# Patient Record
Sex: Male | Born: 1960 | Race: Black or African American | Hispanic: No | Marital: Single | Smoking: Current every day smoker
Health system: Southern US, Community
[De-identification: ages and names within clinical notes are randomized; demographics above are authoritative.]

## PROBLEM LIST (undated history)

## (undated) DIAGNOSIS — G8929 Other chronic pain: Secondary | ICD-10-CM

## (undated) DIAGNOSIS — J449 Chronic obstructive pulmonary disease, unspecified: Secondary | ICD-10-CM

## (undated) DIAGNOSIS — M549 Dorsalgia, unspecified: Secondary | ICD-10-CM

## (undated) DIAGNOSIS — M25569 Pain in unspecified knee: Secondary | ICD-10-CM

## (undated) DIAGNOSIS — F1010 Alcohol abuse, uncomplicated: Secondary | ICD-10-CM

## (undated) DIAGNOSIS — J40 Bronchitis, not specified as acute or chronic: Secondary | ICD-10-CM

## (undated) DIAGNOSIS — I10 Essential (primary) hypertension: Secondary | ICD-10-CM

---

## 1999-04-04 ENCOUNTER — Emergency Department (HOSPITAL_COMMUNITY): Admission: EM | Admit: 1999-04-04 | Discharge: 1999-04-04 | Payer: Self-pay | Admitting: Emergency Medicine

## 2000-04-20 ENCOUNTER — Emergency Department (HOSPITAL_COMMUNITY): Admission: EM | Admit: 2000-04-20 | Discharge: 2000-04-21 | Payer: Self-pay | Admitting: Internal Medicine

## 2001-02-27 ENCOUNTER — Emergency Department (HOSPITAL_COMMUNITY): Admission: EM | Admit: 2001-02-27 | Discharge: 2001-02-27 | Payer: Self-pay | Admitting: Emergency Medicine

## 2001-02-27 ENCOUNTER — Encounter: Payer: Self-pay | Admitting: Emergency Medicine

## 2001-03-06 ENCOUNTER — Emergency Department (HOSPITAL_COMMUNITY): Admission: EM | Admit: 2001-03-06 | Discharge: 2001-03-06 | Payer: Self-pay | Admitting: Emergency Medicine

## 2001-09-29 ENCOUNTER — Emergency Department (HOSPITAL_COMMUNITY): Admission: EM | Admit: 2001-09-29 | Discharge: 2001-09-29 | Payer: Self-pay

## 2002-05-04 ENCOUNTER — Emergency Department (HOSPITAL_COMMUNITY): Admission: EM | Admit: 2002-05-04 | Discharge: 2002-05-04 | Payer: Self-pay | Admitting: Emergency Medicine

## 2002-05-28 ENCOUNTER — Emergency Department (HOSPITAL_COMMUNITY): Admission: EM | Admit: 2002-05-28 | Discharge: 2002-05-29 | Payer: Self-pay | Admitting: Emergency Medicine

## 2002-05-29 ENCOUNTER — Encounter: Payer: Self-pay | Admitting: Emergency Medicine

## 2003-07-18 ENCOUNTER — Emergency Department (HOSPITAL_COMMUNITY): Admission: EM | Admit: 2003-07-18 | Discharge: 2003-07-18 | Payer: Self-pay | Admitting: Emergency Medicine

## 2004-02-19 ENCOUNTER — Emergency Department (HOSPITAL_COMMUNITY): Admission: EM | Admit: 2004-02-19 | Discharge: 2004-02-19 | Payer: Self-pay | Admitting: Emergency Medicine

## 2004-03-02 ENCOUNTER — Emergency Department (HOSPITAL_COMMUNITY): Admission: EM | Admit: 2004-03-02 | Discharge: 2004-03-03 | Payer: Self-pay | Admitting: Emergency Medicine

## 2004-07-31 ENCOUNTER — Emergency Department (HOSPITAL_COMMUNITY): Admission: EM | Admit: 2004-07-31 | Discharge: 2004-07-31 | Payer: Self-pay | Admitting: Emergency Medicine

## 2005-09-17 ENCOUNTER — Emergency Department (HOSPITAL_COMMUNITY): Admission: EM | Admit: 2005-09-17 | Discharge: 2005-09-18 | Payer: Self-pay | Admitting: Emergency Medicine

## 2005-09-19 ENCOUNTER — Emergency Department (HOSPITAL_COMMUNITY): Admission: EM | Admit: 2005-09-19 | Discharge: 2005-09-19 | Payer: Self-pay | Admitting: Family Medicine

## 2006-02-21 ENCOUNTER — Emergency Department (HOSPITAL_COMMUNITY): Admission: EM | Admit: 2006-02-21 | Discharge: 2006-02-21 | Payer: Self-pay | Admitting: Emergency Medicine

## 2006-10-10 ENCOUNTER — Emergency Department (HOSPITAL_COMMUNITY): Admission: EM | Admit: 2006-10-10 | Discharge: 2006-10-10 | Payer: Self-pay | Admitting: Emergency Medicine

## 2006-11-04 ENCOUNTER — Emergency Department (HOSPITAL_COMMUNITY): Admission: EM | Admit: 2006-11-04 | Discharge: 2006-11-04 | Payer: Self-pay | Admitting: Emergency Medicine

## 2008-05-14 ENCOUNTER — Emergency Department (HOSPITAL_COMMUNITY): Admission: EM | Admit: 2008-05-14 | Discharge: 2008-05-14 | Payer: Self-pay | Admitting: Emergency Medicine

## 2009-06-22 ENCOUNTER — Emergency Department (HOSPITAL_COMMUNITY): Admission: EM | Admit: 2009-06-22 | Discharge: 2009-06-22 | Payer: Self-pay | Admitting: Emergency Medicine

## 2009-08-02 ENCOUNTER — Emergency Department (HOSPITAL_COMMUNITY): Admission: EM | Admit: 2009-08-02 | Discharge: 2009-08-02 | Payer: Self-pay | Admitting: Emergency Medicine

## 2010-01-12 ENCOUNTER — Emergency Department (HOSPITAL_COMMUNITY): Admission: EM | Admit: 2010-01-12 | Discharge: 2010-01-12 | Payer: Self-pay | Admitting: Emergency Medicine

## 2010-09-03 LAB — CBC
Hemoglobin: 13 g/dL (ref 13.0–17.0)
MCH: 35.3 pg — ABNORMAL HIGH (ref 26.0–34.0)
MCV: 101.8 fL — ABNORMAL HIGH (ref 78.0–100.0)
RBC: 3.69 MIL/uL — ABNORMAL LOW (ref 4.22–5.81)
RDW: 14.9 % (ref 11.5–15.5)
WBC: 4.6 10*3/uL (ref 4.0–10.5)

## 2010-09-03 LAB — DIFFERENTIAL
Basophils Absolute: 0.1 10*3/uL (ref 0.0–0.1)
Basophils Relative: 1 % (ref 0–1)
Lymphocytes Relative: 48 % — ABNORMAL HIGH (ref 12–46)
Lymphs Abs: 2.2 10*3/uL (ref 0.7–4.0)
Neutro Abs: 1.8 10*3/uL (ref 1.7–7.7)
Neutrophils Relative %: 39 % — ABNORMAL LOW (ref 43–77)

## 2010-09-03 LAB — BASIC METABOLIC PANEL
CO2: 22 mEq/L (ref 19–32)
Calcium: 8.8 mg/dL (ref 8.4–10.5)
Chloride: 110 mEq/L (ref 96–112)
GFR calc non Af Amer: >60 mL/min (ref >60)
Glucose, Bld: 94 mg/dL (ref 70–99)

## 2010-09-03 LAB — ETHANOL: Alcohol, Ethyl (B): 273 mg/dL — ABNORMAL HIGH (ref 0–10)

## 2010-09-04 LAB — RAPID URINE DRUG SCREEN, HOSP PERFORMED
Amphetamines: NOT DETECTED
Barbiturates: NOT DETECTED
Tetrahydrocannabinol: NOT DETECTED

## 2010-09-04 LAB — CBC
HCT: 39.8 % (ref 39.0–52.0)
Hemoglobin: 13.6 g/dL (ref 13.0–17.0)
MCHC: 34.3 g/dL (ref 30.0–36.0)
MCV: 100.3 fL — ABNORMAL HIGH (ref 78.0–100.0)
Platelets: 205 10*3/uL (ref 150–400)

## 2010-09-04 LAB — BASIC METABOLIC PANEL
BUN: 18 mg/dL (ref 6–23)
CO2: 23 mEq/L (ref 19–32)
GFR calc non Af Amer: 48 mL/min — ABNORMAL LOW (ref >60)
Potassium: 5 mEq/L (ref 3.5–5.1)

## 2010-09-04 LAB — DIFFERENTIAL
Basophils Relative: 1 % (ref 0–1)
Eosinophils Relative: 1 % (ref 0–5)
Monocytes Relative: 7 % (ref 3–12)
Neutro Abs: 2.3 10*3/uL (ref 1.7–7.7)
Neutrophils Relative %: 45 % (ref 43–77)

## 2010-09-04 LAB — ETHANOL: Alcohol, Ethyl (B): 289 mg/dL — ABNORMAL HIGH (ref 0–10)

## 2011-01-24 ENCOUNTER — Emergency Department (HOSPITAL_COMMUNITY)
Admission: EM | Admit: 2011-01-24 | Discharge: 2011-01-25 | Disposition: A | Discharge status: Home / Self Care | Payer: Self-pay | Attending: Emergency Medicine | Admitting: Emergency Medicine

## 2011-01-24 DIAGNOSIS — F3289 Other specified depressive episodes: Secondary | ICD-10-CM | POA: Insufficient documentation

## 2011-01-24 DIAGNOSIS — F1010 Alcohol abuse, uncomplicated: Secondary | ICD-10-CM | POA: Insufficient documentation

## 2011-01-24 DIAGNOSIS — F329 Major depressive disorder, single episode, unspecified: Secondary | ICD-10-CM | POA: Insufficient documentation

## 2011-01-24 LAB — COMPREHENSIVE METABOLIC PANEL
ALT: 57 U/L — ABNORMAL HIGH (ref 0–53)
Alkaline Phosphatase: 67 U/L (ref 39–117)
BUN: 8 mg/dL (ref 6–23)
CO2: 27 mEq/L (ref 19–32)
Calcium: 9.1 mg/dL (ref 8.4–10.5)
GFR calc Af Amer: >60 mL/min (ref >60)
GFR calc non Af Amer: >60 mL/min (ref >60)
Glucose, Bld: 90 mg/dL (ref 70–99)
Sodium: 143 mEq/L (ref 135–145)

## 2011-01-24 LAB — ETHANOL: Alcohol, Ethyl (B): 436 mg/dL (ref 0–11)

## 2011-01-24 LAB — DIFFERENTIAL
Basophils Absolute: 0.1 10*3/uL (ref 0.0–0.1)
Basophils Relative: 1 % (ref 0–1)
Eosinophils Absolute: 0.1 10*3/uL (ref 0.0–0.7)
Eosinophils Relative: 2 % (ref 0–5)
Lymphocytes Relative: 50 % — ABNORMAL HIGH (ref 12–46)
Lymphs Abs: 2.4 10*3/uL (ref 0.7–4.0)
Monocytes Absolute: 0.3 10*3/uL (ref 0.1–1.0)
Monocytes Relative: 5 % (ref 3–12)

## 2011-01-24 LAB — CBC
Hemoglobin: 12.9 g/dL — ABNORMAL LOW (ref 13.0–17.0)
MCH: 33.6 pg (ref 26.0–34.0)
MCHC: 34 g/dL (ref 30.0–36.0)
Platelets: 149 10*3/uL — ABNORMAL LOW (ref 150–400)
RDW: 15.2 % (ref 11.5–15.5)

## 2011-01-24 LAB — RAPID URINE DRUG SCREEN, HOSP PERFORMED
Benzodiazepines: NOT DETECTED
Cocaine: NOT DETECTED
Opiates: NOT DETECTED

## 2011-01-31 ENCOUNTER — Encounter: Payer: Self-pay | Admitting: *Deleted

## 2011-01-31 ENCOUNTER — Emergency Department (HOSPITAL_BASED_OUTPATIENT_CLINIC_OR_DEPARTMENT_OTHER)
Admission: EM | Admit: 2011-01-31 | Discharge: 2011-01-31 | Disposition: A | Discharge status: Home / Self Care | Payer: Self-pay | Attending: Emergency Medicine | Admitting: Emergency Medicine

## 2011-01-31 ENCOUNTER — Emergency Department (INDEPENDENT_AMBULATORY_CARE_PROVIDER_SITE_OTHER): Payer: Self-pay

## 2011-01-31 ENCOUNTER — Other Ambulatory Visit: Payer: Self-pay

## 2011-01-31 DIAGNOSIS — R0789 Other chest pain: Secondary | ICD-10-CM

## 2011-01-31 DIAGNOSIS — R55 Syncope and collapse: Secondary | ICD-10-CM

## 2011-01-31 DIAGNOSIS — J45909 Unspecified asthma, uncomplicated: Secondary | ICD-10-CM | POA: Insufficient documentation

## 2011-01-31 LAB — URINALYSIS, ROUTINE W REFLEX MICROSCOPIC
Bilirubin Urine: NEGATIVE
Ketones, ur: NEGATIVE mg/dL
Leukocytes, UA: NEGATIVE
Nitrite: NEGATIVE
Protein, ur: NEGATIVE mg/dL
pH: 7.5 (ref 5.0–8.0)

## 2011-01-31 LAB — BASIC METABOLIC PANEL
BUN: 16 mg/dL (ref 6–23)
Calcium: 9.7 mg/dL (ref 8.4–10.5)
GFR calc non Af Amer: >60 mL/min (ref >60)
Glucose, Bld: 96 mg/dL (ref 70–99)
Sodium: 140 mEq/L (ref 135–145)

## 2011-01-31 LAB — CBC
MCH: 34.4 pg — ABNORMAL HIGH (ref 26.0–34.0)
MCHC: 35.2 g/dL (ref 30.0–36.0)
Platelets: 124 10*3/uL — ABNORMAL LOW (ref 150–400)

## 2011-01-31 LAB — DIFFERENTIAL: Neutrophils Relative %: 48 % (ref 43–77)

## 2011-01-31 LAB — CARDIAC PANEL(CRET KIN+CKTOT+MB+TROPI)
CK, MB: 2.1 ng/mL (ref 0.3–4.0)
Relative Index: INVALID (ref 0.0–2.5)
Total CK: 75 U/L (ref 7–232)
Troponin I: <0.3 ng/mL (ref <0.30)

## 2011-01-31 LAB — GLUCOSE, CAPILLARY

## 2011-01-31 NOTE — ED Notes (Signed)
Ambulated patient approximately 20 20. Patient states that he had dizziness and nausea while ambulating. Patient now resting in bed. Cardiac monitor applied. Call bell within reach. Side rails up x 1.

## 2011-01-31 NOTE — ED Provider Notes (Addendum)
History     CSN: [id number] Arrival date & time: 01/31/2011  7:58 PM  Chief Complaint  Patient presents with  . Near Syncope   Patient is a 50 y.o. male presenting with extremity weakness. The history is provided by the patient.  Extremity Weakness The current episode started 6 to 12 hours ago. Associated symptoms include abdominal pain. Pertinent negatives include no chest pain.   [place] He is currently undergoing treatment for alcohol abuse. The patient states he has been sober for 6 days. He states he began having "heartburn" after eating and did take some Maalox shortly after taking this the patient sat on the couch began to feel weak and states he "almost passed out" he is feeling back to his baseline at this point in time. He denies any chest pain any recent shortness of breath denies any loss of consciousness headache or any focal motor deficit. Patient denies any seizures, or any bowel or bladder incontinence. He states he has been eating well over the past several days Past Medical History  Diagnosis Date  . Asthma     History reviewed. No pertinent past surgical history.  History reviewed. No pertinent family history.  History  Substance Use Topics  . Smoking status: Not on file  . Smokeless tobacco: Not on file  . Alcohol Use: Yes      Review of Systems  Cardiovascular: Negative for chest pain.  Gastrointestinal: Positive for abdominal pain.  Musculoskeletal: Positive for extremity weakness.  All other systems reviewed and are negative.    Physical Exam  BP 127/86  Pulse 78  Temp(Src) 98.9 F (37.2 C) (Oral)  Resp 18  SpO2 100%  Physical Exam  Constitutional: He is oriented to person, place, and time. He appears well-developed and well-nourished.       The patient is sitting upright on the gurney he is in no acute distress he is awake alert and oriented, he appears well hydrated  HENT:  Head: Normocephalic and atraumatic.  Eyes:  Conjunctivae and EOM are normal. Pupils are equal, round, and reactive to light.  Neck: Neck supple.  Cardiovascular: Normal rate and regular rhythm.  Exam reveals no gallop and no friction rub.   No murmur heard. Pulmonary/Chest: Breath sounds normal. He has no wheezes. He has no rales. He exhibits no tenderness.  Abdominal: Soft. Bowel sounds are normal. He exhibits no distension. There is no tenderness. There is no rebound and no guarding.  Musculoskeletal: Normal range of motion.  Neurological: He is alert and oriented to person, place, and time. No cranial nerve deficit. Coordination normal.  Skin: Skin is warm and dry. No rash noted.  Psychiatric: He has a normal mood and affect.    ED Course  Procedures  MDM Pt is seen and examined;  Initial history and physical completed.  Will follow.    Date: 01/31/2011  Rate: 74  Rhythm: normal sinus rhythm  QRS Axis: normal  Intervals: normal  ST/T Wave abnormalities: normal  Conduction Disutrbances:None  Narrative Interpretation:   Old EKG Reviewed: unchanged No significant change is noted J-point elevation in multiple leads likely consistent with early repolarization. No sign of any acute ST segment elevation or depression      Aviana Shevlin A. Patrica Duel, MD 01/31/11 2012  Theron Arista A. Patrica Duel, MD 01/31/11 2013  Theron Arista A. Patrica Duel, MD 01/31/11 2018  Results for orders placed during the hospital encounter of 01/31/11  BASIC METABOLIC PANEL      Component  Value Range   Sodium 140  135 - 145 (mEq/L)   Potassium 3.7  3.5 - 5.1 (mEq/L)   Chloride 102  96 - 112 (mEq/L)   CO2 29  19 - 32 (mEq/L)   Glucose, Bld 96  70 - 99 (mg/dL)   BUN 16  6 - 23 (mg/dL)   Creatinine, Ser 9.14  0.50 - 1.35 (mg/dL)   Calcium 9.7  8.4 - 78.2 (mg/dL)   GFR calc non Af Amer >60  >60 (mL/min)   GFR calc Af Amer >60  >60 (mL/min)  CBC      Component Value Range   WBC 5.6  4.0 - 10.5 (K/uL)   RBC 3.58 (*) 4.22 - 5.81 (MIL/uL)   Hemoglobin 12.3 (*) 13.0 - 17.0  (g/dL)   HCT 95.6 (*) 21.3 - 52.0 (%)   MCV 97.5  78.0 - 100.0 (fL)   MCH 34.4 (*) 26.0 - 34.0 (pg)   MCHC 35.2  30.0 - 36.0 (g/dL)   RDW 08.6  57.8 - 46.9 (%)   Platelets 124 (*) 150 - 400 (K/uL)  DIFFERENTIAL      Component Value Range   Neutrophils Relative 48  43 - 77 (%)   Neutro Abs 2.7  1.7 - 7.7 (K/uL)   Lymphocytes Relative 35  12 - 46 (%)   Lymphs Abs 1.9  0.7 - 4.0 (K/uL)   Monocytes Relative 14 (*) 3 - 12 (%)   Monocytes Absolute 0.8  0.1 - 1.0 (K/uL)   Eosinophils Relative 3  0 - 5 (%)   Eosinophils Absolute 0.1  0.0 - 0.7 (K/uL)   Basophils Relative 0  0 - 1 (%)   Basophils Absolute 0.0  0.0 - 0.1 (K/uL)  GLUCOSE, CAPILLARY      Component Value Range   Glucose-Capillary 94  70 - 99 (mg/dL)   Comment 1 Notify RN     Comment 2 Documented in Chart    CARDIAC PANEL(CRET KIN+CKTOT+MB+TROPI)      Component Value Range   Total CK 75  7 - 232 (U/L)   CK, MB 2.1  0.3 - 4.0 (ng/mL)   Troponin I <0.30  <0.30 (ng/mL)   Relative Index RELATIVE INDEX IS INVALID  0.0 - 2.5   URINALYSIS, ROUTINE W REFLEX MICROSCOPIC      Component Value Range   Color, Urine YELLOW  YELLOW    Appearance CLEAR  CLEAR    Specific Gravity, Urine 1.005  1.005 - 1.030    pH 7.5  5.0 - 8.0    Glucose, UA NEGATIVE  NEGATIVE (mg/dL)   Hgb urine dipstick NEGATIVE  NEGATIVE    Bilirubin Urine NEGATIVE  NEGATIVE    Ketones, ur NEGATIVE  NEGATIVE (mg/dL)   Protein, ur NEGATIVE  NEGATIVE (mg/dL)   Urobilinogen, UA 0.2  0.0 - 1.0 (mg/dL)   Nitrite NEGATIVE  NEGATIVE    Leukocytes, UA NEGATIVE  NEGATIVE    Dg Chest 2 View  01/31/2011  *RADIOLOGY REPORT*  Clinical Data: Near-syncope, left-sided discomfort  CHEST - 2 VIEW  Comparison: None.  Findings: Hyperinflation.  Lungs are clear. No pleural effusion or pneumothorax.  Cardiomediastinal silhouette is within normal limits.  Mild degenerative changes of the visualized thoracolumbar spine.  IMPRESSION: No evidence of acute cardiopulmonary disease.   Hyperinflation.  Original Report Authenticated By: Charline Bills, M.D.      Madisson Kulaga A. Patrica Duel, MD 01/31/11 2205  At this point in time the patient is feeling better he has  received IV fluids. Labs and EKG and x-ray had been reviewed and appear to be within normal limits. Patient will be discharged back today marked at this point in time in stable condition. He is encouraged to return to the ED for any recurring symptoms  Jhana Giarratano A. Patrica Duel, MD 01/31/11 2248

## 2011-01-31 NOTE — ED Notes (Signed)
Pt states that he is being treated at an inpatient facility for alcoholism and this is his 6th day. Patient was sitting down to eat dinner when he felt very dizzy and had brief LOC. Patient states that he "Just remembers them picking me up". Patient states that his head doesn't hurt and has normal pupillary reaction. Patient states no nausea or cardiac history. No startling events or reason to be anxious prior to the LOC that he can think of.

## 2011-01-31 NOTE — ED Notes (Signed)
Reviewed discharge instructions with patient. Patient verbalizes understanding of Lightheadedness and ways to prevent it.

## 2011-01-31 NOTE — ED Notes (Signed)
Pt here from [facility] recovery after near-syncopal episode. Pt became dizzy, diaphoretic, adn had blurred vision. Pt is currently in inpt tx for alcohol detox.

## 2011-03-21 LAB — COMPREHENSIVE METABOLIC PANEL
ALT: 23
AST: 37
Alkaline Phosphatase: 71
CO2: 25
Chloride: 100
GFR calc Af Amer: >60
GFR calc non Af Amer: >60
Glucose, Bld: 105 — ABNORMAL HIGH
Sodium: 134 — ABNORMAL LOW
Total Bilirubin: 0.6

## 2011-03-21 LAB — URINALYSIS, ROUTINE W REFLEX MICROSCOPIC
Hgb urine dipstick: NEGATIVE
Ketones, ur: 15 — AB
Nitrite: NEGATIVE
Specific Gravity, Urine: 1.031 — ABNORMAL HIGH
Urobilinogen, UA: 0.2

## 2011-03-21 LAB — URINE MICROSCOPIC-ADD ON

## 2011-03-21 LAB — DIFFERENTIAL
Basophils Absolute: 0
Eosinophils Absolute: 0.1
Lymphs Abs: 1.1
Monocytes Absolute: 1.1 — ABNORMAL HIGH
Neutrophils Relative %: 54

## 2011-03-21 LAB — CBC
Hemoglobin: 13.1
RBC: 3.78 — ABNORMAL LOW
WBC: 4.9

## 2011-04-30 ENCOUNTER — Other Ambulatory Visit: Payer: Self-pay

## 2011-04-30 ENCOUNTER — Emergency Department (HOSPITAL_COMMUNITY)
Admission: EM | Admit: 2011-04-30 | Discharge: 2011-04-30 | Disposition: A | Discharge status: Home / Self Care | Payer: Self-pay | Attending: Emergency Medicine | Admitting: Emergency Medicine

## 2011-04-30 ENCOUNTER — Encounter (HOSPITAL_COMMUNITY): Payer: Self-pay | Admitting: Emergency Medicine

## 2011-04-30 DIAGNOSIS — J45909 Unspecified asthma, uncomplicated: Secondary | ICD-10-CM | POA: Insufficient documentation

## 2011-04-30 DIAGNOSIS — I10 Essential (primary) hypertension: Secondary | ICD-10-CM | POA: Insufficient documentation

## 2011-04-30 DIAGNOSIS — R5381 Other malaise: Secondary | ICD-10-CM | POA: Insufficient documentation

## 2011-04-30 DIAGNOSIS — R5383 Other fatigue: Secondary | ICD-10-CM | POA: Insufficient documentation

## 2011-04-30 DIAGNOSIS — Z720 Tobacco use: Secondary | ICD-10-CM

## 2011-04-30 DIAGNOSIS — R42 Dizziness and giddiness: Secondary | ICD-10-CM | POA: Insufficient documentation

## 2011-04-30 DIAGNOSIS — F17200 Nicotine dependence, unspecified, uncomplicated: Secondary | ICD-10-CM | POA: Insufficient documentation

## 2011-04-30 HISTORY — DX: Essential (primary) hypertension: I10

## 2011-04-30 HISTORY — DX: Bronchitis, not specified as acute or chronic: J40

## 2011-04-30 LAB — CBC
HCT: 37.1 % — ABNORMAL LOW (ref 39.0–52.0)
Hemoglobin: 13.3 g/dL (ref 13.0–17.0)
MCH: 32 pg (ref 26.0–34.0)
MCHC: 35.8 g/dL (ref 30.0–36.0)
MCV: 89.4 fL (ref 78.0–100.0)
Platelets: 173 10*3/uL (ref 150–400)
RBC: 4.15 MIL/uL — ABNORMAL LOW (ref 4.22–5.81)
RDW: 13.5 % (ref 11.5–15.5)
WBC: 7.5 10*3/uL (ref 4.0–10.5)

## 2011-04-30 LAB — POCT I-STAT, CHEM 8
BUN: 16 mg/dL (ref 6–23)
Calcium, Ion: 1.1 mmol/L — ABNORMAL LOW (ref 1.12–1.32)
Chloride: 106 meq/L (ref 96–112)
Creatinine, Ser: 1 mg/dL (ref 0.50–1.35)
Glucose, Bld: 78 mg/dL (ref 70–99)
HCT: 43 % (ref 39.0–52.0)
Hemoglobin: 14.6 g/dL (ref 13.0–17.0)
Potassium: 4.1 meq/L (ref 3.5–5.1)
Sodium: 140 meq/L (ref 135–145)
TCO2: 23 mmol/L (ref 0–100)

## 2011-04-30 MED ORDER — MECLIZINE HCL 25 MG PO TABS
25.0000 mg | ORAL_TABLET | Freq: Once | ORAL | Status: AC
Start: 2011-04-30 — End: 2011-04-30
  Administered 2011-04-30: 25 mg via ORAL
  Filled 2011-04-30: qty 1

## 2011-04-30 MED ORDER — MECLIZINE HCL 25 MG PO TABS: ORAL_TABLET | ORAL | Status: DC

## 2011-04-30 NOTE — Progress Notes (Signed)
ECG shows normal sinus rhythm with a rate of 63, no ectopy. Normal axis. Normal P wave. Normal QRS. Normal intervals. Normal ST and T waves. Impression: normal ECG. No prior ECG available for comparison.

## 2011-04-30 NOTE — ED Notes (Signed)
Pt reports while eating breakfast this morning felt weakness and fatigue.

## 2011-04-30 NOTE — ED Provider Notes (Signed)
History     CSN: [id number] Arrival date & time: 04/30/2011  1:00 PM   First MD Initiated Contact with Patient 04/30/11 1537      Chief Complaint  Patient presents with  . Weakness  . Dizziness    (Consider location/radiation/quality/duration/timing/severity/associated sxs/prior treatment) HPI  Patient presents to emergency department complaining of acute onset weakness and lightheadedness upon standing from the breakfast table this morning. Patient states since eating breakfast every time he goes from sitting to standing he feels lightheaded "as if I could pass out." Patient states he has felt weak over the last few days but "I just buried my mother and I am extremely stressed." Patient states that while he is sitting still he has no complaints or symptoms. Patient denies headache, fevers, chills, visual changes, stiff neck, chest pain, shortness of breath, loss of coordination, or difficulty in healing. Patient states he has been placed on blood pressure medicine, a sleeping medicine, and acid reflux medicine in the past but has not taken any medication for at least a month due to financial problems and not having a primary care doctor. Patient has history of alcohol abuse but states he only drinks a few drinks "every now and then." Patient smokes tobacco. Patient denies family history of early heart disease. She has not taken anything for symptoms prior to arrival. Symptoms are intermittent but persistent. Symptoms are aggravated by movement and improved with sitting still. Patient states the lightheadedness feels like a pressure in his for head and occipital scalp and has had small amounts of nasal congestion but again denies headache. Patient states he has had waxing and waning mild left ear pain for the last few weeks.  Past Medical History  Diagnosis Date  . Asthma   . Bronchitis   . Hypertension     History reviewed. No pertinent past surgical history.  No family history on  file.  History  Substance Use Topics  . Smoking status: Current Everyday Smoker  . Smokeless tobacco: Not on file  . Alcohol Use: Yes      Review of Systems  All other systems reviewed and are negative.    Allergies  Review of patient's allergies indicates no known allergies.  Home Medications   Current Outpatient Rx  Name Route Sig Dispense Refill  . ALBUTEROL 90 MCG/ACT IN AERS Inhalation Inhale 2 puffs into the lungs every 6 (six) hours as needed. Shortness of breath and wheezing       BP 131/82  Pulse 93  Temp(Src) 98.4 F (36.9 C) (Oral)  Resp 14  SpO2 100%  Physical Exam  Nursing note and vitals reviewed. Constitutional: He is oriented to person, place, and time. He appears well-developed and well-nourished. No distress.  HENT:  Head: Normocephalic and atraumatic.  Right Ear: External ear normal.  Left Ear: External ear normal.  Eyes: Conjunctivae and EOM are normal. Pupils are equal, round, and reactive to light. Right eye exhibits no nystagmus. Left eye exhibits no nystagmus.  Neck: Normal range of motion. Neck supple. Carotid bruit is not present. No Brudzinski's sign and no Kernig's sign noted.  Cardiovascular: Normal rate, regular rhythm, S1 normal, S2 normal, normal heart sounds and intact distal pulses.  Exam reveals no gallop and no friction rub.   No murmur heard. Pulmonary/Chest: Effort normal and breath sounds normal. No respiratory distress. He has no wheezes. He has no rales. He exhibits no tenderness.  Abdominal: Bowel sounds are normal. He exhibits no distension and no mass. There  is no tenderness. There is no rebound and no guarding.  Musculoskeletal: Normal range of motion. He exhibits no edema and no tenderness.  Neurological: He is alert and oriented to person, place, and time. He has normal strength and normal reflexes. No cranial nerve deficit or sensory deficit. He displays a negative Romberg sign. Coordination and gait normal.  Skin: Skin  is warm and dry. No rash noted. He is not diaphoretic. No erythema.  Psychiatric: He has a normal mood and affect.    ED Course  Procedures (including critical care time)  Patient states he is feeling better after PO meclizine  Labs Reviewed  CBC - Abnormal; Notable for the following:    RBC 4.15 (*)    HCT 37.1 (*)    All other components within normal limits  POCT I-STAT, CHEM 8 - Abnormal; Notable for the following:    Calcium, Ion 1.10 (*)    All other components within normal limits  I-STAT, CHEM 8   No results found.   1. Vertigo   2. Tobacco abuse       MDM  Patient is alert and oriented, ambulating without difficulty, no neuro focal findings, no nystagmus or ataxia. No acute findings on labs or EKG. Patient states symptoms have improved after by mouth meclizine. Question peripheral vertigo versus sinus congestion. No signs or symptoms of central vertigo. No orthostatic changes.      Jenness Corner, [place] 04/30/11 [phone]

## 2011-05-01 NOTE — ED Provider Notes (Signed)
I have personally performed and participated in all the services and procedures documented herein. I have reviewed the findings with the patient.   Aubrianne Molyneux, MD 05/01/11 0016 

## 2012-03-14 ENCOUNTER — Observation Stay (HOSPITAL_COMMUNITY)
Admission: EM | Admit: 2012-03-14 | Discharge: 2012-03-15 | Disposition: A | Discharge status: Home Health | Payer: Self-pay | Attending: Emergency Medicine | Admitting: Emergency Medicine

## 2012-03-14 ENCOUNTER — Encounter (HOSPITAL_COMMUNITY): Payer: Self-pay | Admitting: *Deleted

## 2012-03-14 DIAGNOSIS — J40 Bronchitis, not specified as acute or chronic: Secondary | ICD-10-CM | POA: Insufficient documentation

## 2012-03-14 DIAGNOSIS — J45909 Unspecified asthma, uncomplicated: Secondary | ICD-10-CM | POA: Insufficient documentation

## 2012-03-14 DIAGNOSIS — F17200 Nicotine dependence, unspecified, uncomplicated: Secondary | ICD-10-CM | POA: Insufficient documentation

## 2012-03-14 DIAGNOSIS — R0602 Shortness of breath: Secondary | ICD-10-CM | POA: Insufficient documentation

## 2012-03-14 DIAGNOSIS — Z9181 History of falling: Secondary | ICD-10-CM | POA: Insufficient documentation

## 2012-03-14 DIAGNOSIS — M5136 Other intervertebral disc degeneration, lumbar region: Secondary | ICD-10-CM

## 2012-03-14 DIAGNOSIS — I10 Essential (primary) hypertension: Secondary | ICD-10-CM | POA: Insufficient documentation

## 2012-03-14 DIAGNOSIS — M79609 Pain in unspecified limb: Secondary | ICD-10-CM | POA: Insufficient documentation

## 2012-03-14 DIAGNOSIS — M549 Dorsalgia, unspecified: Principal | ICD-10-CM | POA: Insufficient documentation

## 2012-03-14 NOTE — ED Notes (Addendum)
Pt c/o leg pain, back pain and sob for several months.  Per ems, lung sounds clear.  He called EMS tonight b/c he finally "got tired of dealing with it" and decided to call EMS.  ETOH on board.

## 2012-03-14 NOTE — ED Notes (Signed)
Pt states he has hx of bronchitis, feels SOB-no distress.  Also states "my knees have been giving out".  Fell x 2 today, pt states no injuries from fall, no LOC.  Also c/o back pain.

## 2012-03-15 ENCOUNTER — Observation Stay (HOSPITAL_COMMUNITY): Payer: Self-pay

## 2012-03-15 MED ORDER — ALBUTEROL SULFATE (5 MG/ML) 0.5% IN NEBU
5.0000 mg | INHALATION_SOLUTION | Freq: Once | RESPIRATORY_TRACT | Status: AC
  Administered 2012-03-15: 5 mg via RESPIRATORY_TRACT
  Filled 2012-03-15: qty 1

## 2012-03-15 MED ORDER — MORPHINE SULFATE 4 MG/ML IJ SOLN: 4.0000 mg | INTRAMUSCULAR | Status: DC | PRN

## 2012-03-15 MED ORDER — ALBUTEROL SULFATE HFA 108 (90 BASE) MCG/ACT IN AERS
1.0000 | INHALATION_SPRAY | Freq: Four times a day (QID) | RESPIRATORY_TRACT | Status: DC | PRN
  Filled 2012-03-15: qty 6.7

## 2012-03-15 MED ORDER — IPRATROPIUM BROMIDE 0.02 % IN SOLN
0.5000 mg | Freq: Once | RESPIRATORY_TRACT | Status: AC
  Administered 2012-03-15: 0.5 mg via RESPIRATORY_TRACT
  Filled 2012-03-15 (×2): qty 2.5

## 2012-03-15 MED ORDER — KETOROLAC TROMETHAMINE 30 MG/ML IJ SOLN: 30.0000 mg | Freq: Four times a day (QID) | INTRAMUSCULAR | Status: DC | PRN

## 2012-03-15 MED ORDER — CYCLOBENZAPRINE HCL 10 MG PO TABS: 10.0000 mg | ORAL_TABLET | Freq: Two times a day (BID) | ORAL | Status: DC | PRN

## 2012-03-15 MED ORDER — ALBUTEROL SULFATE HFA 108 (90 BASE) MCG/ACT IN AERS: 1.0000 | INHALATION_SPRAY | Freq: Four times a day (QID) | RESPIRATORY_TRACT | Status: DC | PRN

## 2012-03-15 MED ORDER — ONDANSETRON HCL 4 MG/2ML IJ SOLN: 4.0000 mg | Freq: Four times a day (QID) | INTRAMUSCULAR | Status: DC | PRN

## 2012-03-15 MED ORDER — HYDROCODONE-ACETAMINOPHEN 5-325 MG PO TABS: 1.0000 | ORAL_TABLET | Freq: Four times a day (QID) | ORAL | Status: DC | PRN

## 2012-03-15 NOTE — ED Provider Notes (Signed)
History     CSN: [id number]  Arrival date & time 03/14/12  2317   First MD Initiated Contact with Patient 03/15/12 0205      Chief Complaint  Patient presents with  . Leg Pain  . Back Pain    (Consider location/radiation/quality/duration/timing/severity/associated sxs/prior treatment) HPI History provided by pt.  Pt a poor historian.  Has had diffuse, bilateral LE pain that radiates to his lower back for the past year.  Associated w/ weakness, intermittent giving way of LEs, usually the right and paresthesias.  His knees buckle.  No associated fever, neck pain, bowel/bladder dysfunction.  Over the past month, his symptoms have worsened.  One of his legs give way on a daily basis causing him to fall.  His pain is worst in his low back but right knee a close second.  Has not taken anything for pain.  Has never been evaluated for this problem.  Denies trauma.  Denies IV drug abuse and is immunocompetent.  Also c/o chronic SOB that is aggravated by exertion, with associated wheezing and coughing.  Has a h/o asthma and bronchitis but sx are usually not this severe.  Ran out of his albuterol a long time ago.  Denies chest pain.  No FH of early MI.  No RF for PE.    Past Medical History  Diagnosis Date  . Asthma   . Bronchitis   . Hypertension     History reviewed. No pertinent past surgical history.  No family history on file.  History  Substance Use Topics  . Smoking status: Current Every Day Smoker -- 1.0 packs/day    Types: Cigarettes  . Smokeless tobacco: Not on file  . Alcohol Use: 16.8 oz/week    28 Cans of beer per week      Review of Systems  All other systems reviewed and are negative.    Allergies  Review of patient's allergies indicates no known allergies.  Home Medications   Current Outpatient Rx  Name Route Sig Dispense Refill  . ALBUTEROL 90 MCG/ACT IN AERS Inhalation Inhale 2 puffs into the lungs every 6 (six) hours as needed. Shortness of breath and  wheezing       BP 115/82  Temp 98.3 F (36.8 C) (Oral)  Resp 18  SpO2 100%  Physical Exam  Nursing note and vitals reviewed. Constitutional: He is oriented to person, place, and time. He appears well-developed and well-nourished.  HENT:  Head: Normocephalic and atraumatic.  Eyes:       Normal appearance  Neck: Normal range of motion.  Cardiovascular: Normal rate and regular rhythm.   Pulmonary/Chest: Effort normal and breath sounds normal.  Genitourinary:       No CVA ttp  Musculoskeletal:       Entire low back ttp.  Pt guards w/ palpation of mid-line lumbar spine.  Pt appears to struggle with active ROM of LEs but it is difficult to discern whether this is secondary to pain or weakness.  Has symmetric but decreased strength in all muscle groups.  Nml patellar reflexes.  No saddle anesthesia. Distal sensation intact.  2+ DP pulses.   Neurological: He is alert and oriented to person, place, and time.  Skin: Skin is warm and dry. No rash noted.  Psychiatric: He has a normal mood and affect. His behavior is normal.    ED Course  Procedures (including critical care time)  Labs Reviewed - No data to display No results found.   No diagnosis found.  MDM  51yo M presents w/ low back and LE pain with multiple falls secondary to LE weakness/buckling of knees, particularly the right.  On exam, pt is afebrile, diffuse low back tenderness but guarding w/ palpation of mid-line lumbar spine, decreased but symmetric strength in all muscle groups LEs, nml reflexes and NV intact.  Pt very dependent on myself and nurse when walking.  He reports that this is abnormal for him; often takes walks [place].  Xray lumbar spine and right knee are neg.  Pt to be placed on back pain protocol in CDU and have MRI in the morning.  Also c/o worse than baseline SOB as well as wheezing and cough.  Has h/o asthma and bronchitis.  CXR neg.  Pt received a breathing tmnt and  reports that his symptoms are much improved.  He has no concerns or complaints at this time.  5:27 AM         Otilio Miu, PA 03/19/12 [phone]

## 2012-03-15 NOTE — ED Notes (Signed)
Attempted to call report to CDU, nurse has reached her max on pts.

## 2012-03-15 NOTE — ED Provider Notes (Signed)
Pt here with complaint of back pain for several months.  Pt without PCP or outpatient follow up  No distress this am .  No complaints.  Back pain better. Filed Vitals:   03/15/12 0730  BP: 116/76  Pulse: 68  Temp:   Resp: 20   MRI reviewed.  No lesion that would reauire emergent treatment.  Will dc home to follow up with neurosurgery.  May benefit from epidural injections.  Pt requested albuterol mdi.  Celene Kras, MD 03/15/12 [phone]

## 2012-03-15 NOTE — ED Notes (Signed)
MD at bedside. 

## 2012-03-22 ENCOUNTER — Encounter (HOSPITAL_COMMUNITY): Payer: Self-pay | Admitting: Emergency Medicine

## 2012-03-22 ENCOUNTER — Emergency Department (HOSPITAL_COMMUNITY): Payer: Self-pay

## 2012-03-22 ENCOUNTER — Emergency Department (HOSPITAL_COMMUNITY)
Admission: EM | Admit: 2012-03-22 | Discharge: 2012-03-22 | Disposition: A | Discharge status: Home / Self Care | Payer: Self-pay | Attending: Emergency Medicine | Admitting: Emergency Medicine

## 2012-03-22 DIAGNOSIS — M79672 Pain in left foot: Secondary | ICD-10-CM

## 2012-03-22 DIAGNOSIS — F17200 Nicotine dependence, unspecified, uncomplicated: Secondary | ICD-10-CM | POA: Insufficient documentation

## 2012-03-22 DIAGNOSIS — I10 Essential (primary) hypertension: Secondary | ICD-10-CM | POA: Insufficient documentation

## 2012-03-22 DIAGNOSIS — J45909 Unspecified asthma, uncomplicated: Secondary | ICD-10-CM | POA: Insufficient documentation

## 2012-03-22 DIAGNOSIS — IMO0002 Reserved for concepts with insufficient information to code with codable children: Secondary | ICD-10-CM | POA: Insufficient documentation

## 2012-03-22 DIAGNOSIS — S8990XA Unspecified injury of unspecified lower leg, initial encounter: Secondary | ICD-10-CM | POA: Insufficient documentation

## 2012-03-22 MED ORDER — HYDROCODONE-ACETAMINOPHEN 5-325 MG PO TABS: 1.0000 | ORAL_TABLET | Freq: Four times a day (QID) | ORAL | Status: DC | PRN

## 2012-03-22 MED ORDER — ALBUTEROL SULFATE HFA 108 (90 BASE) MCG/ACT IN AERS
1.0000 | INHALATION_SPRAY | Freq: Four times a day (QID) | RESPIRATORY_TRACT | Status: DC | PRN
  Administered 2012-03-22: 2 via RESPIRATORY_TRACT
  Filled 2012-03-22: qty 6.7
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
## 2012-03-22 NOTE — ED Notes (Signed)
MD at bedside. 

## 2012-03-22 NOTE — ED Notes (Signed)
Patient transported to X-ray 

## 2012-04-09 NOTE — ED Provider Notes (Signed)
Medical screening examination/treatment/procedure(s) were performed by non-physician practitioner and as supervising physician I was immediately available for consultation/collaboration.  Omarrion Carmer Lytle Michaels, MD 04/09/12 [phone]

## 2012-12-31 ENCOUNTER — Ambulatory Visit: Payer: Self-pay | Attending: Family Medicine | Admitting: Family Medicine

## 2012-12-31 VITALS — BP 115/75 | HR 80 | Temp 98.7°F | Resp 18 | Ht 71.0 in | Wt 114.2 lb

## 2012-12-31 DIAGNOSIS — J4520 Mild intermittent asthma, uncomplicated: Secondary | ICD-10-CM

## 2012-12-31 DIAGNOSIS — J45909 Unspecified asthma, uncomplicated: Secondary | ICD-10-CM | POA: Insufficient documentation

## 2012-12-31 DIAGNOSIS — M25562 Pain in left knee: Secondary | ICD-10-CM

## 2012-12-31 DIAGNOSIS — D509 Iron deficiency anemia, unspecified: Secondary | ICD-10-CM | POA: Insufficient documentation

## 2012-12-31 DIAGNOSIS — M25569 Pain in unspecified knee: Secondary | ICD-10-CM

## 2012-12-31 DIAGNOSIS — M549 Dorsalgia, unspecified: Secondary | ICD-10-CM | POA: Insufficient documentation

## 2012-12-31 DIAGNOSIS — M25561 Pain in right knee: Secondary | ICD-10-CM | POA: Insufficient documentation

## 2012-12-31 DIAGNOSIS — G8929 Other chronic pain: Secondary | ICD-10-CM | POA: Insufficient documentation

## 2012-12-31 DIAGNOSIS — F17200 Nicotine dependence, unspecified, uncomplicated: Secondary | ICD-10-CM | POA: Insufficient documentation

## 2012-12-31 LAB — CBC
MCH: 33.4 pg (ref 26.0–34.0)
MCHC: 33.8 g/dL (ref 30.0–36.0)
MCV: 98.9 fL (ref 78.0–100.0)
Platelets: 186 10*3/uL (ref 150–400)
RBC: 3.77 MIL/uL — ABNORMAL LOW (ref 4.22–5.81)
RDW: 14 % (ref 11.5–15.5)

## 2012-12-31 LAB — POCT GLYCOSYLATED HEMOGLOBIN (HGB A1C): Hemoglobin A1C: 5.1

## 2012-12-31 MED ORDER — ALBUTEROL SULFATE HFA 108 (90 BASE) MCG/ACT IN AERS: 2.0000 | INHALATION_SPRAY | Freq: Four times a day (QID) | RESPIRATORY_TRACT | Status: DC | PRN

## 2012-12-31 NOTE — Patient Instructions (Addendum)
Back Exercises Back exercises help treat and prevent back injuries. The goal of back exercises is to increase the strength of your abdominal and back muscles and the flexibility of your back. These exercises should be started when you no longer have back pain. Back exercises include:  Pelvic Tilt. Lie on your back with your knees bent. Tilt your pelvis until the lower part of your back is against the floor. Hold this position 5 to 10 sec and repeat 5 to 10 times.  Knee to Chest. Pull first 1 knee up against your chest and hold for 20 to 30 seconds, repeat this with the other knee, and then both knees. This may be done with the other leg straight or bent, whichever feels better.  Sit-Ups or Curl-Ups. Bend your knees 90 degrees. Start with tilting your pelvis, and do a partial, slow sit-up, lifting your trunk only 30 to 45 degrees off the floor. Take at least 2 to 3 seconds for each sit-up. Do not do sit-ups with your knees out straight. If partial sit-ups are difficult, simply do the above but with only tightening your abdominal muscles and holding it as directed.  Hip-Lift. Lie on your back with your knees flexed 90 degrees. Push down with your feet and shoulders as you raise your hips a couple inches off the floor; hold for 10 seconds, repeat 5 to 10 times.  Back arches. Lie on your stomach, propping yourself up on bent elbows. Slowly press on your hands, causing an arch in your low back. Repeat 3 to 5 times. Any initial stiffness and discomfort should lessen with repetition over time.  Shoulder-Lifts. Lie face down with arms beside your body. Keep hips and torso pressed to floor as you slowly lift your head and shoulders off the floor. Do not overdo your exercises, especially in the beginning. Exercises may cause you some mild back discomfort which lasts for a few minutes; however, if the pain is more severe, or lasts for more than 15 minutes, do not continue exercises until you see your caregiver.  Improvement with exercise therapy for back problems is slow.  See your caregivers for assistance with developing a proper back exercise program. Document Released: 07/13/2004 Document Revised: 08/28/2011 Document Reviewed: 04/06/2011 [facility] Patient Information 2014 [place], [place]. Back Pain, Adult Low back pain is very common. About 1 in 5 people have back pain.The cause of low back pain is rarely dangerous. The pain often gets better over time.About half of people with a sudden onset of back pain feel better in just 2 weeks. About 8 in 10 people feel better by 6 weeks.  CAUSES Some common causes of back pain include:  Strain of the muscles or ligaments supporting the spine.  Wear and tear (degeneration) of the spinal discs.  Arthritis.  Direct injury to the back. DIAGNOSIS Most of the time, the direct cause of low back pain is not known.However, back pain can be treated effectively even when the exact cause of the pain is unknown.Answering your caregiver's questions about your overall health and symptoms is one of the most accurate ways to make sure the cause of your pain is not dangerous. If your caregiver needs more information, he or she may order lab work or imaging tests (X-rays or MRIs).However, even if imaging tests show changes in your back, this usually does not require surgery. HOME CARE INSTRUCTIONS For many people, back pain returns.Since low back pain is rarely dangerous, it is often a condition that people can  learn to [facility] their own.   Remain active. It is stressful on the back to sit or stand in one place. Do not sit, drive, or stand in one place for more than 30 minutes at a time. Take short walks on level surfaces as soon as pain allows.Try to increase the length of time you walk each day.  Do not stay in bed.Resting more than 1 or 2 days can delay your recovery.  Do not avoid exercise or work.Your body is made to move.It is not dangerous to be active,  even though your back may hurt.Your back will likely heal faster if you return to being active before your pain is gone.  Pay attention to your body when you bend and lift. Many people have less discomfortwhen lifting if they bend their knees, keep the load close to their bodies,and avoid twisting. Often, the most comfortable positions are those that put less stress on your recovering back.  Find a comfortable position to sleep. Use a firm mattress and lie on your side with your knees slightly bent. If you lie on your back, put a pillow under your knees.  Only take over-the-counter or prescription medicines as directed by your caregiver. Over-the-counter medicines to reduce pain and inflammation are often the most helpful.Your caregiver may prescribe muscle relaxant drugs.These medicines help dull your pain so you can more quickly return to your normal activities and healthy exercise.  Put ice on the injured area.  Put ice in a plastic bag.  Place a towel between your skin and the bag.  Leave the ice on for 15-20 minutes, 3-4 times a day for the first 2 to 3 days. After that, ice and heat may be alternated to reduce pain and spasms.  Ask your caregiver about trying back exercises and gentle massage. This may be of some benefit.  Avoid feeling anxious or stressed.Stress increases muscle tension and can worsen back pain.It is important to recognize when you are anxious or stressed and learn ways to manage it.Exercise is a great option. SEEK MEDICAL CARE IF:  You have pain that is not relieved with rest or medicine.  You have pain that does not improve in 1 week.  You have new symptoms.  You are generally not feeling well. SEEK IMMEDIATE MEDICAL CARE IF:   You have pain that radiates from your back into your legs.  You develop new bowel or bladder control problems.  You have unusual weakness or numbness in your arms or legs.  You develop nausea or vomiting.  You develop  abdominal pain.  You feel faint. Document Released: 06/05/2005 Document Revised: 12/05/2011 Document Reviewed: 10/24/2010 [facility] Patient Information 2014 [place], [place]. Hypertension As your heart beats, it forces blood through your arteries. This force is your blood pressure. If the pressure is too high, it is called hypertension (HTN) or high blood pressure. HTN is dangerous because you may have it and not know it. High blood pressure may mean that your heart has to work harder to pump blood. Your arteries may be narrow or stiff. The extra work puts you at risk for heart disease, stroke, and other problems.  Blood pressure consists of two numbers, a higher number over a lower, 110/72, for example. It is stated as "110 over 72." The ideal is below 120 for the top number (systolic) and under 80 for the bottom (diastolic). Write down your blood pressure today. You should pay close attention to your blood pressure if you have certain conditions  such as:  Heart failure.  Prior heart attack.  Diabetes  Chronic kidney disease.  Prior stroke.  Multiple risk factors for heart disease. To see if you have HTN, your blood pressure should be measured while you are seated with your arm held at the level of the heart. It should be measured at least twice. A one-time elevated blood pressure reading (especially in the Emergency Department) does not mean that you need treatment. There may be conditions in which the blood pressure is different between your right and left arms. It is important to see your caregiver soon for a recheck. Most people have essential hypertension which means that there is not a specific cause. This type of high blood pressure may be lowered by changing lifestyle factors such as:  Stress.  Smoking.  Lack of exercise.  Excessive weight.  Drug/tobacco/alcohol use.  Eating less salt. Most people do not have symptoms from high blood pressure until it has caused damage to  the body. Effective treatment can often prevent, delay or reduce that damage. TREATMENT  When a cause has been identified, treatment for high blood pressure is directed at the cause. There are a large number of medications to treat HTN. These fall into several categories, and your caregiver will help you select the medicines that are best for you. Medications may have side effects. You should review side effects with your caregiver. If your blood pressure stays high after you have made lifestyle changes or started on medicines,   Your medication(s) may need to be changed.  Other problems may need to be addressed.  Be certain you understand your prescriptions, and know how and when to take your medicine.  Be sure to follow up with your caregiver within the time frame advised (usually within two weeks) to have your blood pressure rechecked and to review your medications.  If you are taking more than one medicine to lower your blood pressure, make sure you know how and at what times they should be taken. Taking two medicines at the same time can result in blood pressure that is too low. SEEK IMMEDIATE MEDICAL CARE IF:  You develop a severe headache, blurred or changing vision, or confusion.  You have unusual weakness or numbness, or a faint feeling.  You have severe chest or abdominal pain, vomiting, or breathing problems. MAKE SURE YOU:   Understand these instructions.  Will watch your condition.  Will get help right away if you are not doing well or get worse. Document Released: 06/05/2005 Document Revised: 08/28/2011 Document Reviewed: 01/24/2008 [facility] Patient Information 2014 [place], [place].

## 2012-12-31 NOTE — Progress Notes (Signed)
Pt here to establish care with Hx. Asthma,Bronchitis, and Arthritis of both knees. Pt reports to knees buckling daily close to falling. Only taking albuterol inhaler as needed.vss

## 2012-12-31 NOTE — Progress Notes (Signed)
Patient ID: Jaime Cline, male   DOB: 08-01-1960, 52 y.o.   MRN: [id number]  CC:  Knee pain   HPI: Pt reports chronic knee and back pain and reports that he has not been able to follow up with orthopedics as he had been told over 8  Months ago.     No Known Allergies Past Medical History  Diagnosis Date  . Asthma   . Bronchitis   . Hypertension   . Bronchitis    Current Outpatient Prescriptions on File Prior to Visit  Medication Sig Dispense Refill  . [DISCONTINUED] albuterol (PROVENTIL,VENTOLIN) 90 MCG/ACT inhaler Inhale 2 puffs into the lungs every 6 (six) hours as needed. Shortness of breath and wheezing        No current facility-administered medications on file prior to visit.   Family History  Problem Relation Age of Onset  . Cancer Mother   . Cancer Sister    History   Social History  . Marital Status: Single    Spouse Name: N/A    Number of Children: N/A  . Years of Education: N/A   Occupational History  . Not on file.   Social History Main Topics  . Smoking status: Current Every Day Smoker -- 1.00 packs/day    Types: Cigarettes  . Smokeless tobacco: Not on file  . Alcohol Use: 16.8 oz/week    28 Cans of beer per week  . Drug Use: No  . Sexually Active: Not on file   Other Topics Concern  . Not on file   Social History Narrative  . No narrative on file    Review of Systems  Constitutional: Negative for fever, chills, diaphoresis, activity change, appetite change and fatigue.  HENT: Negative for ear pain, nosebleeds, congestion, facial swelling, rhinorrhea, neck pain, neck stiffness and ear discharge.   Eyes: Negative for pain, discharge, redness, itching and visual disturbance.  Respiratory: Negative for cough, choking, chest tightness, shortness of breath, wheezing and stridor.   Cardiovascular: Negative for chest pain, palpitations and leg swelling.  Gastrointestinal: Negative for abdominal distention.  Genitourinary: Negative for dysuria, urgency,  frequency, hematuria, flank pain, decreased urine volume, difficulty urinating and dyspareunia.  Musculoskeletal: chronic low back pain, Negative joint swelling, arthralgias and gait problem.  Neurological: Negative for dizziness, tremors, seizures, syncope, facial asymmetry, speech difficulty, weakness, light-headedness, numbness and headaches.  Hematological: Negative for adenopathy. Does not bruise/bleed easily.  Psychiatric/Behavioral: Negative for hallucinations, behavioral problems, confusion, dysphoric mood, decreased concentration and agitation.    Objective:   Filed Vitals:   12/31/12 1627  BP: 115/75  Pulse: 80  Temp: 98.7 F (37.1 C)  Resp: 18    Physical Exam  Constitutional: thin male, Appears well-developed and well-nourished. No distress.  HENT: Normocephalic. External right and left ear normal. Oropharynx is clear and moist.  Eyes: Conjunctivae and EOM are normal. PERRLA, no scleral icterus.  Neck: Normal ROM. Neck supple. No JVD. No tracheal deviation. No thyromegaly.  CVS: RRR, S1/S2 +, no murmurs, no gallops, no carotid bruit.  Pulmonary: Effort and breath sounds normal, no stridor, rhonchi, wheezes, rales.  Abdominal: Soft. BS +,  no distension, tenderness, rebound or guarding.  Musculoskeletal: pain and tenderness of lumbar and thoracic spine with palpation.  Lymphadenopathy: No lymphadenopathy noted, cervical, inguinal. Neuro: Alert. Normal reflexes, muscle tone coordination. No cranial nerve deficit. Skin: Skin is warm and dry. No rash noted. Not diaphoretic. No erythema. No pallor.  Psychiatric: Normal mood and affect. Behavior, judgment, thought content normal.  Lab Results  Component Value Date   WBC 7.5 04/30/2011   HGB 14.6 04/30/2011   HCT 43.0 04/30/2011   MCV 89.4 04/30/2011   PLT 173 04/30/2011   Lab Results  Component Value Date   CREATININE 1.00 04/30/2011   BUN 16 04/30/2011   NA 140 04/30/2011   K 4.1 04/30/2011   CL 106 04/30/2011    CO2 29 01/31/2011    Lab Results  Component Value Date   HGBA1C 5.1% 12/31/2012   Lipid Panel  No results found for this basename: chol, trig, hdl, cholhdl, vldl, ldlcalc       Assessment and plan:   Patient Active Problem List   Diagnosis Date Noted  . Asthma, chronic 12/31/2012  . Smoker 12/31/2012  . Knee pain, bilateral 12/31/2012  . Iron deficiency anemia 12/31/2012  . Knee pain, chronic 12/31/2012   Iron deficiency anemia - Plan: HgB A1c, Glucose (CBG), Ambulatory referral to Sports Medicine, CBC, COMPLETE METABOLIC PANEL WITH GFR  Knee pain, chronic, unspecified laterality - Plan: Ambulatory referral to Sports Medicine, CBC, COMPLETE METABOLIC PANEL WITH GFR  Asthma, chronic, mild intermittent, uncomplicated - Plan: CBC, COMPLETE METABOLIC PANEL WITH GFR  Smoker - Plan: CBC, COMPLETE METABOLIC PANEL WITH GFR  Knee pain, bilateral - Plan: CBC, COMPLETE METABOLIC PANEL WITH GFR   Meet with eligibility specialist about [facility] card  The patient was counseled on the dangers of tobacco use, and was advised to quit.  Reviewed strategies to maximize success, including removing cigarettes and smoking materials from environment.  The patient was given clear instructions to go to ER or return to medical center if symptoms don't improve, worsen or new problems develop.  The patient verbalized understanding.  The patient was told to call to get any lab results if not heard anything in the next week.    Follow up in 2 months   Rodney Langton, MD, CDE, FAAFP [facility] [place], [place]

## 2013-01-01 ENCOUNTER — Telehealth: Payer: Self-pay

## 2013-01-01 LAB — LIPID PANEL
HDL: 83 mg/dL (ref >39)
LDL Cholesterol: 53 mg/dL (ref 0–99)
Total CHOL/HDL Ratio: 1.9 Ratio
Triglycerides: 97 mg/dL (ref <150)

## 2013-01-01 LAB — COMPLETE METABOLIC PANEL WITH GFR
ALT: 27 U/L (ref 0–53)
AST: 42 U/L — ABNORMAL HIGH (ref 0–37)
Alkaline Phosphatase: 46 U/L (ref 39–117)
Creat: 0.93 mg/dL (ref 0.50–1.35)
GFR, Est African American: >89 mL/min
Sodium: 135 mEq/L (ref 135–145)
Total Bilirubin: 0.4 mg/dL (ref 0.3–1.2)
Total Protein: 6.9 g/dL (ref 6.0–8.3)

## 2013-01-01 NOTE — Telephone Encounter (Signed)
Tried to call patient ] Phone number is invalid

## 2013-01-01 NOTE — Telephone Encounter (Signed)
Message copied by Lestine Mount on Wed Jan 01, 2013  9:18 AM ------      Message from: Cleora Fleet      Created: Wed Jan 01, 2013  8:51 AM       Please inform patient that labs came back okay except that one of the liver enzyme tests was slightly abnormal and the hemoglobin was slightly low at 12.6.  Normal would be 13.  Other lab tests came back okay.            Rodney Langton, MD, CDE, FAAFP      [facility]      [facility]      [place], [place]        ------

## 2013-01-01 NOTE — Progress Notes (Signed)
Quick Note:  Please inform patient that labs came back okay except that one of the liver enzyme tests was slightly abnormal and the hemoglobin was slightly low at 12.6. Normal would be 13. Other lab tests came back okay.  Rodney Langton, MD, CDE, FAAFP [facility] [place], [place]   ______

## 2013-04-02 ENCOUNTER — Ambulatory Visit: Payer: Self-pay

## 2013-09-06 ENCOUNTER — Encounter (HOSPITAL_COMMUNITY): Payer: Self-pay | Admitting: Emergency Medicine

## 2013-09-06 ENCOUNTER — Emergency Department (HOSPITAL_COMMUNITY)
Admission: EM | Admit: 2013-09-06 | Discharge: 2013-09-06 | Disposition: A | Discharge status: Home / Self Care | Payer: Self-pay | Attending: Emergency Medicine | Admitting: Emergency Medicine

## 2013-09-06 ENCOUNTER — Emergency Department (HOSPITAL_COMMUNITY): Payer: Self-pay

## 2013-09-06 DIAGNOSIS — R51 Headache: Secondary | ICD-10-CM | POA: Insufficient documentation

## 2013-09-06 DIAGNOSIS — H5500 Unspecified nystagmus: Secondary | ICD-10-CM | POA: Insufficient documentation

## 2013-09-06 DIAGNOSIS — F17200 Nicotine dependence, unspecified, uncomplicated: Secondary | ICD-10-CM | POA: Insufficient documentation

## 2013-09-06 DIAGNOSIS — R251 Tremor, unspecified: Secondary | ICD-10-CM

## 2013-09-06 DIAGNOSIS — R011 Cardiac murmur, unspecified: Secondary | ICD-10-CM | POA: Insufficient documentation

## 2013-09-06 DIAGNOSIS — J45909 Unspecified asthma, uncomplicated: Secondary | ICD-10-CM | POA: Insufficient documentation

## 2013-09-06 DIAGNOSIS — R42 Dizziness and giddiness: Secondary | ICD-10-CM | POA: Insufficient documentation

## 2013-09-06 DIAGNOSIS — R259 Unspecified abnormal involuntary movements: Secondary | ICD-10-CM | POA: Insufficient documentation

## 2013-09-06 DIAGNOSIS — I10 Essential (primary) hypertension: Secondary | ICD-10-CM | POA: Insufficient documentation

## 2013-09-06 LAB — CBC WITH DIFFERENTIAL/PLATELET
BASOS ABS: 0.1 10*3/uL (ref 0.0–0.1)
BASOS PCT: 1 % (ref 0–1)
EOS ABS: 0 10*3/uL (ref 0.0–0.7)
Eosinophils Relative: 0 % (ref 0–5)
HCT: 38.7 % — ABNORMAL LOW (ref 39.0–52.0)
Hemoglobin: 14.1 g/dL (ref 13.0–17.0)
Lymphocytes Relative: 23 % (ref 12–46)
Lymphs Abs: 1.3 10*3/uL (ref 0.7–4.0)
MCH: 35.1 pg — ABNORMAL HIGH (ref 26.0–34.0)
MCHC: 36.4 g/dL — AB (ref 30.0–36.0)
MCV: 96.3 fL (ref 78.0–100.0)
MONO ABS: 0.7 10*3/uL (ref 0.1–1.0)
Monocytes Relative: 13 % — ABNORMAL HIGH (ref 3–12)
NEUTROS ABS: 3.4 10*3/uL (ref 1.7–7.7)
NEUTROS PCT: 63 % (ref 43–77)
Platelets: 217 10*3/uL (ref 150–400)
RBC: 4.02 MIL/uL — ABNORMAL LOW (ref 4.22–5.81)
RDW: 15.4 % (ref 11.5–15.5)
WBC: 5.4 10*3/uL (ref 4.0–10.5)

## 2013-09-06 LAB — COMPREHENSIVE METABOLIC PANEL
ALBUMIN: 4.4 g/dL (ref 3.5–5.2)
ALT: 28 U/L (ref 0–53)
AST: 56 U/L — AB (ref 0–37)
Alkaline Phosphatase: 62 U/L (ref 39–117)
BILIRUBIN TOTAL: 0.5 mg/dL (ref 0.3–1.2)
BUN: 18 mg/dL (ref 6–23)
CHLORIDE: 98 meq/L (ref 96–112)
CO2: 23 mEq/L (ref 19–32)
CREATININE: 0.76 mg/dL (ref 0.50–1.35)
Calcium: 9.7 mg/dL (ref 8.4–10.5)
GFR calc Af Amer: >90 mL/min (ref >90)
GFR calc non Af Amer: >90 mL/min (ref >90)
Glucose, Bld: 96 mg/dL (ref 70–99)
Potassium: 4.1 mEq/L (ref 3.7–5.3)
Sodium: 137 mEq/L (ref 137–147)
TOTAL PROTEIN: 7.7 g/dL (ref 6.0–8.3)

## 2013-09-06 MED ORDER — LORAZEPAM 1 MG PO TABS: 0.5000 mg | ORAL_TABLET | Freq: Three times a day (TID) | ORAL | Status: DC | PRN

## 2013-09-06 MED ORDER — SODIUM CHLORIDE 0.9 % IV BOLUS (SEPSIS)
500.0000 mL | Freq: Once | INTRAVENOUS | Status: AC
Start: 2013-09-06 — End: 2013-09-06
  Administered 2013-09-06: 500 mL via INTRAVENOUS

## 2013-09-06 MED ORDER — ACETAMINOPHEN 325 MG PO TABS
650.0000 mg | ORAL_TABLET | Freq: Once | ORAL | Status: AC
  Administered 2013-09-06: 650 mg via ORAL
  Filled 2013-09-06: qty 2

## 2013-09-06 NOTE — ED Provider Notes (Signed)
  Face-to-face evaluation   History: He developed dizziness today after being treated by his chiropractor. While he was there he was also told that his blood pressure was high. He has had a headache, all day. He denies fever, chills, neck pain, weakness, or paresthesia    Physical exam: Undernourished, calm, cooperative. Heart regular rate and rhythm. Murmur. Lungs clear to auscultation. Cranial nerves are intact grossly. Strength and sensation of arms and legs, bilaterally. No dysarthria, no aphasia. No nystagmus. Mild tremor. No ataxia  Medical screening examination/treatment/procedure(s) were conducted as a shared visit with non-physician practitioner(s) and myself.  I personally evaluated the patient during the encounter  Flint MelterElliott L Chemeka Filice, MD 09/07/13 [phone]

## 2013-09-06 NOTE — Discharge Instructions (Signed)
Please read and follow all provided instructions.  Your diagnoses today include:  1. Lightheadedness   2. Tremor     Tests performed today include:  EKG - unchanged  Blood counts and electrolytes - normal  CT scan of your head that did not show any serious injury.  Vital signs. See below for your results today.   Medications prescribed:   Ativan - medication for alcohol withdrawal and tremor  Take any prescribed medications only as directed.  Home care instructions:  Follow any educational materials contained in this packet.  Follow-up instructions: Please follow-up with your primary care provider in the next 3 days for further evaluation of your symptoms. If you do not have a primary care doctor -- see below for referral information.   Return instructions:  SEEK IMMEDIATE MEDICAL ATTENTION IF:  There is confusion or drowsiness (although children frequently become drowsy after injury).   You cannot awaken the injured person.   You have more than one episode of vomiting.   You notice dizziness or unsteadiness which is getting worse, or inability to walk.   You have convulsions or unconsciousness.   You experience severe, persistent headaches not relieved by Tylenol.  You cannot use arms or legs normally.   There are changes in pupil sizes. (This is the black center in the colored part of the eye)   There is clear or bloody discharge from the nose or ears.   You have change in speech, vision, swallowing, or understanding.   Localized weakness, numbness, tingling, or change in bowel or bladder control.  You have any other emergent concerns.  Additional Information: You have had a head injury which does not appear to require admission at this time.  Your vital signs today were: BP 131/87   Pulse 79   Temp(Src) 98.8 F (37.1 C) (Oral)   Resp 18   Ht 5\' 10"  (1.778 m)   Wt 114 lb (51.71 kg)   BMI 16.36 kg/m2   SpO2 100% If your blood pressure (BP) was elevated  above 135/85 this visit, please have this repeated by your doctor within one month. --------------  Emergency Department Resource Guide 1) Find a Doctor and Pay Out of Pocket Although you won't have to find out who is covered by your insurance plan, it is a good idea to ask around and get recommendations. You will then need to call the office and see if the doctor you have chosen will accept you as a new patient and what types of options they offer for patients who are self-pay. Some doctors offer discounts or will set up payment plans for their patients who do not have insurance, but you will need to ask so you aren't surprised when you get to your appointment.  2) Contact Your Local Health Department Not all health departments have doctors that can see patients for sick visits, but many do, so it is worth a call to see if yours does. If you don't know where your local health department is, you can check in your phone book. The CDC also has a tool to help you locate your state's health department, and many state websites also have listings of all of their local health departments.  3) Find a Walk-in Clinic If your illness is not likely to be very severe or complicated, you may want to try a walk in clinic. These are popping up all over the country in pharmacies, drugstores, and shopping centers. They're usually staffed by nurse practitioners  or physician assistants that have been trained to treat common illnesses and complaints. They're usually fairly quick and inexpensive. However, if you have serious medical issues or chronic medical problems, these are probably not your best option.  No Primary Care Doctor: - Call Health Connect at  [phone] - they can help you locate a primary care doctor that  accepts your insurance, provides certain services, etc. - Physician Referral Service- [phone]  Chronic Pain Problems: Organization         Address  Phone   Notes  [facility]   [phone]) [phone] Patients need to be referred by their primary care doctor.   Medication Assistance: Organization         Address  Phone   Notes  [facility] [address] [place], [place] [address] [phone]) [phone] --Must be a resident of [facility] [place] -- Must have NO insurance coverage whatsoever (no Medicaid/ Medicare, etc.) -- The pt. MUST have a primary care doctor that directs their care regularly and follows them in the community   MedAssist  [phone]) [phone]   [employer]  [phone]) [phone]    Agencies that provide inexpensive medical care: Organization         Address  Phone   Notes  [facility]  [phone]) [phone]   [facility]    [phone]   [facility] [address] [place], [place] [address] [phone]) [phone]   [facility] [address], [place] [phone]   Planned Parenthood    [phone]) [phone]   [facility]    [phone]) [phone]   [facility]  [address], [place] Phone:  [phone]) [phone], Fax:  [phone]) [phone] Hours of Operation:  9 am - 6 pm, M-F.  Also accepts Medicaid/Medicare and self-pay.  [facility]  [address], [place] Phone: [phone]) [phone], Fax: [phone]) [phone]. Hours of Operation:  8:30 am - 5:30 pm, M-F.  Also accepts Medicaid and self-pay.  [facility] [place] [address], [place] Phone: [phone](336) [phone]   [facility] [address], [place], [place] [phone], Ext. [phone] Mondays & Thursdays: 7-9 AM.  First 15 patients are seen on a first come, first serve basis.    Medicaid-accepting [facility] Providers:  Organization         Address  Phone   Notes  [facility] [address], [place] [phone] Also accepts self-pay patients.  [facility] [address] [place] [place],  [place]  [phone]) [phone]   [facility] [address], [place] [phone]) [phone]   [facility] [address] [place] [address], [place] [phone]) [phone]   Renaye RakersVeita Bland [address], [place]   [phone] Only accepts [place] Access [place] patients after they have their name applied to their card.   Self-Pay (no insurance) in [facility] [place]:  Organization         Address  Phone   Notes  Sickle Cell Patients, [facility] [address], [place] [phone]) [phone]   [facility] [address], [place] [phone]) [phone]   [facility] [place]  [address],  [phone]) [phone]   [facility]/Dr. Julio Sickssei-Bonsu  [phone]  [place] [address], [place] or [address], [place] [phone] Phone number for both [place] and [place] locations is the same.  °[facility] [address], [place] [phone]   °[facility] [address] [place] [address], [place] or [address] [phone] °[phone]   °[facility] [address], [place] [phone], phone; [phone], fax Sees patients 1st and 3rd Saturday of every month.  Must not qualify for public or private insurance (i.e. Medicaid, Medicare, [place] Health Choice, Veterans' Benefits) • Household income should be no more than 200% of the poverty level •The clinic cannot treat you if you are pregnant or think you are pregnant • Sexually transmitted diseases are not treated at the clinic.  ° ° °Dental Care: °Organization         Address  Phone  Notes  °[facility] [address], [place] [phone] Accepts children up to age 21 who are enrolled in Medicaid or [place] Health Choice; pregnant women with a Medicaid card; and children who have applied for Medicaid or [place] Health  Choice, but were declined, whose parents can pay a reduced fee at time of service.  °[place] [place]  [address], [place] [phone] Accepts children up to age 21 who are enrolled in Medicaid or [place] Health Choice; pregnant women with a Medicaid card; and children who have applied for Medicaid or [place] Health Choice, but were declined, whose parents can pay a reduced fee at time of service.  °[facility] ° [address], [place] [phone] Patients are seen by appointment only. Walk-ins are not accepted. [facility] will see patients 18 years of age and older. °Monday - Tuesday (8am-5pm) °Most Wednesdays (8:30-5pm) °$30 per visit, cash only  °[facility] ° [address], [place] [phone] Patients are seen by appointment only. Walk-ins are not accepted. [facility] will see patients 18 years of age and older. °One Wednesday Evening (Monthly: Volunteer Based).  $30 per visit, cash only  °[facility]  [phone] for adults; Children under age 4, call Graduate Pediatric Dentistry at [phone]. Children aged 4-14, please call [phone] to request a pediatric application. ° Dental services are provided in all areas of dental care including fillings, crowns and bridges, complete and partial dentures, implants, gum treatment, root canals, and extractions. Preventive care is also provided. Treatment is provided to both adults and children. °Patients are selected via a lottery and there is often a waiting list. °  °[facility] [address], °[place] ° [phone] [email or web address] °  °[facility] [address], [place], [place] [phone], Ext. [phone] Second and Fourth Thursday of each month, opens at 6:30 AM; Clinic ends at 9 AM.  Patients are seen on a first-come first-served basis, and a limited number are seen during each  clinic.  ° °[facility] ° [address], [place],  [phone]   Eligibility Requirements °You must have lived in [place], [place], or [place] counties for at least the last three months. °  You cannot be eligible for state or federal sponsored healthcare insurance, including Veterans Administration, Medicaid, or Medicare. °  You generally cannot be eligible for healthcare insurance through your employer.  °    How to apply: Eligibility screenings are held every Tuesday and Wednesday afternoon from 1:00 pm until 4:00 pm. You do not need an appointment for the interview!  [facility] [address] [place], [place]   [place] Department  [place]  [phone]    Behavioral Health Resources in the Community: Intensive Outpatient Programs Organization         Address  Phone  Notes  [place]. [address] [place], [place] [phone]   [facility] Outpatient [address] [place], [place]   ADS: [facility] & Drug S[facility]cs [address], [place], [place]   [place] [address]  [place], [place] or [phone]   Substance Abuse Resources Organization         Address  Phone  Notes  Alcohol and Drug Services  [phone]   [place]  [phone]   [facility] [place]   Chinita Pester  [phone]   Residential & Outpatient Substance Abuse Program  [phone]   Psychological Services Organization         Address  Phone  Notes  [facility] [place]  [place]  [phone]   [place] [address] [place] or [phone]    Mobile Crisis Teams Organization         Address  Phone  Notes  [facility], Mobile  Crisis Care Unit  [phone]   [facility]  [address] [place], [place]   [place] Levels [address] [place] [address] [phone]    Self-Help/Support Groups Organization         Address  Phone             Notes  [place] - variety of support groups  [place] Call for more information  Narcotics Anonymous (NA), [facility] [address], [employer] [place]  2 meetings at this location   Special educational needs teacher         Address  Phone  Notes  [facility] [place],    [place]  [phone]   [facility]  [address] [place] 2[address]79, [place], [place]   [place], [place] [phone] Admissions: 8am-3pm M-F  Incentives Substance [place] [address]    [place], [place] [phone]   [facility] [address] [place], [place], [place]   [facility] [address],  [place], [place]   [facility] - Intensive Outpatient [address], [place], [place]   [facility] ([place] [address].,  [place], [place] [phone] or [phone]   [facility] ([facility]S) [address] [place], [place] Accepts Medicaid  [facility] [place] [address],  [place] [phone] Substance Abuse/Addiction Treatment   [facility] Organization         Address  Phone  Notes  [facility]  [phone]   Domenic Schwab, PhD [address]lis [place], [place]   [phone] or [phone]   [place], [place] [phone]   [facility] [address],  [place], [place] [phone] Insurance/Medicaid/sponsorship through [facility] [address]                                    [place], [place] [phone] Therapy/tele-psych/case  [facility] [address]   [place], [place] [phone]    Dr. Lolly Mustache  [phone]   [facility] [place]  [employer] [facility] Dept. 1) [address], [place] 2) [address] [place] 3)  [address], [place] [phone]  [phone]   [facility] Child Abuse Hotline [phone] or [phone] (After Hours)

## 2013-09-06 NOTE — ED Notes (Signed)
Pt did feel SOB, now resolved. Dnies CP, N/V/D

## 2013-09-06 NOTE — ED Notes (Signed)
Per ems, pt went to chiropractor today. While he was there, was told BP was high, did not say value. As he left, was walking on street. Had sudden onset of dizziness, pt had to sit down in order to not pass out. Pt also complains of headache 8/10. Denies vision problems. 12 lead unremarkable  158/104, RR 17, 100% RA, 98 HR, CBG 146. PT AAOX4, in NAD, skin warm and dry.

## 2013-09-06 NOTE — ED Provider Notes (Signed)
CSN: [id number]     Arrival date & time 09/06/13  1517 History   First MD Initiated Contact with Patient 09/06/13 1520     Chief Complaint  Patient presents with  . Dizziness  . Headache     (Consider location/radiation/quality/duration/timing/severity/associated sxs/prior Treatment) HPI Comments: Patients with history of high blood pressure -- presents with complaint of acute onset of dizziness described as both lightheadedness and spinning. Patient states that he feels more lightheaded and off-balance. This began after leaving his chiropractor who he saw for lower back pain. Patient had to sit down to prevent from passing out. Patient also has a headache. Patient had a headache prior to the symptoms starting today. Patient states that he has been getting headaches like this "for years". He has had some blurry vision. No chest pain or shortness of breath. No N/V/D. No urinary symptoms. The onset of this condition was acute. The course is constant. Aggravating factors: none. Alleviating factors: none.    The history is provided by the patient and medical records.    Past Medical History  Diagnosis Date  . Asthma   . Bronchitis   . Hypertension   . Bronchitis    History reviewed. No pertinent past surgical history. Family History  Problem Relation Age of Onset  . Cancer Mother   . Cancer Sister    History  Substance Use Topics  . Smoking status: Current Every Day Smoker -- 1.00 packs/day    Types: Cigarettes  . Smokeless tobacco: Not on file  . Alcohol Use: 16.8 oz/week    28 Cans of beer per week    Review of Systems  Constitutional: Negative for fever.  HENT: Negative for congestion, dental problem, rhinorrhea, sinus pressure and sore throat.   Eyes: Negative for photophobia, discharge, redness and visual disturbance.  Respiratory: Negative for cough and shortness of breath.   Cardiovascular: Negative for chest pain.  Gastrointestinal: Negative for nausea, vomiting,  abdominal pain and diarrhea.  Genitourinary: Negative for dysuria.  Musculoskeletal: Negative for gait problem, myalgias, neck pain and neck stiffness.  Skin: Negative for rash.  Neurological: Positive for dizziness and headaches. Negative for syncope, facial asymmetry, speech difficulty, weakness, light-headedness and numbness.  Psychiatric/Behavioral: Negative for confusion.      Allergies  Review of patient's allergies indicates no known allergies.  Home Medications   Current Outpatient Rx  Name  Route  Sig  Dispense  Refill  . guaifenesin (ROBITUSSIN) 100 MG/5ML syrup   Oral   Take 200 mg by mouth 3 (three) times daily as needed for cough or congestion.          BP 151/96  Pulse 87  Temp(Src) 98.8 F (37.1 C) (Oral)  Resp 18  Ht 5\' 10"  (1.778 m)  Wt 114 lb (51.71 kg)  BMI 16.36 kg/m2  SpO2 97% Physical Exam  Nursing note and vitals reviewed. Constitutional: He is oriented to person, place, and time. He appears well-developed and well-nourished.  HENT:  Head: Normocephalic and atraumatic.  Right Ear: Tympanic membrane, external ear and ear canal normal.  Left Ear: Tympanic membrane, external ear and ear canal normal.  Nose: Nose normal.  Mouth/Throat: Uvula is midline, oropharynx is clear and moist and mucous membranes are normal.  Eyes: Conjunctivae, EOM and lids are normal. Pupils are equal, round, and reactive to light. Right eye exhibits no discharge. Left eye exhibits no discharge.  Neck: Normal range of motion. Neck supple.  Cardiovascular: Normal rate and regular rhythm.   Murmur (  III/VI) heard. Pulmonary/Chest: Effort normal and breath sounds normal.  Abdominal: Soft. There is no tenderness.  Musculoskeletal: Normal range of motion.       Cervical back: He exhibits normal range of motion, no tenderness and no bony tenderness.  Neurological: He is alert and oriented to person, place, and time. He has normal strength and normal reflexes. No cranial nerve  deficit or sensory deficit. He exhibits normal muscle tone. He displays a negative Romberg sign. Coordination and gait normal. GCS eye subscore is 4. GCS verbal subscore is 5. GCS motor subscore is 6.  Few beats of lateral nystagmus bilaterally, neg cover/uncover, eyes fixed on nose with head impact testing.   Skin: Skin is warm and dry.  Psychiatric: He has a normal mood and affect.    ED Course  Procedures (including critical care time) Labs Review Labs Reviewed  CBC WITH DIFFERENTIAL - Abnormal; Notable for the following:    RBC 4.02 (*)    HCT 38.7 (*)    MCH 35.1 (*)    MCHC 36.4 (*)    Monocytes Relative 13 (*)    All other components within normal limits  COMPREHENSIVE METABOLIC PANEL - Abnormal; Notable for the following:    AST 56 (*)    All other components within normal limits   Imaging Review No results found.   EKG Interpretation   Date/Time:  Saturday September 06 2013 15:27:29 EDT Ventricular Rate:  72 PR Interval:  128 QRS Duration: 87 QT Interval:  397 QTC Calculation: 434 R Axis:   85 Text Interpretation:  Sinus rhythm since last tracing no significant  change Confirmed by Effie ShyWENTZ  MD, ELLIOTT ([id number]) on 09/06/2013 3:34:33 PM      3:43 PM Patient seen and examined. Work-up initiated. Medications ordered.   Vital signs reviewed and are as follows: Filed Vitals:   09/06/13 1528  BP: 151/96  Pulse: 87  Temp: 98.8 F (37.1 C)  Resp: 18   Prior to discharge, patient states that he was feeling better. His blood pressure is improved without treatment. He has ambulated in the department without difficulty.  He has continued tremor prior to discharge. Patient states that he last drank alcohol 2 days ago and drank approximately a '40'. Will discharge with Ativan for symptomatic relief. Patient encouraged to stop drinking alcohol.  MDM   Final diagnoses:  Lightheadedness  Tremor   Patient with near syncopal episode in setting of high blood pressure. In  emergency department, patient has normal neurological exam except for tremor, possibly due to alcohol withdrawal (24-48 hrs since last drink). CT of head is negative. Patient has mild headache. No concern for meningitis. Workup is reassuring. Patient is ambulatory without assistance. Feel patient is safe for discharge to home. Will not start treatment for high blood pressure. Very low suspicion for cardiac arrhythmia given the patient's presentation. EKG unconcerning.     Renne CriglerJoshua Raliegh Scobie, PA-C 09/06/13 2119

## 2013-12-19 ENCOUNTER — Encounter (HOSPITAL_COMMUNITY): Payer: Self-pay | Admitting: Emergency Medicine

## 2013-12-19 ENCOUNTER — Emergency Department (HOSPITAL_COMMUNITY)
Admission: EM | Admit: 2013-12-19 | Discharge: 2013-12-19 | Disposition: A | Discharge status: Home / Self Care | Payer: Self-pay | Attending: Emergency Medicine | Admitting: Emergency Medicine

## 2013-12-19 ENCOUNTER — Emergency Department (HOSPITAL_COMMUNITY): Payer: Self-pay

## 2013-12-19 DIAGNOSIS — M549 Dorsalgia, unspecified: Secondary | ICD-10-CM | POA: Insufficient documentation

## 2013-12-19 DIAGNOSIS — R748 Abnormal levels of other serum enzymes: Secondary | ICD-10-CM

## 2013-12-19 DIAGNOSIS — F1010 Alcohol abuse, uncomplicated: Secondary | ICD-10-CM | POA: Insufficient documentation

## 2013-12-19 DIAGNOSIS — R062 Wheezing: Secondary | ICD-10-CM | POA: Insufficient documentation

## 2013-12-19 DIAGNOSIS — R197 Diarrhea, unspecified: Secondary | ICD-10-CM | POA: Insufficient documentation

## 2013-12-19 DIAGNOSIS — F10920 Alcohol use, unspecified with intoxication, uncomplicated: Secondary | ICD-10-CM

## 2013-12-19 DIAGNOSIS — F17200 Nicotine dependence, unspecified, uncomplicated: Secondary | ICD-10-CM | POA: Insufficient documentation

## 2013-12-19 DIAGNOSIS — R55 Syncope and collapse: Secondary | ICD-10-CM

## 2013-12-19 DIAGNOSIS — I10 Essential (primary) hypertension: Secondary | ICD-10-CM | POA: Insufficient documentation

## 2013-12-19 DIAGNOSIS — R42 Dizziness and giddiness: Secondary | ICD-10-CM | POA: Insufficient documentation

## 2013-12-19 DIAGNOSIS — G8929 Other chronic pain: Secondary | ICD-10-CM

## 2013-12-19 DIAGNOSIS — J45909 Unspecified asthma, uncomplicated: Secondary | ICD-10-CM | POA: Insufficient documentation

## 2013-12-19 LAB — COMPREHENSIVE METABOLIC PANEL
ALT: 69 U/L — ABNORMAL HIGH (ref 0–53)
AST: 193 U/L — ABNORMAL HIGH (ref 0–37)
Albumin: 4 g/dL (ref 3.5–5.2)
Alkaline Phosphatase: 59 U/L (ref 39–117)
Anion gap: 14 (ref 5–15)
BILIRUBIN TOTAL: 0.3 mg/dL (ref 0.3–1.2)
BUN: 6 mg/dL (ref 6–23)
CALCIUM: 9.3 mg/dL (ref 8.4–10.5)
CO2: 23 meq/L (ref 19–32)
CREATININE: 0.65 mg/dL (ref 0.50–1.35)
Chloride: 104 mEq/L (ref 96–112)
GLUCOSE: 77 mg/dL (ref 70–99)
Potassium: 3.8 mEq/L (ref 3.7–5.3)
Sodium: 141 mEq/L (ref 137–147)
Total Protein: 7.5 g/dL (ref 6.0–8.3)

## 2013-12-19 LAB — URINALYSIS, ROUTINE W REFLEX MICROSCOPIC
Bilirubin Urine: NEGATIVE
GLUCOSE, UA: NEGATIVE mg/dL
Hgb urine dipstick: NEGATIVE
KETONES UR: NEGATIVE mg/dL
LEUKOCYTES UA: NEGATIVE
Nitrite: NEGATIVE
PROTEIN: NEGATIVE mg/dL
Specific Gravity, Urine: 1.003 — ABNORMAL LOW (ref 1.005–1.030)
UROBILINOGEN UA: 0.2 mg/dL (ref 0.0–1.0)
pH: 5 (ref 5.0–8.0)

## 2013-12-19 LAB — CBC WITH DIFFERENTIAL/PLATELET
Basophils Absolute: 0.1 10*3/uL (ref 0.0–0.1)
Basophils Relative: 1 % (ref 0–1)
EOS PCT: 2 % (ref 0–5)
Eosinophils Absolute: 0.1 10*3/uL (ref 0.0–0.7)
HCT: 38.5 % — ABNORMAL LOW (ref 39.0–52.0)
Hemoglobin: 13.3 g/dL (ref 13.0–17.0)
LYMPHS ABS: 2.7 10*3/uL (ref 0.7–4.0)
LYMPHS PCT: 54 % — AB (ref 12–46)
MCH: 34.8 pg — ABNORMAL HIGH (ref 26.0–34.0)
MCHC: 34.5 g/dL (ref 30.0–36.0)
MCV: 100.8 fL — AB (ref 78.0–100.0)
MONOS PCT: 8 % (ref 3–12)
Monocytes Absolute: 0.4 10*3/uL (ref 0.1–1.0)
Neutro Abs: 1.8 10*3/uL (ref 1.7–7.7)
Neutrophils Relative %: 35 % — ABNORMAL LOW (ref 43–77)
Platelets: 165 10*3/uL (ref 150–400)
RBC: 3.82 MIL/uL — AB (ref 4.22–5.81)
RDW: 14.1 % (ref 11.5–15.5)
WBC: 5 10*3/uL (ref 4.0–10.5)

## 2013-12-19 LAB — ETHANOL: ALCOHOL ETHYL (B): 318 mg/dL — AB (ref 0–11)

## 2013-12-19 LAB — TROPONIN I: Troponin I: <0.3 ng/mL (ref <0.30)

## 2013-12-19 MED ORDER — SODIUM CHLORIDE 0.9 % IV BOLUS (SEPSIS)
1000.0000 mL | Freq: Once | INTRAVENOUS | Status: AC
  Administered 2013-12-19: 1000 mL via INTRAVENOUS

## 2013-12-19 MED ORDER — IPRATROPIUM-ALBUTEROL 0.5-2.5 (3) MG/3ML IN SOLN
3.0000 mL | Freq: Once | RESPIRATORY_TRACT | Status: AC
  Administered 2013-12-19: 3 mL via RESPIRATORY_TRACT
  Filled 2013-12-19: qty 3

## 2013-12-19 NOTE — Discharge Instructions (Signed)
Drink fluids and rest - lots of water!  Avoid alcohol - read below  Follow-up with your doctor regarding your elevated liver enzymes  Return to the emergency department if you develop any changing/worsening condition, chest pain, passing out, difficulty breathing, or any other concerns (please read additional information regarding your condition below)   Alcohol Intoxication Alcohol intoxication occurs when the amount of alcohol that a person has consumed impairs his or her ability to mentally and physically function. Alcohol directly impairs the normal chemical activity of the brain. Drinking large amounts of alcohol can lead to changes in mental function and behavior, and it can cause many physical effects that can be harmful.  Alcohol intoxication can range in severity from mild to very severe. Various factors can affect the level of intoxication that occurs, such as the person's age, gender, weight, frequency of alcohol consumption, and the presence of other medical conditions (such as diabetes, seizures, or heart conditions). Dangerous levels of alcohol intoxication may occur when people drink large amounts of alcohol in a short period (binge drinking). Alcohol can also be especially dangerous when combined with certain prescription medicines or "recreational" drugs. SIGNS AND SYMPTOMS Some common signs and symptoms of mild alcohol intoxication include:  Loss of coordination.  Changes in mood and behavior.  Impaired judgment.  Slurred speech. As alcohol intoxication progresses to more severe levels, other signs and symptoms will appear. These may include:  Vomiting.  Confusion and impaired memory.  Slowed breathing.  Seizures.  Loss of consciousness. DIAGNOSIS  Your health care provider will take a medical history and perform a physical exam. You will be asked about the amount and type of alcohol you have consumed. Blood tests will be done to measure the concentration of alcohol  in your blood. In many places, your blood alcohol level must be lower than 80 mg/dL (1.61%) to legally drive. However, many dangerous effects of alcohol can occur at much lower levels.  TREATMENT  People with alcohol intoxication often do not require treatment. Most of the effects of alcohol intoxication are temporary, and they go away as the alcohol naturally leaves the body. Your health care provider will monitor your condition until you are stable enough to go home. Fluids are sometimes given through an IV access tube to help prevent dehydration.  HOME CARE INSTRUCTIONS  Do not drive after drinking alcohol.  Stay hydrated. Drink enough water and fluids to keep your urine clear or pale yellow. Avoid caffeine.   Only take over-the-counter or prescription medicines as directed by your health care provider.  SEEK MEDICAL CARE IF:   You have persistent vomiting.   You do not feel better after a few days.  You have frequent alcohol intoxication. Your health care provider can help determine if you should see a substance use treatment counselor. SEEK IMMEDIATE MEDICAL CARE IF:   You become shaky or tremble when you try to stop drinking.   You shake uncontrollably (seizure).   You throw up (vomit) blood. This may be bright red or may look like black coffee grounds.   You have blood in your stool. This may be bright red or may appear as a black, tarry, bad smelling stool.   You become lightheaded or faint.  MAKE SURE YOU:   Understand these instructions.  Will watch your condition.  Will get help right away if you are not doing well or get worse. Document Released: 03/15/2005 Document Revised: 02/05/2013 Document Reviewed: 11/08/2012 [facility] Patient Information 2015 [place], [place].  This information is not intended to replace advice given to you by your health care provider. Make sure you discuss any questions you have with your health care provider.  Syncope Syncope is a  medical term for fainting or passing out. This means you lose consciousness and drop to the ground. People are generally unconscious for less than 5 minutes. You may have some muscle twitches for up to 15 seconds before waking up and returning to normal. Syncope occurs more often in older adults, but it can happen to anyone. While most causes of syncope are not dangerous, syncope can be a sign of a serious medical problem. It is important to seek medical care.  CAUSES  Syncope is caused by a sudden drop in blood flow to the brain. The specific cause is often not determined. Factors that can bring on syncope include:  Taking medicines that lower blood pressure.  Sudden changes in posture, such as standing up quickly.  Taking more medicine than prescribed.  Standing in one place for too long.  Seizure disorders.  Dehydration and excessive exposure to heat.  Low blood sugar (hypoglycemia).  Straining to have a bowel movement.  Heart disease, irregular heartbeat, or other circulatory problems.  Fear, emotional distress, seeing blood, or severe pain. SYMPTOMS  Right before fainting, you may:  Feel dizzy or light-headed.  Feel nauseous.  See all white or all black in your field of vision.  Have cold, clammy skin. DIAGNOSIS  Your health care provider will ask about your symptoms, perform a physical exam, and perform an electrocardiogram (ECG) to record the electrical activity of your heart. Your health care provider may also perform other heart or blood tests to determine the cause of your syncope which may include:  Transthoracic echocardiogram (TTE). During echocardiography, sound waves are used to evaluate how blood flows through your heart.  Transesophageal echocardiogram (TEE).  Cardiac monitoring. This allows your health care provider to monitor your heart rate and rhythm in real time.  Holter monitor. This is a portable device that records your heartbeat and can help  diagnose heart arrhythmias. It allows your health care provider to track your heart activity for several days, if needed.  Stress tests by exercise or by giving medicine that makes the heart beat faster. TREATMENT  In most cases, no treatment is needed. Depending on the cause of your syncope, your health care provider may recommend changing or stopping some of your medicines. HOME CARE INSTRUCTIONS  Have someone stay with you until you feel stable.  Do not drive, use machinery, or play sports until your health care provider says it is okay.  Keep all follow-up appointments as directed by your health care provider.  Lie down right away if you start feeling like you might faint. Breathe deeply and steadily. Wait until all the symptoms have passed.  Drink enough fluids to keep your urine clear or pale yellow.  If you are taking blood pressure or heart medicine, get up slowly and take several minutes to sit and then stand. This can reduce dizziness. SEEK IMMEDIATE MEDICAL CARE IF:   You have a severe headache.  You have unusual pain in the chest, abdomen, or back.  You are bleeding from your mouth or rectum, or you have black or tarry stool.  You have an irregular or very fast heartbeat.  You have pain with breathing.  You have repeated fainting or seizure-like jerking during an episode.  You faint when sitting or lying down.  You  have confusion.  You have trouble walking.  You have severe weakness.  You have vision problems. If you fainted, call your local emergency services (911 in U.[place].). Do not drive yourself to the hospital.  MAKE SURE YOU:  Understand these instructions.  Will watch your condition.  Will get help right away if you are not doing well or get worse. Document Released: 06/05/2005 Document Revised: 06/10/2013 Document Reviewed: 08/04/2011 [facility] Patient Information 2015 [place], [place]. This information is not intended to replace advice given to you by  your health care provider. Make sure you discuss any questions you have with your health care provider.  Back Pain, Adult Low back pain is very common. About 1 in 5 people have back pain.The cause of low back pain is rarely dangerous. The pain often gets better over time.About half of people with a sudden onset of back pain feel better in just 2 weeks. About 8 in 10 people feel better by 6 weeks.  CAUSES Some common causes of back pain include:  Strain of the muscles or ligaments supporting the spine.  Wear and tear (degeneration) of the spinal discs.  Arthritis.  Direct injury to the back. DIAGNOSIS Most of the time, the direct cause of low back pain is not known.However, back pain can be treated effectively even when the exact cause of the pain is unknown.Answering your caregiver's questions about your overall health and symptoms is one of the most accurate ways to make sure the cause of your pain is not dangerous. If your caregiver needs more information, he or she may order lab work or imaging tests (X-rays or MRIs).However, even if imaging tests show changes in your back, this usually does not require surgery. HOME CARE INSTRUCTIONS For many people, back pain returns.Since low back pain is rarely dangerous, it is often a condition that people can learn to [facility] their own.   Remain active. It is stressful on the back to sit or stand in one place. Do not sit, drive, or stand in one place for more than 30 minutes at a time. Take short walks on level surfaces as soon as pain allows.Try to increase the length of time you walk each day.  Do not stay in bed.Resting more than 1 or 2 days can delay your recovery.  Do not avoid exercise or work.Your body is made to move.It is not dangerous to be active, even though your back may hurt.Your back will likely heal faster if you return to being active before your pain is gone.  Pay attention to your body when you bend and lift. Many  people have less discomfortwhen lifting if they bend their knees, keep the load close to their bodies,and avoid twisting. Often, the most comfortable positions are those that put less stress on your recovering back.  Find a comfortable position to sleep. Use a firm mattress and lie on your side with your knees slightly bent. If you lie on your back, put a pillow under your knees.  Only take over-the-counter or prescription medicines as directed by your caregiver. Over-the-counter medicines to reduce pain and inflammation are often the most helpful.Your caregiver may prescribe muscle relaxant drugs.These medicines help dull your pain so you can more quickly return to your normal activities and healthy exercise.  Put ice on the injured area.  Put ice in a plastic bag.  Place a towel between your skin and the bag.  Leave the ice on for 15-20 minutes, 03-04 times a day for the  first 2 to 3 days. After that, ice and heat may be alternated to reduce pain and spasms.  Ask your caregiver about trying back exercises and gentle massage. This may be of some benefit.  Avoid feeling anxious or stressed.Stress increases muscle tension and can worsen back pain.It is important to recognize when you are anxious or stressed and learn ways to manage it.Exercise is a great option. SEEK MEDICAL CARE IF:  You have pain that is not relieved with rest or medicine.  You have pain that does not improve in 1 week.  You have new symptoms.  You are generally not feeling well. SEEK IMMEDIATE MEDICAL CARE IF:   You have pain that radiates from your back into your legs.  You develop new bowel or bladder control problems.  You have unusual weakness or numbness in your arms or legs.  You develop nausea or vomiting.  You develop abdominal pain.  You feel faint. Document Released: 06/05/2005 Document Revised: 12/05/2011 Document Reviewed: 10/24/2010 [facility] Patient Information 2015 [place], [place]. This  information is not intended to replace advice given to you by your health care provider. Make sure you discuss any questions you have with your health care provider.   Emergency Department Resource Guide 1) Find a Doctor and Pay Out of Pocket Although you won't have to find out who is covered by your insurance plan, it is a good idea to ask around and get recommendations. You will then need to call the office and see if the doctor you have chosen will accept you as a new patient and what types of options they offer for patients who are self-pay. Some doctors offer discounts or will set up payment plans for their patients who do not have insurance, but you will need to ask so you aren't surprised when you get to your appointment.  2) Contact Your Local Health Department Not all health departments have doctors that can see patients for sick visits, but many do, so it is worth a call to see if yours does. If you don't know where your local health department is, you can check in your phone book. The CDC also has a tool to help you locate your state's health department, and many state websites also have listings of all of their local health departments.  3) Find a Walk-in Clinic If your illness is not likely to be very severe or complicated, you may want to try a walk in clinic. These are popping up all over the country in pharmacies, drugstores, and shopping centers. They're usually staffed by nurse practitioners or physician assistants that have been trained to treat common illnesses and complaints. They're usually fairly quick and inexpensive. However, if you have serious medical issues or chronic medical problems, these are probably not your best option.  No Primary Care Doctor: - Call Health Connect at  [phone] - they can help you locate a primary care doctor that  accepts your insurance, provides certain services, etc. - Physician Referral Service- [phone]  Chronic Pain  Problems: Organization         Address  Phone   Notes  [facility]  [phone] Patients need to be referred by their primary care doctor.   Medication Assistance: Organization         Address  Phone   Notes  [facility] [address] [place], [place] [address] [phone] --Must be a resident of [facility] [place] -- Must have  NO insurance coverage whatsoever (no Medicaid/ Medicare, etc.) -- The pt. MUST have a primary care doctor that directs their care regularly and follows them in the community   MedAssist  [phone]   [employer]  [phone]    Agencies that provide inexpensive medical care: Organization         Address  Phone   Notes  [facility]  [phone]   [facility]    [phone]   [facility] [address] [place], [place] [address] [phone]   [facility] [address] [place], [place] [phone]   Planned Parenthood    [phone]   [facility]    [phone]   [facility]  [address], [place] Phone:  [phone], Fax:  [phone] Hours of Operation:  9 am - 6 pm, M-F.  Also accepts Medicaid/Medicare and self-pay.  [facility]  [address], [place] Phone: [phone], Fax: [phone]. Hours of Operation:  8:30 am - 5:30 pm, M-F.  Also accepts Medicaid and self-pay.  [facility] [place] [address] [place] Phone: [phone]   [facility] [address] Natasha Bence [place], [place] [phone], Ext. [phone] Mondays & Thursdays: 7-9 AM.  First 15 patients are seen on a first come, first serve basis.    Medicaid-accepting [facility] Providers:  Organization         Address  Phone   Notes  [facility] [address], [place] [phone] Also  accepts self-pay patients.  [facility] [address] Laurell Josephs [place], [place]  [phone]   [facility] [address], [place] [phone]   [facility] [address], [place] [phone]   Renaye Rakers [address], [place]   [phone] Only accepts [place] patients after they have their name applied to their card.   Self-Pay (no insurance) in [facility]:  Organization         Address  Phone   Notes  Sickle Cell Patients, [facility] [address] [place], [place] [phone]   [facility] [address] [place], [place] [phone]   [facility] [place]  [address], [place] [phone]   [facility]/Dr. Osei-Bonsu  [address], [place] or [address], [place] [phone] Phone number for both [place] and [place] locations is the same.  [facility] [address], [place] [phone]   [facility] [address], [place] or [address] [phone]   [facility] [address] [place] [phone], phone; [phone], fax Sees patients 1st and 3rd Saturday of every month.  Must not qualify for public or private insurance (i.e. Medicaid, Medicare, [place] Health Choice, Veterans' Benefits)  Household income should be no more than 200% of the poverty level The clinic cannot treat you if you are pregnant or think you are pregnant  Sexually transmitted diseases are not treated at the clinic.    Dental Care: Organization         Address  Phone  Notes  [facility] [address], [place] [phone] Accepts children up to age 53 who are enrolled in [place] or [place] Health Choice; pregnant  women with a Medicaid card; and children who have applied for Medicaid or [place] Health Choice, but were declined, whose parents can pay a reduced fee at time of service.  [facility] [place]  [address], [place] [phone] Accepts children up to age 53 who are enrolled in [place] or [place] Health Choice; pregnant women with a Medicaid card; and children who have applied for Medicaid or [place] Health Choice, but were declined, whose parents can pay a reduced fee at time of service.  [facility]  [address], [place] [phone] Patients are seen by appointment only. Walk-ins are not accepted. [facility] will see patients 53 years of age and older. Monday - Tuesday (8am-5pm) Most Wednesdays (8:30-5pm) $30 per visit, cash only  [facility]  [address], [place] [phone] Patients are seen by appointment only. Walk-ins are not accepted. [facility] will see patients 53 years of age and older. One Wednesday Evening (Monthly: Volunteer Based).  $30 per visit, cash only  [facility]  [phone] for adults; Children under age 284, call Graduate Pediatric Dentistry at [phone]. Children aged 304-14, please call [phone] to request a pediatric application.  Dental services are provided in all areas of dental care including fillings, crowns and bridges, complete and partial dentures, implants, gum treatment, root canals, and extractions. Preventive care is also provided. Treatment is provided to both adults and children. Patients are selected via a lottery and there is often a waiting list.   [facility] [address], [place]  [phone] [email or web address]   [facility] [address], [place], [place] [phone], Ext. [phone] Second and Fourth Thursday of each month, opens at 6:30 AM; Clinic ends at 9 AM.  Patients are  seen on a first-come first-served basis, and a limited number are seen during each clinic.   [facility]  [address], [place], [place] [phone]   Eligibility Requirements You must have lived in [place], or [place] counties for at least the last three months.   You cannot be eligible for state or federal sponsored [place] Cityhealthcare insurance, including CIGNA[place]eterans Administration, [place]icaid, or [employer]Medicare.   You generally cannot be eligible for healthcare insurance through your employer.    How to apply: Eligibility screenings are held every Tuesday and Wednesday afternoon from 1:00 pm until 4:00 pm. You do not need an appointment for the interview!  [facility] [address], [place], [place] [phone]   [facility]  [phone]   [facility]  [phone]   [facility]  [phone]    Behavioral Health Resources in the Community: Intensive Outpatient Programs Organization         Address  Phone  Notes  [facility] [address], [place], [place] [phone]   [facility] [address], [place], [place] [phone]   [facility] [address], [place], [place]  [phone]   [facility] [address],  [place], [place] [phone] or [phone]   Substance Abuse Resources Organization  Address  Phone  Notes  [facility]  [phone]   [facility]  [phone]   [facility]  [phone]   Floydene Flock  [phone]   Residential & Outpatient Substance Abuse Program  [phone]   Psychological Services Organization         Address  Phone  Notes  [facility]  [phone]   [facility]  [phone]   [facility] [address] [place] [phone] or [phone]    Mobile Crisis  Teams Organization         Address  Phone  Notes  [facility], Mobile Crisis Care Unit  [phone]   [facility]  [address] [place], [place] [phone]   Doristine Locks [address] [place] [phone]    Self-Help/Support Groups Organization         Address  Phone             Notes  [facility] - variety of support groups  [phone]- [phone] Call for more information  Narcotics Anonymous (NA), [facility] [address], [employer] [place]  2 meetings at this location   Statistician         Address  Phone  Notes  [facility] [address],    [place]  [phone]   [facility]  [address] [place] [address], [place], [place] [phone]   [facility] [address] [place], [place] [phone] Admissions: 8am-3pm M-F  [facility] [address]    [place], [place] [phone]   [facility] [address] [place], [place], [place] [phone]   [facility] [address],  [place], [place] [phone]   [facility] Int[facility]patient [address], [place], [place] [phone]   [facility] [address] [place]  [place], [place] [phone] or [phone]   [facility] [address], [place], [place] [phone] Accepts Medicaid  [facility] [address]  [place] [phone] Substance Abuse/Addiction Treatment   [facility]         Address  Phone  Notes  [facility]  [phone]   Angie Fava, PhD [address] [place] [place], [place]   [phone] or [phone]   [facility]   [address] [place], [place] [phone]   [facility] [address], [place], [place] [phone]  Insurance/Medicaid/sponsorship through [facility] [address]                                    [place], [place] [phone] Therapy/tele-psych/case  [facility] [address], [place] [phone]    Dr. Lolly Mustache  [phone]   [facility]  [employer] [facility] 1) [address] [place] 2) [address] [place] 3)  [address], [place] [phone]  [phone]   [facility] Child Abuse Hotline [phone] or [phone] (After Hours)

## 2013-12-19 NOTE — ED Provider Notes (Signed)
Medical screening examination/treatment/procedure(s) were conducted as a shared visit with non-physician practitioner(s) and myself.  I personally evaluated the patient during the encounter.   EKG Interpretation   Date/Time:  Friday December 19 2013 15:59:05 EDT Ventricular Rate:  71 PR Interval:  155 QRS Duration: 94 QT Interval:  441 QTC Calculation: 479 R Axis:   90 Text Interpretation:  Sinus rhythm axis if shifted rightwards Nonspecific  T abnormalities, lateral leads artifact Borderline prolonged QT interval  Confirmed by Karma GanjaLINKER  MD, Kielee Care [phone] on 12/19/2013 6:12:28 PM     Pt seen and evaluated, awake and alert, lungs CTA.  Pt able to ambulate and is taking po fluids.  STable for discharge.   Ethelda ChickMartha K Linker, MD 12/19/13 2312

## 2013-12-19 NOTE — ED Notes (Signed)
Bed: [id number] Expected date:  Expected time:  Means of arrival:  Comments: Ems/ethoh/HT

## 2013-12-19 NOTE — ED Notes (Signed)
Per EMS, pt states he had "too much to drink". Pt states he drank "a couple of 40s". Pt also complains of dizziness when standing. Pt states he has had diarrhea and vomiting for the past 2 days. Pt states he has had a lot of stress lately. Pt A&Ox4.

## 2013-12-19 NOTE — ED Provider Notes (Signed)
CSN: [id number]     Arrival date & time 12/19/13  1439 History   First MD Initiated Contact with Patient 12/19/13 1500     Chief Complaint  Patient presents with  . Alcohol Intoxication  . Back Pain  . Dizziness   HPI  Jenelle MagesLevi D Rivard is a 53 y.o. male with a PMH of asthma and HTN who presents to the ED for evaluation of alcohol intoxication, back pain, and dizziness. History was provided by the patient. Patient is a poor historian. Patient states that he got off of work this afternoon and had a few "40's (beer)" while sitting outside. Denies any drug use. He states "I didn't feel right" with dizziness or lightheadedness, but denies this currently. He also states "my friend told me I passed out." He states "I think I was out for a minute" and "I think I fell forward." Patient's friend called 911 and patient was transported to the ED. He denies any known head injury or other trauma. He currently is asymptomatic other than feeling tired and a generalized headache. He denies any chest pain, SOB, abdominal pain, nausea, emesis, vision changes. Patient states he has had diarrhea for the past few days (no hematochezia), but otherwise has been well. He also has chronic unchanged left lower back pain. No loss of bowel/bladder function, weakness, loss of sensation, numbness/tingling.     Past Medical History  Diagnosis Date  . Asthma   . Bronchitis   . Hypertension   . Bronchitis    History reviewed. No pertinent past surgical history. Family History  Problem Relation Age of Onset  . Cancer Mother   . Cancer Sister    History  Substance Use Topics  . Smoking status: Current Every Day Smoker -- 1.00 packs/day    Types: Cigarettes  . Smokeless tobacco: Not on file  . Alcohol Use: 16.8 oz/week    28 Cans of beer per week    Review of Systems  Constitutional: Negative for fever, chills, activity change, appetite change and fatigue.  Eyes: Negative for photophobia and visual disturbance.   Respiratory: Negative for cough and shortness of breath.   Cardiovascular: Negative for chest pain and leg swelling.  Gastrointestinal: Positive for diarrhea. Negative for nausea, vomiting, abdominal pain, constipation and blood in stool.  Genitourinary: Negative for dysuria, hematuria, decreased urine volume and difficulty urinating.  Musculoskeletal: Positive for back pain. Negative for arthralgias, gait problem, joint swelling, myalgias and neck pain.  Skin: Negative for wound.  Neurological: Positive for dizziness, syncope, light-headedness and headaches. Negative for weakness and numbness.    Allergies  Other  Home Medications   Prior to Admission medications   Medication Sig Start Date End Date Taking? Authorizing Provider  albuterol (PROVENTIL HFA;VENTOLIN HFA) 108 (90 BASE) MCG/ACT inhaler Inhale 1-2 puffs into the lungs every 6 (six) hours as needed for wheezing or shortness of breath (shortness of breath).   Yes Historical Provider, MD   BP 135/82  Pulse 60  Temp(Src) 97.7 F (36.5 C) (Oral)  SpO2 98%  Filed Vitals:   12/19/13 1444 12/19/13 1819 12/19/13 2014 12/19/13 2121  BP: 135/82 98/68 101/69 105/75  Pulse: 60 60 64 65  Temp: 97.7 F (36.5 C)     TempSrc: Oral     Resp:  18 16 18   SpO2: 98% 99% 99% 99%    Physical Exam  Nursing note and vitals reviewed. Constitutional: He is oriented to person, place, and time. He appears well-nourished. No distress.  Thin  male  HENT:  Head: Normocephalic and atraumatic.  Right Ear: External ear normal.  Left Ear: External ear normal.  Nose: Nose normal.  Mouth/Throat: Oropharynx is clear and moist. No oropharyngeal exudate.  No tenderness to the scalp or face throughout. No palpable hematoma, step-offs, or lacerations throughout.  Tympanic membranes gray and translucent bilaterally.    Eyes: Conjunctivae and EOM are normal. Pupils are equal, round, and reactive to light. Right eye exhibits no discharge. Left eye  exhibits no discharge.  Mild horizontal nystagmus bilaterally  Neck: Normal range of motion. Neck supple.  No cervical spinal or paraspinal tenderness to palpation throughout.  No limitations with neck ROM.    Cardiovascular: Normal rate, regular rhythm, normal heart sounds and intact distal pulses.  Exam reveals no gallop and no friction rub.   No murmur heard. Dorsalis pedis pulses present and equal bilaterally  Pulmonary/Chest: Effort normal. No respiratory distress. He has wheezes. He has no rales. He exhibits no tenderness.  Mild expiratory wheeze intermittent throughout  Abdominal: Soft. Bowel sounds are normal. He exhibits no distension and no mass. There is no tenderness. There is no rebound and no guarding.  Musculoskeletal: Normal range of motion. He exhibits tenderness. He exhibits no edema.  Strength 5/5 in the upper and lower extremities bilaterally. ROM intact in the UE and LE. Tenderness to palpation to the left lower paraspinal muscles diffusely. No lumbar spinal tenderness. Patient able to ambulate without difficulty although has mild ataxia  Neurological: He is alert and oriented to person, place, and time.  GCS 15. No focal neurological deficits. CN 2-12 intact.  No pronator drift. Finger to nose intact. Heel to shin intact.    Skin: Skin is warm and dry. He is not diaphoretic.     ED Course  Procedures (including critical care time) Labs Review Labs Reviewed - No data to display  Imaging Review Ct Head Wo Contrast  12/19/2013   CLINICAL DATA:  Dizziness, back pain  EXAM: CT HEAD WITHOUT CONTRAST  TECHNIQUE: Contiguous axial images were obtained from the base of the skull through the vertex without intravenous contrast.  COMPARISON:  09/06/2013  FINDINGS: There is no evidence of mass effect, midline shift, or extra-axial fluid collections. There is no evidence of a space-occupying lesion or intracranial hemorrhage. There is no evidence of a cortical-based area of acute  infarction. There is generalized cerebral atrophy. There is periventricular white matter low attenuation likely secondary to microangiopathy.  The ventricles and sulci are appropriate for the patient's age. The basal cisterns are patent.  Visualized portions of the orbits are unremarkable. The visualized portions of the paranasal sinuses and mastoid air cells are unremarkable.  The osseous structures are unremarkable.  IMPRESSION: No acute intracranial pathology.   Electronically Signed   By: Elige Ko   On: 12/19/2013 15:56     EKG Interpretation   Date/Time:  Friday December 19 2013 15:59:05 EDT Ventricular Rate:  71 PR Interval:  155 QRS Duration: 94 QT Interval:  441 QTC Calculation: 479 R Axis:   90 Text Interpretation:  Sinus rhythm axis if shifted rightwards Nonspecific  T abnormalities, lateral leads artifact Borderline prolonged QT interval  Confirmed by Karma Ganja  MD, MARTHA [phone]) on 12/19/2013 6:12:28 PM      Results for orders placed during the hospital encounter of 12/19/13  TROPONIN I      Result Value Ref Range   Troponin I <0.30  <0.30 ng/mL  CBC WITH DIFFERENTIAL  Result Value Ref Range   WBC 5.0  4.0 - 10.5 K/uL   RBC 3.82 (*) 4.22 - 5.81 MIL/uL   Hemoglobin 13.3  13.0 - 17.0 g/dL   HCT 16.1 (*) 09.6 - 04.5 %   MCV 100.8 (*) 78.0 - 100.0 fL   MCH 34.8 (*) 26.0 - 34.0 pg   MCHC 34.5  30.0 - 36.0 g/dL   RDW 40.9  81.1 - 91.4 %   Platelets 165  150 - 400 K/uL   Neutrophils Relative % 35 (*) 43 - 77 %   Neutro Abs 1.8  1.7 - 7.7 K/uL   Lymphocytes Relative 54 (*) 12 - 46 %   Lymphs Abs 2.7  0.7 - 4.0 K/uL   Monocytes Relative 8  3 - 12 %   Monocytes Absolute 0.4  0.1 - 1.0 K/uL   Eosinophils Relative 2  0 - 5 %   Eosinophils Absolute 0.1  0.0 - 0.7 K/uL   Basophils Relative 1  0 - 1 %   Basophils Absolute 0.1  0.0 - 0.1 K/uL  COMPREHENSIVE METABOLIC PANEL      Result Value Ref Range   Sodium 141  137 - 147 mEq/L   Potassium 3.8  3.7 - 5.3 mEq/L   Chloride  104  96 - 112 mEq/L   CO2 23  19 - 32 mEq/L   Glucose, Bld 77  70 - 99 mg/dL   BUN 6  6 - 23 mg/dL   Creatinine, Ser 7.82  0.50 - 1.35 mg/dL   Calcium 9.3  8.4 - 95.6 mg/dL   Total Protein 7.5  6.0 - 8.3 g/dL   Albumin 4.0  3.5 - 5.2 g/dL   AST 213 (*) 0 - 37 U/L   ALT 69 (*) 0 - 53 U/L   Alkaline Phosphatase 59  39 - 117 U/L   Total Bilirubin 0.3  0.3 - 1.2 mg/dL   GFR calc non Af Amer >90  >90 mL/min   GFR calc Af Amer >90  >90 mL/min   Anion gap 14  5 - 15  URINALYSIS, ROUTINE W REFLEX MICROSCOPIC      Result Value Ref Range   Color, Urine YELLOW  YELLOW   APPearance CLEAR  CLEAR   Specific Gravity, Urine 1.003 (*) 1.005 - 1.030   pH 5.0  5.0 - 8.0   Glucose, UA NEGATIVE  NEGATIVE mg/dL   Hgb urine dipstick NEGATIVE  NEGATIVE   Bilirubin Urine NEGATIVE  NEGATIVE   Ketones, ur NEGATIVE  NEGATIVE mg/dL   Protein, ur NEGATIVE  NEGATIVE mg/dL   Urobilinogen, UA 0.2  0.0 - 1.0 mg/dL   Nitrite NEGATIVE  NEGATIVE   Leukocytes, UA NEGATIVE  NEGATIVE  ETHANOL      Result Value Ref Range   Alcohol, Ethyl (B) 318 (*) 0 - 11 mg/dL     MDM   MAICOL BOWLAND is a 53 y.o. male with a PMH of asthma and HTN who presents to the ED for evaluation of alcohol intoxication, back pain, and dizziness. Patient found to be intoxicated with a BAL of 318. Patient able to ambulate without difficulty or ataxia before discharge. Patient also may have had a syncopal episode PTA. This may be due to alcohol consumption vs dehydration. Head CT negative for an acute intracranial process. No focal neurological deficits. Nystagmus likely due to alcohol intoxication. Doubt cardiac causes. No chest pain or SOB. EKG negative for any acute ischemic changes. Patient's labs  revealed elevated liver enzymes (AST 193 and ALT 69). Instructed patient to follow-up on this. Patient complained of chronic unchanged lower back pain. No warning signs/symptoms of back pain. No concern for cauda equina, epidural abscess, or other  serious/life threatening cause of back pain. Patient had mild stable hypotension. Patient asymptomatic at discharge. Return precautions, discharge instructions, and follow-up was discussed with the patient before discharge.     Rechecks  6:40 PM = Patient sleeping when I entered the room. No complaints. Lungs clear to auscultation. 8:00 PM = Patient asymptomatic. Ready for discharge.    Discharge Medication List as of 12/19/2013  8:46 PM      Final impressions: 1. Alcohol intoxication, uncomplicated   2. Syncope, unspecified syncope type   3. Elevated liver enzymes   4. Chronic back pain       Luiz IronJessica Katlin Milanni Ayub PA-C   This patient was discussed with Dr. Nonie HoyerLinker          Kallon Caylor K Cara Aguino, PA-C 12/19/13 2256

## 2013-12-26 ENCOUNTER — Emergency Department (HOSPITAL_COMMUNITY): Payer: No Typology Code available for payment source

## 2013-12-26 ENCOUNTER — Encounter (HOSPITAL_COMMUNITY): Payer: Self-pay | Admitting: Emergency Medicine

## 2013-12-26 ENCOUNTER — Emergency Department (HOSPITAL_COMMUNITY)
Admission: EM | Admit: 2013-12-26 | Discharge: 2013-12-26 | Disposition: A | Discharge status: Home / Self Care | Payer: No Typology Code available for payment source | Attending: Emergency Medicine | Admitting: Emergency Medicine

## 2013-12-26 DIAGNOSIS — J45901 Unspecified asthma with (acute) exacerbation: Secondary | ICD-10-CM | POA: Insufficient documentation

## 2013-12-26 DIAGNOSIS — F17200 Nicotine dependence, unspecified, uncomplicated: Secondary | ICD-10-CM | POA: Insufficient documentation

## 2013-12-26 DIAGNOSIS — Z79899 Other long term (current) drug therapy: Secondary | ICD-10-CM | POA: Insufficient documentation

## 2013-12-26 DIAGNOSIS — G8929 Other chronic pain: Secondary | ICD-10-CM | POA: Insufficient documentation

## 2013-12-26 DIAGNOSIS — I10 Essential (primary) hypertension: Secondary | ICD-10-CM | POA: Insufficient documentation

## 2013-12-26 HISTORY — DX: Other chronic pain: G89.29

## 2013-12-26 HISTORY — DX: Pain in unspecified knee: M25.569

## 2013-12-26 HISTORY — DX: Alcohol abuse, uncomplicated: F10.10

## 2013-12-26 HISTORY — DX: Dorsalgia, unspecified: M54.9

## 2013-12-26 MED ORDER — PREDNISONE 20 MG PO TABS: 40.0000 mg | ORAL_TABLET | Freq: Every day | ORAL | Status: DC

## 2013-12-26 MED ORDER — ALBUTEROL SULFATE HFA 108 (90 BASE) MCG/ACT IN AERS: 2.0000 | INHALATION_SPRAY | RESPIRATORY_TRACT | Status: DC | PRN

## 2013-12-26 MED ORDER — ALBUTEROL SULFATE (2.5 MG/3ML) 0.083% IN NEBU: 2.5000 mg | INHALATION_SOLUTION | Freq: Once | RESPIRATORY_TRACT | Status: DC

## 2013-12-26 MED ORDER — IPRATROPIUM-ALBUTEROL 0.5-2.5 (3) MG/3ML IN SOLN
3.0000 mL | Freq: Once | RESPIRATORY_TRACT | Status: AC
  Administered 2013-12-26: 3 mL via RESPIRATORY_TRACT
  Filled 2013-12-26: qty 3

## 2013-12-26 MED ORDER — ALBUTEROL SULFATE HFA 108 (90 BASE) MCG/ACT IN AERS
2.0000 | INHALATION_SPRAY | RESPIRATORY_TRACT | Status: AC
  Administered 2013-12-26: 2 via RESPIRATORY_TRACT
  Filled 2013-12-26: qty 6.7

## 2013-12-26 NOTE — ED Notes (Signed)
Bed: WA19 Expected date:  Expected time:  Means of arrival:  Comments: EMS- SOB 

## 2013-12-26 NOTE — ED Notes (Signed)
Per EMS: pt has asthma, c/o SOB, ran out 1-2 days ago of albuterol tx and hasn't had it since then. 125 mg albuterol in 18 g left forearm, 5 mg albuterol and 1 atrovent tx in route. Pt still having slight wheezing.

## 2013-12-26 NOTE — ED Provider Notes (Signed)
CSN: [id number]     Arrival date & time 12/26/13  1325 History   First MD Initiated Contact with Patient 12/26/13 1337     Chief Complaint  Patient presents with  . Shortness of Breath      HPI Pt was seen at 1345.  Per pt, c/o gradual onset and worsening of persistent cough, wheezing and SOB since this morning.  Describes his symptoms as "my asthma is acting up." Pt has ran out of his MDI 2 days ago.  EMS gave IV solumedrol and neb treatment in route with partial improvement of his symptoms. Denies CP/palpitations, no back pain, no abd pain, no N/V/D, no fevers, no rash.    Past Medical History  Diagnosis Date  . Asthma   . Hypertension   . Bronchitis   . Chronic back pain   . Alcohol abuse   . Chronic knee pain    History reviewed. No pertinent past surgical history.  Family History  Problem Relation Age of Onset  . Cancer Mother   . Cancer Sister    History  Substance Use Topics  . Smoking status: Current Every Day Smoker -- 1.00 packs/day    Types: Cigarettes  . Smokeless tobacco: Not on file  . Alcohol Use: 16.8 oz/week    28 Cans of beer per week    Review of Systems ROS: Statement: All systems negative except as marked or noted in the HPI; Constitutional: Negative for fever and chills. ; ; Eyes: Negative for eye pain, redness and discharge. ; ; ENMT: Negative for ear pain, hoarseness, nasal congestion, sinus pressure and sore throat. ; ; Cardiovascular: Negative for chest pain, palpitations, diaphoresis, and peripheral edema. ; ; Respiratory: +cough, wheezing, SOB. Negative for stridor. ; ; Gastrointestinal: Negative for nausea, vomiting, diarrhea, abdominal pain, blood in stool, hematemesis, jaundice and rectal bleeding. . ; ; Genitourinary: Negative for dysuria, flank pain and hematuria. ; ; Musculoskeletal: Negative for back pain and neck pain. Negative for swelling and trauma.; ; Skin: Negative for pruritus, rash, abrasions, blisters, bruising and skin lesion.; ;  Neuro: Negative for headache, lightheadedness and neck stiffness. Negative for weakness, altered level of consciousness , altered mental status, extremity weakness, paresthesias, involuntary movement, seizure and syncope.      Allergies  Other  Home Medications   Prior to Admission medications   Medication Sig Start Date End Date Taking? Authorizing Provider  albuterol (PROVENTIL HFA;VENTOLIN HFA) 108 (90 BASE) MCG/ACT inhaler Inhale 1-2 puffs into the lungs every 6 (six) hours as needed for wheezing or shortness of breath (shortness of breath).   Yes Historical Provider, MD   BP 125/78  Pulse 104  Temp(Src) 98.8 F (37.1 C) (Oral)  Resp 18  SpO2 95% Physical Exam 1350: Physical examination:  Nursing notes reviewed; Vital signs and O2 SAT reviewed;  Constitutional: Well developed, Well nourished, Well hydrated, In no acute distress; Head:  Normocephalic, atraumatic; Eyes: EOMI, PERRL, No scleral icterus; ENMT: Mouth and pharynx normal, Mucous membranes moist; Neck: Supple, Full range of motion, No lymphadenopathy; Cardiovascular: Regular rate and rhythm, No murmur, rub, or gallop; Respiratory: Breath sounds coarse & equal bilaterally, scattered exp wheezes. No audible wheezing. Speaking full sentences with ease, Normal respiratory effort/excursion; Chest: Nontender, Movement normal; Abdomen: Soft, Nontender, Nondistended, Normal bowel sounds; Genitourinary: No CVA tenderness; Extremities: Pulses normal, No tenderness, No edema, No calf edema or asymmetry.; Neuro: AA&Ox3, Major CN grossly intact.  Speech clear. No gross focal motor or sensory deficits in extremities.; Skin: Color  normal, Warm, Dry.   ED Course  Procedures     MDM  MDM Reviewed: previous chart, nursing note and vitals Reviewed previous: x-ray Interpretation: x-ray    Dg Chest Port 1 View 12/26/2013   CLINICAL DATA:  SHORTNESS OF BREATH  EXAM: PORTABLE CHEST - 1 VIEW  COMPARISON:  Two-view chest 09/06/2013   FINDINGS: The heart size and mediastinal contours are within normal limits. Lungs are hyperinflated and there is flattening of the hemidiaphragms. Both lungs are otherwise clear. The visualized skeletal structures are unremarkable.  IMPRESSION: COPD.  No active disease.   Electronically Signed   By: Salome Holmes M.D.   On: 12/26/2013 14:21    1545:  Pt states he "feels better" after neb and steroid.  NAD, lungs CTA bilat, no wheezing, resps easy, speaking full sentences, Sats 98% R/A. Pt wants to go home now. Dx and testing d/w pt.  Questions answered.  Verb understanding, agreeable to d/c home with outpt f/u.    Laray Anger, DO 12/29/13 2116

## 2013-12-26 NOTE — Progress Notes (Signed)
[facility] provided pt with a list of primary care resources and a [facility] [employer] Card application to help patient establish primary care.  °

## 2013-12-26 NOTE — Discharge Instructions (Signed)
°Emergency Department Resource Guide °1) Find a Doctor and Pay Out of Pocket °Although you won't have to find out who is covered by your insurance plan, it is a good idea to ask around and get recommendations. You will then need to call the office and see if the doctor you have chosen will accept you as a new patient and what types of options they offer for patients who are self-pay. Some doctors offer discounts or will set up payment plans for their patients who do not have insurance, but you will need to ask so you aren't surprised when you get to your appointment. ° °2) Contact Your Local Health Department °Not all health departments have doctors that can see patients for sick visits, but many do, so it is worth a call to see if yours does. If you don't know where your local health department is, you can check in your phone book. The CDC also has a tool to help you locate your state's health department, and many state websites also have listings of all of their local health departments. ° °3) Find a Walk-in Clinic °If your illness is not likely to be very severe or complicated, you may want to try a walk in clinic. These are popping up all over the country in pharmacies, drugstores, and shopping centers. They're usually staffed by nurse practitioners or physician assistants that have been trained to treat common illnesses and complaints. They're usually fairly quick and inexpensive. However, if you have serious medical issues or chronic medical problems, these are probably not your best option. ° °No Primary Care Doctor: °- Call [facility] at  832-8000 - they can help you locate a primary care doctor that  accepts your insurance, provides certain services, etc. °- Physician Referral Service- 1-800-533-3463 ° °Chronic Pain Problems: °Organization         Address  Phone   Notes  °[facility]  (336) 297-2271 Patients need to be referred by their primary care doctor.  ° °Medication  Assistance: °Organization         Address  Phone   Notes  °[place] [facility] [address] °[place], [place] [address] (336) 641-8030 --Must be a resident of [place] °-- Must have NO insurance coverage whatsoever (no Medicaid/ Medicare, etc.) °-- The pt. MUST have a primary care doctor that directs their care regularly and follows them in the community °  °[facility]  (866) 331-1348   °[employer]  (888) 892-1162   ° °Agencies that provide inexpensive medical care: °Organization         Address  Phone   Notes  °[facility]  (336) 832-8035   °[facility]    (336) 832-7272   °[facility] [address] °[place], [place] [address] (336) 832-4777   °[facility] [address], °[place] (336) 271-4999   °[facility]    (336) 373-0678   °[facility]    (336) 272-1050   °[facility] ° [address], [place] Phone:  (336) 832-4444, Fax:  (336) 832-4440 Hours of Operation:  9 am - 6 pm, M-F.  Also accepts Medicaid/Medicare and self-pay.  °[facility] ° [address], [place] Phone: (336) 832-3150, Fax: (336) 832-3151. Hours of Operation:  8:30 am - 5:30 pm, M-F.  Also accepts Medicaid and self-pay.  °[facility] [place] [address], [place] Phone: (336) 878-6027   °[facility] [address], [place], [place] (336)723-1848, Ext. 123 Mondays & Thursdays: 7-9 AM.  First 15 patients are seen on a first come, first serve basis. °  ° °Medicaid-accepting [place] Providers: ° °Organization         Address  Phone   Notes  °[facility] [address], [place] (336) 641-2100 Also accepts self-pay patients.  °[facility] [address], [place] ° (336) 856-9996   °[facility] [address], [place]  (336) 288-8857   °[facility] [address] [place] [address], [place] (336) 299-7000   °Veita Bland [address], [place]  ° (336) 373-1557 Only accepts [place] Access Medicaid patients after they have their name applied to their card.  ° °Self-Pay (no insurance) in [place]: ° °Organization         Address  Phone   Notes  °Sickle Cell Patients, [facility] [address], [place] (336) 832-1970   °[facility] [address], [place] (336) 832-4400   °[facility] ° [address], [place] (336) 992-4800   °[facility]/Dr. Osei-Bonsu ° [address] [place] [address], [place] or [address], [place] (336) 841-8500 Phone number for both [place] and [place] locations is the same.  °[facility] [address], [place] (336) 299-0000   °[facility] [address] [place] [address], [place] or [address] (336) 852-7530 °(336) 878-2260   °[facility] [address], [place] (336) 350-1642, phone; (336) 294-5005, fax Sees patients 1st and 3rd Saturday of every month.  Must not qualify for public or private insurance (i.e. Medicaid, Medicare, [place] Health Choice, Veterans' Benefits) • Household income should be no more than 200% of the poverty level •The clinic cannot treat you if you are pregnant or think you are pregnant • Sexually transmitted diseases are not treated at the clinic.  ° ° °Dental Care: °Organization         Address  Phone  Notes  °[place] [facility] [address], [place] (336) 641-6152 Accepts children up to age 21 who are enrolled in Medicaid or [place] Health Choice; pregnant women with a Medicaid card; and children who have applied for Medicaid or [place] Health Choice, but were declined, whose parents can pay a reduced fee at time of service.  °[place]  [facility] [place]  [address], [place] (336) 641-7733 Accepts children up to age 21 who are enrolled in Medicaid or [place] Health Choice; pregnant women with a Medicaid card; and children who have applied for Medicaid or [place] Health Choice, but were declined, whose parents can pay a reduced fee at time of service.  °[facility] ° [address], [place] (336) 641-4533 Patients are seen by appointment only. Walk-ins are not accepted. [facility] will see patients 18 years of age and older. °Monday - Tuesday (8am-5pm) °Most Wednesdays (8:30-5pm) °$30 per visit, cash only  °[facility] ° [address], [place] (336) 641-4533 Patients are seen by appointment only. Walk-ins are not accepted. [facility] will see patients 18 years of age and older. °One   Wednesday Evening (Monthly: Volunteer Based).  $30 per visit, cash only  °[facility]  (919) 537-3737 for adults; Children under age 4, call Graduate Pediatric Dentistry at (919) [phone]. Children aged 4-14, please call (919) [phone] to request a pediatric application. ° Dental services are provided in all areas of dental care including fillings, crowns and bridges, complete and partial dentures, implants, gum treatment, root canals, and extractions. Preventive care is also provided. Treatment is provided to both adults and children. °Patients are selected via a lottery and there is often a waiting list. °  °[facility] [address], °[place] ° (336) 763-8833 [email or web address] °  °[facility] [address], [place], [place] (336)723-1848, Ext. 123 Second and Fourth Thursday of each month, opens at 6:30 AM; Clinic ends at 9 AM.  Patients are seen on a first-come first-served basis, and a limited number are seen during each clinic.  ° °[facility] ° [address], [place], [place] (336) 723-7904    Eligibility Requirements °You must have lived in [place], or [place] counties for at least the last three months. °  You cannot be eligible for state or federal sponsored healthcare insurance, including Veterans Administration, Medicaid, or Medicare. °  You generally cannot be eligible for healthcare insurance through your employer.  °  How to apply: °Eligibility screenings are held every Tuesday and Wednesday afternoon from 1:00 pm until 4:00 pm. You do not need an appointment for the interview!  °[facility] [address], [place], [place] 336-631-2330   °[facility]  336-342-8273   °[facility]  336-703-3100   °[facility]  336-570-6415   ° °Behavioral Health Resources in the Community: °Intensive Outpatient Programs °Organization         Address  Phone  Notes  °[facility] [address], [place], [place] 336-878-6098   °[facility] [address], [place], [place] 336-832-9800   °[facility] [address], [place], [place] ° 336-882-2125   °[facility] [address],  °[place], [place] 1-800-853-5163 or 336-641-4981   °Substance Abuse Resources °Organization         Address  Phone  Notes  °[facility]  336-882-2125   °[facility]  336-784-9470   °[facility]  336-285-9073   °[facility]  336-845-3988   °Residential & Outpatient Substance Abuse Program  1-800-659-3381   °Psychological Services °Organization         Address  Phone  Notes  °[facility]  336- 832-9600   °[facility]  336- 378-7881   °[facility] [address], [place] 1-800-853-5163 or 336-641-4981   ° °Mobile Crisis Teams °Organization         Address  Phone  Notes  °[facility], Mobile Crisis Care Unit  1-877-626-1772   °Assertive °[facility] ° [address]  [place], [place] 336-834-9664   °Sharon DeEsch [address] °[place] 336-554-5454   ° °Self-Help/Support Groups °Organization         Address  Phone             Notes  °[facility]  - variety of support groups  336- 373-1402 Call for more information  °Narcotics Anonymous (NA), [facility] [address], °[place]  2 meetings at this location  ° °  Residential Treatment Programs °Organization         Address  Phone  Notes  °[facility] [address],    °[place]  1-866-801-8205   °[facility] ° [address], [place], [place] 704-293-8524   °[facility] [address], [place] 336-845-3988 Admissions: 8am-3pm M-F  °[facility] [address]    °[place], [place] 336-841-1104   °[facility] [address], [place], [place] 336-379-7146   °[facility] [address]  °[place], [place] 336-285-9073   °[facility] Intensive Outpatient [address], [place], [place] 336-852-3033   °[facility] [address]  °[place], [place] 1-877-615-2722 or 336-784-9470   °[facility] [address] [place], [place] 336-227-7417 Accepts Medicaid  °[facility] [address]  °[place] 1-800-659-3381 Substance Abuse/Addiction Treatment  ° °[place] Behavioral Health Resources °Organization         Address  Phone  Notes  °[facility]  (888) 581-9988   °Julie Brannon, PhD [address] [place], [place]   (336) 349-5553 or (336) 951-0000   °[facility]   [address] °[place], [place] (336) 349-4454   °[facility] [address], [place], [place] (336) 342-8316 Insurance/Medicaid/sponsorship through [facility]  °[facility] [address]                                    [place], [place] (336) 342-8316 Therapy/tele-psych/case    °[facility] [address]  ° [place] (336) 349-2233    °Dr. Arfeen  (336) 349-4544   °[facility] [place]  [employer] [place] [facility] 1) [address], [place] °2) [address], [place] °3)  [address], [place] (336) 349-3220 °(336) 342-7768 ° °(336) 342-8140   °[place] [facility] (336) 342-1394 or (336) 342-3537 (After Hours)    ° ° °Take the prescriptions as directed.  Use your albuterol inhaler (2 to 4 puffs) every 4 hours for the next 7 days, then as needed for cough, wheezing, or shortness of breath.  Call your regular medical doctor today to schedule a follow up appointment within the next 3 days.  Return to the Emergency Department immediately sooner if worsening.  ° °

## 2014-03-26 ENCOUNTER — Encounter (HOSPITAL_COMMUNITY): Payer: Self-pay | Admitting: Emergency Medicine

## 2014-03-26 DIAGNOSIS — J45909 Unspecified asthma, uncomplicated: Secondary | ICD-10-CM | POA: Insufficient documentation

## 2014-03-26 DIAGNOSIS — Z8659 Personal history of other mental and behavioral disorders: Secondary | ICD-10-CM | POA: Insufficient documentation

## 2014-03-26 DIAGNOSIS — G8929 Other chronic pain: Secondary | ICD-10-CM | POA: Insufficient documentation

## 2014-03-26 DIAGNOSIS — Z720 Tobacco use: Secondary | ICD-10-CM | POA: Insufficient documentation

## 2014-03-26 DIAGNOSIS — M545 Low back pain: Secondary | ICD-10-CM | POA: Insufficient documentation

## 2014-03-26 DIAGNOSIS — I10 Essential (primary) hypertension: Secondary | ICD-10-CM | POA: Insufficient documentation

## 2014-03-26 LAB — COMPREHENSIVE METABOLIC PANEL
ALT: 19 U/L (ref 0–53)
AST: 44 U/L — AB (ref 0–37)
Albumin: 3.9 g/dL (ref 3.5–5.2)
Alkaline Phosphatase: 61 U/L (ref 39–117)
Anion gap: 15 (ref 5–15)
BUN: 9 mg/dL (ref 6–23)
CALCIUM: 8.8 mg/dL (ref 8.4–10.5)
CO2: 23 meq/L (ref 19–32)
CREATININE: 0.86 mg/dL (ref 0.50–1.35)
Chloride: 102 mEq/L (ref 96–112)
Glucose, Bld: 104 mg/dL — ABNORMAL HIGH (ref 70–99)
Potassium: 3.6 mEq/L — ABNORMAL LOW (ref 3.7–5.3)
Sodium: 140 mEq/L (ref 137–147)
Total Bilirubin: <0.2 mg/dL — ABNORMAL LOW (ref 0.3–1.2)
Total Protein: 8 g/dL (ref 6.0–8.3)

## 2014-03-26 LAB — CBC WITH DIFFERENTIAL/PLATELET
BASOS ABS: 0 10*3/uL (ref 0.0–0.1)
Basophils Relative: 0 % (ref 0–1)
EOS ABS: 0.1 10*3/uL (ref 0.0–0.7)
EOS PCT: 1 % (ref 0–5)
HCT: 37.7 % — ABNORMAL LOW (ref 39.0–52.0)
Hemoglobin: 13.1 g/dL (ref 13.0–17.0)
LYMPHS ABS: 3.3 10*3/uL (ref 0.7–4.0)
Lymphocytes Relative: 39 % (ref 12–46)
MCH: 35 pg — AB (ref 26.0–34.0)
MCHC: 34.7 g/dL (ref 30.0–36.0)
MCV: 100.8 fL — ABNORMAL HIGH (ref 78.0–100.0)
Monocytes Absolute: 0.5 10*3/uL (ref 0.1–1.0)
Monocytes Relative: 6 % (ref 3–12)
Neutro Abs: 4.5 10*3/uL (ref 1.7–7.7)
Neutrophils Relative %: 54 % (ref 43–77)
PLATELETS: 188 10*3/uL (ref 150–400)
RBC: 3.74 MIL/uL — ABNORMAL LOW (ref 4.22–5.81)
RDW: 13.2 % (ref 11.5–15.5)
WBC: 8.4 10*3/uL (ref 4.0–10.5)

## 2014-03-26 NOTE — ED Notes (Signed)
Pt. Reports lefty flank pain for 2 weeks , denies injury or fall , no hematuria or dysuria , pt. also stated bilateral knee weakness " it gave out on me" for several years , ambulatory .

## 2014-03-27 ENCOUNTER — Emergency Department (HOSPITAL_COMMUNITY)
Admission: EM | Admit: 2014-03-27 | Discharge: 2014-03-27 | Disposition: A | Discharge status: Home / Self Care | Payer: No Typology Code available for payment source | Attending: Emergency Medicine | Admitting: Emergency Medicine

## 2014-03-27 DIAGNOSIS — J45909 Unspecified asthma, uncomplicated: Secondary | ICD-10-CM

## 2014-03-27 DIAGNOSIS — M545 Low back pain, unspecified: Secondary | ICD-10-CM

## 2014-03-27 DIAGNOSIS — F17200 Nicotine dependence, unspecified, uncomplicated: Secondary | ICD-10-CM

## 2014-03-27 LAB — URINALYSIS, ROUTINE W REFLEX MICROSCOPIC
BILIRUBIN URINE: NEGATIVE
Glucose, UA: NEGATIVE mg/dL
Hgb urine dipstick: NEGATIVE
Ketones, ur: NEGATIVE mg/dL
Leukocytes, UA: NEGATIVE
Nitrite: NEGATIVE
PROTEIN: NEGATIVE mg/dL
Specific Gravity, Urine: 1.015 (ref 1.005–1.030)
UROBILINOGEN UA: 0.2 mg/dL (ref 0.0–1.0)
pH: 5 (ref 5.0–8.0)

## 2014-03-27 MED ORDER — METHOCARBAMOL 750 MG PO TABS: 750.0000 mg | ORAL_TABLET | Freq: Four times a day (QID) | ORAL | Status: DC | PRN

## 2014-03-27 MED ORDER — ALBUTEROL SULFATE HFA 108 (90 BASE) MCG/ACT IN AERS
2.0000 | INHALATION_SPRAY | RESPIRATORY_TRACT | Status: DC | PRN
Start: 2014-03-27 — End: 2021-08-29

## 2014-03-27 MED ORDER — TRAMADOL HCL 50 MG PO TABS: 50.0000 mg | ORAL_TABLET | Freq: Four times a day (QID) | ORAL | Status: DC | PRN

## 2014-03-27 MED ORDER — FLUTICASONE-SALMETEROL 100-50 MCG/DOSE IN AEPB: 1.0000 | INHALATION_SPRAY | Freq: Two times a day (BID) | RESPIRATORY_TRACT | Status: DC

## 2014-03-27 MED ORDER — TRAMADOL HCL 50 MG PO TABS
50.0000 mg | ORAL_TABLET | Freq: Once | ORAL | Status: AC
  Administered 2014-03-27: 50 mg via ORAL
  Filled 2014-03-27: qty 1

## 2014-03-27 MED ORDER — METHOCARBAMOL 500 MG PO TABS
1000.0000 mg | ORAL_TABLET | Freq: Once | ORAL | Status: AC
  Administered 2014-03-27: 1000 mg via ORAL
  Filled 2014-03-27: qty 2

## 2014-03-27 NOTE — ED Provider Notes (Signed)
CSN: [id number]     Arrival date & time 03/26/14  2256 History   First MD Initiated Contact with Patient 03/27/14 [phone]     Chief Complaint  Patient presents with  . Flank Pain     (Consider location/radiation/quality/duration/timing/severity/associated sxs/prior Treatment) HPI [age] yo male presents to the ER from home with multiple complaints.  Pt reports he has had bilateral knee weakness intermittently for years.  He uses cane sometimes to help with balance.  No recent falls, but sometimes notices his knees buckle.  No pain associated with it.  Pt reports his asthma acts up when he walks short distances.  Pt smokes 1-2 ppd, and occasionally uses his asthma medications.  He reports when he walks about 50-100 feet he becomes winded.  He denies wheezing or change in sputum.  No orthopnea or PND, no history of chf.  Pt also c/o left flank pain for 2 weeks.  No trauma to the area.  No dysuria or hematuria.  No history of kidney stones.  Pain worse with movement and palpation of the area.  No bowel or bladder dysfunction, no new focal weakness. Past Medical History  Diagnosis Date  . Asthma   . Hypertension   . Bronchitis   . Chronic back pain   . Alcohol abuse   . Chronic knee pain    History reviewed. No pertinent past surgical history. Family History  Problem Relation Age of Onset  . Cancer Mother   . Cancer Sister    History  Substance Use Topics  . Smoking status: Current Every Day Smoker -- 1.00 packs/day    Types: Cigarettes  . Smokeless tobacco: Not on file  . Alcohol Use: 16.8 oz/week    28 Cans of beer per week    Review of Systems  All other systems reviewed and are negative.     Allergies  Other  Home Medications   Prior to Admission medications   Medication Sig Start Date End Date Taking? Authorizing Provider  albuterol (PROVENTIL HFA;VENTOLIN HFA) 108 (90 BASE) MCG/ACT inhaler Inhale 2 puffs into the lungs every 4 (four) hours as needed for wheezing or  shortness of breath. 03/27/14   Olivia Mackielga M Halona Amstutz, MD  Fluticasone-Salmeterol (ADVAIR) 100-50 MCG/DOSE AEPB Inhale 1 puff into the lungs 2 (two) times daily. 03/27/14   Olivia Mackielga M Kasper Mudrick, MD  methocarbamol (ROBAXIN-750) 750 MG tablet Take 1 tablet (750 mg total) by mouth every 6 (six) hours as needed for muscle spasms. 03/27/14   Olivia Mackielga M Sadiya Durand, MD  traMADol (ULTRAM) 50 MG tablet Take 1 tablet (50 mg total) by mouth every 6 (six) hours as needed. 03/27/14   Olivia Mackielga M Sevyn Paredez, MD   BP 104/75  Pulse 79  Temp(Src) 97.5 F (36.4 C) (Oral)  Resp 22  SpO2 95% Physical Exam  Nursing note and vitals reviewed. Constitutional: He is oriented to person, place, and time. He appears well-developed and well-nourished.  HENT:  Head: Normocephalic and atraumatic.  Nose: Nose normal.  Mouth/Throat: Oropharynx is clear and moist.  Eyes: Conjunctivae and EOM are normal. Pupils are equal, round, and reactive to light.  Neck: Normal range of motion. Neck supple. No JVD present. No tracheal deviation present. No thyromegaly present.  Cardiovascular: Normal rate, regular rhythm, normal heart sounds and intact distal pulses.  Exam reveals no gallop and no friction rub.   No murmur heard. Pulmonary/Chest: Effort normal and breath sounds normal. No stridor. No respiratory distress. He has no wheezes. He has no rales. He  exhibits no tenderness.  Abdominal: Soft. Bowel sounds are normal. He exhibits no distension and no mass. There is no tenderness. There is no rebound and no guarding.  Musculoskeletal: Normal range of motion. He exhibits tenderness (ttp over left paraspinal muscles). He exhibits no edema.  No knee effusion, no ttp, normal ROM  Lymphadenopathy:    He has no cervical adenopathy.  Neurological: He is alert and oriented to person, place, and time. He displays normal reflexes. He exhibits normal muscle tone. Coordination normal.  Skin: Skin is warm and dry. No rash noted. No erythema. No pallor.  Psychiatric: He has a  normal mood and affect. His behavior is normal. Judgment and thought content normal.    ED Course  Procedures (including critical care time) Labs Review Labs Reviewed  URINALYSIS, ROUTINE W REFLEX MICROSCOPIC - Abnormal; Notable for the following:    APPearance CLOUDY (*)    All other components within normal limits  CBC WITH DIFFERENTIAL - Abnormal; Notable for the following:    RBC 3.74 (*)    HCT 37.7 (*)    MCV 100.8 (*)    MCH 35.0 (*)    All other components within normal limits  COMPREHENSIVE METABOLIC PANEL - Abnormal; Notable for the following:    Potassium 3.6 (*)    Glucose, Bld 104 (*)    AST 44 (*)    Total Bilirubin <0.2 (*)    All other components within normal limits    Imaging Review No results found.   EKG Interpretation None      MDM   Final diagnoses:  Left-sided low back pain without sciatica  Asthma, chronic, unspecified asthma severity, uncomplicated  Smoker    53 yo male with chronic left back pain, no bowel/bladder problems, no new weakness.  Years of lower leg weakness.  Pt counseled on smoking, alcohol use.  Exam unremarkable.    Olivia Mackielga M Airam Runions, MD 03/27/14 [phone]

## 2014-03-27 NOTE — Discharge Instructions (Signed)
Back Exercises Back exercises help treat and prevent back injuries. The goal is to increase your strength in your belly (abdominal) and back muscles. These exercises can also help with flexibility. Start these exercises when told by your doctor. HOME CARE Back exercises include: Pelvic Tilt.  Lie on your back with your knees bent. Tilt your pelvis until the lower part of your back is against the floor. Hold this position 5 to 10 sec. Repeat this exercise 5 to 10 times. Knee to Chest.  Pull 1 knee up against your chest and hold for 20 to 30 seconds. Repeat this with the other knee. This may be done with the other leg straight or bent, whichever feels better. Then, pull both knees up against your chest. Sit-Ups or Curl-Ups.  Bend your knees 90 degrees. Start with tilting your pelvis, and do a partial, slow sit-up. Only lift your upper half 30 to 45 degrees off the floor. Take at least 2 to 3 seonds for each sit-up. Do not do sit-ups with your knees out straight. If partial sit-ups are difficult, simply do the above but with only tightening your belly (abdominal) muscles and holding it as told. Hip-Lift.  Lie on your back with your knees flexed 90 degrees. Push down with your feet and shoulders as you raise your hips 2 inches off the floor. Hold for 10 seconds, repeat 5 to 10 times. Back Arches.  Lie on your stomach. Prop yourself up on bent elbows. Slowly press on your hands, causing an arch in your low back. Repeat 3 to 5 times. Shoulder-Lifts.  Lie face down with arms beside your body. Keep hips and belly pressed to floor as you slowly lift your head and shoulders off the floor. Do not overdo your exercises. Be careful in the beginning. Exercises may cause you some mild back discomfort. If the pain lasts for more than 15 minutes, stop the exercises until you see your doctor. Improvement with exercise for back problems is slow.  Document Released: 07/08/2010 Document Revised: 08/28/2011  Document Reviewed: 04/06/2011 [facility] Patient Information 2015 [place], [place]. This information is not intended to replace advice given to you by your health care provider. Make sure you discuss any questions you have with your health care provider.  Back Pain, Adult Low back pain is very common. About 1 in 5 people have back pain.The cause of low back pain is rarely dangerous. The pain often gets better over time.About half of people with a sudden onset of back pain feel better in just 2 weeks. About 8 in 10 people feel better by 6 weeks.  CAUSES Some common causes of back pain include:  Strain of the muscles or ligaments supporting the spine.  Wear and tear (degeneration) of the spinal discs.  Arthritis.  Direct injury to the back. DIAGNOSIS Most of the time, the direct cause of low back pain is not known.However, back pain can be treated effectively even when the exact cause of the pain is unknown.Answering your caregiver's questions about your overall health and symptoms is one of the most accurate ways to make sure the cause of your pain is not dangerous. If your caregiver needs more information, he or she may order lab work or imaging tests (X-rays or MRIs).However, even if imaging tests show changes in your back, this usually does not require surgery. HOME CARE INSTRUCTIONS For many people, back pain returns.Since low back pain is rarely dangerous, it is often a condition that people can learn to [facility]  their own.   Remain active. It is stressful on the back to sit or stand in one place. Do not sit, drive, or stand in one place for more than 30 minutes at a time. Take short walks on level surfaces as soon as pain allows.Try to increase the length of time you walk each day.  Do not stay in bed.Resting more than 1 or 2 days can delay your recovery.  Do not avoid exercise or work.Your body is made to move.It is not dangerous to be active, even though your back may  hurt.Your back will likely heal faster if you return to being active before your pain is gone.  Pay attention to your body when you bend and lift. Many people have less discomfortwhen lifting if they bend their knees, keep the load close to their bodies,and avoid twisting. Often, the most comfortable positions are those that put less stress on your recovering back.  Find a comfortable position to sleep. Use a firm mattress and lie on your side with your knees slightly bent. If you lie on your back, put a pillow under your knees.  Only take over-the-counter or prescription medicines as directed by your caregiver. Over-the-counter medicines to reduce pain and inflammation are often the most helpful.Your caregiver may prescribe muscle relaxant drugs.These medicines help dull your pain so you can more quickly return to your normal activities and healthy exercise.  Put ice on the injured area.  Put ice in a plastic bag.  Place a towel between your skin and the bag.  Leave the ice on for 15-20 minutes, 03-04 times a day for the first 2 to 3 days. After that, ice and heat may be alternated to reduce pain and spasms.  Ask your caregiver about trying back exercises and gentle massage. This may be of some benefit.  Avoid feeling anxious or stressed.Stress increases muscle tension and can worsen back pain.It is important to recognize when you are anxious or stressed and learn ways to manage it.Exercise is a great option. SEEK MEDICAL CARE IF:  You have pain that is not relieved with rest or medicine.  You have pain that does not improve in 1 week.  You have new symptoms.  You are generally not feeling well. SEEK IMMEDIATE MEDICAL CARE IF:   You have pain that radiates from your back into your legs.  You develop new bowel or bladder control problems.  You have unusual weakness or numbness in your arms or legs.  You develop nausea or vomiting.  You develop abdominal pain.  You  feel faint. Document Released: 06/05/2005 Document Revised: 12/05/2011 Document Reviewed: 10/07/2013 [facility] Patient Information 2015 [place], [place]. This information is not intended to replace advice given to you by your health care provider. Make sure you discuss any questions you have with your health care provider.  Heat Therapy Heat therapy can help ease sore, stiff, injured, and tight muscles and joints. Heat relaxes your muscles, which may help ease your pain.  RISKS AND COMPLICATIONS If you have any of the following conditions, do not use heat therapy unless your health care provider has approved:  Poor circulation.  Healing wounds or scarred skin in the area being treated.  Diabetes, heart disease, or high blood pressure.  Not being able to feel (numbness) the area being treated.  Unusual swelling of the area being treated.  Active infections.  Blood clots.  Cancer.  Inability to communicate pain. This may include young children and people who have problems  with their brain function (dementia).  Pregnancy. Heat therapy should only be used on old, pre-existing, or long-lasting (chronic) injuries. Do not use heat therapy on new injuries unless directed by your health care provider. HOW TO USE HEAT THERAPY There are several different kinds of heat therapy, including:  Moist heat pack.  Warm water bath.  Hot water bottle.  Electric heating pad.  Heated gel pack.  Heated wrap.  Electric heating pad. Use the heat therapy method suggested by your health care provider. Follow your health care provider's instructions on when and how to use heat therapy. GENERAL HEAT THERAPY RECOMMENDATIONS  Do not sleep while using heat therapy. Only use heat therapy while you are awake.  Your skin may turn pink while using heat therapy. Do not use heat therapy if your skin turns red.  Do not use heat therapy if you have new pain.  High heat or long exposure to heat can cause  burns. Be careful when using heat therapy to avoid burning your skin.  Do not use heat therapy on areas of your skin that are already irritated, such as with a rash or sunburn. SEEK MEDICAL CARE IF:  You have blisters, redness, swelling, or numbness.  You have new pain.  Your pain is worse. MAKE SURE YOU:  Understand these instructions.  Will watch your condition.  Will get help right away if you are not doing well or get worse. Document Released: 08/28/2011 Document Revised: 10/20/2013 Document Reviewed: 07/29/2013 [facility] Patient Information 2015 [place], [place]. This information is not intended to replace advice given to you by your health care provider. Make sure you discuss any questions you have with your health care provider.  Shortness of Breath Shortness of breath means you have trouble breathing. It could also mean that you have a medical problem. You should get immediate medical care for shortness of breath. CAUSES   Not enough oxygen in the air such as with high altitudes or a smoke-filled room.  Certain lung diseases, infections, or problems.  Heart disease or conditions, such as angina or heart failure.  Low red blood cells (anemia).  Poor physical fitness, which can cause shortness of breath when you exercise.  Chest or back injuries or stiffness.  Being overweight.  Smoking.  Anxiety, which can make you feel like you are not getting enough air. DIAGNOSIS  Serious medical problems can often be found during your physical exam. Tests may also be done to determine why you are having shortness of breath. Tests may include:  Chest X-rays.  Lung function tests.  Blood tests.  An electrocardiogram (ECG).  An ambulatory electrocardiogram. An ambulatory ECG records your heartbeat patterns over a 24-hour period.  Exercise testing.  A transthoracic echocardiogram (TTE). During echocardiography, sound waves are used to evaluate how blood flows through your  heart.  A transesophageal echocardiogram (TEE).  Imaging scans. Your health care provider may not be able to find a cause for your shortness of breath after your exam. In this case, it is important to have a follow-up exam with your health care provider as directed.  TREATMENT  Treatment for shortness of breath depends on the cause of your symptoms and can vary greatly. HOME CARE INSTRUCTIONS   Do not smoke. Smoking is a common cause of shortness of breath. If you smoke, ask for help to quit.  Avoid being around chemicals or things that may bother your breathing, such as paint fumes and dust.  Rest as needed. Slowly resume your  usual activities.  If medicines were prescribed, take them as directed for the full length of time directed. This includes oxygen and any inhaled medicines.  Keep all follow-up appointments as directed by your health care provider. SEEK MEDICAL CARE IF:   Your condition does not improve in the time expected.  You have a hard time doing your normal activities even with rest.  You have any new symptoms. SEEK IMMEDIATE MEDICAL CARE IF:   Your shortness of breath gets worse.  You feel light-headed, faint, or develop a cough not controlled with medicines.  You start coughing up blood.  You have pain with breathing.  You have chest pain or pain in your arms, shoulders, or abdomen.  You have a fever.  You are unable to walk up stairs or exercise the way you normally do. MAKE SURE YOU:  Understand these instructions.  Will watch your condition.  Will get help right away if you are not doing well or get worse. Document Released: 02/28/2001 Document Revised: 06/10/2013 Document Reviewed: 08/21/2011 [facility] Patient Information 2015 [place], [place]. This information is not intended to replace advice given to you by your health care provider. Make sure you discuss any questions you have with your health care provider.  Smoking Cessation Quitting smoking  is important to your health and has many advantages. However, it is not always easy to quit since nicotine is a very addictive drug. Oftentimes, people try 3 times or more before being able to quit. This document explains the best ways for you to prepare to quit smoking. Quitting takes hard work and a lot of effort, but you can do it. ADVANTAGES OF QUITTING SMOKING  You will live longer, feel better, and live better.  Your body will feel the impact of quitting smoking almost immediately.  Within 20 minutes, blood pressure decreases. Your pulse returns to its normal level.  After 8 hours, carbon monoxide levels in the blood return to normal. Your oxygen level increases.  After 24 hours, the chance of having a heart attack starts to decrease. Your breath, hair, and body stop smelling like smoke.  After 48 hours, damaged nerve endings begin to recover. Your sense of taste and smell improve.  After 72 hours, the body is virtually free of nicotine. Your bronchial tubes relax and breathing becomes easier.  After 2 to 12 weeks, lungs can hold more air. Exercise becomes easier and circulation improves.  The risk of having a heart attack, stroke, cancer, or lung disease is greatly reduced.  After 1 year, the risk of coronary heart disease is cut in half.  After 5 years, the risk of stroke falls to the same as a nonsmoker.  After 10 years, the risk of lung cancer is cut in half and the risk of other cancers decreases significantly.  After 15 years, the risk of coronary heart disease drops, usually to the level of a nonsmoker.  If you are pregnant, quitting smoking will improve your chances of having a healthy baby.  The people you live with, especially any children, will be healthier.  You will have extra money to spend on things other than cigarettes. QUESTIONS TO THINK ABOUT BEFORE ATTEMPTING TO QUIT You may want to talk about your answers with your health care provider.  Why do you want  to quit?  If you tried to quit in the past, what helped and what did not?  What will be the most difficult situations for you after you quit? How will you  plan to handle them?  Who can help you through the tough times? Your family? Friends? A health care provider?  What pleasures do you get from smoking? What ways can you still get pleasure if you quit? Here are some questions to ask your health care provider:  How can you help me to be successful at quitting?  What medicine do you think would be best for me and how should I take it?  What should I do if I need more help?  What is smoking withdrawal like? How can I get information on withdrawal? GET READY  Set a quit date.  Change your environment by getting rid of all cigarettes, ashtrays, matches, and lighters in your home, car, or work. Do not let people smoke in your home.  Review your past attempts to quit. Think about what worked and what did not. GET SUPPORT AND ENCOURAGEMENT You have a better chance of being successful if you have help. You can get support in many ways.  Tell your family, friends, and coworkers that you are going to quit and need their support. Ask them not to smoke around you.  Get individual, group, or telephone counseling and support. Programs are available at [facility]. Call your local health department for information about programs in your area.  Spiritual beliefs and practices may help some smokers quit.  Download a "quit meter" on your computer to keep track of quit statistics, such as how long you have gone without smoking, cigarettes not smoked, and money saved.  Get a self-help book about quitting smoking and staying off tobacco. LEARN NEW SKILLS AND BEHAVIORS  Distract yourself from urges to smoke. Talk to someone, go for a walk, or occupy your time with a task.  Change your normal routine. Take a different route to work. Drink tea instead of coffee. Eat breakfast  in a different place.  Reduce your stress. Take a hot bath, exercise, or read a book.  Plan something enjoyable to do every day. Reward yourself for not smoking.  Explore interactive web-based programs that specialize in helping you quit. GET MEDICINE AND USE IT CORRECTLY Medicines can help you stop smoking and decrease the urge to smoke. Combining medicine with the above behavioral methods and support can greatly increase your chances of successfully quitting smoking.  Nicotine replacement therapy helps deliver nicotine to your body without the negative effects and risks of smoking. Nicotine replacement therapy includes nicotine gum, lozenges, inhalers, nasal sprays, and skin patches. Some may be available over-the-counter and others require a prescription.  Antidepressant medicine helps people abstain from smoking, but how this works is unknown. This medicine is available by prescription.  Nicotinic receptor partial agonist medicine simulates the effect of nicotine in your brain. This medicine is available by prescription. Ask your health care provider for advice about which medicines to use and how to use them based on your health history. Your health care provider will tell you what side effects to look out for if you choose to be on a medicine or therapy. Carefully read the information on the package. Do not use any other product containing nicotine while using a nicotine replacement product.  RELAPSE OR DIFFICULT SITUATIONS Most relapses occur within the first 3 months after quitting. Do not be discouraged if you start smoking again. Remember, most people try several times before finally quitting. You may have symptoms of withdrawal because your body is used to nicotine. You may crave cigarettes, be irritable, feel  very hungry, cough often, get headaches, or have difficulty concentrating. The withdrawal symptoms are only temporary. They are strongest when you first quit, but they will go away  within 10-14 days. To reduce the chances of relapse, try to:  Avoid drinking alcohol. Drinking lowers your chances of successfully quitting.  Reduce the amount of caffeine you consume. Once you quit smoking, the amount of caffeine in your body increases and can give you symptoms, such as a rapid heartbeat, sweating, and anxiety.  Avoid smokers because they can make you want to smoke.  Do not let weight gain distract you. Many smokers will gain weight when they quit, usually less than 10 pounds. Eat a healthy diet and stay active. You can always lose the weight gained after you quit.  Find ways to improve your mood other than smoking. FOR MORE INFORMATION  www.smokefree.gov  Document Released: 05/30/2001 Document Revised: 10/20/2013 Document Reviewed: 09/14/2011 [facility] Patient Information 2015 [place], [place]. This information is not intended to replace advice given to you by your health care provider. Make sure you discuss any questions you have with your health care provider.

## 2021-08-27 ENCOUNTER — Emergency Department (HOSPITAL_COMMUNITY): Payer: Medicaid - Out of State

## 2021-08-27 ENCOUNTER — Other Ambulatory Visit: Payer: Self-pay

## 2021-08-27 ENCOUNTER — Encounter (HOSPITAL_COMMUNITY): Payer: Self-pay

## 2021-08-27 ENCOUNTER — Observation Stay (HOSPITAL_COMMUNITY)
Admission: EM | Admit: 2021-08-27 | Discharge: 2021-08-29 | Disposition: A | Discharge status: Home / Self Care | Payer: Medicaid - Out of State | Attending: Internal Medicine | Admitting: Internal Medicine

## 2021-08-27 DIAGNOSIS — R64 Cachexia: Secondary | ICD-10-CM | POA: Insufficient documentation

## 2021-08-27 DIAGNOSIS — J45909 Unspecified asthma, uncomplicated: Secondary | ICD-10-CM | POA: Insufficient documentation

## 2021-08-27 DIAGNOSIS — Z79899 Other long term (current) drug therapy: Secondary | ICD-10-CM | POA: Insufficient documentation

## 2021-08-27 DIAGNOSIS — I509 Heart failure, unspecified: Secondary | ICD-10-CM | POA: Diagnosis not present

## 2021-08-27 DIAGNOSIS — Z23 Encounter for immunization: Secondary | ICD-10-CM | POA: Insufficient documentation

## 2021-08-27 DIAGNOSIS — J189 Pneumonia, unspecified organism: Secondary | ICD-10-CM | POA: Diagnosis not present

## 2021-08-27 DIAGNOSIS — Z20822 Contact with and (suspected) exposure to covid-19: Secondary | ICD-10-CM | POA: Diagnosis not present

## 2021-08-27 DIAGNOSIS — E46 Unspecified protein-calorie malnutrition: Secondary | ICD-10-CM | POA: Diagnosis not present

## 2021-08-27 DIAGNOSIS — I2510 Atherosclerotic heart disease of native coronary artery without angina pectoris: Secondary | ICD-10-CM | POA: Insufficient documentation

## 2021-08-27 DIAGNOSIS — R0602 Shortness of breath: Secondary | ICD-10-CM | POA: Diagnosis present

## 2021-08-27 DIAGNOSIS — J441 Chronic obstructive pulmonary disease with (acute) exacerbation: Principal | ICD-10-CM | POA: Diagnosis present

## 2021-08-27 DIAGNOSIS — I110 Hypertensive heart disease with heart failure: Secondary | ICD-10-CM | POA: Insufficient documentation

## 2021-08-27 LAB — RESP PANEL BY RT-PCR (FLU A&B, COVID) ARPGX2
Influenza A by PCR: NEGATIVE
Influenza B by PCR: NEGATIVE
SARS Coronavirus 2 by RT PCR: NEGATIVE

## 2021-08-27 LAB — LIPID PANEL
Cholesterol: 148 mg/dL (ref 0–200)
HDL: 88 mg/dL (ref >40)
LDL Cholesterol: 52 mg/dL (ref 0–99)
Total CHOL/HDL Ratio: 1.7 RATIO
Triglycerides: 38 mg/dL (ref <150)
VLDL: 8 mg/dL (ref 0–40)

## 2021-08-27 LAB — HIV ANTIBODY (ROUTINE TESTING W REFLEX): HIV Screen 4th Generation wRfx: NONREACTIVE

## 2021-08-27 LAB — CBC WITH DIFFERENTIAL/PLATELET
Abs Immature Granulocytes: 0.01 10*3/uL (ref 0.00–0.07)
Basophils Absolute: 0.1 10*3/uL (ref 0.0–0.1)
Basophils Relative: 1 %
Eosinophils Absolute: 0.1 10*3/uL (ref 0.0–0.5)
Eosinophils Relative: 3 %
HCT: 41.7 % (ref 39.0–52.0)
Hemoglobin: 13.5 g/dL (ref 13.0–17.0)
Immature Granulocytes: 0 %
Lymphocytes Relative: 24 %
Lymphs Abs: 1.2 10*3/uL (ref 0.7–4.0)
MCH: 33.3 pg (ref 26.0–34.0)
MCHC: 32.4 g/dL (ref 30.0–36.0)
MCV: 102.7 fL — ABNORMAL HIGH (ref 80.0–100.0)
Monocytes Absolute: 0.6 10*3/uL (ref 0.1–1.0)
Monocytes Relative: 13 %
Neutro Abs: 2.8 10*3/uL (ref 1.7–7.7)
Neutrophils Relative %: 59 %
Platelets: 258 10*3/uL (ref 150–400)
RBC: 4.06 MIL/uL — ABNORMAL LOW (ref 4.22–5.81)
RDW: 13.7 % (ref 11.5–15.5)
WBC: 4.8 10*3/uL (ref 4.0–10.5)
nRBC: 0 % (ref 0.0–0.2)

## 2021-08-27 LAB — BASIC METABOLIC PANEL
Anion gap: 13 (ref 5–15)
BUN: 8 mg/dL (ref 6–20)
CO2: 21 mmol/L — ABNORMAL LOW (ref 22–32)
Calcium: 9.6 mg/dL (ref 8.9–10.3)
Chloride: 99 mmol/L (ref 98–111)
Creatinine, Ser: 1 mg/dL (ref 0.61–1.24)
GFR, Estimated: >60 mL/min (ref >60)
Glucose, Bld: 92 mg/dL (ref 70–99)
Potassium: 4.1 mmol/L (ref 3.5–5.1)
Sodium: 133 mmol/L — ABNORMAL LOW (ref 135–145)

## 2021-08-27 LAB — LACTIC ACID, PLASMA
Lactic Acid, Venous: 2.8 mmol/L (ref 0.5–1.9)
Lactic Acid, Venous: 2.8 mmol/L (ref 0.5–1.9)

## 2021-08-27 LAB — HEPATIC FUNCTION PANEL
ALT: 14 U/L (ref 0–44)
AST: 26 U/L (ref 15–41)
Albumin: 3.5 g/dL (ref 3.5–5.0)
Alkaline Phosphatase: 60 U/L (ref 38–126)
Bilirubin, Direct: 0.1 mg/dL (ref 0.0–0.2)
Indirect Bilirubin: 0.2 mg/dL — ABNORMAL LOW (ref 0.3–0.9)
Total Bilirubin: 0.3 mg/dL (ref 0.3–1.2)
Total Protein: 6.6 g/dL (ref 6.5–8.1)

## 2021-08-27 LAB — FOLATE: Folate: 20.7 ng/mL (ref >5.9)

## 2021-08-27 LAB — BRAIN NATRIURETIC PEPTIDE: B Natriuretic Peptide: 102.9 pg/mL — ABNORMAL HIGH (ref 0.0–100.0)

## 2021-08-27 LAB — VITAMIN B12: Vitamin B-12: 271 pg/mL (ref 180–914)

## 2021-08-27 MED ORDER — PNEUMOCOCCAL 20-VAL CONJ VACC 0.5 ML IM SUSY
0.5000 mL | PREFILLED_SYRINGE | INTRAMUSCULAR | Status: AC
Start: 2021-08-28 — End: 2021-08-28
  Administered 2021-08-28: 0.5 mL via INTRAMUSCULAR
  Filled 2021-08-27: qty 0.5

## 2021-08-27 MED ORDER — ADULT MULTIVITAMIN W/MINERALS CH
1.0000 | ORAL_TABLET | Freq: Every day | ORAL | Status: DC
  Administered 2021-08-27 – 2021-08-29 (×3): 1 via ORAL
  Filled 2021-08-27 (×3): qty 1

## 2021-08-27 MED ORDER — GUAIFENESIN-DM 100-10 MG/5ML PO SYRP
5.0000 mL | ORAL_SOLUTION | ORAL | Status: DC | PRN
  Administered 2021-08-27: 5 mL via ORAL
  Filled 2021-08-27: qty 5

## 2021-08-27 MED ORDER — PREDNISONE 20 MG PO TABS
40.0000 mg | ORAL_TABLET | Freq: Every day | ORAL | Status: DC
  Administered 2021-08-28 – 2021-08-29 (×2): 40 mg via ORAL
  Filled 2021-08-27 (×2): qty 2

## 2021-08-27 MED ORDER — ENOXAPARIN SODIUM 40 MG/0.4ML IJ SOSY
40.0000 mg | PREFILLED_SYRINGE | INTRAMUSCULAR | Status: DC
  Administered 2021-08-27 – 2021-08-28 (×2): 40 mg via SUBCUTANEOUS
  Filled 2021-08-27 (×2): qty 0.4

## 2021-08-27 MED ORDER — IOHEXOL 350 MG/ML SOLN
75.0000 mL | Freq: Once | INTRAVENOUS | Status: AC | PRN
  Administered 2021-08-27: 75 mL via INTRAVENOUS

## 2021-08-27 MED ORDER — ACETAMINOPHEN 325 MG PO TABS
650.0000 mg | ORAL_TABLET | Freq: Four times a day (QID) | ORAL | Status: DC | PRN
  Administered 2021-08-27: 650 mg via ORAL
  Filled 2021-08-27: qty 2

## 2021-08-27 MED ORDER — ALBUTEROL SULFATE (2.5 MG/3ML) 0.083% IN NEBU
10.0000 mg | INHALATION_SOLUTION | RESPIRATORY_TRACT | Status: DC
  Administered 2021-08-27: 10 mg via RESPIRATORY_TRACT
  Filled 2021-08-27: qty 12

## 2021-08-27 MED ORDER — SODIUM CHLORIDE 0.9 % IV BOLUS
1000.0000 mL | Freq: Once | INTRAVENOUS | Status: AC
  Administered 2021-08-27: 1000 mL via INTRAVENOUS

## 2021-08-27 MED ORDER — SENNOSIDES-DOCUSATE SODIUM 8.6-50 MG PO TABS: 1.0000 | ORAL_TABLET | Freq: Every evening | ORAL | Status: DC | PRN

## 2021-08-27 MED ORDER — SODIUM CHLORIDE 0.9 % IV SOLN
500.0000 mg | INTRAVENOUS | Status: DC
  Administered 2021-08-27: 500 mg via INTRAVENOUS
  Filled 2021-08-27: qty 5

## 2021-08-27 MED ORDER — IPRATROPIUM-ALBUTEROL 0.5-2.5 (3) MG/3ML IN SOLN
3.0000 mL | Freq: Once | RESPIRATORY_TRACT | Status: AC
  Administered 2021-08-27: 3 mL via RESPIRATORY_TRACT
  Filled 2021-08-27: qty 3

## 2021-08-27 MED ORDER — ACETAMINOPHEN 650 MG RE SUPP: 650.0000 mg | Freq: Four times a day (QID) | RECTAL | Status: DC | PRN

## 2021-08-27 MED ORDER — ONDANSETRON HCL 4 MG PO TABS: 4.0000 mg | ORAL_TABLET | Freq: Four times a day (QID) | ORAL | Status: DC | PRN

## 2021-08-27 MED ORDER — SODIUM CHLORIDE 0.9 % IV SOLN
3.0000 g | Freq: Four times a day (QID) | INTRAVENOUS | Status: DC
  Administered 2021-08-27 – 2021-08-29 (×7): 3 g via INTRAVENOUS
  Filled 2021-08-27 (×9): qty 8

## 2021-08-27 MED ORDER — SODIUM CHLORIDE 0.9 % IV SOLN
1.5000 g | Freq: Four times a day (QID) | INTRAVENOUS | Status: DC
  Filled 2021-08-27 (×3): qty 4

## 2021-08-27 MED ORDER — ONDANSETRON HCL 4 MG/2ML IJ SOLN: 4.0000 mg | Freq: Four times a day (QID) | INTRAMUSCULAR | Status: DC | PRN

## 2021-08-27 MED ORDER — METHYLPREDNISOLONE SODIUM SUCC 125 MG IJ SOLR
125.0000 mg | Freq: Once | INTRAMUSCULAR | Status: AC
  Administered 2021-08-27: 125 mg via INTRAVENOUS
  Filled 2021-08-27: qty 2

## 2021-08-27 MED ORDER — SODIUM CHLORIDE 0.9 % IV SOLN
1.0000 g | Freq: Once | INTRAVENOUS | Status: AC
  Administered 2021-08-27: 1 g via INTRAVENOUS
  Filled 2021-08-27: qty 10

## 2021-08-27 MED ORDER — ATORVASTATIN CALCIUM 10 MG PO TABS
20.0000 mg | ORAL_TABLET | Freq: Every day | ORAL | Status: DC
  Administered 2021-08-27 – 2021-08-29 (×3): 20 mg via ORAL
  Filled 2021-08-27 (×3): qty 2

## 2021-08-27 MED ORDER — IPRATROPIUM-ALBUTEROL 0.5-2.5 (3) MG/3ML IN SOLN
3.0000 mL | Freq: Three times a day (TID) | RESPIRATORY_TRACT | Status: DC
  Administered 2021-08-27 – 2021-08-29 (×7): 3 mL via RESPIRATORY_TRACT
  Filled 2021-08-27 (×7): qty 3

## 2021-08-27 NOTE — ED Triage Notes (Signed)
Patient here POV due to increased work of breathing. Patient is unable to walk far without getting SOB and his legs giving out for about 2 months now. Has had a dry cough for about 1 month without anything coming up. ? ?Hx of COPD ?

## 2021-08-27 NOTE — ED Provider Notes (Signed)
[facility] EMERGENCY DEPARTMENT Provider Note   CSN: [id number] Arrival date & time: 08/27/21  0801     History  No chief complaint on file.   Jaime Cline is a 61 y.o. male.  Pt is a 61 yo male with a hx of COPD, htn, alcohol abuse and chronic pain.  He has recently moved back to [place] from [place].  He said he was hospitalized in [facility] in [place], [place] and his weight got down to 88 lbs.  After release, he came back here and weight is up slightly, but he is sob and has a continued cough.  Pt denies f/c.  He feels very weak.        Home Medications Prior to Admission medications   Medication Sig Start Date End Date Taking? Authorizing Provider  albuterol (PROVENTIL HFA;VENTOLIN HFA) 108 (90 BASE) MCG/ACT inhaler Inhale 2 puffs into the lungs every 4 (four) hours as needed for wheezing or shortness of breath. 03/27/14   Marisa Severin, MD  Fluticasone-Salmeterol (ADVAIR) 100-50 MCG/DOSE AEPB Inhale 1 puff into the lungs 2 (two) times daily. 03/27/14   Marisa Severin, MD  methocarbamol (ROBAXIN-750) 750 MG tablet Take 1 tablet (750 mg total) by mouth every 6 (six) hours as needed for muscle spasms. 03/27/14   Marisa Severin, MD  traMADol (ULTRAM) 50 MG tablet Take 1 tablet (50 mg total) by mouth every 6 (six) hours as needed. 03/27/14   Marisa Severin, MD  albuterol (PROVENTIL,VENTOLIN) 90 MCG/ACT inhaler Inhale 2 puffs into the lungs every 6 (six) hours as needed. Shortness of breath and wheezing   03/22/12  [provider]      Allergies    Other    Review of Systems   Review of Systems  Respiratory:  Positive for cough, shortness of breath and wheezing.   All other systems reviewed and are negative.  Physical Exam Updated Vital Signs BP 116/77    Pulse 78    Temp 98.2 F (36.8 C) (Oral)    Resp 20    SpO2 100%  Physical Exam Vitals and nursing note reviewed.  Constitutional:      Appearance: He is cachectic.  HENT:     Head:  Normocephalic and atraumatic.     Right Ear: External ear normal.     Left Ear: External ear normal.     Nose: Nose normal.     Mouth/Throat:     Mouth: Mucous membranes are dry.  Eyes:     Extraocular Movements: Extraocular movements intact.     Conjunctiva/sclera: Conjunctivae normal.     Pupils: Pupils are equal, round, and reactive to light.  Cardiovascular:     Rate and Rhythm: Normal rate and regular rhythm.     Pulses: Normal pulses.     Heart sounds: Normal heart sounds.  Pulmonary:     Effort: Tachypnea and accessory muscle usage present.     Breath sounds: Wheezing present.  Abdominal:     General: Abdomen is flat. Bowel sounds are normal.     Palpations: Abdomen is soft.  Musculoskeletal:        General: Normal range of motion.     Cervical back: Normal range of motion and neck supple.  Skin:    General: Skin is warm.     Capillary Refill: Capillary refill takes less than 2 seconds.  Neurological:     General: No focal deficit present.     Mental Status: He is alert and  oriented to person, place, and time.  Psychiatric:        Mood and Affect: Mood normal.        Behavior: Behavior normal.    ED Results / Procedures / Treatments   Labs (all labs ordered are listed, but only abnormal results are displayed) Labs Reviewed  BASIC METABOLIC PANEL - Abnormal; Notable for the following components:      Result Value   Sodium 133 (*)    CO2 21 (*)    All other components within normal limits  CBC WITH DIFFERENTIAL/PLATELET - Abnormal; Notable for the following components:   RBC 4.06 (*)    MCV 102.7 (*)    All other components within normal limits  BRAIN NATRIURETIC PEPTIDE - Abnormal; Notable for the following components:   B Natriuretic Peptide 102.9 (*)    All other components within normal limits  RESP PANEL BY RT-PCR (FLU A&B, COVID) ARPGX2  CULTURE, BLOOD (ROUTINE X 2)  CULTURE, BLOOD (ROUTINE X 2)  LACTIC ACID, PLASMA  LACTIC ACID, PLASMA    EKG EKG  Interpretation  Date/Time:  Saturday August 27 2021 08:24:10 EST Ventricular Rate:  95 PR Interval:  124 QRS Duration: 83 QT Interval:  353 QTC Calculation: 444 R Axis:   73 Text Interpretation: Sinus rhythm Right atrial enlargement RSR' in V1 or V2, probably normal variant Abnrm T, consider ischemia, anterolateral lds Artifact in lead(s) II III aVF V4 V5 V6 No significant change since last tracing Confirmed by Jacalyn Lefevre [phone]) on 08/27/2021 8:39:44 AM  Radiology CT Angio Chest PE W and/or Wo Contrast  Result Date: 08/27/2021 CLINICAL DATA:  61 year old male with history of shortness of breath. Evaluate for pulmonary embolism. EXAM: CT ANGIOGRAPHY CHEST WITH CONTRAST TECHNIQUE: Multidetector CT imaging of the chest was performed using the standard protocol during bolus administration of intravenous contrast. Multiplanar CT image reconstructions and MIPs were obtained to evaluate the vascular anatomy. RADIATION DOSE REDUCTION: This exam was performed according to the departmental dose-optimization program which includes automated exposure control, adjustment of the mA and/or kV according to patient size and/or use of iterative reconstruction technique. CONTRAST:  97mL OMNIPAQUE IOHEXOL 350 MG/ML SOLN COMPARISON:  No priors. FINDINGS: Cardiovascular: No filling defects within the pulmonary arterial tree to suggest pulmonary embolism. Heart size is normal. There is no significant pericardial fluid, thickening or pericardial calcification. There is aortic atherosclerosis, as well as atherosclerosis of the great vessels of the mediastinum and the coronary arteries, including calcified atherosclerotic plaque in the left anterior descending and left circumflex coronary arteries. Mediastinum/Nodes: Mediastinal or no pathologically enlarged hilar lymph nodes. Esophagus is unremarkable in appearance. No axillary lymphadenopathy. Lungs/Pleura: Some peripheral predominant airspace consolidation is noted in  the right lower lobe. Lungs are otherwise clear. No pleural effusions. No pneumothorax. No definite suspicious appearing pulmonary nodules or masses are noted. Diffuse bronchial wall thickening with moderate to severe centrilobular and paraseptal emphysema. Upper Abdomen: Aortic atherosclerosis. Musculoskeletal: There are no aggressive appearing lytic or blastic lesions noted in the visualized portions of the skeleton. Review of the MIP images confirms the above findings. IMPRESSION: 1. No evidence of pulmonary embolism. 2. Right lower lobe pneumonia. 3. Mild diffuse bronchial wall thickening with moderate to severe centrilobular and paraseptal emphysema; imaging findings suggestive of underlying COPD. 4. Aortic atherosclerosis, in addition to 2 vessel coronary artery disease. Please note that although the presence of coronary artery calcium documents the presence of coronary artery disease, the severity of this disease and any potential stenosis  cannot be assessed on this non-gated CT examination. Assessment for potential risk factor modification, dietary therapy or pharmacologic therapy may be warranted, if clinically indicated. Aortic Atherosclerosis (ICD10-I70.0) and Emphysema (ICD10-J43.9). Electronically Signed   By: Trudie Reed M.D.   On: 08/27/2021 12:21   DG Chest Port 1 View  Result Date: 08/27/2021 CLINICAL DATA:  Shortness of breath. EXAM: PORTABLE CHEST 1 VIEW COMPARISON:  12/26/2013 and prior radiographs. FINDINGS: The cardiomediastinal silhouette is unremarkable. Increased lung volumes are identified. There is focal airspace opacity at the lateral RIGHT lung base. There is no evidence of pleural effusion, mass, pneumothorax or acute bony abnormality. IMPRESSION: RIGHT basilar airspace opacity, question pneumonia/aspiration. Radiographic follow-up to resolution is recommended. Electronically Signed   By: Harmon Pier M.D.   On: 08/27/2021 09:45    Procedures Procedures    Medications  Ordered in ED Medications  albuterol (PROVENTIL) (2.5 MG/3ML) 0.083% nebulizer solution 10 mg (10 mg Nebulization New Bag/Given 08/27/21 1045)  azithromycin (ZITHROMAX) 500 mg in sodium chloride 0.9 % 250 mL IVPB (0 mg Intravenous Stopped 08/27/21 1235)  ipratropium-albuterol (DUONEB) 0.5-2.5 (3) MG/3ML nebulizer solution 3 mL (has no administration in time range)  ipratropium-albuterol (DUONEB) 0.5-2.5 (3) MG/3ML nebulizer solution 3 mL (3 mLs Nebulization Given 08/27/21 0854)  methylPREDNISolone sodium succinate (SOLU-MEDROL) 125 mg/2 mL injection 125 mg (125 mg Intravenous Given 08/27/21 0851)  sodium chloride 0.9 % bolus 1,000 mL (1,000 mLs Intravenous New Bag/Given 08/27/21 1056)  cefTRIAXone (ROCEPHIN) 1 g in sodium chloride 0.9 % 100 mL IVPB (0 g Intravenous Stopped 08/27/21 1141)  iohexol (OMNIPAQUE) 350 MG/ML injection 75 mL (75 mLs Intravenous Contrast Given 08/27/21 1158)    ED Course/ Medical Decision Making/ A&P                           Medical Decision Making Amount and/or Complexity of Data Reviewed Labs: ordered. Radiology: ordered.  Risk Prescription drug management. Decision regarding hospitalization.   This patient presents to the ED for concern of sob, this involves an extensive number of treatment options, and is a complaint that carries with it a high risk of complications and morbidity.  The differential diagnosis includes copd exac, pna, uri, covid/flu, cardiac   Co morbidities that complicate the patient evaluation  copd   Additional history obtained:  Additional history obtained from epic chart review External records from outside source obtained and reviewed including trying to get medical records from [facility] Tests:  I Ordered, and personally interpreted labs.  The pertinent results include:  covid/flu neg. Cbc nl, bmp nl, bnp 103   Imaging Studies ordered:  I ordered imaging studies including cxr  I independently visualized and  interpreted imaging which showed    IMPRESSION:  RIGHT basilar airspace opacity, question pneumonia/aspiration.  Radiographic follow-up to resolution is recommended.  CT chest:   IMPRESSION:  1. No evidence of pulmonary embolism.  2. Right lower lobe pneumonia.  3. Mild diffuse bronchial wall thickening with moderate to severe  centrilobular and paraseptal emphysema; imaging findings suggestive  of underlying COPD.  4. Aortic atherosclerosis, in addition to 2 vessel coronary artery  disease. Please note that although the presence of coronary artery  calcium documents the presence of coronary artery disease, the  severity of this disease and any potential stenosis cannot be  assessed on this non-gated CT examination. Assessment for potential  risk factor modification, dietary therapy or pharmacologic therapy  may be warranted, if  clinically indicated.     Aortic Atherosclerosis (ICD10-I70.0) and Emphysema (ICD10-J43.9).   I agree with the radiologist interpretation   Cardiac Monitoring:  The patient was maintained on a cardiac monitor.  I personally viewed and interpreted the cardiac monitored which showed an underlying rhythm of: nsr   Medicines ordered and prescription drug management:  I ordered medication including nebs, solumedrol  for copd exac  Reevaluation of the patient after these medicines showed that the patient improved I have reviewed the patients home medicines and have made adjustments as needed   Test Considered:  Ct chest   Critical Interventions:  Nebs, steroids, abx   Consultations Obtained:  I requested consultation with the internal medicine teaching service,  and discussed lab and imaging findings as well as pertinent plan - they will admit.   Problem List / ED Course:  COPD exacerbation:  SOB has improved after nebs and steroids, but he is still sob.  O2 sat ok with ambulation, but his work of breathing increased RLL pna:   rocephin/zithromax ordered. Recent admission:  our secretary has tried various hospitals to get records.  They all report no recent admission.   Reevaluation:  After the interventions noted above, I reevaluated the patient and found that they have :improved   Social Determinants of Health:  No pcp, lives at home by himself.     Dispostion:  After consideration of the diagnostic results and the patients response to treatment, I feel that the patent would benefit from admission.          Final Clinical Impression(s) / ED Diagnoses Final diagnoses:  COPD exacerbation (HCC)  Community acquired pneumonia of right lower lobe of lung  Malnutrition, unspecified type (HCC)    [facility] Orders     None         Jacalyn LefevreHaviland, Roney Youtz, MD 08/27/21 1259

## 2021-08-27 NOTE — Progress Notes (Signed)
Pharmacy Antibiotic Note ? ?Jaime Cline is a 61 y.o. male admitted on 08/27/2021 with  aspiration pneumonia .  Pharmacy has been consulted for unasyn dosing. ? ?Patient with a history of COPD, heart failure, HTN, and gastritis . Patient presenting with SOB / weakness. ? ?Plan: ?Unasyn 3g q6h ?Trend WBC, Fever, Renal function, & Clinical course ?F/u cultures, clinical course, WBC, fever ?De-escalate when able ? ?  ? ?Temp (24hrs), Avg:98 ?F (36.7 ?C), Min:97.7 ?F (36.5 ?C), Max:98.2 ?F (36.8 ?C) ? ?Recent Labs  ?Lab 08/27/21 ?0801 08/27/21 ?1330  ?WBC 4.8  --   ?CREATININE 1.00  --   ?LATICACIDVEN  --  2.8*  ?  ?CrCl cannot be calculated (Unknown ideal weight.).   ? ?Allergies  ?Allergen Reactions  ? Other Other (See Comments)  ?  BP med from [facility] about 3 years ago, caused some seizures, due to getting wrong BP med at that time.  Unknown name of med  ? ? ?Antimicrobials this admission: ?Rocephin / azith x 1 in ED 3/11 ?Unasyn 3/11 >>  ? ?Microbiology results: ?Pending ? ?Thank you for allowing pharmacy to be a part of this patient?s care. ? ?Delmar Landau, PharmD, BCPS ?08/27/2021 4:10 PM ?ED Clinical Pharmacist -  [phone] ?  ?

## 2021-08-27 NOTE — H&P (Signed)
Date: 08/27/2021               Patient Name:  Jaime Cline MRN: [id number]  DOB: [birth year] Age / Sex: 61 y.o., male   PCP: Default, Provider, MD         Medical Service: Internal Medicine Teaching Service         Attending Physician: Dr. Reymundo Poll, MD    First Contact: Dr. August Saucer Pager: [phone]  Second Contact: Dr. Kirke Corin Pager: [phone]       After Hours (After 5p/  First Contact Pager: [phone]  weekends / holidays): Second Contact Pager: [phone]   Chief Complaint: weakness, shortness of breath  History of Present Illness: Jaime Cline is a 61 y.o. male with PMH of COPD, heart failure, HTN, and gastritis who presents with weakness and shortness of breath. He says that the shortness of breath has worsened particularly over the last two months or so and the weakness has been bothersome in the last week. He was previously in [place] where he acknowledges that there are different plants and allergens which could have caused some new breathing issues but no recent illnesses. He endorses onset of cough with green sputum over the last month, with now white colored sputum. At this time he says that if he tried to walk from his bed to the door he would get very short of breath, and that he has leg weakness but says that it is associated with the shortness of breath. He has been working on increasing the distance he can walk without stopping and makes it almost one block but is limited by his breathing. He moves more slowly than others around him and could not make it up a flight of stairs without difficulty. No orthopnea.  He denies fever or chills but has full-body sweating after coughing fits. He has had some wheezing recently as well. He has had a 10-lb weight gain in the last month or so, from 88 points to 98 pounds. He endorses normal appetite and food intake and denies any malabsorption disorders. He says he has never weighed more than 120 pounds in his life.  ED Course: CBC  without leukocytosis, MCV elevated at 102.7. BMP with hyponatremia 133, decreased bicarb 21, elevated BNP 102.5, COVID-19 and flu negative, lactic acidosis of 2.8. Chest x-ray remarkable for R basilar airspace opacity. CTA chest remarkable for RLL pneumonia, mild diffuse bronchial wall thickening with moderate to severe centrilobular and paraseptal emphysema; imaging findings suggestive of underlying COPD, aortic atherosclerosis, in addition to 2 vessel coronary artery disease.   Meds: Reports medication adherence however he has been having to pay out of pocket for medications recently so has not had all of them.   Current Meds  Medication Sig   albuterol (PROVENTIL HFA;VENTOLIN HFA) 108 (90 BASE) MCG/ACT inhaler Inhale 2 puffs into the lungs every 4 (four) hours as needed for wheezing or shortness of breath.   atorvastatin (LIPITOR) 20 MG tablet Take 20 mg by mouth daily.   famotidine (PEPCID) 20 MG tablet Take 20 mg by mouth 2 (two) times daily.   Fluticasone-Salmeterol (ADVAIR) 100-50 MCG/DOSE AEPB Inhale 1 puff into the lungs 2 (two) times daily.   lisinopril (ZESTRIL) 20 MG tablet Take 20 mg by mouth daily.   sertraline (ZOLOFT) 50 MG tablet Take 50 mg by mouth daily.    Allergies: Allergies as of 08/27/2021 - Review Complete 08/27/2021  Allergen Reaction Noted   Other Other (See Comments) 09/06/2013  Past Medical History:  Diagnosis Date   Alcohol abuse    Asthma    Bronchitis    Chronic back pain    Chronic knee pain    Hypertension     Family History: Extensive history of cancer (unknown kind).  Social History:  Work history: Disabled [place] Activity level: Independent for ADLs/IADLs but is limited at times by dyspnea PCP: None Substance use: Denies all substance use. Endorses history of 1/2 ppd for 20-30 years.  Review of Systems: A complete ROS was negative except as per HPI.   Physical Exam: Blood pressure 129/86, pulse 85, temperature  98.2 F (36.8 C), temperature source Oral, resp. rate 18, SpO2 95 %. Constitutional:Extremely thin and cachectic gentleman resting comfortably in bed. No acute distress. HENT:Facial features are sunken. Facial bones are prominent. Cardio:Regular rate and rhythm. No murmurs, rubs, gallops. Pulm:Mild diffuse wheezing. Normal work of breathing on room air. Abdomen:Soft, nontender, nondistended. WUJ:[id number]:Decreased bulk and tone.  Skin:Warm and dry. Neuro: Mental Status: Patient is awake, alert, oriented x3 No signs of aphasia or neglect Cranial Nerves: III,IV, VI: EOMI without ptosis or diploplia.  V: Facial sensation is symmetric to light touch and temperature. VII: Facial movement is symmetric.  VIII: Hearing is intact to voice X: Uvula elevates symmetrically XI: Shoulder shrug is symmetric. XII: Tongue is midline without atrophy or fasciculations.  Motor: Normal effort thorughout, at least 5/5 bilateral UE, 5/5 bilateral LE Sensory: Sensation is grossly intact bilateral UE & LE Cerebellar: Finger-Nose and Heel-Shin intact bilaterally Psych:Normal mood and affect.  EKG: personally reviewed my interpretation is sinus rhythm, right atrial enlargement, RSR' in V1 or V2 (probably normal variant), abnormal T wave in anterolateral leads.  CXR: personally reviewed my interpretation is R basilar airspace opacity, question pneumonia/aspiration.   CTA Chest: 1. No evidence of pulmonary embolism. 2. Right lower lobe pneumonia. 3. Mild diffuse bronchial wall thickening with moderate to severe centrilobular and paraseptal emphysema; imaging findings suggestive of underlying COPD. 4. Aortic atherosclerosis, in addition to 2 vessel coronary artery disease. Please note that although the presence of coronary artery calcium documents the presence of coronary artery disease, the severity of this disease and any potential stenosis cannot be assessed on this non-gated CT examination. Assessment for potential  risk factor modification, dietary therapy or pharmacologic therapy may be warranted, if clinically indicated.  Assessment & Plan by Problem: Principal Problem:   COPD exacerbation (HCC)  COPD exacerbation RLL pneumonia Patient reports lack of insurance coverage now that he is back in [place] from [place]. He admitted that this has limited his ability to take all of his medications due to cost, but says he last had breathing treatments before coming to the hospital. He received nebulizer treatment and steroids in the ED with improvement of shortness of breath. Chest x-ray concerning for RLL pneumonia. CTA chest showing mild diffuse bronchial wall thickening with moderate to severe centrilobular and paraseptal emphysema. Lactic acidosis at 2.8. Afebrile, hemodynamically stable. Chart review shows that he has had several presentations to the ED in the past for similar complaints and in those times he had run out of his medications. He states that he has had PFTs done but does not recall what the results were. No new oxygen requirement. Patient's presenting symptoms likely multifactorial including COPD exacerbation, medication nonadherence, RLL pneumonia--CAP vs. Aspiration. -S/p ceftriaxone 1 g IV x1, azithromycin 500 mg IV. -Start ampicillin-sulbactam 1 g IV q6h -Sputum sample -Proventil, DuoNeb breathing treatments -Prednisone 40 mg  PO daily -F/u blood cultures -Trend CBC  Cachexia Patient is cachectic on physical exam. He explains that he has never weighed more than 120 pounds in his life, and that over the last month his weight has increased from 98 pounds from 88 pounds. He says that he eats normally and describes a diet full with animal proteins. On chart review he has a history of alcohol abuse and has had blood ethanol levels as high as 436 in the past. He denies current alcohol or substance abuse as well as diagnosed malabsorptive disorders. Given well-documented history of alcohol abuse I worry that  this could still be playing a role. I am unable to see formal work-up for COPD (may have been done in [place], where he just moved back from) but if it is on the severe side he could be losing calories to increased work of breathing. He is not on home oxygen and not requiring rest or ambulatory supplementation at this time so I feel this is less likely to be contributing. Admission labs also notable for macrocytic anemia and chart review reveals CT abdomen/pelvis in 2007 showing hepatomegaly with small ascites. -Check vitamin B12, folate, hepatic function panel -Consider RUQ ultrasound -RD consult -PT consult  History of CHF Patient appears clinically hypovolemic. BNP mildly elevated to 102.5. No crackles appreciated on physical exam, no orthopnea, no new oxygen requirement. -Echocardiogram ordered  CAD No recent lipid panel that I am able to see but CTA chest showed aortic atherosclerosis and two vessel coronary disease. He is on atorvastatin 20 mg daily.  -F/u lipid panel and calculate ASCVD -Continue home atorvastatin 20 mg daily  Dispo: Admit patient to Observation with expected length of stay less than 2 midnights.  Signed: Champ Mungo, DO 08/27/2021, 3:56 PM  Pager: [phone] After 5pm on weekdays and 1pm on weekends: On Call pager: [phone]

## 2021-08-27 NOTE — Hospital Course (Addendum)
COPD exacerbation ?RLL pneumonia ?Patient presented with weakness and shortness of breath that had gradually worsened over the previous two months with worsening of the weakness in the previous week. He had white sputum with productive cough. At admission labs remarkable for mildly elevated BNP of 102.5, lactic acidosis of 2.8,. Chest x-ray remarkable for R basilar airspace opacity. CTA chest remarkable for RLL pneumonia, mild diffuse bronchial wall thickening with moderate to severe centrilobular and paraseptal emphysema; imaging findings suggestive of underlying COPD, aortic atherosclerosis, in addition to 2 vessel coronary artery disease. He received ceftriaxone and azithromycin in the ED and was transitioned to Unasyn on admission. Prednisone was continued in addition to DuoNeb and albuterol treatments and symptomatic care. He had gradual improvement of his weakness and dyspnea. Blood cultures showed no growth and he remained hemodynamically stable without fever or leukocytosis. He received pneumonia vaccine prior to discharge. ?  ?Cachexia ?Patient weighs 43.9 kg at time of admission.  ?Several labs collected at admission to further assess cachexia including vitamin B12 and folate, as well as hepatic function panel, which were all normal.  ?  ?History of CHF ?Echocardiogram 03/12 showed LV EF 55-60% with normal function and no regional wall motion abnormalities; RV with low normal systolic function and normal size with normal pulmonary artery systolic pressure; normal mitral valve with trivial regurgitation; normal aortic valve. ?  ?CAD ?Lipid panel collected which revealed LDL at goal at 52. ASCVD 7.5%. He was continued on home atorvastatin 20 mg daily. ?

## 2021-08-27 NOTE — Care Management (Signed)
PCP assigned/recommended for patient to follow up ?

## 2021-08-27 NOTE — TOC Initial Note (Signed)
Transition of Care (TOC) - Initial/Assessment Note  ? ? ?Patient Details  ?Name: Jaime Cline ?MRN: [id number] ?Date of Birth: 1961-01-20 ? ?Transition of Care (TOC) CM/SW Contact:    ?Verdell Carmine, RN ?Phone Number: ?08/27/2021, 12:36 PM ? ?Clinical Narrative:                 ? ?Transition of Care Department [facility]) has reviewed patient and no TOC needs have been identified at this time. We will continue to monitor patient advancement through interdisciplinary progression rounds. If new patient transition needs arise, please place a TOC consult. ?  ?  ?  ?  ? ? ?Patient Goals and CMS Choice ?  ?  ?  ? ?Expected Discharge Plan and Services ?  ?  ?  ?  ?  ?                ?  ?  ?  ?  ?  ?  ?  ?  ?  ?  ? ?Prior Living Arrangements/Services ?  ?  ?  ?       ?  ?  ?  ?  ? ?Activities of Daily Living ?  ?  ? ?Permission Sought/Granted ?  ?  ?   ?   ?   ?   ? ?Emotional Assessment ?  ?  ?  ?  ?  ?  ? ?Admission diagnosis:  copd ?Patient Active Problem List  ? Diagnosis Date Noted  ? Asthma, chronic 12/31/2012  ? Smoker 12/31/2012  ? Knee pain, bilateral 12/31/2012  ? Iron deficiency anemia 12/31/2012  ? Knee pain, chronic 12/31/2012  ? ?PCP:  Default, Provider, MD ?Pharmacy:   ?[facility] [address] [address], [address] ?[place] [address] ?Phone: [phone] Fax: [phone] ? ?[id number] [address], [address] ?[address] ?[place] [address] ?Phone: [phone] Fax: [phone] ? ?[id number] [address] [place] [address] ?[place] [address] ?Phone: [phone] Fax: [phone] ? ? ? ? ?Social Determinants of Health (SDOH) Interventions ?  ? ?Readmission Risk Interventions ?No flowsheet data found. ? ? ?

## 2021-08-27 NOTE — ED Notes (Signed)
Was only able to obtain 1 set of blood culture as the pt is a hard stick. EDP made aware.  ?

## 2021-08-28 ENCOUNTER — Observation Stay (HOSPITAL_BASED_OUTPATIENT_CLINIC_OR_DEPARTMENT_OTHER): Payer: Medicaid - Out of State

## 2021-08-28 DIAGNOSIS — R0609 Other forms of dyspnea: Secondary | ICD-10-CM

## 2021-08-28 DIAGNOSIS — J189 Pneumonia, unspecified organism: Secondary | ICD-10-CM

## 2021-08-28 LAB — ECHOCARDIOGRAM COMPLETE
AR max vel: 2.59 cm2
AV Area VTI: 2.52 cm2
AV Area mean vel: 2.57 cm2
AV Mean grad: 1 mmHg
AV Peak grad: 2.5 mmHg
Ao pk vel: 0.8 m/s
Area-P 1/2: 5.23 cm2
Height: 72 in
S' Lateral: 2.4 cm
Weight: 1548.51 oz

## 2021-08-28 LAB — CBC
HCT: 31.2 % — ABNORMAL LOW (ref 39.0–52.0)
Hemoglobin: 10.9 g/dL — ABNORMAL LOW (ref 13.0–17.0)
MCH: 33.6 pg (ref 26.0–34.0)
MCHC: 34.9 g/dL (ref 30.0–36.0)
MCV: 96.3 fL (ref 80.0–100.0)
Platelets: 241 10*3/uL (ref 150–400)
RBC: 3.24 MIL/uL — ABNORMAL LOW (ref 4.22–5.81)
RDW: 13.3 % (ref 11.5–15.5)
WBC: 7 10*3/uL (ref 4.0–10.5)
nRBC: 0 % (ref 0.0–0.2)

## 2021-08-28 MED ORDER — PHENOL 1.4 % MT LIQD
1.0000 | OROMUCOSAL | Status: DC | PRN
  Administered 2021-08-28: 1 via OROMUCOSAL
  Filled 2021-08-28: qty 177

## 2021-08-28 MED ORDER — LOPERAMIDE HCL 2 MG PO CAPS
2.0000 mg | ORAL_CAPSULE | ORAL | Status: DC | PRN
  Administered 2021-08-28 – 2021-08-29 (×2): 2 mg via ORAL
  Filled 2021-08-28 (×2): qty 1

## 2021-08-28 MED ORDER — DM-GUAIFENESIN ER 30-600 MG PO TB12
1.0000 | ORAL_TABLET | Freq: Two times a day (BID) | ORAL | Status: DC
  Administered 2021-08-28 – 2021-08-29 (×2): 1 via ORAL
  Filled 2021-08-28 (×3): qty 1

## 2021-08-28 NOTE — Evaluation (Signed)
Physical Therapy Evaluation and Discharge ?Patient Details ?Name: Jaime Cline ?MRN: [id number] ?DOB: 05-23-1961 ?Today's Date: 08/28/2021 ? ?History of Present Illness ? Pt is a 61 y.o. M who presents 08/27/2021 with weakness and shortness of breath. Admitted with COPD exacerbation and RLL PNA. Significant PMH: COPD, heart failure, HTN.  ?Clinical Impression ? Patient evaluated by Physical Therapy with no further acute PT needs identified. Pt reports decreased cardiopulmonary endurance in comparison to baseline, but overall is mobilizing well. Ambulating 300 feet with no assistive device independently; SpO2 99-100% on RA. Education provided regarding activity recommendations and progress. All education has been completed and the patient has no further questions. No follow-up Physical Therapy or equipment needs. PT is signing off. Thank you for this referral. ? ?   ? ?Recommendations for follow up therapy are one component of a multi-disciplinary discharge planning process, led by the attending physician.  Recommendations may be updated based on patient status, additional functional criteria and insurance authorization. ? ?Follow Up Recommendations No PT follow up ? ?  ?Assistance Recommended at Discharge None  ?Patient can return home with the following ? Assist for transportation ? ?  ?Equipment Recommendations None recommended by PT  ?Recommendations for Other Services ?    ?  ?Functional Status Assessment Patient has had a recent decline in their functional status and demonstrates the ability to make significant improvements in function in a reasonable and predictable amount of time.  ? ?  ?Precautions / Restrictions Precautions ?Precautions: None ?Restrictions ?Weight Bearing Restrictions: No  ? ?  ? ?Mobility ? Bed Mobility ?Overal bed mobility: Independent ?  ?  ?  ?  ?  ?  ?  ?  ? ?Transfers ?Overall transfer level: Independent ?Equipment used: None ?  ?  ?  ?  ?  ?  ?  ?  ?  ? ?Ambulation/Gait ?Ambulation/Gait  assistance: Independent ?Gait Distance (Feet): 300 Feet ?Assistive device: None ?Gait Pattern/deviations: WFL(Within Functional Limits) ?  ?  ?  ?General Gait Details: No gross imbalance, steady pace ? ?Stairs ?  ?  ?  ?  ?  ? ?Wheelchair Mobility ?  ? ?Modified Rankin (Stroke Patients Only) ?  ? ?  ? ?Balance Overall balance assessment: No apparent balance deficits (not formally assessed) ?  ?  ?  ?  ?  ?  ?  ?  ?  ?  ?  ?  ?  ?  ?  ?  ?  ?  ?   ? ? ? ?Pertinent Vitals/Pain Pain Assessment ?Pain Assessment: No/denies pain  ? ? ?Home Living Family/patient expects to be discharged to:: Private residence ?Living Arrangements: Non-relatives/Friends ?Available Help at Discharge: Friend(s) ?Type of Home: House ?Home Access: Level entry ?  ?  ?  ?Home Layout: One level ?Home Equipment: None ?   ?  ?Prior Function Prior Level of Function : Independent/Modified Independent ?  ?  ?  ?  ?  ?  ?Mobility Comments: Recently moved from [place], having difficulty changing insurance and has been paying for medications out of pocket. takes rest breaks and modifies activities ?  ?  ? ? ?Hand Dominance  ?   ? ?  ?Extremity/Trunk Assessment  ? Upper Extremity Assessment ?Upper Extremity Assessment: Overall WFL for tasks assessed ?  ? ?Lower Extremity Assessment ?Lower Extremity Assessment: Overall WFL for tasks assessed ?  ? ?Cervical / Trunk Assessment ?Cervical / Trunk Assessment: Other exceptions ?Cervical / Trunk Exceptions: cachetic  ?Communication  ?  Communication: No difficulties  ?Cognition Arousal/Alertness: Awake/alert ?Behavior During Therapy: [name] for tasks assessed/performed ?Overall Cognitive Status: Within Functional Limits for tasks assessed ?  ?  ?  ?  ?  ?  ?  ?  ?  ?  ?  ?  ?  ?  ?  ?  ?  ?  ?  ? ?  ?General Comments   ? ?  ?Exercises    ? ?Assessment/Plan  ?  ?PT Assessment Patient does not need any further PT services  ?PT Problem List   ? ?   ?  ?PT Treatment Interventions     ? ?PT Goals (Current goals can be found in  the Care Plan section)  ?Acute Rehab PT Goals ?Patient Stated Goal: improve activity level, get access to medications ?PT Goal Formulation: All assessment and education complete, DC therapy ? ?  ?Frequency   ?  ? ? ?Co-evaluation   ?  ?  ?  ?  ? ? ?  ?AM-PAC PT "6 Clicks" Mobility  ?Outcome Measure Help needed turning from your back to your side while in a flat bed without using bedrails?: None ?Help needed moving from lying on your back to sitting on the side of a flat bed without using bedrails?: None ?Help needed moving to and from a bed to a chair (including a wheelchair)?: None ?Help needed standing up from a chair using your arms (e.g., wheelchair or bedside chair)?: None ?Help needed to walk in hospital room?: None ?Help needed climbing 3-5 steps with a railing? : A Little ?6 Click Score: 23 ? ?  ?End of Session   ?Activity Tolerance: Patient tolerated treatment well ?Patient left: in bed;with call bell/phone within reach ?Nurse Communication: Mobility status ?PT Visit Diagnosis: Difficulty in walking, not elsewhere classified (R26.2) ?  ? ?Time: [id number] ?PT Time Calculation (min) (ACUTE ONLY): 28 min ? ? ?Charges:   PT Evaluation ?$PT Eval Low Complexity: 1 Low ?PT Treatments ?$Therapeutic Activity: 8-22 mins ?  ?   ? ? ?Jaime Cline, PT, DPT ?Acute Rehabilitation Services ?Pager [phone] ?Office [phone] ? ? ?Jaime Cline ?08/28/2021, 1:04 PM ? ?

## 2021-08-28 NOTE — Progress Notes (Signed)
? ?HD#0 ?SUBJECTIVE:  ?Patient Summary: Jaime Cline is a 61 y.o. male with PMH of COPD, heart failure, HTN, and gastritis who presents with weakness and shortness of breath admitted for COPD exacerbation and RLL pneumonia. ? ?Overnight Events: None ? ?Interim History: Patient reports feeling some improvement of dyspnea but is still weak with ambulation and dyspneic. He confirms that he lives with a friend in [place] and would feel safe discharging there when he is medically stable to do so. ? ?OBJECTIVE:  ?Vital Signs: ?Vitals:  ? 08/28/21 0025 08/28/21 0432 08/28/21 0740 08/28/21 1740  ?BP: 125/74 104/76 117/82   ?Pulse: 91 84 80 72  ?Resp: (!) 22 19 18 16   ?Temp:  98.2 ?F (36.8 ?C) 98.1 ?F (36.7 ?C)   ?TempSrc:  Oral Oral   ?SpO2:  100% 100%   ?Weight:      ?Height:      ? ?Supplemental O2: Room Air ?SpO2: 100 % ? ?Filed Weights  ? 08/27/21 2053  ?Weight: 43.9 kg  ? ? ? ?Intake/Output Summary (Last 24 hours) at 08/28/2021 1308 ?Last data filed at 08/28/2021 0600 ?Gross per 24 hour  ?Intake 440 ml  ?Output --  ?Net 440 ml  ? ?Net IO Since Admission: 440 mL [08/28/21 1308] ? ?Physical Exam: ?Constitutional:Cachectic gentleman resting comfortably in bed, no acute distress. ?Cardio:Regular rate and rhythm. No murmurs, rubs, gallops. ?Pulm:Mild diffuse wheezing with some rhonchi. Decreased breath sounds. Normal work of breathing on room air. ?Abdomen:Soft, nontender, nondistended. ?10/28/21 bulk and tone.  ?Skin:Warm and dry. ?Neuro:Alert and oriented x3. No focal deficit noted. ?Psych:Normal mood and affect. ? ?Patient Lines/Drains/Airways Status   ? ? Active Line/Drains/Airways   ? ? Name Placement date Placement time Site Days  ? Peripheral IV 08/27/21 24 G Anterior;Proximal;Right Forearm 08/27/21  0847  Forearm  1  ? Peripheral IV 08/27/21 20 G 1" Left Antecubital 08/27/21  1123  Antecubital  1  ? ?  ?  ? ?  ? ? ? ?ASSESSMENT/PLAN:  ?Assessment: ?Principal Problem: ?  COPD exacerbation  (HCC) ? ? ?Plan: ?COPD exacerbation ?RLL pneumonia ?Patient endorses continued weakness of his LE with ambulation. Diffuse expiratory wheezing and occasional rhonchi with cough noted on physical exam. Patient endorses some improvement of dyspnea with breathing treatments and steroid therapy since admission. Today is day 2 of unasyn of RLL pneumonia (CAP vs. Aspiration). Blood culture shows NGTD. ?-Continue Unasyn 3 g IV q6h  ?-Switch to amoxicillin-clavulanate + azithromycin at discharge for total of 5 days of treatment ?-F/u expectorate sputum with nursing ?-F/u blood culture ?-Encourage PO intake ?-Continue Proventil, DuoNeb breathing treatments ?-Prednisone 40 mg PO daily ?-Trend CBC ?  ?Cachexia ?Several labs collected at admission to further assess cachexia including vitamin B12 and folate, as well as hepatic function panel, which were all normal.  COPD may very well be contributing to malnourished state due to increased calorie expenditure in work of breathing. I also worry that he has trouble with access to food--he denies working or being outside often but has quite tan with a tan line. He did just come from 10/27/21 but homelessness is something to keep in mind. Given history of alcohol abuse I worry that, if he is still drinking, this could be playing a large role in malnourishment however he denies alcohol use at this time. PT consulted and does not have further recommendations. ?-RD consulted ?  ?History of CHF ?Echocardiogram 03/12 showed LV EF 55-60% with normal function and no regional wall motion  abnormalities; RV with low normal systolic function and normal size with normal pulmonary artery systolic pressure; normal mitral valve with trivial regurgitation; normal aortic valve. ?  ?CAD ?LDL at goal at 52. ASCVD 7.5%, recommending moderate-high intensity statin. Currently managed with atorvastatin 20 mg daily.  ?-Continue home atorvastatin 20 mg daily ? ?Best Practice: ?Diet: Cardiac diet ?IVF:  None ?VTE: enoxaparin (LOVENOX) injection 40 mg Start: 08/27/21 1500 ?Code: Full ?AB: Unasyn 3 g IV q6h ?Therapy Recs: None ?DISPO: Anticipated discharge tomorrow to Home pending Medical stability. ? ?Signature: ?Champ Mungo, D.O.  ?Internal Medicine Resident, PGY-1 ?[facility]  ?Pager: [phone] ?1:08 PM, 08/28/2021  ? ?Please contact the on call pager after 5 pm and on weekends at [phone].  ?

## 2021-08-28 NOTE — Progress Notes (Signed)
Echocardiogram ?2D Echocardiogram has been performed. ? ?Jaime Cline ?08/28/2021, 12:37 PM ?

## 2021-08-29 ENCOUNTER — Other Ambulatory Visit (HOSPITAL_COMMUNITY): Payer: Self-pay

## 2021-08-29 LAB — CBC
HCT: 31.8 % — ABNORMAL LOW (ref 39.0–52.0)
Hemoglobin: 10.9 g/dL — ABNORMAL LOW (ref 13.0–17.0)
MCH: 34.1 pg — ABNORMAL HIGH (ref 26.0–34.0)
MCHC: 34.3 g/dL (ref 30.0–36.0)
MCV: 99.4 fL (ref 80.0–100.0)
Platelets: 235 10*3/uL (ref 150–400)
RBC: 3.2 MIL/uL — ABNORMAL LOW (ref 4.22–5.81)
RDW: 13.8 % (ref 11.5–15.5)
WBC: 7.3 10*3/uL (ref 4.0–10.5)
nRBC: 0 % (ref 0.0–0.2)

## 2021-08-29 LAB — BASIC METABOLIC PANEL
Anion gap: 7 (ref 5–15)
BUN: 9 mg/dL (ref 6–20)
CO2: 27 mmol/L (ref 22–32)
Calcium: 8.9 mg/dL (ref 8.9–10.3)
Chloride: 100 mmol/L (ref 98–111)
Creatinine, Ser: 0.84 mg/dL (ref 0.61–1.24)
GFR, Estimated: >60 mL/min (ref >60)
Glucose, Bld: 114 mg/dL — ABNORMAL HIGH (ref 70–99)
Potassium: 4 mmol/L (ref 3.5–5.1)
Sodium: 134 mmol/L — ABNORMAL LOW (ref 135–145)

## 2021-08-29 MED ORDER — AMOXICILLIN-POT CLAVULANATE 875-125 MG PO TABS
1.0000 | ORAL_TABLET | Freq: Two times a day (BID) | ORAL | 0 refills | Status: AC
  Filled 2021-08-29: qty 6, 3d supply, fill #0

## 2021-08-29 MED ORDER — ATORVASTATIN CALCIUM 20 MG PO TABS
20.0000 mg | ORAL_TABLET | Freq: Every day | ORAL | 1 refills | Status: DC
  Filled 2021-08-29: qty 30, 30d supply, fill #0

## 2021-08-29 MED ORDER — ALBUTEROL SULFATE HFA 108 (90 BASE) MCG/ACT IN AERS: 2.0000 | INHALATION_SPRAY | RESPIRATORY_TRACT | 0 refills | Status: DC | PRN

## 2021-08-29 MED ORDER — LISINOPRIL 20 MG PO TABS
20.0000 mg | ORAL_TABLET | Freq: Every day | ORAL | 1 refills | Status: DC
  Filled 2021-08-29: qty 30, 30d supply, fill #0

## 2021-08-29 MED ORDER — FLUTICASONE-SALMETEROL 100-50 MCG/DOSE IN AEPB: 1.0000 | INHALATION_SPRAY | Freq: Two times a day (BID) | RESPIRATORY_TRACT | 0 refills | Status: DC

## 2021-08-29 MED ORDER — PREDNISONE 20 MG PO TABS
40.0000 mg | ORAL_TABLET | Freq: Every day | ORAL | 0 refills | Status: DC
  Filled 2021-08-29: qty 2, 1d supply, fill #0

## 2021-08-29 MED ORDER — PREDNISONE 20 MG PO TABS
40.0000 mg | ORAL_TABLET | Freq: Every day | ORAL | 0 refills | Status: AC
  Filled 2021-08-29: qty 6, 3d supply, fill #0

## 2021-08-29 NOTE — Discharge Summary (Signed)
Name: Jaime Cline MRN: [id number] DOB: [birth year] 61 y.o. PCP: Default, Provider, MD  Date of Admission: 08/27/2021  8:09 AM Date of Discharge:  08/29/2021 Attending Physician: Dr. Heide Spark  DISCHARGE DIAGNOSIS:  Primary Problem: COPD exacerbation [facility])   Hospital Problems: Principal Problem:   COPD exacerbation (HCC) Active Problems:   Community acquired pneumonia of right lower lobe of lung    DISCHARGE MEDICATIONS:   Allergies as of 08/29/2021       Reactions   Other Other (See Comments)   BP med from [facility] about 3 years ago, caused some seizures, due to getting wrong BP med at that time.  Unknown name of med        Medication List     STOP taking these medications    methocarbamol 750 MG tablet Commonly known as: Robaxin-750   traMADol 50 MG tablet Commonly known as: ULTRAM       TAKE these medications    albuterol 108 (90 Base) MCG/ACT inhaler Commonly known as: VENTOLIN HFA Inhale 2 puffs into the lungs every 4 (four) hours as needed for wheezing or shortness of breath.   amoxicillin-clavulanate 875-125 MG tablet Commonly known as: Augmentin Take 1 tablet by mouth 2 (two) times daily for 3 days.   atorvastatin 20 MG tablet Commonly known as: LIPITOR Take 1 tablet (20 mg total) by mouth daily.   famotidine 20 MG tablet Commonly known as: PEPCID Take 20 mg by mouth 2 (two) times daily.   Fluticasone-Salmeterol 100-50 MCG/DOSE Aepb Commonly known as: ADVAIR Inhale 1 puff into the lungs 2 (two) times daily.   lisinopril 20 MG tablet Commonly known as: ZESTRIL Take 1 tablet (20 mg total) by mouth daily.   predniSONE 20 MG tablet Commonly known as: DELTASONE Take 2 tablets (40 mg total) by mouth daily with breakfast for 3 days. Start taking on: August 30, 2021   sertraline 50 MG tablet Commonly known as: ZOLOFT Take 50 mg by mouth daily.        DISPOSITION AND FOLLOW-UP:  Jaime Cline was discharged from [facility] in Stable condition. At the hospital follow up visit please address:  COPD exacerbation RLL pneumonia Follow-up blood cultures collected during admission. Ensure that he finished outpatient antibiotics and prednisone. Refer to pulmonology for PFTs and chronic COPD care. Provide patient with information for Gold card as he is uninsured.   Cachexia Consider iron studies given history of alcoholism and macrocytosis at admission. Consider initiating cancer screenings as it is unclear what he has or has not had done and when, including colonoscopy. He endorsed having a safe home but may not have ready access to food and may benefit from taking some food items in our clinic home. He denies current alcohol or other substance use but has a history of alcohol abuse and may benefit from substance abuse counseling re: malnutrition.  Follow-up Recommendations: Consults: Pulmonary medicine Labs: Basic Metabolic Profile and CBC Studies: Colonoscopy, PFTs Medications:   START taking: Augmentin 875-125 mg twice daily for three days for pneumonia Prednisone 40 mg daily for three days for COPD exacerbation CONTINUE taking: Albuterol inhaler Atorvastatin Famotidine Advair Lisinopril 3. Address reported historical medication at HFU:  A. Sertraline  Follow-up Appointments:  Follow-up Information     Evlyn Kanner, MD Follow up on 08/31/2021.   Specialty: Internal Medicine Why: At 9:15 AM Contact information: [address] [place] [address] [phone]  HOSPITAL COURSE:  Patient Summary: COPD exacerbation RLL pneumonia Patient presented with weakness and shortness of breath that had gradually worsened over the previous two months with worsening of the weakness in the previous week. He had white sputum with productive cough. At admission labs remarkable for mildly elevated BNP of 102.5, lactic acidosis of 2.8,. Chest x-ray remarkable for R basilar airspace  opacity. CTA chest remarkable for RLL pneumonia, mild diffuse bronchial wall thickening with moderate to severe centrilobular and paraseptal emphysema; imaging findings suggestive of underlying COPD, aortic atherosclerosis, in addition to 2 vessel coronary artery disease. He received ceftriaxone and azithromycin in the ED and was transitioned to Unasyn on admission. Prednisone was continued in addition to DuoNeb and albuterol treatments and symptomatic care. He had gradual improvement of his weakness and dyspnea. Blood cultures showed no growth and he remained hemodynamically stable without fever or leukocytosis. He received pneumonia vaccine prior to discharge.   Cachexia Patient weighs 43.9 kg at time of admission.  Several labs collected at admission to further assess cachexia including vitamin B12 and folate, as well as hepatic function panel, which were all normal.    History of CHF Echocardiogram 03/12 showed LV EF 55-60% with normal function and no regional wall motion abnormalities; RV with low normal systolic function and normal size with normal pulmonary artery systolic pressure; normal mitral valve with trivial regurgitation; normal aortic valve.   CAD Lipid panel collected which revealed LDL at goal at 52. ASCVD 7.5%. He was continued on home atorvastatin 20 mg daily.   DISCHARGE INSTRUCTIONS:   Discharge Instructions     Diet - low sodium heart healthy   Complete by: As directed    Discharge instructions   Complete by: As directed    Jaime Cline,  It was a pleasure to care for you during your stay at [facility]. I am glad that you are feeling much better!  When you go home you will have two new medications to take:  a. Augmentin 875-125 mg twice daily for three days for pneumonia  b. Prednisone 40 mg daily for three days for COPD exacerbation  I have also sent refills for your other medications. You have a follow-up appointment scheduled for 03/15 at 9:15 AM at the  [facility] here at the hospital.   My best, Dr. August Saucer   Increase activity slowly   Complete by: As directed        SUBJECTIVE:   Patient evaluated at bedside on day of discharge. He says that his cough and shortness of breath are improved. He has trouble with a cough when he lays down at times and has trouble sleeping in general. He denies abdominal pain and endorses good appetite. Weakness feels improved.   Discharge Vitals:   BP 128/81 (BP Location: Left Arm)    Pulse 86    Temp 98.3 F (36.8 C) (Oral)    Resp 20    Ht 6' (1.829 m)    Wt 43.9 kg    SpO2 100%    BMI 13.13 kg/m   OBJECTIVE:  Constitutional:Cachectic gentleman resting comfortably in bed, no acute distress. Cardio:Regular rate and rhythm. Pulm:Clear to auscultation bilaterally. Decreased breath sounds. Normal work of breathing on room air. Abdomen:Soft, nontender, nondistended. ZOX:[id number] bulk and tone.  Skin:Warm and dry. Neuro:Alert and oriented x3. No focal deficit noted. Psych:Normal mood and affect   Pertinent Labs, Studies, and Procedures:  CBC Latest Ref Rng & Units 08/29/2021 08/28/2021 08/27/2021  WBC 4.0 -  10.5 K/uL 7.3 7.0 4.8  Hemoglobin 13.0 - 17.0 g/dL 10.9(L) 10.9(L) 13.5  Hematocrit 39.0 - 52.0 % 31.8(L) 31.2(L) 41.7  Platelets 150 - 400 K/uL 235 241 258    CMP Latest Ref Rng & Units 08/29/2021 08/27/2021 03/26/2014  Glucose 70 - 99 mg/dL 161(W) 92 960(A)  BUN 6 - 20 mg/dL Creatinine 0.61 - 1.24 mg/dL 5.40 9.81 1.91  Sodium 135 - 145 mmol/L 134(L) 133(L) 140  Potassium 3.5 - 5.1 mmol/L 4.0 4.1 3.6(L)  Chloride 98 - 111 mmol/L 100 99 102  CO2 22 - 32 mmol/L 27 21(L) 23  Calcium 8.9 - 10.3 mg/dL 8.9 9.6 8.8  Total Protein 6.5 - 8.1 g/dL - 6.6 8.0  Total Bilirubin 0.3 - 1.2 mg/dL - 0.3 <4.7(W)  Alkaline Phos 38 - 126 U/L - 60 61  AST 15 - 41 U/L - 26 44(H)  ALT 0 - 44 U/L - 14 19    CT Angio Chest PE W and/or Wo Contrast  Result Date: 08/27/2021 CLINICAL DATA:   61 year old male with history of shortness of breath. Evaluate for pulmonary embolism. EXAM: CT ANGIOGRAPHY CHEST WITH CONTRAST TECHNIQUE: Multidetector CT imaging of the chest was performed using the standard protocol during bolus administration of intravenous contrast. Multiplanar CT image reconstructions and MIPs were obtained to evaluate the vascular anatomy. RADIATION DOSE REDUCTION: This exam was performed according to the departmental dose-optimization program which includes automated exposure control, adjustment of the mA and/or kV according to patient size and/or use of iterative reconstruction technique. CONTRAST:  75mL OMNIPAQUE IOHEXOL 350 MG/ML SOLN COMPARISON:  No priors. FINDINGS: Cardiovascular: No filling defects within the pulmonary arterial tree to suggest pulmonary embolism. Heart size is normal. There is no significant pericardial fluid, thickening or pericardial calcification. There is aortic atherosclerosis, as well as atherosclerosis of the great vessels of the mediastinum and the coronary arteries, including calcified atherosclerotic plaque in the left anterior descending and left circumflex coronary arteries. Mediastinum/Nodes: Mediastinal or no pathologically enlarged hilar lymph nodes. Esophagus is unremarkable in appearance. No axillary lymphadenopathy. Lungs/Pleura: Some peripheral predominant airspace consolidation is noted in the right lower lobe. Lungs are otherwise clear. No pleural effusions. No pneumothorax. No definite suspicious appearing pulmonary nodules or masses are noted. Diffuse bronchial wall thickening with moderate to severe centrilobular and paraseptal emphysema. Upper Abdomen: Aortic atherosclerosis. Musculoskeletal: There are no aggressive appearing lytic or blastic lesions noted in the visualized portions of the skeleton. Review of the MIP images confirms the above findings. IMPRESSION: 1. No evidence of pulmonary embolism. 2. Right lower lobe pneumonia. 3. Mild  diffuse bronchial wall thickening with moderate to severe centrilobular and paraseptal emphysema; imaging findings suggestive of underlying COPD. 4. Aortic atherosclerosis, in addition to 2 vessel coronary artery disease. Please note that although the presence of coronary artery calcium documents the presence of coronary artery disease, the severity of this disease and any potential stenosis cannot be assessed on this non-gated CT examination. Assessment for potential risk factor modification, dietary therapy or pharmacologic therapy may be warranted, if clinically indicated. Aortic Atherosclerosis (ICD10-I70.0) and Emphysema (ICD10-J43.9). Electronically Signed   By: Trudie Reed M.D.   On: 08/27/2021 12:21   DG Chest Port 1 View  Result Date: 08/27/2021 CLINICAL DATA:  Shortness of breath. EXAM: PORTABLE CHEST 1 VIEW COMPARISON:  12/26/2013 and prior radiographs. FINDINGS: The cardiomediastinal silhouette is unremarkable. Increased lung volumes are identified. There is focal airspace opacity at the lateral RIGHT lung base. There is no  evidence of pleural effusion, mass, pneumothorax or acute bony abnormality. IMPRESSION: RIGHT basilar airspace opacity, question pneumonia/aspiration. Radiographic follow-up to resolution is recommended. Electronically Signed   By: Harmon Pier M.D.   On: 08/27/2021 09:45   ECHOCARDIOGRAM COMPLETE  Result Date: 08/28/2021    ECHOCARDIOGRAM REPORT   Patient Name:   AKON REINOSO Date of Exam: 08/28/2021 Medical Rec #:  [id number]    Height:       72.0 in Accession #:    [id number]   Weight:       96.8 lb Date of Birth:  07-29-1960    BSA:          1.565 m Patient Age:    60 years     BP:           117/82 mmHg Patient Gender: M            HR:           80 bpm. Exam Location:  Inpatient Procedure: 2D Echo Indications:    Dyspnea  History:        Patient has no prior history of Echocardiogram examinations.                 Risk Factors:Hypertension.  Sonographer:    Devonne Doughty  Referring Phys: [id number] Reymundo Poll  Sonographer Comments: Image acquisition challenging due to patient body habitus. IMPRESSIONS  1. Left ventricular ejection fraction, by estimation, is 55 to 60%. The left ventricle has normal function. The left ventricle has no regional wall motion abnormalities. Left ventricular diastolic parameters were normal.  2. Right ventricular systolic function is low normal. The right ventricular size is normal. There is normal pulmonary artery systolic pressure.  3. The mitral valve is normal in structure. Trivial mitral valve regurgitation.  4. The aortic valve is normal in structure. Aortic valve regurgitation is not visualized.  5. The inferior vena cava is dilated in size with <50% respiratory variability, suggesting right atrial pressure of 15 mmHg. FINDINGS  Left Ventricle: Left ventricular ejection fraction, by estimation, is 55 to 60%. The left ventricle has normal function. The left ventricle has no regional wall motion abnormalities. The left ventricular internal cavity size was normal in size. There is  no left ventricular hypertrophy. Left ventricular diastolic parameters were normal. Right Ventricle: The right ventricular size is normal. Right vetricular wall thickness was not assessed. Right ventricular systolic function is low normal. There is normal pulmonary artery systolic pressure. The tricuspid regurgitant velocity is 2.64 m/s, and with an assumed right atrial pressure of 8 mmHg, the estimated right ventricular systolic pressure is 35.9 mmHg. Left Atrium: Left atrial size was normal in size. Right Atrium: Right atrial size was normal in size. Pericardium: There is no evidence of pericardial effusion. Mitral Valve: The mitral valve is normal in structure. Trivial mitral valve regurgitation. Tricuspid Valve: The tricuspid valve is normal in structure. Tricuspid valve regurgitation is trivial. Aortic Valve: The aortic valve is normal in structure. Aortic valve  regurgitation is not visualized. Aortic valve mean gradient measures 1.0 mmHg. Aortic valve peak gradient measures 2.5 mmHg. Aortic valve area, by VTI measures 2.52 cm. Pulmonic Valve: The pulmonic valve was not well visualized. Pulmonic valve regurgitation is not visualized. Aorta: The aortic root and ascending aorta are structurally normal, with no evidence of dilitation. Venous: The inferior vena cava is dilated in size with less than 50% respiratory variability, suggesting right atrial pressure of 15 mmHg. IAS/Shunts: No atrial level shunt detected  by color flow Doppler.  LEFT VENTRICLE PLAX 2D LVIDd:         3.30 cm   Diastology LVIDs:         2.40 cm   LV e' medial:    10.10 cm/s LV PW:         0.70 cm   LV E/e' medial:  11.0 LV IVS:        0.70 cm   LV e' lateral:   10.60 cm/s LVOT diam:     2.00 cm   LV E/e' lateral: 10.5 LV SV:         43 LV SV Index:   27 LVOT Area:     3.14 cm  RIGHT VENTRICLE             IVC RV Basal diam:  2.50 cm     IVC diam: 2.10 cm RV Mid diam:    2.00 cm RV S prime:     14.50 cm/s TAPSE (M-mode): 2.3 cm LEFT ATRIUM             Index        RIGHT ATRIUM          Index LA diam:        3.00 cm 1.92 cm/m   RA Area:     8.35 cm LA Vol (A2C):   23.9 ml 15.27 ml/m  RA Volume:   15.10 ml 9.65 ml/m LA Vol (A4C):   27.0 ml 17.25 ml/m LA Biplane Vol: 26.7 ml 17.06 ml/m  AORTIC VALVE                    PULMONIC VALVE AV Area (Vmax):    2.59 cm     PV Vmax:       0.68 m/s AV Area (Vmean):   2.57 cm     PV Peak grad:  1.8 mmHg AV Area (VTI):     2.52 cm AV Vmax:           79.50 cm/s AV Vmean:          54.700 cm/s AV VTI:            0.169 m AV Peak Grad:      2.5 mmHg AV Mean Grad:      1.0 mmHg LVOT Vmax:         65.65 cm/s LVOT Vmean:        44.700 cm/s LVOT VTI:          0.136 m LVOT/AV VTI ratio: 0.80  AORTA Ao Root diam: 2.50 cm MITRAL VALVE                TRICUSPID VALVE MV Area (PHT): 5.23 cm     TR Peak grad:   27.9 mmHg MV Decel Time: 145 msec     TR Vmax:        264.00 cm/s  MV E velocity: 111.00 cm/s MV A velocity: 51.00 cm/s   SHUNTS MV E/A ratio:  2.18         Systemic VTI:  0.14 m                             Systemic Diam: 2.00 cm Dietrich Pates MD Electronically signed by Dietrich Pates MD Signature Date/Time: 08/28/2021/12:54:48 PM    Final      Signed: Champ Mungo, D.O.  Internal Medicine Resident, PGY-1 [facility]  Pager: [phone] 11:53 AM, 08/29/2021

## 2021-08-29 NOTE — TOC Transition Note (Signed)
Transition of Care (TOC) - CM/SW Discharge Note ? ? ?Patient Details  ?Name: Jaime Cline ?MRN: [id number] ?Date of Birth: 13-Oct-1960 ? ?Transition of Care (TOC) CM/SW Contact:  ?Tom-Johnson, Renea Ee, RN ?Phone Number: ?08/29/2021, 2:26 PM ? ? ?Clinical Narrative:    ? ?Patient is scheduled for discharge today. Admitted for COPD Exacerbation. From home with a friend. Has one son. Currently on disability. Patient states he recently moved to [place] from [place]. New patient appointment scheduled with Sanjuan Dame, MD for 08/31/21. Information on AVS. No PT f/u noted. Prescriptions sent to [facility] and was delivered to patient at bedside. Denies any other needs. Friend to transport at discharge. No further TOC needs noted.  ? ?Final next level of care: Home/Self Care ?Barriers to Discharge: Barriers Resolved ? ? ?Patient Goals and CMS Choice ?Patient states their goals for this hospitalization and ongoing recovery are:: To return home ?CMS Medicare.gov Compare Post Acute Care list provided to:: Patient ?Choice offered to / list presented to : NA ? ?Discharge Placement ?  ?           ?  ?Patient to be transferred to facility by: Friend ?  ?  ? ?Discharge Plan and Services ?  ?  ?           ?DME Arranged: N/A ?DME Agency: NA ?  ?  ?  ?HH Arranged: NA ?[place] Agency: NA ?  ?  ?  ? ?Social Determinants of Health (SDOH) Interventions ?  ? ? ?Readmission Risk Interventions ?No flowsheet data found. ? ? ? ? ?

## 2021-08-29 NOTE — Plan of Care (Signed)
Pt to be d/c from 4E to home with friend. D/c instructions and medication education provided. Pt expresses understanding. Tele and IV removed.  ? ?Brooke Pace, RN ?08/29/21 ?1:51 PM ? ? ? ?Problem: Education: ?Goal: Knowledge of disease or condition will improve ?Outcome: Adequate for Discharge ?Goal: Knowledge of the prescribed therapeutic regimen will improve ?Outcome: Adequate for Discharge ?Goal: Individualized Educational Video(s) ?Outcome: Adequate for Discharge ?  ?Problem: Activity: ?Goal: Ability to tolerate increased activity will improve ?Outcome: Adequate for Discharge ?Goal: Will verbalize the importance of balancing activity with adequate rest periods ?Outcome: Adequate for Discharge ?  ?Problem: Respiratory: ?Goal: Ability to maintain a clear airway will improve ?Outcome: Adequate for Discharge ?Goal: Levels of oxygenation will improve ?Outcome: Adequate for Discharge ?Goal: Ability to maintain adequate ventilation will improve ?Outcome: Adequate for Discharge ?  ?Problem: Activity: ?Goal: Ability to tolerate increased activity will improve ?Outcome: Adequate for Discharge ?  ?Problem: Clinical Measurements: ?Goal: Ability to maintain a body temperature in the normal range will improve ?Outcome: Adequate for Discharge ?  ?Problem: Respiratory: ?Goal: Ability to maintain adequate ventilation will improve ?Outcome: Adequate for Discharge ?Goal: Ability to maintain a clear airway will improve ?Outcome: Adequate for Discharge ?  ?

## 2021-08-31 ENCOUNTER — Encounter: Payer: PRIVATE HEALTH INSURANCE | Admitting: Student

## 2021-09-01 LAB — CULTURE, BLOOD (ROUTINE X 2)
Culture: NO GROWTH
Culture: NO GROWTH
Special Requests: ADEQUATE

## 2021-09-08 ENCOUNTER — Ambulatory Visit: Payer: PRIVATE HEALTH INSURANCE | Admitting: Student

## 2021-09-25 ENCOUNTER — Inpatient Hospital Stay (HOSPITAL_COMMUNITY)
Admission: EM | Admit: 2021-09-25 | Discharge: 2021-10-03 | DRG: 312 | Disposition: A | Discharge status: Home Health | Payer: Medicaid Other | Attending: Student in an Organized Health Care Education/Training Program | Admitting: Student in an Organized Health Care Education/Training Program

## 2021-09-25 DIAGNOSIS — I10 Essential (primary) hypertension: Secondary | ICD-10-CM | POA: Diagnosis present

## 2021-09-25 DIAGNOSIS — F17210 Nicotine dependence, cigarettes, uncomplicated: Secondary | ICD-10-CM | POA: Diagnosis present

## 2021-09-25 DIAGNOSIS — E871 Hypo-osmolality and hyponatremia: Secondary | ICD-10-CM

## 2021-09-25 DIAGNOSIS — I951 Orthostatic hypotension: Principal | ICD-10-CM | POA: Diagnosis present

## 2021-09-25 DIAGNOSIS — E162 Hypoglycemia, unspecified: Secondary | ICD-10-CM

## 2021-09-25 DIAGNOSIS — E43 Unspecified severe protein-calorie malnutrition: Secondary | ICD-10-CM | POA: Insufficient documentation

## 2021-09-25 DIAGNOSIS — Z79899 Other long term (current) drug therapy: Secondary | ICD-10-CM

## 2021-09-25 DIAGNOSIS — Z681 Body mass index (BMI) 19 or less, adult: Secondary | ICD-10-CM

## 2021-09-25 DIAGNOSIS — F1090 Alcohol use, unspecified, uncomplicated: Secondary | ICD-10-CM

## 2021-09-25 DIAGNOSIS — G8929 Other chronic pain: Secondary | ICD-10-CM | POA: Diagnosis present

## 2021-09-25 DIAGNOSIS — M549 Dorsalgia, unspecified: Secondary | ICD-10-CM | POA: Diagnosis present

## 2021-09-25 DIAGNOSIS — R0902 Hypoxemia: Secondary | ICD-10-CM | POA: Diagnosis present

## 2021-09-25 DIAGNOSIS — R64 Cachexia: Secondary | ICD-10-CM | POA: Diagnosis present

## 2021-09-25 DIAGNOSIS — E2740 Unspecified adrenocortical insufficiency: Secondary | ICD-10-CM | POA: Diagnosis present

## 2021-09-25 DIAGNOSIS — E872 Acidosis, unspecified: Secondary | ICD-10-CM | POA: Diagnosis present

## 2021-09-25 DIAGNOSIS — R55 Syncope and collapse: Secondary | ICD-10-CM | POA: Diagnosis present

## 2021-09-25 DIAGNOSIS — R7989 Other specified abnormal findings of blood chemistry: Secondary | ICD-10-CM

## 2021-09-25 DIAGNOSIS — N179 Acute kidney failure, unspecified: Secondary | ICD-10-CM

## 2021-09-25 DIAGNOSIS — K7010 Alcoholic hepatitis without ascites: Secondary | ICD-10-CM | POA: Diagnosis present

## 2021-09-25 DIAGNOSIS — J439 Emphysema, unspecified: Secondary | ICD-10-CM | POA: Diagnosis present

## 2021-09-25 DIAGNOSIS — K224 Dyskinesia of esophagus: Secondary | ICD-10-CM | POA: Diagnosis present

## 2021-09-25 DIAGNOSIS — Y907 Blood alcohol level of 200-239 mg/100 ml: Secondary | ICD-10-CM | POA: Diagnosis present

## 2021-09-25 DIAGNOSIS — E860 Dehydration: Secondary | ICD-10-CM | POA: Diagnosis present

## 2021-09-25 DIAGNOSIS — E861 Hypovolemia: Secondary | ICD-10-CM

## 2021-09-25 DIAGNOSIS — F10129 Alcohol abuse with intoxication, unspecified: Secondary | ICD-10-CM | POA: Diagnosis present

## 2021-09-26 ENCOUNTER — Other Ambulatory Visit: Payer: Self-pay

## 2021-09-26 ENCOUNTER — Observation Stay (HOSPITAL_COMMUNITY): Payer: Medicaid Other

## 2021-09-26 ENCOUNTER — Emergency Department (HOSPITAL_COMMUNITY): Payer: Medicaid Other

## 2021-09-26 ENCOUNTER — Encounter (HOSPITAL_COMMUNITY): Payer: Self-pay | Admitting: Emergency Medicine

## 2021-09-26 DIAGNOSIS — R55 Syncope and collapse: Secondary | ICD-10-CM | POA: Diagnosis not present

## 2021-09-26 LAB — VITAMIN B12: Vitamin B-12: 441 pg/mL (ref 180–914)

## 2021-09-26 LAB — CBC WITH DIFFERENTIAL/PLATELET
Abs Immature Granulocytes: 0.05 10*3/uL (ref 0.00–0.07)
Basophils Absolute: 0.1 10*3/uL (ref 0.0–0.1)
Basophils Relative: 1 %
Eosinophils Absolute: 0.1 10*3/uL (ref 0.0–0.5)
Eosinophils Relative: 1 %
HCT: 43.5 % (ref 39.0–52.0)
Hemoglobin: 15 g/dL (ref 13.0–17.0)
Immature Granulocytes: 1 %
Lymphocytes Relative: 23 %
Lymphs Abs: 1.6 10*3/uL (ref 0.7–4.0)
MCH: 34.1 pg — ABNORMAL HIGH (ref 26.0–34.0)
MCHC: 34.5 g/dL (ref 30.0–36.0)
MCV: 98.9 fL (ref 80.0–100.0)
Monocytes Absolute: 0.4 10*3/uL (ref 0.1–1.0)
Monocytes Relative: 5 %
Neutro Abs: 5 10*3/uL (ref 1.7–7.7)
Neutrophils Relative %: 69 %
Platelets: 184 10*3/uL (ref 150–400)
RBC: 4.4 MIL/uL (ref 4.22–5.81)
RDW: 13.8 % (ref 11.5–15.5)
WBC: 7.2 10*3/uL (ref 4.0–10.5)
nRBC: 0 % (ref 0.0–0.2)

## 2021-09-26 LAB — CBG MONITORING, ED: Glucose-Capillary: 157 mg/dL — ABNORMAL HIGH (ref 70–99)

## 2021-09-26 LAB — TSH: TSH: 1.877 u[IU]/mL (ref 0.350–4.500)

## 2021-09-26 LAB — BASIC METABOLIC PANEL
Anion gap: 10 (ref 5–15)
Anion gap: 8 (ref 5–15)
Anion gap: 10 (ref 5–15)
BUN: 10 mg/dL (ref 6–20)
BUN: 7 mg/dL (ref 6–20)
BUN: 6 mg/dL (ref 6–20)
CO2: 18 mmol/L — ABNORMAL LOW (ref 22–32)
CO2: 22 mmol/L (ref 22–32)
CO2: 22 mmol/L (ref 22–32)
Calcium: 8.4 mg/dL — ABNORMAL LOW (ref 8.9–10.3)
Calcium: 8.7 mg/dL — ABNORMAL LOW (ref 8.9–10.3)
Calcium: 8.9 mg/dL (ref 8.9–10.3)
Chloride: 103 mmol/L (ref 98–111)
Chloride: 99 mmol/L (ref 98–111)
Chloride: 98 mmol/L (ref 98–111)
Creatinine, Ser: 1.09 mg/dL (ref 0.61–1.24)
Creatinine, Ser: 1.01 mg/dL (ref 0.61–1.24)
Creatinine, Ser: 0.94 mg/dL (ref 0.61–1.24)
GFR, Estimated: >60 mL/min (ref >60)
GFR, Estimated: >60 mL/min (ref >60)
GFR, Estimated: >60 mL/min (ref >60)
Glucose, Bld: 179 mg/dL — ABNORMAL HIGH (ref 70–99)
Glucose, Bld: 191 mg/dL — ABNORMAL HIGH (ref 70–99)
Glucose, Bld: 136 mg/dL — ABNORMAL HIGH (ref 70–99)
Potassium: 4.5 mmol/L (ref 3.5–5.1)
Potassium: 4.6 mmol/L (ref 3.5–5.1)
Potassium: 4.3 mmol/L (ref 3.5–5.1)
Sodium: 131 mmol/L — ABNORMAL LOW (ref 135–145)
Sodium: 129 mmol/L — ABNORMAL LOW (ref 135–145)
Sodium: 130 mmol/L — ABNORMAL LOW (ref 135–145)

## 2021-09-26 LAB — ETHANOL: Alcohol, Ethyl (B): 209 mg/dL — ABNORMAL HIGH (ref <10)

## 2021-09-26 LAB — URINALYSIS, COMPLETE (UACMP) WITH MICROSCOPIC
Bacteria, UA: NONE SEEN
Bilirubin Urine: NEGATIVE
Glucose, UA: NEGATIVE mg/dL
Ketones, ur: NEGATIVE mg/dL
Leukocytes,Ua: NEGATIVE
Nitrite: NEGATIVE
Protein, ur: NEGATIVE mg/dL
Specific Gravity, Urine: 1.011 (ref 1.005–1.030)
pH: 6 (ref 5.0–8.0)

## 2021-09-26 LAB — COMPREHENSIVE METABOLIC PANEL
ALT: 51 U/L — ABNORMAL HIGH (ref 0–44)
ALT: 48 U/L — ABNORMAL HIGH (ref 0–44)
AST: 123 U/L — ABNORMAL HIGH (ref 15–41)
AST: 103 U/L — ABNORMAL HIGH (ref 15–41)
Albumin: 3.8 g/dL (ref 3.5–5.0)
Albumin: 3.5 g/dL (ref 3.5–5.0)
Alkaline Phosphatase: 69 U/L (ref 38–126)
Alkaline Phosphatase: 62 U/L (ref 38–126)
Anion gap: 18 — ABNORMAL HIGH (ref 5–15)
Anion gap: 12 (ref 5–15)
BUN: 18 mg/dL (ref 6–20)
BUN: 14 mg/dL (ref 6–20)
CO2: 13 mmol/L — ABNORMAL LOW (ref 22–32)
CO2: 13 mmol/L — ABNORMAL LOW (ref 22–32)
Calcium: 8.8 mg/dL — ABNORMAL LOW (ref 8.9–10.3)
Calcium: 8.4 mg/dL — ABNORMAL LOW (ref 8.9–10.3)
Chloride: 98 mmol/L (ref 98–111)
Chloride: 105 mmol/L (ref 98–111)
Creatinine, Ser: 1.64 mg/dL — ABNORMAL HIGH (ref 0.61–1.24)
Creatinine, Ser: 1.15 mg/dL (ref 0.61–1.24)
GFR, Estimated: 48 mL/min — ABNORMAL LOW (ref >60)
GFR, Estimated: >60 mL/min (ref >60)
Glucose, Bld: 59 mg/dL — ABNORMAL LOW (ref 70–99)
Glucose, Bld: 148 mg/dL — ABNORMAL HIGH (ref 70–99)
Potassium: 4.6 mmol/L (ref 3.5–5.1)
Potassium: 4.2 mmol/L (ref 3.5–5.1)
Sodium: 129 mmol/L — ABNORMAL LOW (ref 135–145)
Sodium: 130 mmol/L — ABNORMAL LOW (ref 135–145)
Total Bilirubin: 0.8 mg/dL (ref 0.3–1.2)
Total Bilirubin: 0.4 mg/dL (ref 0.3–1.2)
Total Protein: 6.4 g/dL — ABNORMAL LOW (ref 6.5–8.1)
Total Protein: 5.9 g/dL — ABNORMAL LOW (ref 6.5–8.1)

## 2021-09-26 LAB — BRAIN NATRIURETIC PEPTIDE: B Natriuretic Peptide: 73.7 pg/mL (ref 0.0–100.0)

## 2021-09-26 LAB — TROPONIN I (HIGH SENSITIVITY)
Troponin I (High Sensitivity): 10 ng/L (ref <18)
Troponin I (High Sensitivity): 8 ng/L (ref <18)

## 2021-09-26 LAB — SODIUM, URINE, RANDOM: Sodium, Ur: 107 mmol/L

## 2021-09-26 LAB — BETA-HYDROXYBUTYRIC ACID: Beta-Hydroxybutyric Acid: 0.11 mmol/L (ref 0.05–0.27)

## 2021-09-26 LAB — LACTIC ACID, PLASMA
Lactic Acid, Venous: 5.3 mmol/L (ref 0.5–1.9)
Lactic Acid, Venous: 5.9 mmol/L (ref 0.5–1.9)
Lactic Acid, Venous: 5.7 mmol/L (ref 0.5–1.9)
Lactic Acid, Venous: 4.3 mmol/L (ref 0.5–1.9)
Lactic Acid, Venous: 2.1 mmol/L (ref 0.5–1.9)
Lactic Acid, Venous: 2.5 mmol/L (ref 0.5–1.9)

## 2021-09-26 LAB — I-STAT ARTERIAL BLOOD GAS, ED
Acid-base deficit: 11 mmol/L — ABNORMAL HIGH (ref 0.0–2.0)
Bicarbonate: 14.7 mmol/L — ABNORMAL LOW (ref 20.0–28.0)
Calcium, Ion: 1.19 mmol/L (ref 1.15–1.40)
HCT: 42 % (ref 39.0–52.0)
Hemoglobin: 14.3 g/dL (ref 13.0–17.0)
O2 Saturation: 99 %
Patient temperature: 97.5
Potassium: 4.2 mmol/L (ref 3.5–5.1)
Sodium: 131 mmol/L — ABNORMAL LOW (ref 135–145)
TCO2: 16 mmol/L — ABNORMAL LOW (ref 22–32)
pCO2 arterial: 30.3 mmHg — ABNORMAL LOW (ref 32–48)
pH, Arterial: 7.29 — ABNORMAL LOW (ref 7.35–7.45)
pO2, Arterial: 137 mmHg — ABNORMAL HIGH (ref 83–108)

## 2021-09-26 LAB — PROTIME-INR
INR: 1.1 (ref 0.8–1.2)
Prothrombin Time: 13.8 seconds (ref 11.4–15.2)

## 2021-09-26 LAB — MAGNESIUM: Magnesium: 1.6 mg/dL — ABNORMAL LOW (ref 1.7–2.4)

## 2021-09-26 LAB — OSMOLALITY: Osmolality: 276 mOsm/kg (ref 275–295)

## 2021-09-26 LAB — I-STAT CHEM 8, ED
BUN: 20 mg/dL (ref 6–20)
Calcium, Ion: 0.79 mmol/L — CL (ref 1.15–1.40)
Chloride: 102 mmol/L (ref 98–111)
Creatinine, Ser: 1.8 mg/dL — ABNORMAL HIGH (ref 0.61–1.24)
Glucose, Bld: 53 mg/dL — ABNORMAL LOW (ref 70–99)
HCT: 51 % (ref 39.0–52.0)
Hemoglobin: 17.3 g/dL — ABNORMAL HIGH (ref 13.0–17.0)
Potassium: 4.3 mmol/L (ref 3.5–5.1)
Sodium: 126 mmol/L — ABNORMAL LOW (ref 135–145)
TCO2: 14 mmol/L — ABNORMAL LOW (ref 22–32)

## 2021-09-26 LAB — LIPASE, BLOOD: Lipase: 25 U/L (ref 11–51)

## 2021-09-26 LAB — OSMOLALITY, URINE: Osmolality, Ur: 462 mOsm/kg (ref 300–900)

## 2021-09-26 LAB — HEPATITIS B SURFACE ANTIGEN: Hepatitis B Surface Ag: NONREACTIVE

## 2021-09-26 LAB — FOLATE: Folate: 7.7 ng/mL (ref >5.9)

## 2021-09-26 LAB — PHOSPHORUS: Phosphorus: 3.2 mg/dL (ref 2.5–4.6)

## 2021-09-26 MED ORDER — IPRATROPIUM-ALBUTEROL 0.5-2.5 (3) MG/3ML IN SOLN
3.0000 mL | RESPIRATORY_TRACT | Status: DC
  Administered 2021-09-26 (×2): 3 mL via RESPIRATORY_TRACT
  Filled 2021-09-26 (×2): qty 3

## 2021-09-26 MED ORDER — THIAMINE HCL 100 MG PO TABS
100.0000 mg | ORAL_TABLET | Freq: Every day | ORAL | Status: DC
  Administered 2021-09-26 – 2021-10-03 (×8): 100 mg via ORAL
  Filled 2021-09-26 (×8): qty 1

## 2021-09-26 MED ORDER — ACETAMINOPHEN 325 MG PO TABS
650.0000 mg | ORAL_TABLET | Freq: Four times a day (QID) | ORAL | Status: DC | PRN
  Administered 2021-09-27: 650 mg via ORAL
  Filled 2021-09-26: qty 2

## 2021-09-26 MED ORDER — IPRATROPIUM-ALBUTEROL 0.5-2.5 (3) MG/3ML IN SOLN
3.0000 mL | Freq: Once | RESPIRATORY_TRACT | Status: DC
  Administered 2021-09-26: 3 mL via RESPIRATORY_TRACT
  Filled 2021-09-26: qty 3

## 2021-09-26 MED ORDER — PANTOPRAZOLE SODIUM 40 MG PO TBEC
40.0000 mg | DELAYED_RELEASE_TABLET | Freq: Every day | ORAL | Status: DC
  Administered 2021-09-26 – 2021-10-03 (×8): 40 mg via ORAL
  Filled 2021-09-26 (×8): qty 1

## 2021-09-26 MED ORDER — SODIUM CHLORIDE 0.9 % IV BOLUS (SEPSIS)
1000.0000 mL | Freq: Once | INTRAVENOUS | Status: AC
Start: 2021-09-26 — End: 2021-09-26
  Administered 2021-09-26: 1000 mL via INTRAVENOUS

## 2021-09-26 MED ORDER — HYDROCORTISONE SOD SUC (PF) 100 MG IJ SOLR
100.0000 mg | Freq: Once | INTRAMUSCULAR | Status: DC
  Filled 2021-09-26: qty 2

## 2021-09-26 MED ORDER — ATORVASTATIN CALCIUM 10 MG PO TABS: 20.0000 mg | ORAL_TABLET | Freq: Every day | ORAL | Status: DC

## 2021-09-26 MED ORDER — SODIUM CHLORIDE 0.9 % IV BOLUS
1000.0000 mL | Freq: Once | INTRAVENOUS | Status: AC
  Administered 2021-09-26: 1000 mL via INTRAVENOUS

## 2021-09-26 MED ORDER — IPRATROPIUM-ALBUTEROL 0.5-2.5 (3) MG/3ML IN SOLN: 3.0000 mL | RESPIRATORY_TRACT | Status: DC | PRN

## 2021-09-26 MED ORDER — DEXTROSE IN LACTATED RINGERS 5 % IV SOLN: INTRAVENOUS | Status: DC

## 2021-09-26 MED ORDER — FOLIC ACID 1 MG PO TABS
1.0000 mg | ORAL_TABLET | Freq: Every day | ORAL | Status: DC
  Administered 2021-09-27 – 2021-10-03 (×7): 1 mg via ORAL
  Filled 2021-09-26 (×8): qty 1

## 2021-09-26 MED ORDER — ENOXAPARIN SODIUM 30 MG/0.3ML IJ SOSY
30.0000 mg | PREFILLED_SYRINGE | Freq: Every day | INTRAMUSCULAR | Status: DC
  Administered 2021-09-26 – 2021-09-28 (×3): 30 mg via SUBCUTANEOUS
  Filled 2021-09-26 (×2): qty 0.3

## 2021-09-26 MED ORDER — ACETAMINOPHEN 650 MG RE SUPP: 650.0000 mg | Freq: Four times a day (QID) | RECTAL | Status: DC | PRN

## 2021-09-26 MED ORDER — LORAZEPAM 1 MG PO TABS: 1.0000 mg | ORAL_TABLET | ORAL | Status: DC | PRN

## 2021-09-26 MED ORDER — SODIUM CHLORIDE 0.9 % IV SOLN
500.0000 mg | Freq: Once | INTRAVENOUS | Status: DC
  Filled 2021-09-26: qty 5

## 2021-09-26 MED ORDER — ALBUTEROL SULFATE (2.5 MG/3ML) 0.083% IN NEBU
2.5000 mg | INHALATION_SOLUTION | Freq: Four times a day (QID) | RESPIRATORY_TRACT | Status: DC | PRN
  Administered 2021-09-27 – 2021-10-02 (×4): 2.5 mg via RESPIRATORY_TRACT
  Filled 2021-09-26 (×4): qty 3

## 2021-09-26 MED ORDER — ADULT MULTIVITAMIN W/MINERALS CH
1.0000 | ORAL_TABLET | Freq: Every day | ORAL | Status: DC
  Administered 2021-09-27 – 2021-10-03 (×7): 1 via ORAL
  Filled 2021-09-26 (×8): qty 1

## 2021-09-26 MED ORDER — IPRATROPIUM-ALBUTEROL 0.5-2.5 (3) MG/3ML IN SOLN
3.0000 mL | RESPIRATORY_TRACT | Status: DC
  Administered 2021-09-26: 3 mL via RESPIRATORY_TRACT
  Filled 2021-09-26: qty 3

## 2021-09-26 MED ORDER — IPRATROPIUM-ALBUTEROL 0.5-2.5 (3) MG/3ML IN SOLN: 3.0000 mL | RESPIRATORY_TRACT | Status: DC

## 2021-09-26 MED ORDER — MAGNESIUM SULFATE 2 GM/50ML IV SOLN
2.0000 g | Freq: Once | INTRAVENOUS | Status: AC
  Administered 2021-09-26: 2 g via INTRAVENOUS
  Filled 2021-09-26: qty 50

## 2021-09-26 MED ORDER — SODIUM CHLORIDE 0.9 % IV SOLN: 1000.0000 mL | INTRAVENOUS | Status: DC

## 2021-09-26 MED ORDER — METHYLPREDNISOLONE SODIUM SUCC 40 MG IJ SOLR: 40.0000 mg | Freq: Every day | INTRAMUSCULAR | Status: DC

## 2021-09-26 MED ORDER — PREDNISONE 50 MG PO TABS
50.0000 mg | ORAL_TABLET | Freq: Every day | ORAL | Status: DC
  Administered 2021-09-27: 50 mg via ORAL
  Filled 2021-09-26: qty 1

## 2021-09-26 MED ORDER — AZITHROMYCIN 250 MG PO TABS
500.0000 mg | ORAL_TABLET | Freq: Once | ORAL | Status: AC
  Administered 2021-09-26: 500 mg via ORAL
  Filled 2021-09-26: qty 2

## 2021-09-26 MED ORDER — UMECLIDINIUM BROMIDE 62.5 MCG/ACT IN AEPB
1.0000 | INHALATION_SPRAY | Freq: Every day | RESPIRATORY_TRACT | Status: DC
  Administered 2021-09-26 – 2021-10-03 (×8): 1 via RESPIRATORY_TRACT
  Filled 2021-09-26 (×3): qty 7

## 2021-09-26 MED ORDER — DEXTROSE 5 % IV SOLN: INTRAVENOUS | Status: DC

## 2021-09-26 MED ORDER — GADOBUTROL 1 MMOL/ML IV SOLN
5.0000 mL | Freq: Once | INTRAVENOUS | Status: AC | PRN
  Administered 2021-09-26: 5 mL via INTRAVENOUS

## 2021-09-26 MED ORDER — AZITHROMYCIN 250 MG PO TABS
250.0000 mg | ORAL_TABLET | Freq: Every day | ORAL | Status: DC
  Administered 2021-09-27 – 2021-09-28 (×2): 250 mg via ORAL
  Filled 2021-09-26 (×2): qty 1

## 2021-09-26 MED ORDER — THIAMINE HCL 100 MG/ML IJ SOLN
100.0000 mg | Freq: Once | INTRAMUSCULAR | Status: AC
  Administered 2021-09-26: 100 mg via INTRAVENOUS
  Filled 2021-09-26: qty 2

## 2021-09-26 MED ORDER — LORAZEPAM 2 MG/ML IJ SOLN: 1.0000 mg | INTRAMUSCULAR | Status: DC | PRN

## 2021-09-26 MED ORDER — METHYLPREDNISOLONE SODIUM SUCC 40 MG IJ SOLR
40.0000 mg | Freq: Once | INTRAMUSCULAR | Status: AC
  Administered 2021-09-26: 40 mg via INTRAVENOUS
  Filled 2021-09-26: qty 1

## 2021-09-26 MED ORDER — FLUTICASONE FUROATE-VILANTEROL 100-25 MCG/ACT IN AEPB
1.0000 | INHALATION_SPRAY | Freq: Every day | RESPIRATORY_TRACT | Status: DC
  Administered 2021-09-26 – 2021-10-03 (×8): 1 via RESPIRATORY_TRACT
  Filled 2021-09-26 (×2): qty 28

## 2021-09-26 NOTE — ED Notes (Signed)
Multiple staff members have attempted to obtain secondary iv access as well as straight sticking pt with no success after first access was obtained. ?

## 2021-09-26 NOTE — Evaluation (Signed)
Physical Therapy Evaluation ?Patient Details ?Name: Jaime Cline ?MRN: [id number] ?DOB: 1961/03/17 ?Today's Date: 09/26/2021 ? ?History of Present Illness ? Pt is a 61 y/o male admitted secondary to syncopal episode. PMH includes COPD, pulmonary HTN, alcohol abuse, and HTN.  ?Clinical Impression ? Pt admitted secondary to problem above with deficits below. Mild shakiness and unsteadiness noted with mobility tasks. Educated about using RW initially to help with unsteadiness. Reports sister in law is available to help at d/c. Recommend HHPT to address current deficits. Will continue to follow acutely.  ?   ? ?Recommendations for follow up therapy are one component of a multi-disciplinary discharge planning process, led by the attending physician.  Recommendations may be updated based on patient status, additional functional criteria and insurance authorization. ? ?Follow Up Recommendations Home health PT ? ?  ?Assistance Recommended at Discharge Intermittent Supervision/Assistance  ?Patient can return home with the following ? A little help with bathing/dressing/bathroom;Assistance with cooking/housework;Help with stairs or ramp for entrance;Assist for transportation ? ?  ?Equipment Recommendations Rolling walker (2 wheels)  ?Recommendations for Other Services ?    ?  ?Functional Status Assessment Patient has had a recent decline in their functional status and demonstrates the ability to make significant improvements in function in a reasonable and predictable amount of time.  ? ?  ?Precautions / Restrictions Precautions ?Precautions: Fall ?Precaution Comments: CIWA ?Restrictions ?Weight Bearing Restrictions: No  ? ?  ? ?Mobility ? Bed Mobility ?  ?  ?  ?  ?  ?  ?  ?General bed mobility comments: Sitting EOB upon entry ?  ? ?Transfers ?Overall transfer level: Needs assistance ?Equipment used: 1 person hand held assist ?Transfers: Sit to/from Stand ?Sit to Stand: Min assist ?  ?  ?  ?  ?  ?General transfer comment: Min A  for steadying assist. Mild shakiness noted. BP stable. ?  ? ?Ambulation/Gait ?Ambulation/Gait assistance: Min assist ?Gait Distance (Feet): 10 Feet ?Assistive device: 1 person hand held assist ?Gait Pattern/deviations: Step-through pattern, Decreased stride length ?Gait velocity: Decreased ?  ?  ?General Gait Details: Min A for steadying. Mild shakiness noted throughout. Increased unsteadiness noted with backward walking. Educated about using RW At home to increase steadiness ? ?Stairs ?  ?  ?  ?  ?  ? ?Wheelchair Mobility ?  ? ?Modified Rankin (Stroke Patients Only) ?  ? ?  ? ?Balance Overall balance assessment: Needs assistance ?Sitting-balance support: No upper extremity supported, Feet supported ?Sitting balance-Leahy Scale: Good ?  ?  ?Standing balance support: Single extremity supported ?Standing balance-Leahy Scale: Poor ?Standing balance comment: reliant on at least one UE support ?  ?  ?  ?  ?  ?  ?  ?  ?  ?  ?  ?   ? ? ? ?Pertinent Vitals/Pain Pain Assessment ?Pain Assessment: No/denies pain  ? ? ?Home Living Family/patient expects to be discharged to:: Private residence ?Living Arrangements: Non-relatives/Friends (sister in law) ?Available Help at Discharge: Friend(s) ?Type of Home: Apartment ?Home Access: Stairs to enter ?Entrance Stairs-Rails: Right;Left ?Entrance Stairs-Number of Steps: 6-7 ?  ?Home Layout: One level ?Home Equipment: None ?   ?  ?Prior Function Prior Level of Function : Needs assist ?  ?  ?  ?  ?  ?  ?Mobility Comments: Reports independence ?ADLs Comments: Reports sister in law has been helping him with bathing and IADL tasks ?  ? ? ?Hand Dominance  ?   ? ?  ?Extremity/Trunk Assessment  ?  Upper Extremity Assessment ?Upper Extremity Assessment: Defer to OT evaluation ?  ? ?Lower Extremity Assessment ?Lower Extremity Assessment: Generalized weakness (shakiness noted) ?  ? ?Cervical / Trunk Assessment ?Cervical / Trunk Assessment: Kyphotic  ?Communication  ? Communication: No difficulties   ?Cognition Arousal/Alertness: Awake/alert ?Behavior During Therapy: [facility] for tasks assessed/performed ?Overall Cognitive Status: No family/caregiver present to determine baseline cognitive functioning ?  ?  ?  ?  ?  ?  ?  ?  ?  ?  ?  ?  ?  ?  ?  ?  ?  ?  ?  ? ?  ?General Comments   ? ?  ?Exercises    ? ?Assessment/Plan  ?  ?PT Assessment Patient needs continued PT services  ?PT Problem List Decreased strength;Decreased activity tolerance;Decreased balance;Decreased mobility;Decreased safety awareness ? ?   ?  ?PT Treatment Interventions DME instruction;Gait training;Stair training;Functional mobility training;Therapeutic activities;Therapeutic exercise;Balance training;Patient/family education   ? ?PT Goals (Current goals can be found in the Care Plan section)  ?Acute Rehab PT Goals ?Patient Stated Goal: to go home ?PT Goal Formulation: With patient ?Time For Goal Achievement: 10/10/21 ?Potential to Achieve Goals: Good ? ?  ?Frequency Min 3X/week ?  ? ? ?Co-evaluation   ?  ?  ?  ?  ? ? ?  ?AM-PAC PT "6 Clicks" Mobility  ?Outcome Measure Help needed turning from your back to your side while in a flat bed without using bedrails?: None ?Help needed moving from lying on your back to sitting on the side of a flat bed without using bedrails?: A Little ?Help needed moving to and from a bed to a chair (including a wheelchair)?: A Little ?Help needed standing up from a chair using your arms (e.g., wheelchair or bedside chair)?: A Little ?Help needed to walk in hospital room?: A Little ?Help needed climbing 3-5 steps with a railing? : A Little ?6 Click Score: 19 ? ?  ?End of Session   ?Activity Tolerance: Patient tolerated treatment well ?Patient left: in bed;with call bell/phone within reach (on stretcher in ED) ?Nurse Communication: Mobility status ?PT Visit Diagnosis: Unsteadiness on feet (R26.81);Muscle weakness (generalized) (M62.81) ?  ? ?Time: [id number] ?PT Time Calculation (min) (ACUTE ONLY): 10 min ? ? ?Charges:   PT  Evaluation ?$PT Eval Low Complexity: 1 Low ?  ?  ?   ? ? ?Reuel Derby, PT, DPT  ?Acute Rehabilitation Services  ?Pager: [phone] ?Office: [phone] ? ? ?Minor ?09/26/2021, 4:20 PM ? ?

## 2021-09-26 NOTE — Evaluation (Signed)
Clinical/Bedside Swallow Evaluation ?Patient Details  ?Name: Jaime Cline ?MRN: [id number] ?Date of Birth: 1961/03/20 ? ?Today's Date: 09/26/2021 ?Time: SLP Start Time (ACUTE ONLY): 1005 SLP Stop Time (ACUTE ONLY): 1016 ?SLP Time Calculation (min) (ACUTE ONLY): 11 min ? ?Past Medical History:  ?Past Medical History:  ?Diagnosis Date  ? Alcohol abuse   ? Asthma   ? Bronchitis   ? Chronic back pain   ? Chronic knee pain   ? Hypertension   ? ?Past Surgical History: History reviewed. No pertinent surgical history. ?HPI:  ?[name] is a 61 year old male who presents to the ED after a syncopal episode with unwitnessed fall and loss of consciousness. Patient was recently hospitalized in early March for COPD exacerbation.  He reports since that time that he has had increased whitish sputum production and worsening shortness of breath. Patient also states that over the last 2 days he has had a poor appetite which is primarily because of his persistent coughing. Head CT 4/10 with no acute findings.  CXR 4/10: "Hyperinflation with emphysema."   Pt with a past medical history of COPD complicated by pulmonary hypertension and right heart dysfunction, hypertension, and gastritis.  ?  ?Assessment / Plan / Recommendation  ?Clinical Impression ? Pt presents with seemingly functional pharyngeal swallowing, but clinical indicators and subjective report of a possible esophageal dysphagia.  There was a significantly delayed cough following trials of thin liquid.  Pt stated he felt it was the water that made him cough.  Pt's description of symptoms include scratching feeling in throat with anything he swallows. He intermittently required double swallow and grimacing was noted during PO trials.  With solid cracker there was prolonged but effective oral phase 2/2 edentulism.  Pt independently used liquid wash to assist with clearance. Pt took pills whole in applesauce and with liquid wash without overt s/s of aspiration with either  method.  Pt reports poor appetite recently and appears thin.  Consider RD consult.  Briefly reviewed swallowing strategies for COPD/respiratory compromise.  SLP to follow in house for diet tolerance and education regarding swallow precautions.   ? ?Recommend continuing regular texture diet with thin liquid.  Consider further assessment of esophageal dysphagia/dysmotility. ? ?SLP Visit Diagnosis: Dysphagia, unspecified (R13.10) ?   ?Aspiration Risk ? Mild aspiration risk  ?  ?Diet Recommendation Regular;Thin liquid  ? ?Liquid Administration via: Cup;Straw ?Medication Administration: Whole meds with liquid (as tolerated) ?Supervision: Patient able to self feed ?Compensations: Slow rate;Small sips/bites ?Postural Changes: Seated upright at 90 degrees  ?  ?Other  Recommendations Recommended Consults: Consider esophageal assessment ?Oral Care Recommendations: Oral care BID   ? ?Recommendations for follow up therapy are one component of a multi-disciplinary discharge planning process, led by the attending physician.  Recommendations may be updated based on patient status, additional functional criteria and insurance authorization. ? ?Follow up Recommendations Outpatient SLP (at present - pt may not require SLP follow up depending on how clinical presentation progresses)  ? ? ?  ?Assistance Recommended at Discharge None  ?Functional Status Assessment Patient has had a recent decline in their functional status and demonstrates the ability to make significant improvements in function in a reasonable and predictable amount of time.  ?Frequency and Duration min 2x/week  ?2 weeks ?  ?   ? ?Prognosis Prognosis for Safe Diet Advancement: Good  ? ?  ? ?Swallow Study   ?General Date of Onset: 09/25/21 ?HPI: [name] is a 61 year old male who presents to the ED  after a syncopal episode with unwitnessed fall and loss of consciousness. Patient was recently hospitalized in early March for COPD exacerbation.  He reports since that  time that he has had increased whitish sputum production and worsening shortness of breath. Patient also states that over the last 2 days he has had a poor appetite which is primarily because of his persistent coughing. Head CT 4/10 with no acute findings.  CXR 4/10: "Hyperinflation with emphysema."   Pt with a past medical history of COPD complicated by pulmonary hypertension and right heart dysfunction, hypertension, and gastritis. ?Previous Swallow Assessment: None ?Diet Prior to this Study: Regular;Thin liquids ?Temperature Spikes Noted: No ?Respiratory Status: Nasal cannula ?History of Recent Intubation: No ?Behavior/Cognition: Alert;Cooperative;Pleasant mood ?Oral Cavity Assessment: Within Functional Limits ?Oral Care Completed by SLP: No ?Oral Cavity - Dentition: Edentulous ?Vision: Functional for self-feeding ?Self-Feeding Abilities: Able to feed self ?Patient Positioning: Upright in bed ?Baseline Vocal Quality: Normal ?Volitional Cough: Strong ?Volitional Swallow: Able to elicit  ?  ?Oral/Motor/Sensory Function Overall Oral Motor/Sensory Function: Within functional limits (?tremor) ?Facial ROM: Within Functional Limits ?Facial Symmetry: Within Functional Limits ?Lingual ROM: Within Functional Limits ?Lingual Symmetry: Within Functional Limits ?Lingual Strength: Within Functional Limits ?Velum: Within Functional Limits ?Mandible: Within Functional Limits   ?Ice Chips Ice chips: Not tested   ?Thin Liquid Thin Liquid: Impaired ?Pharyngeal  Phase Impairments: Cough - Delayed  ?  ?Nectar Thick Nectar Thick Liquid: Not tested   ?Honey Thick Honey Thick Liquid: Not tested   ?Puree Puree: Within functional limits   ?Solid ? ? ?  Solid: Impaired ?Oral Phase Functional Implications: Prolonged oral transit  ? ?  ? ?Renae Mottley E Lenda Baratta, MA, CCC-SLP ?Acute Rehabilitation Services ?Office: [phone] ?09/26/2021,12:11 PM ? ? ? ? ?

## 2021-09-26 NOTE — ED Provider Notes (Signed)
?[facility] ?Provider Note ? ?CSN: [id number] ?Arrival date & time: 09/25/21 2356 ? ?Chief Complaint(s) ?Fall ? ?HPI ?Jaime Cline is a 61 y.o. male with a past medical history listed below including alcohol use disorder, COPD who presents to the emergency department for syncopal episode at home.  He was brought in by family members.  During triage she was noted to be hypotensive with systolics in the 60s.  Brought back immediately.  ? ?Patient denies any chest pain or shortness of breath.  He endorses intermittent nausea or vomiting.  No diarrhea.  Currently no abdominal pain. ? ?Reports that he got up to go to the bathroom and passed out. Unsure of head trauma, but reports mild headache. ? ?Denies any neck pain, back pain or extremity pain. ? ?The history is provided by the patient.  ? ?Past Medical History ?Past Medical History:  ?Diagnosis Date  ? Alcohol abuse   ? Asthma   ? Bronchitis   ? Chronic back pain   ? Chronic knee pain   ? Hypertension   ? ?Patient Active Problem List  ? Diagnosis Date Noted  ? Community acquired pneumonia of right lower lobe of lung   ? COPD exacerbation (HCC) 08/27/2021  ? Asthma, chronic 12/31/2012  ? Smoker 12/31/2012  ? Knee pain, bilateral 12/31/2012  ? Iron deficiency anemia 12/31/2012  ? Knee pain, chronic 12/31/2012  ? ?Home Medication(s) ?Prior to Admission medications   ?Medication Sig Start Date End Date Taking? Authorizing Provider  ?albuterol (VENTOLIN HFA) 108 (90 Base) MCG/ACT inhaler Inhale 2 puffs into the lungs every 4 (four) hours as needed for wheezing or shortness of breath. 08/29/21   Champ Mungoean, Emily, DO  ?atorvastatin (LIPITOR) 20 MG tablet Take 1 tablet (20 mg total) by mouth daily. 08/29/21   Champ Mungoean, Emily, DO  ?famotidine (PEPCID) 20 MG tablet Take 20 mg by mouth 2 (two) times daily.    [provider]  ?Fluticasone-Salmeterol (ADVAIR) 100-50 MCG/DOSE AEPB Inhale 1 puff into the lungs 2 (two) times daily. 08/29/21   Champ Mungoean,  Emily, DO  ?lisinopril (ZESTRIL) 20 MG tablet Take 1 tablet (20 mg total) by mouth daily. 08/29/21   Champ Mungoean, Emily, DO  ?sertraline (ZOLOFT) 50 MG tablet Take 50 mg by mouth daily.    [provider]  ?albuterol (PROVENTIL,VENTOLIN) 90 MCG/ACT inhaler Inhale 2 puffs into the lungs every 6 (six) hours as needed. Shortness of breath and wheezing   03/22/12  [provider]  ?                                                                                                                                  ?Allergies ?Other ? ?Review of Systems ?Review of Systems ?As noted in HPI ? ?Physical Exam ?Vital Signs  ?I have reviewed the triage vital signs ?BP (!) 68/40   Pulse 104  Temp (!) 97.5 ?F (36.4 ?C)  Resp 11   Ht 6' (1.829 m)   Wt 43.9 kg   SpO2 94%   BMI 13.13 kg/m?  ? ?Physical Exam ?Vitals reviewed.  ?Constitutional:   ?   General: He is not in acute distress. ?   Appearance: He is well-developed. He is cachectic. He is ill-appearing. He is not diaphoretic.  ?HENT:  ?   Head: Normocephalic and atraumatic.  ?   Nose: Nose normal.  ?Eyes:  ?   General: No scleral icterus.    ?   Right eye: No discharge.     ?   Left eye: No discharge.  ?   Conjunctiva/sclera: Conjunctivae normal.  ?   Pupils: Pupils are equal, round, and reactive to light.  ?Cardiovascular:  ?   Rate and Rhythm: Regular rhythm. Tachycardia present.  ?   Heart sounds: No murmur heard. ?  No friction rub. No gallop.  ?Pulmonary:  ?   Effort: Pulmonary effort is normal. No respiratory distress.  ?   Breath sounds: Normal breath sounds. No stridor. No wheezing, rhonchi or rales.  ?Abdominal:  ?   General: There is no distension.  ?   Palpations: Abdomen is soft.  ?   Tenderness: There is no abdominal tenderness.  ?Musculoskeletal:     ?   General: No tenderness.  ?   Cervical back: Normal range of motion and neck supple.  ?Skin: ?   General: Skin is warm and dry.  ?   Findings: No erythema or rash.  ?Neurological:  ?   Mental Status:  He is alert and oriented to person, place, and time.  ? ? ?ED Results and Treatments ?Labs ?(all labs ordered are listed, but only abnormal results are displayed) ?Labs Reviewed  ?ETHANOL - Abnormal; Notable for the following components:  ?    Result Value  ? Alcohol, Ethyl (B) 209 (*)   ? All other components within normal limits  ?LACTIC ACID, PLASMA - Abnormal; Notable for the following components:  ? Lactic Acid, Venous 5.3 (*)   ? All other components within normal limits  ?CBC WITH DIFFERENTIAL/PLATELET - Abnormal; Notable for the following components:  ? MCH 34.1 (*)   ? All other components within normal limits  ?I-STAT CHEM 8, ED - Abnormal; Notable for the following components:  ? Sodium 126 (*)   ? Creatinine, Ser 1.80 (*)   ? Glucose, Bld 53 (*)   ? Calcium, Ion 0.79 (*)   ? TCO2 14 (*)   ? Hemoglobin 17.3 (*)   ? All other components within normal limits  ?COMPREHENSIVE METABOLIC PANEL  ?LIPASE, BLOOD  ?BRAIN NATRIURETIC PEPTIDE  ?LACTIC ACID, PLASMA  ?CBG MONITORING, ED  ?TROPONIN I (HIGH SENSITIVITY)  ?                                                                                                                       ?EKG ? EKG Interpretation ? ?Date/Time:  Monday September 26 2021 00:14:10 EDT ?  Ventricular Rate:  99 ?PR Interval:  137 ?QRS Duration: 81 ?QT Interval:  327 ?QTC Calculation: 420 ?R Axis:   95 ?Text Interpretation: Sinus rhythm Right atrial enlargement Right axis deviation RSR' in V1 or V2, probably normal variant Probable anterolateral infarct, old No significant change was found Confirmed by Drema Pry [id number]) on 09/26/2021 12:49:52 AM ?  ? ?  ? ?Radiology ?DG Chest Port 1 View ? ?Result Date: 09/26/2021 ?CLINICAL DATA:  Post fall EXAM: PORTABLE CHEST 1 VIEW COMPARISON:  08/27/2021 chest x-ray and CT FINDINGS: Hyperinflation with emphysema. No focal airspace disease, pleural effusion, or pneumothorax. Stable cardiomediastinal silhouette with atherosclerosis. IMPRESSION: Hyperinflation  with emphysema Electronically Signed   By: Jasmine Pang M.D.   On: 09/26/2021 00:56   ? ?Pertinent labs & imaging results that were available during my care of the patient were reviewed by me and considered in my medical decision making (see MDM for details). ? ?Medications Ordered in ED ?Medications  ?sodium chloride 0.9 % bolus 1,000 mL (has no administration in time range)  ?  Followed by  ?0.9 %  sodium chloride infusion (has no administration in time range)  ?dextrose 5 % solution ( Intravenous New Bag/Given 09/26/21 0131)  ?ipratropium-albuterol (DUONEB) 0.5-2.5 (3) MG/3ML nebulizer solution 3 mL (has no administration in time range)  ?hydrocortisone sodium succinate (SOLU-CORTEF) 100 MG injection 100 mg (has no administration in time range)  ?sodium chloride 0.9 % bolus 1,000 mL (0 mLs Intravenous Stopped 09/26/21 0313)  ?thiamine (B-1) injection 100 mg (100 mg Intravenous Given 09/26/21 0139)  ?                                                               ?                                                                    ?Procedures ?Marland KitchenCritical Care ?Performed by: Nira Conn, MD ?Authorized by: Nira Conn, MD  ? ?Critical care provider statement:  ?  Critical care time (minutes):  75 ?  Critical care time was exclusive of:  Separately billable procedures and treating other patients ?  Critical care was necessary to treat or prevent imminent or life-threatening deterioration of the following conditions:  Circulatory failure, dehydration and metabolic crisis ?  Critical care was time spent personally by me on the following activities:  Development of treatment plan with patient or surrogate, discussions with consultants, evaluation of patient's response to treatment, examination of patient, obtaining history from patient or surrogate, review of old charts, re-evaluation of patient's condition, pulse oximetry, ordering and review of radiographic studies, ordering and review of laboratory  studies and ordering and performing treatments and interventions ?  Care discussed with: admitting provider   ? ?(including critical care time) ? ?Medical Decision Making / ED Course ? ? ? Complexity of P

## 2021-09-26 NOTE — H&P (Addendum)
? ? ? ?Date: 09/26/2021     ?     ?     ?Patient Name:  Jaime Cline MRN: [id number]  ?DOB: December 11, 1960 Age / Sex: 61 y.o., male   ?PCP: Default, Provider, MD    ?     ?Medical Service: Internal Medicine Teaching Service    ?     ?Attending Physician: Dr. Dickie La, MD    ?First Contact: Dr. Adron Bene, MD Pager: [phone]  ?Second Contact: Dr. Thalia Bloodgood, DO Pager: [phone]  ?     ?After Hours (After 5p/  First Contact Pager: [phone]  ?weekends / holidays): Second Contact Pager: [phone]  ? ?Chief Complaint: Syncope ? ?History of Present Illness:  ? ?[name] is a 61 year old male with a past medical history of COPD complicated by pulmonary hypertension and right heart dysfunction, hypertension, and gastritis who presents to the ED after a syncopal episode. ? ?Patient was recently hospitalized in early March for COPD exacerbation.  He reports since that time that he has had increased whitish sputum production and worsening shortness of breath.  He notes that he has required the use of his rescue inhaler approximately 5 times daily.  Patient reports that he was sitting down on the night of admission, he noted significant wheezing and shortness of breath.  When he stood up and went to the restroom he states that he became dizzy and subsequently lost consciousness.  Patient's fall was unwitnessed, however his sister-in-law with whom he lives with heard his fall and came to his attention.  He did hit his head but had no confusion when he regained consciousness.  He also noticed some palpitations when he was really short of breath.  Patient also states that over the last 2 days he has had a poor appetite which is primarily because of his persistent coughing.  He also had associated subjective fever.  He denied any loss of bowel or bladder function.  He also denied any tongue biting. ? ?Patient denies any nausea or vomiting.  He also denies constipation.  He does note that he has about 2-3 loose stools  daily states that this has been ongoing for at least the past 2 months. ? ?Of note patient states that he ran out of his Spiriva about a week ago in addition to his Advair. ? ?When evaluated at bedside patient states that he continues to feel short of breath but it is much improved from his shortness of breath that preceded his syncopal event. ? ? ?Meds:  ?Albuterol ?Advair ?Spiriva ?Atorvastatin 20 mg ?Pepcid 20 mg ?Lisinopril 20 mg ?Zoloft 20 mg ? ? ?Allergies: ?Allergies as of 09/25/2021 - Review Complete 08/27/2021  ?Allergen Reaction Noted  ? Other Other (See Comments) 09/06/2013  ? ?Past Medical History:  ?Diagnosis Date  ? Alcohol abuse   ? Asthma   ? Bronchitis   ? Chronic back pain   ? Chronic knee pain   ? Hypertension   ? ? ?Family History:  ?Family History  ?Problem Relation Age of Onset  ? Cancer Mother   ? Cancer Sister   ? ? ? ?Social History:  ?Half a pack per day cigarette use for 20 to 30 years. ?Drinks a 24 ounce of beer daily.  Last drink was on the day of admission. ?Lives with his sister-in-law in [place].  Has a son who lives in [place]. ? ?Review of Systems: ?A complete ROS was negative except as per HPI.  ? ?Physical Exam: ?  Blood pressure 98/67, pulse 86, temperature (!) 97.5 ?F (36.4 ?C), resp. rate (!) 21, height 6' (1.829 m), weight 43.9 kg, SpO2 100 %. ? ?Constitutional: Cachectic appearing and in no distress.  ?HENT:  ?Head: Bitemporal wasting and atraumatic.  ?Eyes: EOM are normal.  ?Neck: Normal range of motion.  ?Cardiovascular: Normal rate, regular rhythm, intact distal pulses. No gallop and no friction rub.  ?No murmur heard. No lower extremity edema  ?Pulmonary: Non labored breathing on 5 L nasal cannula, no wheezing or rales however poor air movement throughout lung fields ?Abdominal: Soft. Normal bowel sounds. Non distended and non tender ?Musculoskeletal: Normal range of motion.     ?   General: No tenderness or edema.  ?Neurological: Alert and oriented to person, place,  and time. Non focal  ?Skin: Skin is warm and dry.  ? ? ?EKG: personally reviewed my interpretation is normal sinus rhythm with findings consistent with right atrial enlargement as well as right axis deviation ? ?CXR: personally reviewed my interpretation is no acute cardiopulmonary findings ? ?Assessment & Plan by Problem: ?Principal Problem: ?  Syncope ? ?#Syncope ?#Hypovolemia ?#Lactic acidosis  ?Unclear if patient had true syncope or impaired consciousness.  Patient with multiple reasons for both.  Patient states that he has not eaten well over the past 2 days and has continued to take his antihypertensive medication.  On admission his systolic blood pressure was noted to be in the 60s.  Patient also noted that he was lightheaded and dizzy when he stood up to ambulate prior to losing consciousness.  This is suggestive of orthostatic hypotension in the setting of hypovolemia and continue oral antihypertensive use.  Patient was also noted to have a significantly elevated ethanol level on admission.  His acute alcohol intoxication could also be contributing to his symptoms.  Patient also was hypoglycemic on admission in the 50s.  Finally, patient was noted to have poor air movement on exam as well as oxygen saturations and the low 80s.  His hypoglycemia and hypoxemia could have led to the syncopal episode or a syncopal-like episode. ? ?Patient CT head was negative for bleed and patient sustained no further injuries from his fall. ? ?Plan: ?Continue IV fluids ?Set of vitals not obtained on admission, will obtain these in the a.m. ?CIWA protocol for alcohol withdrawal ?Oral steroids, home inhalers, scheduled DuoNebs for COPD ?Consult nutrition for severe malnutrition ? ?#COPD exacerbation  ?Patient reports that since being discharged from the hospital in mid March he has had increasing cough productive of whitish sputum with increasing amounts of sputum production.  In addition, patient has noticed that his shortness  of breath has worsened.  He states that he has required the use of his rescue inhaler about 5 times daily since being discharged.  He states that he ran out of his Advair as well as his Spiriva.  He is also has some subjective fevers.  Of note, patient does continue to smoke.  ? ?Patient's worsening symptoms likely result of COPD exacerbation in the setting of not continuing his rescue inhalers as well as continuing to smoke cigarettes. ? ?Patient reports shortness of breath improved since being admitted to the hospital. ? ?Plan:  ?Patient is status post one-time dose of hydrocortisone, we will continue p.o. steroids ?We will resume home inhalers ?We will schedule DuoNebs for 5 doses ? ?#Non severe Alcoholic hepatitis ?Patient with >2:1 AST/ALT, elevated ETOH. PT/INR pending and tbili WNL. Continue to counsel patient on alcohol cessation will consult TOC  this hospitalization.  ?-Will obtain Abd US ? ? ?#AKI ?This is likely in the setting of poor oral intake as well as continue use of his lisinopril. ?-Continue with IV fluids ?-Avoid nephrotoxic agents ? ?#Alcohol Use disorder  ?Patient states he drinks at least a 24 oz beer daily. Markedly elevated ETOH levels on admission and noted to have >2:1 AST/ALT pattern of hepatocellular injury.  ?-TOC c/s for resources on cessation  ? ?#Hypertension ?Patient was hypotensive on admission.  This was in the setting of hypovolemia and continue antihypertensive use. ?-Hold home antihypertensives ? ?#Severe protein malnutrition ?Patient cachetic appearing with bitemporal wasting. He has severe AUD and COPD.  ?-Will consult nutrition this hospitalizaiton.  ? ? ?Dispo: Admit patient to Observation with expected length of stay less than 2 midnights. ? ?Signed: ?Marolyn Hallerarter, Avonelle Viveros, MD ?09/26/2021, 4:57 AM  ?After 5pm on weekdays and 1pm on weekends: On Call pager: [phone] ? ?

## 2021-09-26 NOTE — Progress Notes (Signed)
PIV assessed and flushed no indication for 2nd IV at this time. Pt's nurse informed. ?

## 2021-09-26 NOTE — Progress Notes (Addendum)
? ? ?HD#0 ?SUBJECTIVE:  ?Patient Summary: Jaime Cline is a 61 y.o. with a pertinent PMH of COPD and alcohol use disorder, who presented with syncope and admitted for COPD exacerbation.  ? ?Overnight Events: NAEO ? ?  ?Interm History: Patient seen and evaluated at bedside. States he is here due to trouble breathing relating to his history of COPD. Endorsing coughing up whit sputum. He states he drink alcohol infrequently, only 1 beer a couple times a week, but does state his last drink was yesterday. He denies withdrawing from alcohol before. Does have the shakes. Complains of trouble swallowing food.  ? ?OBJECTIVE:  ?Vital Signs: ?Vitals:  ? 09/26/21 1114 09/26/21 1145 09/26/21 1357 09/26/21 1456  ?BP:  126/81 131/85 123/83  ?Pulse: 91 93 89 82  ?Resp: 19 18  20   ?Temp:    98.6 ?F (37 ?C)  ?TempSrc:    Oral  ?SpO2: 100% 100%  100%  ?Weight:      ?Height:      ? ?Supplemental O2:  ?SpO2: 100 % ? ?Filed Weights  ? 09/26/21 0004  ?Weight: 43.9 kg  ? ? ? ?Intake/Output Summary (Last 24 hours) at 09/26/2021 1617 ?Last data filed at 09/26/2021 1208 ?Gross per 24 hour  ?Intake 1600 ml  ?Output --  ?Net 1600 ml  ? ?Net IO Since Admission: 1,600 mL [09/26/21 1617] ? ?Physical Exam: ?Constitutional: Cachectic appearing and in no distress. tremulous ?HENT:  ?Head: Bitemporal wasting and atraumatic. Tender to palpation over left forehead ?Eyes: EOM are normal.  ?Neck: Normal range of motion.  ?Cardiovascular: Normal rate, regular rhythm, intact distal pulses. No gallop and no friction rub.  ?No murmur heard. No lower extremity edema  ?Pulmonary: Non labored breathing on 5 L nasal cannula, no wheezing or rales however poor air movement throughout lung fields ?Abdominal: Soft. Normal bowel sounds. Non distended and non tender ?Musculoskeletal: Normal range of motion.     ?   General: No tenderness or edema.  ?Neurological: Alert and oriented to person, place, and time. No dysmetria and no dysdiadochokinesis. Tremulous. Strength  upper extremities intact. Strength BLE 3/5. ?Skin: Skin is warm and dry. Poor turgor ? ?Patient Lines/Drains/Airways Status   ? ? Active Line/Drains/Airways   ? ? Name Placement date Placement time Site Days  ? Peripheral IV 08/27/21 24 G Anterior;Proximal;Right Forearm 08/27/21  0847  Forearm  30  ? Peripheral IV 09/26/21 20 G Anterior;Left Forearm 09/26/21  --  Forearm  less than 1  ? Peripheral IV 09/26/21 20 G 1.16" Anterior;Right;Upper Arm 09/26/21  0650  Arm  less than 1  ? ?  ?  ? ?  ? ? ?Pertinent Labs: ? ?  Latest Ref Rng & Units 09/26/2021  ?  5:20 AM 09/26/2021  ? 12:51 AM 09/26/2021  ? 12:49 AM  ?CBC  ?WBC 4.0 - 10.5 K/uL  7.2     ?Hemoglobin 13.0 - 17.0 g/dL 11/26/2021   73.5   32.9    ?Hematocrit 39.0 - 52.0 % 42.0   43.5   51.0    ?Platelets 150 - 400 K/uL  184     ? ? ? ?  Latest Ref Rng & Units 09/26/2021  ? 12:09 PM 09/26/2021  ?  5:22 AM 09/26/2021  ?  5:20 AM  ?CMP  ?Glucose 70 - 99 mg/dL 11/26/2021   268     ?BUN 6 - 20 mg/dL 10   14     ?Creatinine 0.61 - 1.24 mg/dL 341  1.15     ?Sodium 135 - 145 mmol/L 131   130   131    ?Potassium 3.5 - 5.1 mmol/L 4.5   4.2   4.2    ?Chloride 98 - 111 mmol/L 103   105     ?CO2 22 - 32 mmol/L 18   13     ?Calcium 8.9 - 10.3 mg/dL 8.4   8.4     ?Total Protein 6.5 - 8.1 g/dL  5.9     ?Total Bilirubin 0.3 - 1.2 mg/dL  0.4     ?Alkaline Phos 38 - 126 U/L  62     ?AST 15 - 41 U/L  103     ?ALT 0 - 44 U/L  48     ? ? ?Recent Labs  ?  09/26/21 ?0459  ?GLUCAP 157*  ?  ? ?Pertinent Imaging: ?CT Head Wo Contrast ? ?Result Date: 09/26/2021 ?CLINICAL DATA:  Status post fall. EXAM: CT HEAD WITHOUT CONTRAST TECHNIQUE: Contiguous axial images were obtained from the base of the skull through the vertex without intravenous contrast. RADIATION DOSE REDUCTION: This exam was performed according to the departmental dose-optimization program which includes automated exposure control, adjustment of the mA and/or kV according to patient size and/or use of iterative reconstruction technique.  COMPARISON:  December 19, 2013 FINDINGS: Brain: There is mild cerebral atrophy with widening of the extra-axial spaces and ventricular dilatation. There are areas of decreased attenuation within the white matter tracts of the supratentorial brain, consistent with microvascular disease changes. Vascular: No hyperdense vessel or unexpected calcification. Skull: Normal. Negative for fracture or focal lesion. Sinuses/Orbits: No acute finding. Other: None. IMPRESSION: 1. No acute intracranial abnormality. 2. Generalized cerebral atrophy with chronic white matter small vessel ischemic changes. Electronically Signed   By: Aram Candelahaddeus  Houston M.D.   On: 09/26/2021 02:02  ? ?CT Cervical Spine Wo Contrast ? ?Result Date: 09/26/2021 ?CLINICAL DATA:  Status post fall. EXAM: CT CERVICAL SPINE WITHOUT CONTRAST TECHNIQUE: Multidetector CT imaging of the cervical spine was performed without intravenous contrast. Multiplanar CT image reconstructions were also generated. RADIATION DOSE REDUCTION: This exam was performed according to the departmental dose-optimization program which includes automated exposure control, adjustment of the mA and/or kV according to patient size and/or use of iterative reconstruction technique. COMPARISON:  August 02, 2009 FINDINGS: Alignment: Normal. Skull base and vertebrae: No acute fracture. No primary bone lesion or focal pathologic process. Soft tissues and spinal canal: No prevertebral fluid or swelling. No visible canal hematoma. Disc levels: Very mild anterior osteophyte formation is seen at the levels of C4-C5 and C5-C6. There is mild to moderate severity narrowing of the anterior atlantoaxial articulation. Mild multilevel intervertebral disc space narrowing is seen throughout all levels of the cervical spine. Mild, bilateral multilevel facet joint hypertrophy is noted. Upper chest: There is mild right apical scarring and/or atelectasis with emphysematous lung disease noted throughout the remainder of  both lungs. Other: None. IMPRESSION: 1. No acute fracture or subluxation of the cervical spine. 2. Mild multilevel degenerative changes, as described above. Emphysema (ICD10-J43.9). Electronically Signed   By: Aram Candelahaddeus  Houston M.D.   On: 09/26/2021 02:07  ? ?DG Chest Port 1 View ? ?Result Date: 09/26/2021 ?CLINICAL DATA:  Post fall EXAM: PORTABLE CHEST 1 VIEW COMPARISON:  08/27/2021 chest x-ray and CT FINDINGS: Hyperinflation with emphysema. No focal airspace disease, pleural effusion, or pneumothorax. Stable cardiomediastinal silhouette with atherosclerosis. IMPRESSION: Hyperinflation with emphysema Electronically Signed   By: Adrian ProwsKim  Fujinaga M.D.  On: 09/26/2021 00:56  ? ?US Abdomen Limited RUQ (LIVER/GB) ? ?Result Date: 09/26/2021 ?CLINICAL DATA:  61 year old male with history of hepatitis. EXAM: ULTRASOUND ABDOMEN LIMITED RIGHT UPPER QUADRANT COMPARISON:  No priors. FINDINGS: Gallbladder: Gallbladder is moderately distended. Gallbladder wall thickness is normal at 2.7 mm. There is a trace amount of amorphous echogenic material which does not demonstrate posterior acoustic shadowing indicative of a trace amount of biliary sludge lying in the gallbladder. No definite gallstones. No pericholecystic fluid. Per report from the sonographer, there was no sonographic Murphy's sign on examination. Common bile duct: Diameter: 8 mm Liver: Mild diffuse increased echogenicity throughout the hepatic parenchyma, indicative of a background of hepatic steatosis. Liver contour appears slightly nodular, suggesting early changes of cirrhosis. Portal vein is patent on color Doppler imaging with normal direction of blood flow towards the liver. Other: None. IMPRESSION: 1. Trace amount of biliary sludge lying dependently in the gallbladder. No gallstones or findings of acute cholecystitis are noted at this time. 2. Mild dilatation of the common bile duct (8 mm), without frank intrahepatic biliary ductal dilatation. The possibility of  distal sludge and/or choledocholithiasis should be considered. Obstructing pancreatic head lesion is also not excluded. Further clinical evaluation is recommended. If clinically appropriate, follow-up abdominal M

## 2021-09-26 NOTE — Hospital Course (Addendum)
[name] is a 61 year old male with a past medical history of COPD complicated by pulmonary hypertension and right heart dysfunction, hypertension, and gastritis who presents to the ED after a syncopal episode. ? ?Patient was recently hospitalized in early March for COPD exacerbation.  He reports since that time that he has had increased whitish sputum production and worsening shortness of breath.  He notes that he has required the use of his rescue inhaler approximately 5 times daily.  Patient reports that he was sitting down on the night of admission, he noted significant wheezing and shortness of breath.  When he stood up and went to the restroom he states that he became dizzy and subsequently lost consciousness.  Patient's fall was unwitnessed, however his sister-in-law with whom he lives with heard his fall and came to his attention.  He did hit his head but had no confusion when he regained consciousness.  He also noticed some palpitations when he was really short of breath.  Patient also states that over the last 2 days he has had a poor appetite which is primarily because of his persistent coughing.  He also had associated subjective fever.  He denied any loss of bowel or bladder function.  He also denied any tongue biting. ? ?Patient denies any nausea or vomiting.  He also denies constipation.  He does note that he has about 2-3 loose stools daily states that this has been ongoing for at least the past 2 months. ? ?Of note patient states that he ran out of his Spiriva about a week ago in addition to his Advair. ? ?When evaluated at bedside patient states that he continues to feel short of breath but it is much improved from his shortness of breath that preceded his syncopal event. ? ?Medications: ?Albuterol ?Advair ?Spiriva ?Atorvastatin 20 mg ?Pepcid 20 mg ?Lisinopril 20 mg ?Zoloft 20 mg ? ? ? ? ? ?4/13 ?Doing well today, reports still coughing up white mucus. 5 days now. Eating and drinking well,  good appetite. ?

## 2021-09-26 NOTE — ED Triage Notes (Signed)
Pt states that he fell in the bathroom. + LOC. Pt c/o N/V, no appetite, chills, and SHOB ?

## 2021-09-26 NOTE — Progress Notes (Signed)
Pt admitted from ED at this time. He remains awake A&Ox4 at baseline. Pt reports dyspnea with exhertion. SpO2 100% on RA; dyspnea while at rest is noted. ? ?Bed alarm set an call light within reach for pt safety. Urinal provided; UA collection pending. Pt is NPO for MRI of abdomen ?

## 2021-09-26 NOTE — TOC CM/SW Note (Signed)
Entered benefit check to see which home health agencies are in network with patient's insurance.  ?

## 2021-09-26 NOTE — ED Notes (Signed)
This paramedic discussed the D5 and normal saline infusion with the rounding team and was told that this was okay to continue running the infusion and not change it to straight D5W.  ?

## 2021-09-27 ENCOUNTER — Observation Stay (HOSPITAL_COMMUNITY): Payer: Medicaid Other

## 2021-09-27 DIAGNOSIS — E871 Hypo-osmolality and hyponatremia: Secondary | ICD-10-CM | POA: Diagnosis not present

## 2021-09-27 DIAGNOSIS — E861 Hypovolemia: Secondary | ICD-10-CM | POA: Diagnosis not present

## 2021-09-27 DIAGNOSIS — E43 Unspecified severe protein-calorie malnutrition: Secondary | ICD-10-CM | POA: Insufficient documentation

## 2021-09-27 DIAGNOSIS — I9589 Other hypotension: Secondary | ICD-10-CM

## 2021-09-27 DIAGNOSIS — R55 Syncope and collapse: Secondary | ICD-10-CM | POA: Diagnosis not present

## 2021-09-27 LAB — OSMOLALITY, URINE: Osmolality, Ur: 299 mOsm/kg — ABNORMAL LOW (ref 300–900)

## 2021-09-27 LAB — BASIC METABOLIC PANEL
Anion gap: 4 — ABNORMAL LOW (ref 5–15)
Anion gap: 8 (ref 5–15)
Anion gap: 11 (ref 5–15)
BUN: 6 mg/dL (ref 6–20)
BUN: <5 mg/dL — ABNORMAL LOW (ref 6–20)
BUN: 6 mg/dL (ref 6–20)
CO2: 24 mmol/L (ref 22–32)
CO2: 24 mmol/L (ref 22–32)
CO2: 24 mmol/L (ref 22–32)
Calcium: 8.5 mg/dL — ABNORMAL LOW (ref 8.9–10.3)
Calcium: 8.7 mg/dL — ABNORMAL LOW (ref 8.9–10.3)
Calcium: 9 mg/dL (ref 8.9–10.3)
Chloride: 103 mmol/L (ref 98–111)
Chloride: 99 mmol/L (ref 98–111)
Chloride: 96 mmol/L — ABNORMAL LOW (ref 98–111)
Creatinine, Ser: 0.79 mg/dL (ref 0.61–1.24)
Creatinine, Ser: 0.82 mg/dL (ref 0.61–1.24)
Creatinine, Ser: 0.98 mg/dL (ref 0.61–1.24)
GFR, Estimated: >60 mL/min (ref >60)
GFR, Estimated: >60 mL/min (ref >60)
GFR, Estimated: >60 mL/min (ref >60)
Glucose, Bld: 114 mg/dL — ABNORMAL HIGH (ref 70–99)
Glucose, Bld: 132 mg/dL — ABNORMAL HIGH (ref 70–99)
Glucose, Bld: 164 mg/dL — ABNORMAL HIGH (ref 70–99)
Potassium: 3.9 mmol/L (ref 3.5–5.1)
Potassium: 3.6 mmol/L (ref 3.5–5.1)
Potassium: 4.1 mmol/L (ref 3.5–5.1)
Sodium: 131 mmol/L — ABNORMAL LOW (ref 135–145)
Sodium: 131 mmol/L — ABNORMAL LOW (ref 135–145)
Sodium: 131 mmol/L — ABNORMAL LOW (ref 135–145)

## 2021-09-27 LAB — MAGNESIUM
Magnesium: 1.4 mg/dL — ABNORMAL LOW (ref 1.7–2.4)
Magnesium: 2 mg/dL (ref 1.7–2.4)
Magnesium: 1.8 mg/dL (ref 1.7–2.4)

## 2021-09-27 LAB — LACTIC ACID, PLASMA: Lactic Acid, Venous: 0.9 mmol/L (ref 0.5–1.9)

## 2021-09-27 LAB — CBC WITH DIFFERENTIAL/PLATELET
Abs Immature Granulocytes: 0.03 10*3/uL (ref 0.00–0.07)
Basophils Absolute: 0 10*3/uL (ref 0.0–0.1)
Basophils Relative: 0 %
Eosinophils Absolute: 0 10*3/uL (ref 0.0–0.5)
Eosinophils Relative: 0 %
HCT: 26.7 % — ABNORMAL LOW (ref 39.0–52.0)
Hemoglobin: 9.8 g/dL — ABNORMAL LOW (ref 13.0–17.0)
Immature Granulocytes: 1 %
Lymphocytes Relative: 14 %
Lymphs Abs: 0.8 10*3/uL (ref 0.7–4.0)
MCH: 33.9 pg (ref 26.0–34.0)
MCHC: 36.7 g/dL — ABNORMAL HIGH (ref 30.0–36.0)
MCV: 92.4 fL (ref 80.0–100.0)
Monocytes Absolute: 0.6 10*3/uL (ref 0.1–1.0)
Monocytes Relative: 11 %
Neutro Abs: 4.1 10*3/uL (ref 1.7–7.7)
Neutrophils Relative %: 74 %
Platelets: 134 10*3/uL — ABNORMAL LOW (ref 150–400)
RBC: 2.89 MIL/uL — ABNORMAL LOW (ref 4.22–5.81)
RDW: 13.1 % (ref 11.5–15.5)
WBC: 5.6 10*3/uL (ref 4.0–10.5)
nRBC: 0 % (ref 0.0–0.2)

## 2021-09-27 LAB — IRON AND TIBC
Iron: 81 ug/dL (ref 45–182)
Saturation Ratios: 30 % (ref 17.9–39.5)
TIBC: 272 ug/dL (ref 250–450)
UIBC: 191 ug/dL

## 2021-09-27 LAB — FERRITIN: Ferritin: 125 ng/mL (ref 24–336)

## 2021-09-27 LAB — SODIUM, URINE, RANDOM: Sodium, Ur: 112 mmol/L

## 2021-09-27 LAB — OSMOLALITY: Osmolality: 277 mOsm/kg (ref 275–295)

## 2021-09-27 LAB — PHOSPHORUS
Phosphorus: 1.9 mg/dL — ABNORMAL LOW (ref 2.5–4.6)
Phosphorus: 2.7 mg/dL (ref 2.5–4.6)
Phosphorus: 3 mg/dL (ref 2.5–4.6)

## 2021-09-27 LAB — CORTISOL-AM, BLOOD: Cortisol - AM: 12.8 ug/dL (ref 6.7–22.6)

## 2021-09-27 MED ORDER — COSYNTROPIN 0.25 MG IJ SOLR
0.2500 mg | Freq: Once | INTRAMUSCULAR | Status: AC
  Administered 2021-09-28: 0.25 mg via INTRAVENOUS
  Filled 2021-09-27: qty 0.25

## 2021-09-27 MED ORDER — MAGNESIUM SULFATE 2 GM/50ML IV SOLN
2.0000 g | Freq: Once | INTRAVENOUS | Status: AC
  Administered 2021-09-27: 2 g via INTRAVENOUS
  Filled 2021-09-27: qty 50

## 2021-09-27 MED ORDER — PREDNISONE 50 MG PO TABS
50.0000 mg | ORAL_TABLET | Freq: Every day | ORAL | Status: DC
  Administered 2021-09-28 – 2021-09-29 (×2): 50 mg via ORAL
  Filled 2021-09-27 (×2): qty 1

## 2021-09-27 MED ORDER — ACETAMINOPHEN 650 MG RE SUPP: 650.0000 mg | Freq: Four times a day (QID) | RECTAL | Status: DC | PRN

## 2021-09-27 MED ORDER — ACETAMINOPHEN 500 MG PO TABS
1000.0000 mg | ORAL_TABLET | Freq: Four times a day (QID) | ORAL | Status: DC | PRN
  Administered 2021-10-02: 1000 mg via ORAL
  Filled 2021-09-27: qty 2

## 2021-09-27 MED ORDER — ENSURE ENLIVE PO LIQD
237.0000 mL | Freq: Three times a day (TID) | ORAL | Status: DC
  Administered 2021-09-27 – 2021-10-03 (×22): 237 mL via ORAL

## 2021-09-27 MED ORDER — DICLOFENAC SODIUM 1 % EX GEL
2.0000 g | Freq: Three times a day (TID) | CUTANEOUS | Status: DC | PRN
  Administered 2021-09-27: 2 g via TOPICAL
  Filled 2021-09-27: qty 100

## 2021-09-27 MED ORDER — SODIUM CHLORIDE 0.9 % IV BOLUS
500.0000 mL | Freq: Once | INTRAVENOUS | Status: AC
  Administered 2021-09-27: 500 mL via INTRAVENOUS

## 2021-09-27 MED ORDER — DEXTROSE 5 % IV SOLN
30.0000 mmol | Freq: Once | INTRAVENOUS | Status: AC
  Administered 2021-09-27: 30 mmol via INTRAVENOUS
  Filled 2021-09-27: qty 10

## 2021-09-27 NOTE — Progress Notes (Addendum)
? ? ?HD#0 ?SUBJECTIVE:  ?Patient Summary: Jaime Cline is a 61 y.o. with a pertinent PMH of COPD and alcohol use disorder, who presented with syncope and admitted for COPD exacerbation.  ? ?Overnight Events: Negative MRI with MRCP, negative rus.  Underwent esophagram ? ?  ?Interm History: Patient seen and evaluated at bedside.  States he is feeling much better.  Breathing has improved and he is able to ambulate comfortably to the restroom with the assistance of his walker.  He is still coughing up some sputum.  Much less tremulous.  Does report continued diarrhea described as watery approximately 3 episodes since being admitted in the hospital. Has not noticed any bleeding, no blood in urine or stool. ? ?OBJECTIVE:  ?Vital Signs: ?Vitals:  ? 09/26/21 1456 09/26/21 2139 09/27/21 0325 09/27/21 0755  ?BP: 123/83  127/78 111/71  ?Pulse: 82  84 81  ?Resp: 20  18 18   ?Temp: 98.6 ?F (37 ?C)  97.8 ?F (36.6 ?C) 98.3 ?F (36.8 ?C)  ?TempSrc: Oral  Oral Oral  ?SpO2: 100% 98% 96% 100%  ?Weight:      ?Height:      ? ?Supplemental O2:  ?SpO2: 100 % ? ?Filed Weights  ? 09/26/21 0004  ?Weight: 43.9 kg  ? ? ? ?Intake/Output Summary (Last 24 hours) at 09/27/2021 1141 ?Last data filed at 09/27/2021 0801 ?Gross per 24 hour  ?Intake 1890 ml  ?Output 1100 ml  ?Net 790 ml  ? ?Net IO Since Admission: 1,790 mL [09/27/21 1141] ? ?Physical Exam: ?Constitutional: Cachectic appearing and in no distress.  Appears much improved compared to yesterday, alert and more talkative.  Sitting up side of bed eating. ?HENT: Edentulous ?Head: Bitemporal wasting and atraumatic. Tender to palpation over left forehead ?Eyes: EOM are normal.  ?Neck: Normal range of motion.  ?Cardiovascular: Normal rate, regular rhythm, intact distal pulses. No gallop and no friction rub.  ?No murmur heard. No lower extremity edema  ?Pulmonary: Breathing comfortably room air, transmitted upper airway sounds ?Abdominal: Soft. Normal bowel sounds. Non distended and non  tender ?Musculoskeletal: Normal range of motion.     ?   General: No tenderness or edema.  ?Neurological: Alert and oriented to person, place, and time.   ?Skin: Skin is warm and dry.  ? ?Patient Lines/Drains/Airways Status   ? ? Active Line/Drains/Airways   ? ? Name Placement date Placement time Site Days  ? Peripheral IV 08/27/21 24 G Anterior;Proximal;Right Forearm 08/27/21  0847  Forearm  30  ? Peripheral IV 09/26/21 20 G Anterior;Left Forearm 09/26/21  --  Forearm  less than 1  ? Peripheral IV 09/26/21 20 G 1.16" Anterior;Right;Upper Arm 09/26/21  0650  Arm  less than 1  ? ?  ?  ? ?  ? ? ?Pertinent Labs: ? ?  Latest Ref Rng & Units 09/27/2021  ?  5:01 AM 09/26/2021  ?  5:20 AM 09/26/2021  ? 12:51 AM  ?CBC  ?WBC 4.0 - 10.5 K/uL 5.6    7.2    ?Hemoglobin 13.0 - 17.0 g/dL 9.8   11/26/2021   16.1    ?Hematocrit 39.0 - 52.0 % 26.7   42.0   43.5    ?Platelets 150 - 400 K/uL 134    184    ? ? ? ?  Latest Ref Rng & Units 09/27/2021  ?  6:45 AM 09/26/2021  ?  9:32 PM 09/26/2021  ?  7:12 PM  ?CMP  ?Glucose 70 - 99 mg/dL 11/26/2021  136   191    ?BUN 6 - 20 mg/dL 6   6   7     ?Creatinine 0.61 - 1.24 mg/dL   6.25   6.38    ?Sodium 135 - 145 mmol/L 131   130   129    ?Potassium 3.5 - 5.1 mmol/L 3.9   4.3   4.6    ?Chloride 98 - 111 mmol/L 103   98   99    ?CO2 22 - 32 mmol/L 24   22   22     ?Calcium 8.9 - 10.3 mg/dL 8.5   8.9   8.7    ? ? ?Recent Labs  ?  09/26/21 ?0459  ?GLUCAP 157*  ?  ? ?Pertinent Imaging: ? RENAL ? ?Result Date: 09/26/2021 ?CLINICAL DATA:  Acute kidney injury. EXAM: RENAL / URINARY TRACT ULTRASOUND COMPLETE COMPARISON:  None. FINDINGS: Right Kidney: Renal measurements: 9.8 x 5.1 x 4.9 cm = volume: 129 mL. Echogenicity within normal limits. No mass or hydronephrosis visualized. Left Kidney: Renal measurements: 9.6 x 5.2 x 4.6 cm = volume: 121 mL. Echogenicity within normal limits. No mass or hydronephrosis visualized. Bladder: Appears normal for degree of bladder distention. Other: None. IMPRESSION: Normal renal  ultrasound. Electronically Signed   By: [place] M.D.   On: 09/26/2021 23:59  ? ?MR ABDOMEN MRCP W WO CONTAST ? ?Result Date: 09/27/2021 ?CLINICAL DATA:  61 year old male with mild dilatation of the common bile duct. Evaluate for possible obstructing pancreatic mass. EXAM: MRI ABDOMEN WITHOUT AND WITH CONTRAST (INCLUDING MRCP) TECHNIQUE: Multiplanar multisequence MR imaging of the abdomen was performed both before and after the administration of intravenous contrast. Heavily T2-weighted images of the biliary and pancreatic ducts were obtained, and three-dimensional MRCP images were rendered by post processing. CONTRAST:  71mL GADAVIST GADOBUTROL 1 MMOL/ML IV SOLN COMPARISON:  No prior abdominal MRI. Abdominal ultrasound 09/26/2021. FINDINGS: Comment: Portions of today's examination are severely limited by patient motion, including many of the MRCP images. Lower chest: Unremarkable. Hepatobiliary: Diffuse loss of signal intensity throughout the hepatic parenchyma on out of phase dual echo images, indicative of a background of hepatic steatosis. No suspicious cystic or solid hepatic lesions. No intrahepatic biliary ductal dilatation. Common bile duct is normal in caliber measuring 5 mm in the porta hepatis. Within the limitations of today's examination, there are no definite filling defects within the common bile duct to indicate the presence of choledocholithiasis. Gallbladder is normal in appearance. Pancreas: No pancreatic mass. No pancreatic ductal dilatation. No pancreatic or peripancreatic fluid collections or inflammatory changes. Spleen:  Unremarkable. Adrenals/Urinary Tract: Bilateral kidneys and adrenal glands are normal in appearance. No hydroureteronephrosis in the visualized portions of the abdomen. Stomach/Bowel: Visualized portions are unremarkable. Vascular/Lymphatic: No aneurysm identified in the visualized abdominal vasculature. No lymphadenopathy noted in the abdomen. Other: No significant volume  of ascites noted in the visualized portions of the peritoneal cavity. Musculoskeletal: No aggressive appearing osseous lesions are noted in the visualized portions of the skeleton. IMPRESSION: 1. Interval resolution of previously noted mild common bile duct dilatation. No choledocholithiasis. 2. No suspicious pancreatic lesion identified. 3. Hepatic steatosis. Electronically Signed   By: 4m M.D.   On: 09/27/2021 06:48  ? ?DG ESOPHAGUS W DOUBLE CM (HD) ? ?Result Date: 09/27/2021 ?CLINICAL DATA:  Patient complains of dysphagia, scratchy feeling in throat with eating and feeling as food is getting stuck in throat and chest after swallowing liquids and food. EXAM: ESOPHAGUS/BARIUM SWALLOW/TABLET STUDY TECHNIQUE: Combined double  and single contrast examination was performed using effervescent crystals, high-density barium, and thin liquid barium. This exam was performed by Alex GardenerSusan Clapp, NP, and was supervised and interpreted by Gaylyn RongWalter Liebkemann, MD. FLUOROSCOPY: Radiation Exposure Index (as provided by the fluoroscopic device): 22.50 mGy Kerma COMPARISON:  NONE. FINDINGS: Swallowing: Prominent cricopharyngeus, otherwise unremarkable. Esophagus: Due to dysmotility we were unable to substantially distend the esophagus, especially the distal esophagus. Because of this, excluding the possibility of smooth stricturing or narrowing in the esophagus is difficult in today's case. No esophageal luminal irregularity is observed. No obvious focal narrowing seen. Esophageal motility: Disruption of primary peristalsis on almost all swallows, with multiple episodes of to and fro motion of barium in the esophagus. Hiatal Hernia: None. Gastroesophageal reflux: None visualized. Ingested 13mm barium tablet: Extended delay of transit in the distal esophagus, with uncertainty as to whether this is due to an actual narrowing of the esophagus or simply due to the substantial dysmotility. Other: None. IMPRESSION: 1. Disruption of  primary peristaltic waves on almost all swallows, with to and fro motion of barium in the esophagus, compatible with nonspecific esophageal dysmotility disorder. 2. The degree of dysmotility matted difficult to dist

## 2021-09-27 NOTE — TOC Benefit Eligibility Note (Signed)
Transition of Care (TOC) Benefit Eligibility Note  ? ? ?Patient Details  ?Name: Jaime Cline ?MRN: [id number] ?Date of Birth: [birth year] ? ? ?  ? ?  ? ?  ? ?  ? ?Spoke with Person/Company/Phone Number:: Eather Colas  [employer] Ph# [phone] ? ?  ? ?  ? ?  ? ?Additional Notes: Home Health Agencies: [facility], [facility], [facility] ? ? ? ?Renie Ora ?Phone Number: ?09/27/2021, 10:24 AM ? ? ? ? ?

## 2021-09-27 NOTE — Progress Notes (Signed)
Mobility Specialist Progress Note: ? ? 09/27/21 1626  ?Mobility  ?Activity Ambulated with assistance in hallway  ?Level of Assistance Standby assist, set-up cues, supervision of patient - no hands on  ?Assistive Device Four wheel walker  ?Distance Ambulated (ft) 200 ft  ?Activity Response Tolerated well  ?$Mobility charge 1 Mobility  ? ?Pt received in bed willing to participate in mobility. No complaints of pain. Pt stated toward the end of ambulation he was getting SOB. Left EOB with call bell in reach and all needs met.   ? ?Jaime Cline ?Mobility Specialist ?Primary Phone [phone] ? ?

## 2021-09-27 NOTE — Social Work (Signed)
CSW met with pt at bedside to discuss alcohol use. Pt reports he drinks about 2 12 oz beers once a week. Pt reports he does not have a problem with his alcohol use. Pt did accept resources.  ? ?Emeterio Reeve, LCSW ?Clinical Social Worker ? ?

## 2021-09-27 NOTE — Progress Notes (Signed)
Occupational Therapy Evaluation ?Patient Details ?Name: Jaime Cline ?MRN: [id number] ?DOB: [birth year] ?Today's Date: 09/27/2021 ? ? ?History of Present Illness Pt is a 61 y/o male admitted secondary to syncopal episode. PMH includes COPD, pulmonary HTN, alcohol abuse, and HTN.  ? ?Clinical Impression ?  ?Pt lives with his sister in law, walks independently and is assisted with shower transfer, housekeeping and med management. Pt sits to shower. He reports he is easily SOB. Pt presents with generalized weakness, impaired standing balance and decreased activity tolerance. He needs set up to min guard assist for ADLs. Began educating pt in energy conservation strategies. Will follow acutely.  ?   ? ?Recommendations for follow up therapy are one component of a multi-disciplinary discharge planning process, led by the attending physician.  Recommendations may be updated based on patient status, additional functional criteria and insurance authorization.  ? ?Follow Up Recommendations ? Home health OT  ?  ?Assistance Recommended at Discharge Intermittent Supervision/Assistance  ?Patient can return home with the following A little help with walking and/or transfers;A little help with bathing/dressing/bathroom;Assistance with cooking/housework;Assist for transportation;Help with stairs or ramp for entrance ? ?  ?Functional Status Assessment ? Patient has had a recent decline in their functional status and demonstrates the ability to make significant improvements in function in a reasonable and predictable amount of time.  ?Equipment Recommendations ? Other (comment) (rollator)  ?  ?Recommendations for Other Services   ? ? ?  ?Precautions / Restrictions Precautions ?Precautions: Fall ?Precaution Comments: CIWA ?Restrictions ?Weight Bearing Restrictions: No  ? ?  ? ?Mobility Bed Mobility ?Overal bed mobility: Modified Independent ?  ?  ?  ?  ?  ?  ?General bed mobility comments: HOB up ?  ? ?Transfers ?Overall transfer level:  Needs assistance ?Equipment used: Rolling walker (2 wheels) ?Transfers: Sit to/from Stand ?Sit to Stand: Min guard ?  ?  ?  ?  ?  ?General transfer comment: from bed and toilet ?  ? ?  ?Balance Overall balance assessment: Needs assistance ?  ?Sitting balance-Leahy Scale: Good ?  ?  ?  ?Standing balance-Leahy Scale: Fair ?Standing balance comment: fair static, poor dynamic ?  ?  ?  ?  ?  ?  ?  ?  ?  ?  ?  ?   ? ?ADL either performed or assessed with clinical judgement  ? ?ADL Overall ADL's : Needs assistance/impaired ?Eating/Feeding: Independent;Bed level ?  ?Grooming: Min guard;Standing;Wash/dry hands ?  ?Upper Body Bathing: Set up;Sitting ?  ?  ?  ?Upper Body Dressing : Set up;Sitting ?  ?  ?  ?Toilet Transfer: Min guard;Ambulation;Rolling walker (2 wheels);Comfort height toilet ?  ?Toileting- Clothing Manipulation and Hygiene: Set up;Sitting/lateral lean ?  ?  ?  ?Functional mobility during ADLs: Min guard;Rolling walker (2 wheels) ?   ? ? ? ?Vision Ability to See in Adequate Light: 0 Adequate ?Patient Visual Report: No change from baseline ?   ?   ?Perception   ?  ?Praxis   ?  ? ?Pertinent Vitals/Pain Pain Assessment ?Pain Assessment: No/denies pain  ? ? ? ?Hand Dominance Right ?  ?Extremity/Trunk Assessment Upper Extremity Assessment ?Upper Extremity Assessment: Overall WFL for tasks assessed ?  ?Lower Extremity Assessment ?Lower Extremity Assessment: Defer to PT evaluation ?  ?Cervical / Trunk Assessment ?Cervical / Trunk Assessment: Kyphotic ?  ?Communication Communication ?Communication: No difficulties ?  ?Cognition Arousal/Alertness: Awake/alert ?Behavior During Therapy: [facility] for tasks assessed/performed ?Overall Cognitive Status: No family/caregiver present to determine  baseline cognitive functioning ?  ?  ?  ?  ?  ?  ?  ?  ?  ?  ?  ?  ?  ?  ?  ?  ?  ?  ?  ?General Comments    ? ?  ?Exercises   ?  ?Shoulder Instructions    ? ? ?Home Living Family/patient expects to be discharged to:: Private  residence ?Living Arrangements: Non-relatives/Friends (sister in law) ?Available Help at Discharge: Family;Available PRN/intermittently ?Type of Home: Apartment ?Home Access: Stairs to enter ?Entrance Stairs-Number of Steps: 6-7 ?Entrance Stairs-Rails: Right;Left ?Home Layout: One level ?  ?  ?Bathroom Shower/Tub: Tub/shower unit ?  ?Bathroom Toilet: Standard ?  ?  ?Home Equipment: Shower seat ?  ?  ?  ? ?  ?Prior Functioning/Environment Prior Level of Function : Needs assist ?  ?  ?  ?  ?  ?  ?Mobility Comments: Reports independence ?ADLs Comments: Reports sister in law has been helps him into the bathroom to shower and with housekeeping tasks and medications, pt cooks his own meals ?  ? ?  ?  ?OT Problem List: Impaired balance (sitting and/or standing);Decreased coordination;Decreased knowledge of use of DME or AE ?  ?   ?OT Treatment/Interventions:    ?  ?OT Goals(Current goals can be found in the care plan section) Acute Rehab OT Goals ?Time For Goal Achievement: 10/11/21 ?Potential to Achieve Goals: Good  ?OT Frequency: Min 2X/week ?  ? ?Co-evaluation   ?  ?  ?  ?  ? ?  ?AM-PAC OT "6 Clicks" Daily Activity     ?Outcome Measure Help from another person eating meals?: None ?Help from another person taking care of personal grooming?: A Little ?Help from another person toileting, which includes using toliet, bedpan, or urinal?: A Little ?Help from another person bathing (including washing, rinsing, drying)?: A Little ?Help from another person to put on and taking off regular upper body clothing?: None ?Help from another person to put on and taking off regular lower body clothing?: A Little ?6 Click Score: 20 ?  ?End of Session Equipment Utilized During Treatment: Rolling walker (2 wheels);Gait belt ?Nurse Communication: Other (comment) (CM--pt prefers rollator to RW) ? ?Activity Tolerance: Patient tolerated treatment well ?Patient left: in bed;with call bell/phone within reach;with bed alarm set ? ?OT Visit  Diagnosis: Other abnormalities of gait and mobility (R26.89);Unsteadiness on feet (R26.81);Other symptoms and signs involving cognitive function  ?              ?Time: [id number] ?OT Time Calculation (min): 31 min ?Charges:  OT General Charges ?$OT Visit: 1 Visit ?OT Evaluation ?$OT Eval Low Complexity: 1 Low ?OT Treatments ?$Self Care/Home Management : 8-22 mins ? ?Nestor Lewandowsky, OTR/L ?Acute Rehabilitation Services ?Pager: [phone] ?Office: [phone]  ?Malka So ?09/27/2021, 2:32 PM ?

## 2021-09-27 NOTE — Progress Notes (Signed)
SATURATION QUALIFICATIONS: (This note is used to comply with regulatory documentation for home oxygen)  Patient Saturations on Room Air at Rest = 100%  Patient Saturations on Room Air while Ambulating = 95%  Patient Saturations on n/a Liters of oxygen while Ambulating = n/a%   

## 2021-09-27 NOTE — Progress Notes (Signed)
Mobility Specialist: Progress Note ? ? 09/27/21 1146  ?Mobility  ?Activity Ambulated with assistance in hallway  ?Level of Assistance Contact guard assist, steadying assist  ?Assistive Device Front wheel walker  ?Distance Ambulated (ft) 170 ft  ?Activity Response Tolerated well  ?$Mobility charge 1 Mobility  ? ?Pt received in bed and agreeable to ambulation. C/o feeling SOB during session requiring x1 brief standing break before returning to the room. No c/o pain or dizziness throughout. Pt back to bed with call bell and phone at his side. RN present in the room.  ? ?Jaime Cline ?Mobility Specialist ?Mobility Specialist 5 North: [phone] ?Mobility Specialist 6 North: [phone] ? ?

## 2021-09-27 NOTE — TOC Initial Note (Addendum)
Transition of Care (TOC) - Initial/Assessment Note  ? ? ?Patient Details  ?Name: Jaime Cline ?MRN: [id number] ?Date of Birth: 24-Feb-1961 ? ?Transition of Care (TOC) CM/SW Contact:    ?Kingsley Plan, RN ?Phone Number: ?09/27/2021, 12:54 PM ? ?Clinical Narrative:                 ?Patient from home with sister who can assist.  ? ?PT recommended walker. Same ordered with Selena Batten with [facility].  ? ?PT also recommended Home health PT.  ? ?Patient in agreement.  ? ?NCM called following agencies : ? ?[facility] cannot accept due to staffing. ? ?[facility] cannot accept due to staffing.  ? ?[facility] cannot accept due to staffing.  ? ?[facility] cannot accept due to insurance.  ? ?[facility] cannot accept. ? ?[facility] cannot accept  ? ?[facility] cannot accept ? ? ?[facility] cannot accept.  ? ?[facility] cannot accept  ? ?[facility] cannot accept ? ?[facility] cannot accept  ? ?NCM has exhausted medicare.gov list of home health agencies for patient's address.  ? ?NCM discussed OP PT/SP patient has transportation to [facility] location . Asked MD for orders.  ? ? ?Patient needs PCP . [facility], [facility] and [facility] have no available appointments. DR Burnice Logan aware and patient can follow up in their clinic he will send a message to his clinic to schedule and add information to AVS   ?Information placed on AVS. ? ? [address] [employer] delivered a rolling walker PT had recommended walker.  Patient now requesting rollator , OT recommending rollator . Called [facility] they will change walker to rollator ? ?Expected Discharge Plan: Home/Self Care ?Barriers to Discharge: Continued Medical Work up ? ? ?Patient Goals and CMS Choice ?Patient states their goals for this hospitalization and ongoing recovery are:: to return to home ?CMS Medicare.gov Compare Post Acute Care list provided to:: Patient ?  ? ?Expected Discharge Plan and Services ?Expected Discharge Plan: Home/Self Care ?  ?Discharge Planning Services:  CM Consult ?  ?Living arrangements for the past 2 months: Single Family Home ?                ?DME Arranged: Walker rolling ?DME Agency: [facility] ?Date DME Agency Contacted: 09/27/21 ?Time DME Agency Contacted: 1251 ?Representative spoke with at DME Agency: Velna Hatchet ?  ?  ?  ?  ?Representative spoke with at [facility] Agency: see note ? ?Prior Living Arrangements/Services ?Living arrangements for the past 2 months: Single Family Home ?Lives with:: Siblings ?Patient language and need for interpreter reviewed:: Yes ?Do you feel safe going back to the place where you live?: Yes      ?Need for Family Participation in Patient Care: Yes (Comment) ?Care giver support system in place?: Yes (comment) ?  ?Criminal Activity/Legal Involvement Pertinent to Current Situation/Hospitalization: No - Comment as needed ? ?Activities of Daily Living ?Home Assistive Devices/Equipment: None ?ADL Screening (condition at time of admission) ?Patient's cognitive ability adequate to safely complete daily activities?: Yes ?Is the patient deaf or have difficulty hearing?: No ?Does the patient have difficulty seeing, even when wearing glasses/contacts?: No ?Does the patient have difficulty concentrating, remembering, or making decisions?: No ?Patient able to express need for assistance with ADLs?: Yes ?Does the patient have difficulty dressing or bathing?: No ?Independently performs ADLs?: Yes (appropriate for developmental age) ?Does the patient have difficulty walking or climbing stairs?: Yes ?Weakness of Legs: Both ?Weakness of Arms/Hands: None ? ?Permission Sought/Granted ?  ?Permission granted to share  information with : No ?   ?   ?   ?   ? ?Emotional Assessment ?Appearance:: Appears stated age ?Attitude/Demeanor/Rapport: Engaged ?Affect (typically observed): Accepting ?Orientation: : Oriented to Self, Oriented to Place, Oriented to  Time, Oriented to Situation ?Alcohol / Substance Use: Not Applicable ?Psych Involvement: No (comment) ? ?Admission  diagnosis:  Syncope and collapse [R55] ?Hyponatremia [E87.1] ?Syncope [R55] ?Hypoglycemia [E16.2] ?AKI (acute kidney injury) (HCC) [N17.9] ?Elevated lactic acid level [R79.89] ?Alcohol use disorder [F10.90] ?Hypotension due to hypovolemia [I95.89, E86.1] ?Patient Active Problem List  ? Diagnosis Date Noted  ? Syncope 09/26/2021  ? Community acquired pneumonia of right lower lobe of lung   ? COPD exacerbation (HCC) 08/27/2021  ? Asthma, chronic 12/31/2012  ? Smoker 12/31/2012  ? Knee pain, bilateral 12/31/2012  ? Iron deficiency anemia 12/31/2012  ? Knee pain, chronic 12/31/2012  ? ?PCP:  Default, Provider, MD ?Pharmacy:   ?[facility], [place] - [address] ?[address] ?[place] [address] ?Phone: [phone] Fax: [phone] ? ?[id number] [place], [place] - [address] ?[address] ?[place] [address] ?Phone: [phone] Fax: [phone] ? ?[id number] [place], [place] - [phone] AT [facility] [address] ?[address] ?[place] [address] ?Phone: [phone] Fax: [phone] ? ?[facility] ?[address] ?[place] [address] ?Phone: [phone] Fax: [phone] ? ? ? ? ?Social Determinants of Health (SDOH) Interventions ?  ? ?Readmission Risk Interventions ?   ? View : No data to display.  ?  ?  ?  ? ? ? ?

## 2021-09-27 NOTE — Progress Notes (Signed)
?Initial Nutrition Assessment ? ?DOCUMENTATION CODES:  ?Severe malnutrition in context of chronic illness, Underweight ? ?INTERVENTION:  ?Continue current diet as ordered, encourage PO intake ?Ensure Enlive po QID, each supplement provides 350 kcal and 20 grams of protein. ?Continue MVI, thiamine, and folic acid daily ?Magic cup TID with meals, each supplement provides 290 kcal and 9 grams of protein ? ?NUTRITION DIAGNOSIS:  ?Severe Malnutrition related to chronic illness (COPD) as evidenced by severe fat depletion, severe muscle depletion. ? ?GOAL:  ?Patient will meet greater than or equal to 90% of their needs ? ?MONITOR:  ?PO intake, Supplement acceptance, Weight trends ? ?REASON FOR ASSESSMENT:  ?Consult ?Assessment of nutrition requirement/status ? ?ASSESSMENT:  ?61 y.o. male with hx of EtOH abuse, COPD, and HTN presented to ED after a fall at home where his head was struck. Pt reports poor appetite PTA. ? ?Pt resting in bed at the time of assessment. Pt reports having a good appetite and that he is eating at his baseline. Reports that he normally eats 3x/d at home, is not selective with his foods. Inquired about typical meals, pt reports eating very hearty meals (breakfast: grits, eggs, sausage, lunch/dinner: steak, chicken, porkchops), but has lost weight. Obtained new bed weight ? ?Pt reports that he makes all of his meals, but does endorse shortness of breath due to his COPD. Agreeable to ensure supplements. Likes chocolate ? ?Average Meal Intake: ?4/11: 100% intake x 1 recorded meals ? ?Nutritionally Relevant Medications: ?Scheduled Meds: ? azithromycin  250 mg Oral Daily  ? folic acid  1 mg Oral Daily  ? multivitamin with minerals  1 tablet Oral Daily  ? pantoprazole  40 mg Oral Daily  ? predniSONE  50 mg Oral Q breakfast  ? thiamine  100 mg Oral Daily  ? ?Continuous Infusions: ? dextrose 5% lactated ringers 50 mL/hr at 09/27/21 0534  ? magnesium sulfate bolus IVPB    ? sodium phosphate  Dextrose 5% IVPB     ? ?Labs Reviewed: ?Sodium 131 ?Phosphorus 1.9 ?Magnesium 1.4  ? ?NUTRITION - FOCUSED PHYSICAL EXAM: ?Flowsheet Row Most Recent Value  ?Orbital Region Severe depletion  ?Upper Arm Region Severe depletion  ?Thoracic and Lumbar Region Severe depletion  ?Buccal Region Severe depletion  ?Temple Region Severe depletion  ?Clavicle Bone Region Severe depletion  ?Clavicle and Acromion Bone Region Severe depletion  ?Scapular Bone Region Severe depletion  ?Dorsal Hand Severe depletion  ?Patellar Region Severe depletion  ?Anterior Thigh Region Severe depletion  ?Posterior Calf Region Severe depletion  ?Edema (RD Assessment) None  ?Hair Reviewed  ?Eyes Reviewed  ?Mouth Reviewed  ?Skin Reviewed  ?Nails Reviewed  ? ?Diet Order:   ?Diet Order   ? ?       ?  Diet regular Room service appropriate? Yes; Fluid consistency: Thin  Diet effective now       ?  ? ?  ?  ? ?  ? ? ?EDUCATION NEEDS:  ?Education needs have been addressed ? ?Skin:  Skin Assessment: Reviewed RN Assessment ? ?Last BM:  4/10 ? ?Height:  ?Ht Readings from Last 1 Encounters:  ?09/26/21 6' (1.829 m)  ? ? ?Weight:  ?Wt Readings from Last 1 Encounters:  ?09/27/21 45.7 kg  ? ? ?Ideal Body Weight:  80.9 kg ? ?BMI:  Body mass index is 13.66 kg/m?. ? ?Estimated Nutritional Needs:  ?Kcal:  1800-2000 kcal/d ?Protein:  90-110 g/d ?Fluid:  >/= 2L/d ? ? ?Greig Castilla, RD, LDN ?Clinical Dietitian ?RD pager # available in  [facility]  ?After hours/weekend pager # available in [place] ?

## 2021-09-28 DIAGNOSIS — E162 Hypoglycemia, unspecified: Secondary | ICD-10-CM | POA: Diagnosis present

## 2021-09-28 DIAGNOSIS — K7010 Alcoholic hepatitis without ascites: Secondary | ICD-10-CM | POA: Diagnosis present

## 2021-09-28 DIAGNOSIS — I10 Essential (primary) hypertension: Secondary | ICD-10-CM | POA: Diagnosis present

## 2021-09-28 DIAGNOSIS — F17210 Nicotine dependence, cigarettes, uncomplicated: Secondary | ICD-10-CM | POA: Diagnosis present

## 2021-09-28 DIAGNOSIS — E872 Acidosis, unspecified: Secondary | ICD-10-CM | POA: Diagnosis present

## 2021-09-28 DIAGNOSIS — M549 Dorsalgia, unspecified: Secondary | ICD-10-CM | POA: Diagnosis present

## 2021-09-28 DIAGNOSIS — R0902 Hypoxemia: Secondary | ICD-10-CM | POA: Diagnosis present

## 2021-09-28 DIAGNOSIS — E861 Hypovolemia: Secondary | ICD-10-CM | POA: Diagnosis present

## 2021-09-28 DIAGNOSIS — E43 Unspecified severe protein-calorie malnutrition: Secondary | ICD-10-CM | POA: Diagnosis present

## 2021-09-28 DIAGNOSIS — N179 Acute kidney failure, unspecified: Secondary | ICD-10-CM | POA: Diagnosis present

## 2021-09-28 DIAGNOSIS — Z681 Body mass index (BMI) 19 or less, adult: Secondary | ICD-10-CM | POA: Diagnosis not present

## 2021-09-28 DIAGNOSIS — E2740 Unspecified adrenocortical insufficiency: Secondary | ICD-10-CM | POA: Diagnosis present

## 2021-09-28 DIAGNOSIS — E871 Hypo-osmolality and hyponatremia: Secondary | ICD-10-CM | POA: Diagnosis present

## 2021-09-28 DIAGNOSIS — I951 Orthostatic hypotension: Secondary | ICD-10-CM | POA: Diagnosis present

## 2021-09-28 DIAGNOSIS — R55 Syncope and collapse: Secondary | ICD-10-CM | POA: Diagnosis present

## 2021-09-28 DIAGNOSIS — F10129 Alcohol abuse with intoxication, unspecified: Secondary | ICD-10-CM | POA: Diagnosis present

## 2021-09-28 DIAGNOSIS — G8929 Other chronic pain: Secondary | ICD-10-CM | POA: Diagnosis present

## 2021-09-28 DIAGNOSIS — J439 Emphysema, unspecified: Secondary | ICD-10-CM | POA: Diagnosis present

## 2021-09-28 DIAGNOSIS — Y907 Blood alcohol level of 200-239 mg/100 ml: Secondary | ICD-10-CM | POA: Diagnosis present

## 2021-09-28 DIAGNOSIS — Z79899 Other long term (current) drug therapy: Secondary | ICD-10-CM | POA: Diagnosis not present

## 2021-09-28 DIAGNOSIS — K224 Dyskinesia of esophagus: Secondary | ICD-10-CM | POA: Diagnosis present

## 2021-09-28 DIAGNOSIS — R64 Cachexia: Secondary | ICD-10-CM | POA: Diagnosis present

## 2021-09-28 DIAGNOSIS — E860 Dehydration: Secondary | ICD-10-CM | POA: Diagnosis present

## 2021-09-28 LAB — BASIC METABOLIC PANEL
Anion gap: 8 (ref 5–15)
Anion gap: 10 (ref 5–15)
Anion gap: 10 (ref 5–15)
BUN: 8 mg/dL (ref 6–20)
BUN: 13 mg/dL (ref 6–20)
BUN: 13 mg/dL (ref 6–20)
CO2: 27 mmol/L (ref 22–32)
CO2: 25 mmol/L (ref 22–32)
CO2: 24 mmol/L (ref 22–32)
Calcium: 9 mg/dL (ref 8.9–10.3)
Calcium: 9 mg/dL (ref 8.9–10.3)
Calcium: 9.1 mg/dL (ref 8.9–10.3)
Chloride: 95 mmol/L — ABNORMAL LOW (ref 98–111)
Chloride: 93 mmol/L — ABNORMAL LOW (ref 98–111)
Chloride: 94 mmol/L — ABNORMAL LOW (ref 98–111)
Creatinine, Ser: 0.83 mg/dL (ref 0.61–1.24)
Creatinine, Ser: 0.84 mg/dL (ref 0.61–1.24)
Creatinine, Ser: 0.79 mg/dL (ref 0.61–1.24)
GFR, Estimated: >60 mL/min (ref >60)
GFR, Estimated: >60 mL/min (ref >60)
GFR, Estimated: >60 mL/min (ref >60)
Glucose, Bld: 97 mg/dL (ref 70–99)
Glucose, Bld: 180 mg/dL — ABNORMAL HIGH (ref 70–99)
Glucose, Bld: 176 mg/dL — ABNORMAL HIGH (ref 70–99)
Potassium: 4.4 mmol/L (ref 3.5–5.1)
Potassium: 4.6 mmol/L (ref 3.5–5.1)
Potassium: 4.6 mmol/L (ref 3.5–5.1)
Sodium: 130 mmol/L — ABNORMAL LOW (ref 135–145)
Sodium: 128 mmol/L — ABNORMAL LOW (ref 135–145)
Sodium: 128 mmol/L — ABNORMAL LOW (ref 135–145)

## 2021-09-28 LAB — COMPREHENSIVE METABOLIC PANEL
ALT: 39 U/L (ref 0–44)
AST: 58 U/L — ABNORMAL HIGH (ref 15–41)
Albumin: 3.1 g/dL — ABNORMAL LOW (ref 3.5–5.0)
Alkaline Phosphatase: 54 U/L (ref 38–126)
Anion gap: 7 (ref 5–15)
BUN: 7 mg/dL (ref 6–20)
CO2: 25 mmol/L (ref 22–32)
Calcium: 8.7 mg/dL — ABNORMAL LOW (ref 8.9–10.3)
Chloride: 97 mmol/L — ABNORMAL LOW (ref 98–111)
Creatinine, Ser: 0.91 mg/dL (ref 0.61–1.24)
GFR, Estimated: >60 mL/min (ref >60)
Glucose, Bld: 163 mg/dL — ABNORMAL HIGH (ref 70–99)
Potassium: 4.4 mmol/L (ref 3.5–5.1)
Sodium: 129 mmol/L — ABNORMAL LOW (ref 135–145)
Total Bilirubin: 0.4 mg/dL (ref 0.3–1.2)
Total Protein: 5.3 g/dL — ABNORMAL LOW (ref 6.5–8.1)

## 2021-09-28 LAB — CBC WITH DIFFERENTIAL/PLATELET
Abs Immature Granulocytes: 0.03 10*3/uL (ref 0.00–0.07)
Basophils Absolute: 0 10*3/uL (ref 0.0–0.1)
Basophils Relative: 0 %
Eosinophils Absolute: 0 10*3/uL (ref 0.0–0.5)
Eosinophils Relative: 0 %
HCT: 27.3 % — ABNORMAL LOW (ref 39.0–52.0)
Hemoglobin: 10.1 g/dL — ABNORMAL LOW (ref 13.0–17.0)
Immature Granulocytes: 1 %
Lymphocytes Relative: 10 %
Lymphs Abs: 0.6 10*3/uL — ABNORMAL LOW (ref 0.7–4.0)
MCH: 34.4 pg — ABNORMAL HIGH (ref 26.0–34.0)
MCHC: 37 g/dL — ABNORMAL HIGH (ref 30.0–36.0)
MCV: 92.9 fL (ref 80.0–100.0)
Monocytes Absolute: 0.5 10*3/uL (ref 0.1–1.0)
Monocytes Relative: 9 %
Neutro Abs: 4.6 10*3/uL (ref 1.7–7.7)
Neutrophils Relative %: 80 %
Platelets: 119 10*3/uL — ABNORMAL LOW (ref 150–400)
RBC: 2.94 MIL/uL — ABNORMAL LOW (ref 4.22–5.81)
RDW: 13.4 % (ref 11.5–15.5)
WBC: 5.8 10*3/uL (ref 4.0–10.5)
nRBC: 0 % (ref 0.0–0.2)

## 2021-09-28 LAB — SODIUM, URINE, RANDOM: Sodium, Ur: 132 mmol/L

## 2021-09-28 LAB — ACTH STIMULATION, 3 TIME POINTS
Cortisol, 30 Min: 8.6 ug/dL
Cortisol, 60 Min: 10.7 ug/dL
Cortisol, Base: 1.9 ug/dL

## 2021-09-28 LAB — MAGNESIUM: Magnesium: 2 mg/dL (ref 1.7–2.4)

## 2021-09-28 LAB — OSMOLALITY, URINE: Osmolality, Ur: 438 mOsm/kg (ref 300–900)

## 2021-09-28 LAB — OSMOLALITY: Osmolality: 273 mOsm/kg — ABNORMAL LOW (ref 275–295)

## 2021-09-28 LAB — SEDIMENTATION RATE: Sed Rate: 5 mm/hr (ref 0–16)

## 2021-09-28 LAB — C-REACTIVE PROTEIN: CRP: 0.6 mg/dL (ref <1.0)

## 2021-09-28 MED ORDER — ENOXAPARIN SODIUM 40 MG/0.4ML IJ SOSY
40.0000 mg | PREFILLED_SYRINGE | Freq: Every day | INTRAMUSCULAR | Status: DC
  Administered 2021-09-29 – 2021-10-03 (×5): 40 mg via SUBCUTANEOUS
  Filled 2021-09-28 (×5): qty 0.4

## 2021-09-28 NOTE — Progress Notes (Addendum)
? ? ?HD#0 ?SUBJECTIVE:  ?Patient Summary: Jaime Cline is a 61 y.o. with a pertinent PMH of COPD and alcohol use disorder, who presented with syncope and admitted for COPD exacerbation.  ? ?Overnight Events: No acute events ?  ?Interm History: Patient seen and evaluated bedside.  States he is feeling well, breathing improved but still coughing up sputum.  No diarrhea.  Eating well no complaints.  Does report some cramping in feet overnight. ? ?OBJECTIVE:  ?Vital Signs: ?Vitals:  ? 09/27/21 2059 09/28/21 0450 09/28/21 0752 09/28/21 0802  ?BP: 116/75 133/87 117/76   ?Pulse: 80 75 94   ?Resp: 18 18 20    ?Temp: 98.5 ?F (36.9 ?C) 98 ?F (36.7 ?C) 98.1 ?F (36.7 ?C)   ?TempSrc: Oral Oral Oral   ?SpO2: 100% 100% 99% 97%  ?Weight:      ?Height:      ? ?Supplemental O2:  ?SpO2: 97 % ? ?Filed Weights  ? 09/26/21 0004 09/27/21 1357  ?Weight: 43.9 kg 45.7 kg  ? ? ? ?Intake/Output Summary (Last 24 hours) at 09/28/2021 1240 ?Last data filed at 09/28/2021 0453 ?Gross per 24 hour  ?Intake --  ?Output 900 ml  ?Net -900 ml  ? ?Net IO Since Admission: 980 mL [09/28/21 1240] ? ?Physical Exam: ?Constitutional: Cachectic appearing and in no distress.  Appears much improved compared to yesterday, alert and more talkative.  Sitting up side of bed eating. ?HENT: Edentulous ?Head: Bitemporal wasting and atraumatic. Tender to palpation over left forehead ?Eyes: EOM are normal.  ?Neck: Normal range of motion.  ?Cardiovascular: Normal rate, regular rhythm, intact distal pulses. No gallop and no friction rub.  ?No murmur heard. No lower extremity edema  ?Pulmonary: Breathing comfortably room air, transmitted upper airway sounds ?Abdominal: Soft. Normal bowel sounds. Non distended and non tender ?Musculoskeletal: Normal range of motion.     ?   General: No tenderness or edema.  ?Neurological: Alert and oriented to person, place, and time.   ?Skin: Skin is warm and dry.  ? ?Patient Lines/Drains/Airways Status   ? ? Active Line/Drains/Airways   ? ?  Name Placement date Placement time Site Days  ? Peripheral IV 08/27/21 24 G Anterior;Proximal;Right Forearm 08/27/21  0847  Forearm  30  ? Peripheral IV 09/26/21 20 G Anterior;Left Forearm 09/26/21  --  Forearm  less than 1  ? Peripheral IV 09/26/21 20 G 1.16" Anterior;Right;Upper Arm 09/26/21  0650  Arm  less than 1  ? ?  ?  ? ?  ? ? ?Pertinent Labs: ? ?  Latest Ref Rng & Units 09/28/2021  ? 12:51 AM 09/27/2021  ?  5:01 AM 09/26/2021  ?  5:20 AM  ?CBC  ?WBC 4.0 - 10.5 K/uL 5.8   5.6     ?Hemoglobin 13.0 - 17.0 g/dL 10.1   9.8   14.3    ?Hematocrit 39.0 - 52.0 % 27.3   26.7   42.0    ?Platelets 150 - 400 K/uL 119   134     ? ? ? ?  Latest Ref Rng & Units 09/28/2021  ?  6:14 AM 09/28/2021  ? 12:51 AM 09/27/2021  ?  6:59 PM  ?CMP  ?Glucose 70 - 99 mg/dL 97   163   164    ?BUN 6 - 20 mg/dL 8   7   6     ?Creatinine 0.61 - 1.24 mg/dL 0.83   0.91   0.98    ?Sodium 135 - 145 mmol/L 130  129   131    ?Potassium 3.5 - 5.1 mmol/L 4.4   4.4   4.1    ?Chloride 98 - 111 mmol/L 95   97   96    ?CO2 22 - 32 mmol/L 27   25   24     ?Calcium 8.9 - 10.3 mg/dL 9.0   8.7   9.0    ?Total Protein 6.5 - 8.1 g/dL  5.3     ?Total Bilirubin 0.3 - 1.2 mg/dL  0.4     ?Alkaline Phos 38 - 126 U/L  54     ?AST 15 - 41 U/L  58     ?ALT 0 - 44 U/L  39     ? ? ?Recent Labs  ?  09/26/21 ?0459  ?GLUCAP 157*  ?  ? ?Pertinent Imaging: ?No results found. ? ?ASSESSMENT/PLAN:  ?Assessment: ?Principal Problem: ?  Syncope and collapse ?Active Problems: ?  Hyponatremia ?  Hypotension due to hypovolemia ?  Protein-calorie malnutrition, severe ? ?Jaime Cline is a 61 year old male with a past medical history of COPD complicated by pulmonary hypertension and right heart dysfunction, hypertension, and gastritis who presents to the ED after a syncopal episode. ? ?#Syncope ?#Hypovolemia ?#Lactic acidosis, resolved ?Syncope on presentation initially believed to be related to hypotension in the setting of acute dehydration.  He was initially bolused with improvement in  blood pressure and resolution of elevated lactic acid.  Orthostatics were checked however this was after several fluid boluses and were negative.  Electrolytes and hypoglycemia suggestive of possible adrenal insufficiency on admission. ?Of note patient recently moved from [place].  During his previous admission records were attempted to be obtained but unable to do so.  We will attempt to follow this up. ?- We will continue to monitor on telemetry. ?- Encourage p.o. intake ? ?Adrenal insufficiency ?Hyponatremia ?Patient sodium noted to worsen to 129 overnight. Also noted to have upper limit of normal potassium and hyponatremia on initial presenting labs, as well as hypoglycemia. Further investigation of hyponatremia with serum osm, urine sodium and urine osm suggestive of potential adrenal insufficiency.  A.m. cortisol was 12.8, neither overt adrenal insufficiency but not adequate to rule it out.  ACTH stim test performed showed low AM cortisol of 1.9 with inadequate response to cosyntropin. ?Patient's blood pressure has been stable since admission, however he has been receiving prednisone for his COPD exacerbation, which may be masking his adrenal insufficiency. ?- ACTH ordered to determine primary vs secondary insufficiency. ?- He will likely need to be started on glucocorticoids as an outpatient. ?- Patient would likely benefit from establishing with an endocrinologist as an outpatient. ?- Lastly patient has underlying lung disease with COPD.  This may be also contributing to his hyponatremia through SIADH.  We have placed him on renal diet with fluid restriction of 1200 cc free water.  Given patient's smoking history concern for possible underlying malignancy, however ESR and CRP have been within normal limits.  We will not obtain chest CT at this time but patient is likely a candidate for low dose chest CT screening. ?-No obvious medication causes for hyponatremia. ?  ?#COPD exacerbation  ?Patient reports  improvement in his overall breathing status.  He is breathing comfortably on room air during evaluation.  He does endorse continued production of white sputum.  Lung sounds clear he does have transmitted upper airway sounds. ?We will continue to treat with albuterol, DuoNebs, prednisone 50 mg, azithromycin.  Today is day 3 of  5 for prednisone. ? ?Hypomagnesemia. ?2.0.  Continue to monitor. ?  ?#AKI ?Renal ultrasound was negative.  This has resolved. ?  ?#Elevation in liver enzymes ?Alcohol use ?Patient had MRI with MRCP to further investigate possible obstructing pancreatic head mass seen on abdominal ultrasound.  This study has returned negative. ?We will continue to monitor. ?   ?#Alcohol Use disorder  ?Patient states he drinks at least a 24 oz beer daily. Markedly elevated ETOH levels on admission and noted to have >2:1 AST/ALT pattern of hepatocellular injury.  ?-TOC c/s for resources on cessation ?-Continue CIWA. ?- Thiamine supplementation.  B12 and folate were checked on last admission and within normal limits. ?  ?#Hypertension ?Patient was hypotensive on admission.  This was in the setting of hypovolemia and continue antihypertensive use. ?-Hold home antihypertensives. ?  ?#Severe protein malnutrition ?Patient cachetic appearing with bitemporal wasting. He has severe AUD and COPD.  ?RD was consulted, appreciate their recommendations. ?Patient had reported poor swallowing contributing to poor oral intake and his malnutrition.  Speech evaluated the patient recommended esophagram.  which showed esophageal dysmotility, and no luminal mass.  He can likely follow-up as an outpatient with GI for manometry and EGD.  Patient is edentulous, and this is likely also contributing to his poor oral intake.  However since admission he has been eating well.  We will discuss with case management about food security as an outpatient. ? ?Signature: ?Delene Ruffini, MD  ?Internal Medicine Resident, PGY-1 ?[facility]  ?Pager: [phone] ?12:40 PM, 09/28/2021  ? ?Please contact the on call pager after 5 pm and on weekends at [phone]. ? ?

## 2021-09-28 NOTE — Progress Notes (Signed)
Speech Language Pathology Treatment: Dysphagia  ?Patient Details ?Name: Jaime Cline ?MRN: [id number] ?DOB: 1961/03/11 ?Today's Date: 09/28/2021 ?Time: 1207-1223 ?SLP Time Calculation (min) (ACUTE ONLY): 16 min ? ?Assessment / Plan / Recommendation ?Clinical Impression ? Pt was seen for dysphagia treatment and he was cooperative throughout the session. Pt, and nursing reported that the pt has been tolerating the current diet without overt s/sx of aspiration. Pt tolerated puree solids, regular texture solids, and thin liquids using consecutive swallows without symptoms of oropharyngeal dysphagia. Pt was educated regarding the results of the esophagram and the potential impact of these contributing to his reported symptoms. Pt was educated regarding swallowing precautions related to COPD and esophageal dysphagia. Literature was provided, pt verbalized understanding, and he reported that he has been observing some of these precautions already. It is recommended that the current diet be continued with observance of swallowing precautions. Appreciate RD's assistance with diet supplementation and weight monitoring. Further acute skilled SLP services are not clinically indicated at this time.  ?  ?HPI HPI: [name] is a 61 year old male who presents to the ED after a syncopal episode with unwitnessed fall and loss of consciousness. Patient was recently hospitalized in early March for COPD exacerbation.  He reports since that time that he has had increased whitish sputum production and worsening shortness of breath. Patient also states that over the last 2 days he has had a poor appetite which is primarily because of his persistent coughing. Head CT 4/10 with no acute findings.  CXR 4/10: "Hyperinflation with emphysema."   Pt with a past medical history of COPD complicated by pulmonary hypertension and right heart dysfunction, hypertension, and gastritis. Esophagram 4/11: Disruption of primary peristaltic waves on almost  all swallows,  with to and fro motion of barium in the esophagus, compatible with  nonspecific esophageal dysmotility disorder. ?  ?   ?SLP Plan ? All goals met;Discharge SLP treatment due to (comment) ? ?  ?  ?Recommendations for follow up therapy are one component of a multi-disciplinary discharge planning process, led by the attending physician.  Recommendations may be updated based on patient status, additional functional criteria and insurance authorization. ?  ? ?Recommendations  ?Diet recommendations: Regular;Thin liquid ?Liquids provided via: Cup;Straw ?Medication Administration: Whole meds with puree (or with liquid was tolerated) ?Supervision: Patient able to self feed;Intermittent supervision to cue for compensatory strategies ?Compensations: Slow rate;Small sips/bites;Follow solids with liquid ?Postural Changes and/or Swallow Maneuvers: Seated upright 90 degrees  ?   ?    ?   ? ? ? ? Oral Care Recommendations: Oral care BID ?Follow Up Recommendations: No SLP follow up ?Assistance recommended at discharge: None ?SLP Visit Diagnosis: Dysphagia, unspecified (R13.10) ?Plan: All goals met;Discharge SLP treatment due to (comment) ? ? ? ? ?  ?  ?Jaime Cline I. Hardin Negus, [place], CCC-SLP ?Acute Rehabilitation Services ?Office number [phone] ?Pager [phone] ? ? ?[name] ? ?09/28/2021, 1:26 PM ? ? ? ? ?

## 2021-09-28 NOTE — Discharge Summary (Signed)
? ?Name: Jaime Cline ?MRN: [id number] ?DOB: 14-Jun-1961 61 y.o. ?PCP: Default, Provider, MD ? ?Date of Admission: 09/25/2021 11:57 PM ?Date of Discharge: 10/03/2021 ?Attending Physician: Dr. Oswaldo Done ? ?Discharge Diagnosis: ?Principal Problem: ?  Syncope and collapse ?Active Problems: ?  Hyponatremia ?  Hypotension due to hypovolemia ?  Protein-calorie malnutrition, severe ?  Syncope ?  ? ?Discharge Medications: ?Allergies as of 10/03/2021   ? ?   Reactions  ? Other Other (See Comments)  ? BP med from [facility] about 3 years ago, caused some seizures, due to getting wrong BP med at that time.  Unknown name of med  ? ?  ? ?  ?Medication List  ?  ? ?STOP taking these medications   ? ?lisinopril 20 MG tablet ?Commonly known as: ZESTRIL ?  ? ?  ? ?TAKE these medications   ? ?albuterol 108 (90 Base) MCG/ACT inhaler ?Commonly known as: VENTOLIN HFA ?Inhale 2 puffs into the lungs every 4 (four) hours as needed for wheezing or shortness of breath. ?  ?atorvastatin 20 MG tablet ?Commonly known as: LIPITOR ?Take 1 tablet (20 mg total) by mouth daily. ?  ?famotidine 20 MG tablet ?Commonly known as: PEPCID ?Take 20 mg by mouth 2 (two) times daily. ?  ?fludrocortisone 0.1 MG tablet ?Commonly known as: FLORINEF ?Take 1 tablet (0.1 mg total) by mouth daily. ?Start taking on: October 04, 2021 ?  ?Fluticasone-Salmeterol 100-50 MCG/DOSE Aepb ?Commonly known as: ADVAIR ?Inhale 1 puff into the lungs 2 (two) times daily. ?  ?hydrocortisone 5 MG tablet ?Commonly known as: CORTEF ?Take 1 tablet (5 mg total) by mouth daily. ?  ?hydrocortisone 10 MG tablet ?Commonly known as: CORTEF ?Take 1 tablet (10 mg total) by mouth daily. ?Start taking on: October 04, 2021 ?  ?hydrOXYzine 10 MG tablet ?Commonly known as: ATARAX ?Take 1 tablet (10 mg total) by mouth every 6 (six) hours as needed for up to 5 days for anxiety. ?  ?sertraline 50 MG tablet ?Commonly known as: ZOLOFT ?Take 50 mg by mouth daily. ?  ? ?  ? ?  ?  ? ? ?  ?Durable Medical Equipment  ?(From  admission, onward)  ?  ? ? ?  ? ?  Start     Ordered  ? 09/27/21 1445  For home use only DME 4 wheeled rolling walker with seat  Once       ?Question:  Patient needs a walker to treat with the following condition  Answer:  Weakness  ? 09/27/21 1444  ? ?  ?  ? ?  ? ? ?Disposition and follow-up:   ?Mr.Jenelle Mages was discharged from [facility] in Stable condition.  At the hospital follow up visit please address: ? ?1.  Follow-up: ? a.  Adrenal insufficiency-patient started on hydrocortisone 10 mg morning dose and 5 mg p.m. dose as well as fludrocortisone 0.1 mg daily.  His sodium has remained stable since initiating these medicines.  Ensures sodium and potassium within normal limits.  Ensure blood pressure normal with no further syncopal episodes.  He would likely benefit from a referral to endocrinologist. ?  ? b.  COPD exacerbation patient breathing at his baseline and comfortably on room air on discharge.  Albuterol and Advair.  He is also given flutter valve for continued sputum secretion. ? ? c.  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 intake for the past month prior to admission.  Patient was also noted to be malnourished.  We will  need to ensure that phosphorus is within normal limits at follow-up.  Ensure patient has adequate food security and nutrition. ? ? d.  Elevated liver enzymes in the setting of alcohol use.  Could consider initiating naltrexone as an outpatient. ? ? E.  Severe protein malnutrition-esophageal dysmotility observed on esophagram.  Patient would likely benefit from outpatient follow-up with GI for manometry and EGD. ? ?2.  Labs / imaging needed at time of follow-up: CMP, mag, Phos, ACTH ? ?3.  Pending labs/ test needing follow-up: None ? ?4.  Medication Changes ? Started: Hydrocortisone 10 mg a.m. dose, 5 mg p.m. dose, fludrocortisone 0.1 mg daily ? Stopped: Lisinopril ? ?Follow-up Appointments: ? Follow-up Information   ? ? [facility] Follow up.   ?Specialty: Rehabilitation ?Contact information: ?[address] ?[id number] mc ?[place] [address] ?[phone] ? ?  ?  ? ? [facility] Follow up in 1 week(s).   ?Contact information: ?[address] ?[place] [address] ?[phone] ? ?  ?  ? ?  ?  ? ?  ? ? ?Hospital Course by problem list:  ?Patient presented with with complaints of fall and was noted to have hyponatremia, hypotension and borderline elevated potassium.  CT scan was obtained which showed no acute intracranial process.  Urine studies were obtained given his hyponatremia suggestive of either SIADH or adrenal insufficiency.  A a.m. cortisol and ACTH test were obtained which were indicative of adrenal insufficiency, however ACTH study was obtained within a week of patient receiving prednisone for his COPD exacerbation as well.  [facility] was consulted with recommendations for repeating ACTH studies as an outpatient once patient was off prednisone.  Patient was started on fludrocortisone and hydrocortisone and his sodium and potassium were monitored.  Electrolytes showed stability after initiation of these medicines.  Given his hypotension on presentation and fall his lisinopril was discontinued.  It was recommended to him that he follow-up with the [facility] to establish PCP here in [place].  He would also likely benefit from establishing with an endocrinologist. ? ?Refeeding syndrome. ?Patient reported dysphagia for approximately 1 month prior to presentation.  States that he had been eating well beforehand and denied any food insecurity.  He was evaluated by SLP who recommended a swallow study which he underwent and which showed esophageal dysphagia.  Patient reported after this exam that his swallowing appeared to be improved and he was able to tolerate full diet.  Phosphorus was monitored throughout this and required frequent repletion.   Vitamin D levels were checked to rule out low vitamin D as an underlying etiology.  Is also help to assess his urine for phosphate losses, however this test was not able to be collected during his stay.  He was advised to eat more frequent but smaller meals.  Phosphate showed stability prior to discharge. ? ?COPD exacerbation. ?Patient presented with complaints of shortness of breath increased bleeding.  He was treated with a course of azithromycin and prednisone.  He was also given DuoNebs and albuterol.  His breathing remained stable and he was able to ambulate comfortably on discharge without limitation.  He did report continued sputum production and was provided with a flutter valve to assist with this. ? ?AKI ?Patient noted to have AKI on presentation likely related to hypotension.  He received IV fluids.  Renal ultrasound was performed and negative.  Renal function improved with fluids. ? ?Alcohol use disorder. ?Patient was noted to have significant  alcohol level on admission.  He was started on thiamine, B12 folate.  He was also monitored with CIWA but showed no signs of withdrawal and did not require Ativan.  CIWA was discontinued but vitamin supplementation was continued throughout his hospitalization. ?  ? ?Discharge Subjective: ?Patient reporting he is breathing well.  Though he does express frustration that he still has not received his flutter valve.  He does report some anxiety.  No other complaints at this time. ? ?Discharge Exam:   ?BP (!) 131/92 (BP Location: Right Arm)   Pulse (!) 101   Temp 97.6 ?F (36.4 ?C) (Oral)   Resp 17   Ht 6' (1.829 m)   Wt 45.7 kg Comment: bed weight  SpO2 100%   BMI 13.66 kg/m?  ?Constitutional: Cachectic appearing ?HENT: normocephalic atraumatic, edentulous ?Eyes: conjunctiva non-erythematous ?Neck: supple ?Cardiovascular: regular rate and rhythm, no m/r/g ?Pulmonary/Chest: normal work of breathing on room air, lungs clear to auscultation bilaterally ?MSK:  Decreased muscle mass ?Neurological: alert & oriented x 3 ?Skin: warm and dry ?Psych: Normal mood and thought process ? ?Pertinent Labs, Studies, and Procedures:  ? ?  Latest Ref Rng & Units 09/28/2021  ? 12:51 AM 09/27/2021  ?  5:01 AM

## 2021-09-28 NOTE — Progress Notes (Signed)
Physical Therapy Treatment ?Patient Details ?Name: Jaime Cline ?MRN: [id number] ?DOB: 1960-06-21 ?Today's Date: 09/28/2021 ? ? ?History of Present Illness Pt is a 61 y/o male admitted secondary to syncopal episode. PMH includes COPD, pulmonary HTN, alcohol abuse, and HTN. ? ?  ?PT Comments  ? ? Patient progressing and able to demonstrate safe technique on steps with basically S assist.  Patient with new rollator delivered to the room and educated in safety with brakes locked prior to utilizing seat.  Also adjusted height of walker for pt's height.  Feel stable to ambulate with nursing and mobility.  No further skilled PT needs as goals met.  Also do not feel follow up PT needed at this time.   ?Recommendations for follow up therapy are one component of a multi-disciplinary discharge planning process, led by the attending physician.  Recommendations may be updated based on patient status, additional functional criteria and insurance authorization. ? ?Follow Up Recommendations ? No PT follow up ?  ?  ?Assistance Recommended at Discharge Intermittent Supervision/Assistance  ?Patient can return home with the following A little help with walking and/or transfers ?  ?Equipment Recommendations ? Rolling walker (2 wheels)  ?  ?Recommendations for Other Services   ? ? ?  ?Precautions / Restrictions Precautions ?Precautions: Fall  ?  ? ?Mobility ? Bed Mobility ?Overal bed mobility: Modified Independent ?  ?  ?  ?  ?  ?  ?  ?  ? ?Transfers ?Overall transfer level: Modified independent ?Equipment used: Rollator (4 wheels) ?Transfers: Sit to/from Stand ?Sit to Stand: Supervision ?  ?  ?  ?  ?  ?General transfer comment: cues for safety with rollator ?  ? ?Ambulation/Gait ?Ambulation/Gait assistance: Supervision ?Gait Distance (Feet): 300 Feet ?Assistive device: Rollator (4 wheels) ?Gait Pattern/deviations: Step-through pattern, Decreased stride length ?  ?  ?  ?General Gait Details: adjusted walker that had been delivered to his  room for height and education about brakes performed.  Patient wearing knee sleeve that had falled to his ankle while walking with mobility tech, impulsively placed his foot on seat of rollator without locking brakes to adjust his brace, needing assist for safety ? ? ?Stairs ?Stairs: Yes ?Stairs assistance: Min guard, Supervision ?Stair Management: One rail Right, Step to pattern, Alternating pattern, Forwards ?Number of Stairs: 10 ?General stair comments: able to negotiate with one rail and minguard progressing to S for safety, step to for descent and step through for ascending steps ? ? ?Wheelchair Mobility ?  ? ?Modified Rankin (Stroke Patients Only) ?  ? ? ?  ?Balance Overall balance assessment: Needs assistance ?Sitting-balance support: No upper extremity supported ?Sitting balance-Leahy Scale: Good ?  ?  ?  ?Standing balance-Leahy Scale: Good ?Standing balance comment: walking in room without walker to bed ?  ?  ?  ?  ?  ?  ?  ?  ?  ?  ?  ?  ? ?  ?Cognition Arousal/Alertness: Awake/alert ?Behavior During Therapy: [facility] for tasks assessed/performed ?Overall Cognitive Status: No family/caregiver present to determine baseline cognitive functioning ?  ?  ?  ?  ?  ?  ?  ?  ?  ?  ?  ?  ?  ?  ?  ?  ?  ?  ?  ? ?  ?Exercises   ? ?  ?General Comments   ?  ?  ? ?Pertinent Vitals/Pain Pain Assessment ?Pain Assessment: No/denies pain  ? ? ?Home Living   ?  ?  ?  ?  ?  ?  ?  ?  ?  ?   ?  ?  Prior Function    ?  ?  ?   ? ?PT Goals (current goals can now be found in the care plan section) Progress towards PT goals: Goals met/education completed, patient discharged from PT ? ?  ?Frequency ? ? ? Min 3X/week ? ? ? ?  ?PT Plan Discharge plan needs to be updated  ? ? ?Co-evaluation   ?  ?  ?  ?  ? ?  ?AM-PAC PT "6 Clicks" Mobility   ?Outcome Measure ? Help needed turning from your back to your side while in a flat bed without using bedrails?: None ?Help needed moving from lying on your back to sitting on the side of a flat bed without  using bedrails?: None ?Help needed moving to and from a bed to a chair (including a wheelchair)?: None ?Help needed standing up from a chair using your arms (e.g., wheelchair or bedside chair)?: None ?Help needed to walk in hospital room?: None ?Help needed climbing 3-5 steps with a railing? : A Little ?6 Click Score: 23 ? ?  ?End of Session Equipment Utilized During Treatment: Gait belt ?Activity Tolerance: Patient tolerated treatment well ?Patient left: in bed;with call bell/phone within reach ?  ?PT Visit Diagnosis: Unsteadiness on feet (R26.81);Muscle weakness (generalized) (M62.81) ?  ? ? ?Time: 2446-2863 ?PT Time Calculation (min) (ACUTE ONLY): 15 min ? ?Charges:  $Gait Training: 8-22 mins          ?          ? ?Magda Kiel, PT ?Acute Rehabilitation Services ?OTRRN:[phone] ?[phone] ?09/28/2021 ? ? ? ?[name] ?09/28/2021, 5:10 PM ? ?

## 2021-09-28 NOTE — Progress Notes (Signed)
Mobility Specialist: Progress Note ? ? 09/28/21 1558  ?Mobility  ?Activity Ambulated with assistance in hallway  ?Level of Assistance Modified independent, requires aide device or extra time  ?Assistive Device Four wheel walker  ?Distance Ambulated (ft) 550 ft  ?Activity Response Tolerated well  ?$Mobility charge 1 Mobility  ? ?Received pt in bed having no complaints and agreeable to mobility. Asymptomatic throughout ambulation, sitting EOB w/ call bell in reach and all needs met. ? ?Jaime Cline ?Mobility Specialist ?Mobility Specialist [place]: [phone] ?Mobility Specialist [place]: [phone] ? ?

## 2021-09-29 LAB — BASIC METABOLIC PANEL
Anion gap: 10 (ref 5–15)
BUN: 16 mg/dL (ref 6–20)
CO2: 28 mmol/L (ref 22–32)
Calcium: 8.9 mg/dL (ref 8.9–10.3)
Chloride: 88 mmol/L — ABNORMAL LOW (ref 98–111)
Creatinine, Ser: 0.94 mg/dL (ref 0.61–1.24)
GFR, Estimated: >60 mL/min (ref >60)
Glucose, Bld: 137 mg/dL — ABNORMAL HIGH (ref 70–99)
Potassium: 4.3 mmol/L (ref 3.5–5.1)
Sodium: 126 mmol/L — ABNORMAL LOW (ref 135–145)

## 2021-09-29 LAB — RENAL FUNCTION PANEL
Albumin: 3.3 g/dL — ABNORMAL LOW (ref 3.5–5.0)
Anion gap: 6 (ref 5–15)
BUN: 18 mg/dL (ref 6–20)
CO2: 29 mmol/L (ref 22–32)
Calcium: 9.3 mg/dL (ref 8.9–10.3)
Chloride: 95 mmol/L — ABNORMAL LOW (ref 98–111)
Creatinine, Ser: 0.96 mg/dL (ref 0.61–1.24)
GFR, Estimated: >60 mL/min (ref >60)
Glucose, Bld: 114 mg/dL — ABNORMAL HIGH (ref 70–99)
Phosphorus: <1 mg/dL — CL (ref 2.5–4.6)
Potassium: 4.6 mmol/L (ref 3.5–5.1)
Sodium: 130 mmol/L — ABNORMAL LOW (ref 135–145)

## 2021-09-29 LAB — ACTH: C206 ACTH: <1.5 pg/mL — ABNORMAL LOW (ref 7.2–63.3)

## 2021-09-29 LAB — PHOSPHORUS
Phosphorus: 4.1 mg/dL (ref 2.5–4.6)
Phosphorus: 2.9 mg/dL (ref 2.5–4.6)

## 2021-09-29 MED ORDER — HYDROCORTISONE 10 MG PO TABS
10.0000 mg | ORAL_TABLET | Freq: Every day | ORAL | Status: DC
  Administered 2021-09-29 – 2021-10-03 (×5): 10 mg via ORAL
  Filled 2021-09-29 (×5): qty 1

## 2021-09-29 MED ORDER — SODIUM PHOSPHATES 45 MMOLE/15ML IV SOLN
30.0000 mmol | Freq: Once | INTRAVENOUS | Status: AC
Start: 2021-09-29 — End: 2021-09-29
  Administered 2021-09-29: 30 mmol via INTRAVENOUS
  Filled 2021-09-29: qty 10

## 2021-09-29 MED ORDER — FLUDROCORTISONE ACETATE 0.1 MG PO TABS
0.1000 mg | ORAL_TABLET | Freq: Every day | ORAL | Status: DC
  Administered 2021-09-29 – 2021-10-03 (×5): 0.1 mg via ORAL
  Filled 2021-09-29 (×5): qty 1

## 2021-09-29 MED ORDER — HYDROCORTISONE 5 MG PO TABS
5.0000 mg | ORAL_TABLET | ORAL | Status: DC
  Administered 2021-09-29 – 2021-10-02 (×4): 5 mg via ORAL
  Filled 2021-09-29 (×5): qty 1

## 2021-09-29 NOTE — Progress Notes (Signed)
Patient refused bed alarm. Falls education done, he verbalizes understanding ?

## 2021-09-29 NOTE — Progress Notes (Signed)
Mobility Specialist: Progress Note ? ? 09/29/21 1423  ?Mobility  ?Activity Ambulated with assistance in hallway  ?Level of Assistance Modified independent, requires aide device or extra time  ?Assistive Device Four wheel walker  ?Distance Ambulated (ft) 1650 ft  ?Activity Response Tolerated well  ?$Mobility charge 1 Mobility  ? ?Received pt in bed having no complaints and agreeable to mobility. Asymptomatic throughout ambulation, returned back to chair w/ call bell in reach and all needs met. ? ?Jaime Cline ?Mobility Specialist ?Mobility Specialist [place]: [phone] ?Mobility Specialist [place]: [phone] ? ?

## 2021-09-29 NOTE — Progress Notes (Signed)
Mobility Specialist Progress Note: ? ? 09/29/21 1108  ?Mobility  ?Activity Ambulated with assistance in hallway  ?Level of Assistance Modified independent, requires aide device or extra time  ?Assistive Device Four wheel walker  ?Distance Ambulated (ft) 570 ft  ?Activity Response Tolerated well  ?$Mobility charge 1 Mobility  ? ?Pt received coming out of bathroom. No complaints of pain. Left EOB with call bell in reach and all needs met.  ? ?Jaime Cline ?Mobility Specialist ?Primary Phone [phone] ? ?

## 2021-09-29 NOTE — Progress Notes (Addendum)
? ? ?HD#1 ?SUBJECTIVE:  ?Patient Summary: Jaime Cline is a 61 y.o. with a pertinent PMH of COPD and alcohol use disorder, who presented with syncope and admitted for COPD exacerbation.  ? ?Overnight Events: No acute events ?  ?Interm History: Patient seen and evaluated bedside. Feels well. Still coughing up white phlegm. Was able to ambulate without dyspnea. Reports he does have access to food at home but wasn't eating much prior to admission due to one month of trouble swallowing. Reports dysphagia has resolved now.  ? ?OBJECTIVE:  ?Vital Signs: ?Vitals:  ? 09/29/21 0628 09/29/21 0759 09/29/21 0825 09/29/21 0832  ?BP: 92/66 (!) 87/55 96/63   ?Pulse: 75 92 95   ?Resp: 18 17 16    ?Temp: 98.4 ?F (36.9 ?C) 98.7 ?F (37.1 ?C) 98.4 ?F (36.9 ?C)   ?TempSrc: Oral Oral Oral   ?SpO2: 100% 98% 99% 100%  ?Weight:      ?Height:      ? ?Supplemental O2:  ?SpO2: 100 % ? ?Filed Weights  ? 09/26/21 0004 09/27/21 1357  ?Weight: 43.9 kg 45.7 kg  ? ? ? ?Intake/Output Summary (Last 24 hours) at 09/29/2021 1320 ?Last data filed at 09/29/2021 1316 ?Gross per 24 hour  ?Intake 460 ml  ?Output 400 ml  ?Net 60 ml  ? ?Net IO Since Admission: 1,040 mL [09/29/21 1320] ? ?Physical Exam: ?Constitutional: Cachectic appearing and in no distress. Alert and more talkative. ?HENT: Edentulous ?Head: Bitemporal wasting ?Eyes: EOM are normal.  ?Neck: Normal range of motion.  ?Cardiovascular: Normal rate, regular rhythm, intact distal pulses. No gallop and no friction rub.  ?No murmur heard. No lower extremity edema  ?Pulmonary: Breathing comfortably room air, transmitted upper airway sounds ?Abdominal: Soft. Normal bowel sounds. Non distended and non tender ?Musculoskeletal: Normal range of motion.     ?   General: No tenderness or edema.  ?Neurological: Alert and oriented to person, place, and time.   ?Skin: Skin is warm and dry.  ? ?Patient Lines/Drains/Airways Status   ? ? Active Line/Drains/Airways   ? ? Name Placement date Placement time Site Days  ?  Peripheral IV 08/27/21 24 G Anterior;Proximal;Right Forearm 08/27/21  0847  Forearm  30  ? Peripheral IV 09/26/21 20 G Anterior;Left Forearm 09/26/21  --  Forearm  less than 1  ? Peripheral IV 09/26/21 20 G 1.16" Anterior;Right;Upper Arm 09/26/21  0650  Arm  less than 1  ? ?  ?  ? ?  ? ? ?Pertinent Labs: ? ?  Latest Ref Rng & Units 09/28/2021  ? 12:51 AM 09/27/2021  ?  5:01 AM 09/26/2021  ?  5:20 AM  ?CBC  ?WBC 4.0 - 10.5 K/uL 5.8   5.6     ?Hemoglobin 13.0 - 17.0 g/dL 11/26/2021   9.8   33.2    ?Hematocrit 39.0 - 52.0 % 27.3   26.7   42.0    ?Platelets 150 - 400 K/uL 119   134     ? ? ? ?  Latest Ref Rng & Units 09/29/2021  ?  1:07 AM 09/28/2021  ?  6:35 PM 09/28/2021  ?  4:35 PM  ?CMP  ?Glucose 70 - 99 mg/dL 11/28/2021   884   166    ?BUN 6 - 20 mg/dL 18   13   13     ?Creatinine 0.61 - 1.24 mg/dL 063     0.16    ?Sodium 135 - 145 mmol/L 130   128   128    ?  Potassium 3.5 - 5.1 mmol/L 4.6   4.6   4.6    ?Chloride 98 - 111 mmol/L 95   94   93    ?CO2 22 - 32 mmol/L 29   24   25     ?Calcium 8.9 - 10.3 mg/dL 9.3   9.1   9.0    ? ? ?No results for input(s): GLUCAP in the last 72 hours. ?  ? ?Pertinent Imaging: ?No results found. ? ?ASSESSMENT/PLAN:  ?Assessment: ?Principal Problem: ?  Syncope and collapse ?Active Problems: ?  Hyponatremia ?  Hypotension due to hypovolemia ?  Protein-calorie malnutrition, severe ?  Syncope ? ?Jaime Cline is a 61 year old male with a past medical history of COPD complicated by pulmonary hypertension and right heart dysfunction, hypertension, and gastritis who presents to the ED after a syncopal episode. ? ?Adrenal insufficiency ?Hyponatremia ?Hypotension ?We are treating as adrenal insufficiency. Given patient's hypotension on admission, and hypotension overnight with fluid restriction, we have liberalized his diet to regular without fluid restriction.  Less concern for SIADH at this time.  Feel his hyponatremia is most likely related to his underlying adrenal insufficiency.  We have discontinued  his prednisone that we are treating his COPD exacerbation with as his breathing has remained stable.  We have started him on Hydrocortisone 10mg  AM and 5mg  PM.  To help with electrolyte derangements we have also started him on fludrocortisone 0.1 mg daily. ?- orthostatics checked and negative ?- PM BMP ?- We will need to continue to monitor his blood pressure, sodium, potassium, glucose in the setting of new glucocorticoid regimen.  He will need to be on a stable regimen prior to discharge. ?  ?#COPD exacerbation  ?Patient reports improvement in his overall breathing status.  He is breathing comfortably on room air during evaluation.  He does endorse continued production of white sputum.  Lung sounds clear he does have transmitted upper airway sounds. ?-Azithromycin and prednisone has been discontinued.  He is breathing well and able to ambulate comfortably without any dyspnea.  He will be receiving steroids for adrenal insufficiency. ?- Patient reporting continued white sputum production but is otherwise afebrile.  We given him a flutter valve. ? ?#Severe protein malnutrition ?Hypophosphatemia ?Patient cachetic appearing with bitemporal wasting. He has AUD and COPD.  ?RD was consulted, appreciate their recommendations. ?Patient had reported poor swallowing contributing to poor oral intake and his malnutrition.  Speech evaluated the patient recommended esophagram.  which showed esophageal dysmotility, and no luminal mass.  He can likely follow-up as an outpatient with GI for manometry and EGD.  Patient is edentulous, and this is likely also contributing to his poor oral intake.  However since admission he has been eating well. ?Patient's phosphate is less than 1.0 this morning he was repleted.  Suspect possible refeeding syndrome given patient's poor oral intake prior to admission.  We will continue to monitor this and keep her repleted.  Most recently 4.1 ? ?Hypomagnesemia. ?2.0.  Continue to monitor. ?   ?#AKI ?Renal ultrasound was negative.  This has resolved. ?  ?#Elevation in liver enzymes ?Alcohol use ?Patient had MRI with MRCP to further investigate possible obstructing pancreatic head mass seen on abdominal ultrasound.  This study has returned negative. ?We will continue to monitor. ?   ?#Alcohol Use disorder  ?Patient states he drinks at least a 24 oz beer daily. Markedly elevated ETOH levels on admission and noted to have >2:1 AST/ALT pattern of hepatocellular injury, improved. ?-TOC c/s for  resources on cessation ?-CIWA discontinued ?- Thiamine supplementation.  B12 and folate were checked on last admission and within normal limits. ?  ?#Hypertension ?Patient was hypotensive on admission.  This was in the setting of hypovolemia and continue antihypertensive use. ?-Hold home antihypertensives. ?  ? ?Signature: ?Adron BeneGreylon Cailee Blanke, MD  ?Internal Medicine Resident, PGY-1 ?[facility]  ?Pager: [phone] ?1:20 PM, 09/29/2021  ? ?Please contact the on call pager after 5 pm and on weekends at [phone]. ? ?

## 2021-09-29 NOTE — Plan of Care (Signed)
  Problem: Pain Managment: Goal: General experience of comfort will improve Outcome: Progressing   Problem: Safety: Goal: Ability to remain free from injury will improve Outcome: Progressing   Problem: Skin Integrity: Goal: Risk for impaired skin integrity will decrease Outcome: Progressing   

## 2021-09-29 NOTE — Progress Notes (Signed)
Occupational Therapy Treatment and Discharge ?Patient Details ?Name: Jaime Cline ?MRN: [id number] ?DOB: [birth year] ?Today's Date: 09/29/2021 ? ? ?History of present illness Pt is a 61 y/o male admitted secondary to syncopal episode. PMH includes COPD, pulmonary HTN, alcohol abuse, and HTN. ?  ?OT comments ? Pt educated in energy conservation strategies. Able to state 3 at end of session and demonstrate pursed lip breathing techniques. Pt demonstrated ability to use rollator safely. Instructed in multiple benefits of rollator and how to collapse to transport. He is functioning modified independently in ADLs. No further OT needs.   ? ?Recommendations for follow up therapy are one component of a multi-disciplinary discharge planning process, led by the attending physician.  Recommendations may be updated based on patient status, additional functional criteria and insurance authorization. ?   ?Follow Up Recommendations ? No OT follow up  ?  ?Assistance Recommended at Discharge Intermittent Supervision/Assistance  ?Patient can return home with the following ? A little help with bathing/dressing/bathroom;Assistance with cooking/housework;Assist for transportation;Help with stairs or ramp for entrance ?  ?Equipment Recommendations ? Other (comment) (rollator)  ?  ?Recommendations for Other Services   ? ?  ?Precautions / Restrictions Precautions ?Precautions: None ?Restrictions ?Weight Bearing Restrictions: No  ? ? ?  ? ?Mobility Bed Mobility ?Overal bed mobility: Modified Independent ?  ?  ?  ?  ?  ?  ?General bed mobility comments: HOB up ?  ? ?Transfers ?Overall transfer level: Modified independent ?Equipment used: Rollator (4 wheels) ?  ?  ?  ?  ?  ?  ?  ?  ?  ?  ?Balance   ?  ?Sitting balance-Leahy Scale: Good ?  ?  ?Standing balance support: No upper extremity supported ?Standing balance-Leahy Scale: Good ?  ?  ?  ?  ?  ?  ?  ?  ?  ?  ?  ?  ?   ? ?ADL either performed or assessed with clinical judgement  ? ?ADL Overall  ADL's : Modified independent ?  ?  ?  ?  ?  ?  ?  ?  ?  ?  ?  ?  ?  ?  ?  ?  ?  ?  ?  ?General ADL Comments: Educated pt in energy conservation strategies, pursed lip breathing and reinforced with written handout. ?  ? ?Extremity/Trunk Assessment   ?  ?  ?  ?  ?  ? ?Vision   ?  ?  ?Perception   ?  ?Praxis   ?  ? ?Cognition Arousal/Alertness: Awake/alert ?Behavior During Therapy: [facility] for tasks assessed/performed ?Overall Cognitive Status: No family/caregiver present to determine baseline cognitive functioning ?  ?  ?  ?  ?  ?  ?  ?  ?  ?  ?  ?  ?  ?  ?  ?  ?  ?  ?  ?   ?Exercises   ? ?  ?Shoulder Instructions   ? ? ?  ?General Comments    ? ? ?Pertinent Vitals/ Pain       Pain Assessment ?Pain Assessment: No/denies pain ? ?Home Living   ?  ?  ?  ?  ?  ?  ?  ?  ?  ?  ?  ?  ?  ?  ?  ?  ?  ?  ? ?  ?Prior Functioning/Environment    ?  ?  ?  ?   ? ?Frequency ? Min 2X/week  ? ? ? ? ?  ?  Progress Toward Goals ? ?OT Goals(current goals can now be found in the care plan section) ? Progress towards OT goals: Goals met/education completed, patient discharged from OT ? ?   ?Plan All goals met and education completed, patient discharged from OT services   ? ?Co-evaluation ? ? ?   ?  ?  ?  ?  ? ?  ?AM-PAC OT "6 Clicks" Daily Activity     ?Outcome Measure ? ? Help from another person eating meals?: None ?Help from another person taking care of personal grooming?: None ?Help from another person toileting, which includes using toliet, bedpan, or urinal?: None ?Help from another person bathing (including washing, rinsing, drying)?: None ?Help from another person to put on and taking off regular upper body clothing?: None ?Help from another person to put on and taking off regular lower body clothing?: None ?6 Click Score: 24 ? ?  ?End of Session Equipment Utilized During Treatment: Rollator (4 wheels) ? ?OT Visit Diagnosis: Other (comment) (decreased activity tolerance) ?  ?Activity Tolerance Patient tolerated treatment well ?  ?Patient  Left in bed;with call bell/phone within reach;with bed alarm set ?  ?Nurse Communication   ?  ? ?   ? ?Time: [phone] ?OT Time Calculation (min): 15 min ? ?Charges: OT General Charges ?$OT Visit: 1 Visit ?OT Treatments ?$Self Care/Home Management : 8-22 mins ? ?Nestor Lewandowsky, OTR/L ?Acute Rehabilitation Services ?Pager: [phone] ?Office: [phone]  ? ?Malka So ?09/29/2021, 9:07 AM ?

## 2021-09-30 LAB — BASIC METABOLIC PANEL
Anion gap: 8 (ref 5–15)
Anion gap: 13 (ref 5–15)
BUN: 17 mg/dL (ref 6–20)
BUN: 19 mg/dL (ref 6–20)
CO2: 27 mmol/L (ref 22–32)
CO2: 24 mmol/L (ref 22–32)
Calcium: 9 mg/dL (ref 8.9–10.3)
Calcium: 9.7 mg/dL (ref 8.9–10.3)
Chloride: 92 mmol/L — ABNORMAL LOW (ref 98–111)
Chloride: 94 mmol/L — ABNORMAL LOW (ref 98–111)
Creatinine, Ser: 1.05 mg/dL (ref 0.61–1.24)
Creatinine, Ser: 0.96 mg/dL (ref 0.61–1.24)
GFR, Estimated: >60 mL/min (ref >60)
GFR, Estimated: >60 mL/min (ref >60)
Glucose, Bld: 111 mg/dL — ABNORMAL HIGH (ref 70–99)
Glucose, Bld: 98 mg/dL (ref 70–99)
Potassium: 4.6 mmol/L (ref 3.5–5.1)
Potassium: 4.1 mmol/L (ref 3.5–5.1)
Sodium: 127 mmol/L — ABNORMAL LOW (ref 135–145)
Sodium: 131 mmol/L — ABNORMAL LOW (ref 135–145)

## 2021-09-30 LAB — NA AND K (SODIUM & POTASSIUM), RAND UR
Potassium Urine: 53 mmol/L
Sodium, Ur: 26 mmol/L

## 2021-09-30 LAB — OSMOLALITY, URINE: Osmolality, Ur: 579 mOsm/kg (ref 300–900)

## 2021-09-30 LAB — SODIUM: Sodium: 130 mmol/L — ABNORMAL LOW (ref 135–145)

## 2021-09-30 LAB — MAGNESIUM: Magnesium: 1.6 mg/dL — ABNORMAL LOW (ref 1.7–2.4)

## 2021-09-30 LAB — OSMOLALITY: Osmolality: 280 mOsm/kg (ref 275–295)

## 2021-09-30 LAB — PHOSPHORUS: Phosphorus: 2.1 mg/dL — ABNORMAL LOW (ref 2.5–4.6)

## 2021-09-30 MED ORDER — HYDROXYZINE HCL 10 MG PO TABS: 10.0000 mg | ORAL_TABLET | Freq: Four times a day (QID) | ORAL | Status: DC | PRN

## 2021-09-30 MED ORDER — K PHOS MONO-SOD PHOS DI & MONO 155-852-130 MG PO TABS
500.0000 mg | ORAL_TABLET | Freq: Once | ORAL | Status: AC
  Administered 2021-09-30: 500 mg via ORAL
  Filled 2021-09-30: qty 2

## 2021-09-30 MED ORDER — MAGNESIUM SULFATE 2 GM/50ML IV SOLN
2.0000 g | Freq: Once | INTRAVENOUS | Status: AC
  Administered 2021-09-30: 2 g via INTRAVENOUS
  Filled 2021-09-30: qty 50

## 2021-09-30 NOTE — Progress Notes (Signed)
? ? ?HD#2 ?SUBJECTIVE:  ?Patient Summary: Jaime MagesLevi D Cline is a 61 y.o. with a pertinent PMH of COPD and alcohol use disorder, who presented with syncope and admitted for COPD exacerbation.  ? ?Overnight Events: No acute events ?  ?Interm History: Patient seen and evaluated bedside.  Feels well still coughing up phlegm.  Reports feeling anxious last night due to anniversary of family deaths.  He is feeling better this morning.  No complaints ? ?OBJECTIVE:  ?Vital Signs: ?Vitals:  ? 09/30/21 0509 09/30/21 0924 09/30/21 1104 09/30/21 1105  ?BP: 116/78 99/69 103/75   ?Pulse: 84 (!) 101 (!) 111   ?Resp: 18 16 18    ?Temp: 98 ?F (36.7 ?C) 98.7 ?F (37.1 ?C) 98.2 ?F (36.8 ?C)   ?TempSrc: Oral Oral Oral   ?SpO2: 100% 98% 100% 98%  ?Weight:      ?Height:      ? ?Supplemental O2:  ?SpO2: 98 % ? ?Filed Weights  ? 09/26/21 0004 09/27/21 1357  ?Weight: 43.9 kg 45.7 kg  ? ? ? ?Intake/Output Summary (Last 24 hours) at 09/30/2021 1757 ?Last data filed at 09/30/2021 1654 ?Gross per 24 hour  ?Intake 300 ml  ?Output 1150 ml  ?Net -850 ml  ? ?Net IO Since Admission: 190 mL [09/30/21 1757] ? ?Physical Exam: ?Constitutional: Cachectic appearing and in no distress. Alert and more talkative. ?HENT: Edentulous ?Head: Bitemporal wasting ?Eyes: EOM are normal.  ?Neck: Normal range of motion.  ?Cardiovascular: Normal rate, regular rhythm, intact distal pulses. No gallop and no friction rub.  ?No murmur heard. No lower extremity edema  ?Pulmonary: Breathing comfortably room air, transmitted upper airway sounds ?Abdominal: Soft. Normal bowel sounds. Non distended and non tender ?Musculoskeletal: Normal range of motion.     ?   General: No tenderness or edema.  ?Neurological: Alert and oriented to person, place, and time.   ?Skin: Skin is warm and dry.  ? ?Patient Lines/Drains/Airways Status   ? ? Active Line/Drains/Airways   ? ? Name Placement date Placement time Site Days  ? Peripheral IV 08/27/21 24 G Anterior;Proximal;Right Forearm 08/27/21  0847   Forearm  30  ? Peripheral IV 09/26/21 20 G Anterior;Left Forearm 09/26/21  --  Forearm  less than 1  ? Peripheral IV 09/26/21 20 G 1.16" Anterior;Right;Upper Arm 09/26/21  0650  Arm  less than 1  ? ?  ?  ? ?  ? ? ?Pertinent Labs: ? ?  Latest Ref Rng & Units 09/28/2021  ? 12:51 AM 09/27/2021  ?  5:01 AM 09/26/2021  ?  5:20 AM  ?CBC  ?WBC 4.0 - 10.5 K/uL 5.8   5.6     ?Hemoglobin 13.0 - 17.0 g/dL 16.110.1   9.8   09.614.3    ?Hematocrit 39.0 - 52.0 % 27.3   26.7   42.0    ?Platelets 150 - 400 K/uL 119   134     ? ? ? ?  Latest Ref Rng & Units 09/30/2021  ? 11:34 AM 09/30/2021  ?  1:15 AM 09/29/2021  ?  6:15 PM  ?CMP  ?Glucose 70 - 99 mg/dL 98   045111   409137    ?BUN 6 - 20 mg/dL 19   17   16     ?Creatinine 0.61 - 1.24 mg/dL 8.110.96   9.141.05   7.820.94    ?Sodium 135 - 145 mmol/L 130    ? 131   127   126    ?Potassium 3.5 - 5.1 mmol/L  4.1   4.6   4.3    ?Chloride 98 - 111 mmol/L 94   92   88    ?CO2 22 - 32 mmol/L 24   27   28     ?Calcium 8.9 - 10.3 mg/dL 9.7   9.0   8.9    ? ? ?No results for input(s): GLUCAP in the last 72 hours. ?  ? ?Pertinent Imaging: ?No results found. ? ?ASSESSMENT/PLAN:  ?Assessment: ?Principal Problem: ?  Syncope and collapse ?Active Problems: ?  Hyponatremia ?  Hypotension due to hypovolemia ?  Protein-calorie malnutrition, severe ?  Syncope ? ?[name] is a 61 year old male with a past medical history of COPD complicated by pulmonary hypertension and right heart dysfunction, hypertension, and gastritis who presents to the ED after a syncopal episode. ? ?Adrenal insufficiency ?Hyponatremia ?Hypotension ?We are treating as adrenal insufficiency.  Patient was started on fludrocortisone and hydrocortisone yesterday diet was liberalized.  Blood pressures have been stable, modest improvement in his hyponatremia noted this afternoon with sodium 131.  Sodium requiring further improvement.  We have consulted [facility] for further assistance in titrating glucocorticoids. ?- We will need to continue to monitor his blood  pressure, sodium, potassium, glucose in the setting of new glucocorticoid regimen.  He will need to be on a stable regimen prior to discharge. ?- daily bmp ?  ?#COPD exacerbation  ?Patient reports improvement in his overall breathing status.  He is breathing comfortably on room air during evaluation.  He does endorse continued production of white sputum.  Lung sounds clear he does have transmitted upper airway sounds. ?-Azithromycin and prednisone has been discontinued.  He is breathing well and able to ambulate comfortably without any dyspnea.  He will be receiving steroids for adrenal insufficiency. ?- Patient reporting continued white sputum production but is otherwise afebrile.  We given him a flutter valve. ? ?#Severe protein malnutrition ?Hypophosphatemia ?Patient cachetic appearing with bitemporal wasting. He has AUD and COPD.  ?RD was consulted, appreciate their recommendations. ?Patient had reported poor swallowing contributing to poor oral intake and his malnutrition.  Speech evaluated the patient recommended esophagram.  which showed esophageal dysmotility, and no luminal mass.  He can likely follow-up as an outpatient with GI for manometry and EGD.  Patient is edentulous, and this is likely also contributing to his poor oral intake.  However since admission he has been eating well. ?Suspect possible refeeding syndrome given patient's poor oral intake prior to admission.  We will continue to monitor this and keep her repleted.   ? ?Hypomagnesemia. ?Continue to monitor. ?- Replete as necessary. ? ?#Hypertension ?Patient was hypotensive on admission.  This was in the setting of hypovolemia and continue antihypertensive use. ?-Hold home antihypertensives. ?- Continue to monitor blood pressure in the setting of adrenal insufficiency.  Hydrocortisone as well as fludrocortisone assisting with blood pressure. ?  ?#AKI ?Renal ultrasound was negative.  This has resolved. ?  ?#Elevation in liver enzymes ?Alcohol  use ?Patient had MRI with MRCP to further investigate possible obstructing pancreatic head mass seen on abdominal ultrasound.  This study has returned negative. ?We will continue to monitor. ?   ?#Alcohol Use disorder  ?Patient states he drinks at least a 24 oz beer daily. Markedly elevated ETOH levels on admission and noted to have >2:1 AST/ALT pattern of hepatocellular injury, improved. ?-TOC c/s for resources on cessation ?-CIWA discontinued ?- Thiamine supplementation.  B12 and folate were checked on last admission and within normal limits. ?  ? ?  ? ?  Signature: ?Adron Bene, MD  ?Internal Medicine Resident, PGY-1 ?[facility]  ?Pager: [phone] ?5:57 PM, 09/30/2021  ? ?Please contact the on call pager after 5 pm and on weekends at [phone]. ? ?

## 2021-09-30 NOTE — Progress Notes (Signed)
Mobility Specialist Progress Note: ? ? 09/30/21 1137  ?Mobility  ?Activity Ambulated with assistance in hallway  ?Level of Assistance Modified independent, requires aide device or extra time  ?Assistive Device Four wheel walker  ?Distance Ambulated (ft) 570 ft  ?Activity Response Tolerated well  ?$Mobility charge 1 Mobility  ? ?Pt received in bed willing to participate in mobility. No complaints of pain. Left in bed with call bell in reach and all needs met.  ? ?Post Mobility:   120 HR ? ?Jaime Cline ?Mobility Specialist ?Primary Phone [phone] ? ?

## 2021-09-30 NOTE — Progress Notes (Signed)
Nutrition Follow-up ? ?DOCUMENTATION CODES:  ?Severe malnutrition in context of chronic illness, Underweight ? ?INTERVENTION:  ?Continue current diet as ordered, encourage PO intake ?Continue Ensure Enlive po QID, each supplement provides 350 kcal and 20 grams of protein. ?Continue Magic cup TID with meals, each supplement provides 290 kcal and 9 grams of protein ?Continue vitamin regimen as ordered ? ?NUTRITION DIAGNOSIS:  ?Severe Malnutrition related to chronic illness (COPD) as evidenced by severe fat depletion, severe muscle depletion. ?- remains applicable ? ?GOAL:  ?Patient will meet greater than or equal to 90% of their needs ?- progressing, good intake of meals, supplements being consumed ? ?MONITOR:  ?PO intake, Supplement acceptance, Weight trends ? ?REASON FOR ASSESSMENT:  ?Consult ?Assessment of nutrition requirement/status ? ?ASSESSMENT:  ?61 y.o. male with hx of EtOH abuse, COPD, and HTN presented to ED after a fall at home where his head was struck. Pt reports poor appetite PTA. ? ?Pt resting in bed at the time of assessment. Pt reports that he has had a good appetite since last assessment and is drinking all ensures that are brought to him. ? ?Reviewed labs, suggestive of refeeding syndrome. Attending monitoring and replacing. ? ?Average Meal Intake: ?4/11-4/14: 100% intake x 4 recorded meals ? ?Nutritionally Relevant Medications: ?Scheduled Meds: ? feeding supplement  237 mL Oral TID PC & HS  ? folic acid  1 mg Oral Daily  ? multivitamin with minerals  1 tablet Oral Daily  ? pantoprazole  40 mg Oral Daily  ? thiamine  100 mg Oral Daily  ? ?Labs Reviewed: ?Sodium 130, Chloride 92 ?Phosphorus 2.1 ?Mg 1.6 ? ?Vitamin Labs ?Iron - 81 ?Folate - 7.7 ?Vitamin B12 - 441 ? ?NUTRITION - FOCUSED PHYSICAL EXAM: ?Flowsheet Row Most Recent Value  ?Orbital Region Severe depletion  ?Upper Arm Region Severe depletion  ?Thoracic and Lumbar Region Severe depletion  ?Buccal Region Severe depletion  ?Temple Region Severe  depletion  ?Clavicle Bone Region Severe depletion  ?Clavicle and Acromion Bone Region Severe depletion  ?Scapular Bone Region Severe depletion  ?Dorsal Hand Severe depletion  ?Patellar Region Severe depletion  ?Anterior Thigh Region Severe depletion  ?Posterior Calf Region Severe depletion  ?Edema (RD Assessment) None  ?Hair Reviewed  ?Eyes Reviewed  ?Mouth Reviewed  ?Skin Reviewed  ?Nails Reviewed  ? ?Diet Order:   ?Diet Order   ? ?       ?  Diet regular Room service appropriate? Yes; Fluid consistency: Thin  Diet effective now       ?  ? ?  ?  ? ?  ? ? ?EDUCATION NEEDS:  ?Education needs have been addressed ? ?Skin:  Skin Assessment: Reviewed RN Assessment ? ?Last BM:  4/10 ? ?Height:  ?Ht Readings from Last 1 Encounters:  ?09/26/21 6' (1.829 m)  ? ? ?Weight:  ?Wt Readings from Last 1 Encounters:  ?09/27/21 45.7 kg  ? ? ?Ideal Body Weight:  80.9 kg ? ?BMI:  Body mass index is 13.66 kg/m?. ? ?Estimated Nutritional Needs:  ?Kcal:  1800-2000 kcal/d ?Protein:  90-110 g/d ?Fluid:  >/= 2L/d ? ? ?Greig Castilla, RD, LDN ?Clinical Dietitian ?RD pager # available in [facility]  ?After hours/weekend pager # available in [facility] ?

## 2021-09-30 NOTE — Progress Notes (Signed)
Mobility Specialist: Progress Note ? ? 09/30/21 1721  ?Mobility  ?Activity Ambulated with assistance in hallway  ?Level of Assistance Modified independent, requires aide device or extra time  ?Assistive Device Four wheel walker  ?Distance Ambulated (ft) 550 ft  ?Activity Response Tolerated well  ?$Mobility charge 1 Mobility  ? ?Received pt in bed having no complaints and agreeable to mobility. Asymptomatic throughout ambulation, sitting EOB w/ call bell in reach and all needs met. ? ?  ?Mobility Specialist ?Mobility Specialist [place]: [phone] ?Mobility Specialist [address] [place]: [phone] ? ?

## 2021-10-01 LAB — RENAL FUNCTION PANEL
Albumin: 3.4 g/dL — ABNORMAL LOW (ref 3.5–5.0)
Anion gap: 5 (ref 5–15)
BUN: 28 mg/dL — ABNORMAL HIGH (ref 6–20)
CO2: 31 mmol/L (ref 22–32)
Calcium: 9.2 mg/dL (ref 8.9–10.3)
Chloride: 95 mmol/L — ABNORMAL LOW (ref 98–111)
Creatinine, Ser: 0.91 mg/dL (ref 0.61–1.24)
GFR, Estimated: >60 mL/min (ref >60)
Glucose, Bld: 107 mg/dL — ABNORMAL HIGH (ref 70–99)
Phosphorus: 1.2 mg/dL — ABNORMAL LOW (ref 2.5–4.6)
Potassium: 4.3 mmol/L (ref 3.5–5.1)
Sodium: 131 mmol/L — ABNORMAL LOW (ref 135–145)

## 2021-10-01 LAB — BASIC METABOLIC PANEL
Anion gap: 6 (ref 5–15)
BUN: 29 mg/dL — ABNORMAL HIGH (ref 6–20)
CO2: 31 mmol/L (ref 22–32)
Calcium: 9.1 mg/dL (ref 8.9–10.3)
Chloride: 94 mmol/L — ABNORMAL LOW (ref 98–111)
Creatinine, Ser: 0.88 mg/dL (ref 0.61–1.24)
GFR, Estimated: >60 mL/min (ref >60)
Glucose, Bld: 106 mg/dL — ABNORMAL HIGH (ref 70–99)
Potassium: 4.3 mmol/L (ref 3.5–5.1)
Sodium: 131 mmol/L — ABNORMAL LOW (ref 135–145)

## 2021-10-01 LAB — PHOSPHORUS
Phosphorus: 1.2 mg/dL — ABNORMAL LOW (ref 2.5–4.6)
Phosphorus: 1.3 mg/dL — ABNORMAL LOW (ref 2.5–4.6)

## 2021-10-01 LAB — MAGNESIUM
Magnesium: 1.6 mg/dL — ABNORMAL LOW (ref 1.7–2.4)
Magnesium: 2.2 mg/dL (ref 1.7–2.4)

## 2021-10-01 MED ORDER — K PHOS MONO-SOD PHOS DI & MONO 155-852-130 MG PO TABS
500.0000 mg | ORAL_TABLET | Freq: Once | ORAL | Status: AC
  Administered 2021-10-01: 500 mg via ORAL
  Filled 2021-10-01: qty 2

## 2021-10-01 MED ORDER — MAGNESIUM SULFATE 2 GM/50ML IV SOLN
2.0000 g | Freq: Once | INTRAVENOUS | Status: AC
  Administered 2021-10-01: 2 g via INTRAVENOUS
  Filled 2021-10-01: qty 50

## 2021-10-01 MED ORDER — IPRATROPIUM-ALBUTEROL 0.5-2.5 (3) MG/3ML IN SOLN: 3.0000 mL | Freq: Four times a day (QID) | RESPIRATORY_TRACT | Status: DC | PRN

## 2021-10-01 NOTE — Progress Notes (Addendum)
? ? ?HD#3 ?Subjective:  ?Overnight Events: Episode of shortness of breath that resolved with albuterol ? ?Patient resting in bed comfortably eating breakfast without difficulty.  He continues to endorse resolution of his dysphagia.  He notes overnight he had difficulty with shortness of breath felt as though he was not getting the help he needed.  Eventually was brought albuterol inhaler to help with his breathing. ? ?Objective:  ?Vital signs in last 24 hours: ?Vitals:  ? 09/30/21 2120 09/30/21 2234 10/01/21 0412 10/01/21 0753  ?BP:  108/67 118/82 100/79  ?Pulse: (!) 105 (!) 105 98 87  ?Resp: (!) 22 18 20 16   ?Temp:  98 ?F (36.7 ?C) 97.9 ?F (36.6 ?C) 98.1 ?F (36.7 ?C)  ?TempSrc:  Oral Oral Oral  ?SpO2: 99% 99% 99% 100%  ?Weight:      ?Height:      ? ?Supplemental O2: Room Air ?SpO2: 100 % ? ?Physical Exam:  ?Constitutional: Cachectic appearing ?HENT: normocephalic atraumatic ?Eyes: conjunctiva non-erythematous ?Neck: supple ?Cardiovascular: regular rate and rhythm, no m/r/g ?Pulmonary/Chest: normal work of breathing on room air, lungs clear to auscultation bilaterally ?MSK: Decreased muscle mass ?Neurological: alert & oriented x 3 ?Skin: warm and dry ?Psych: Normal mood and thought process ? ?Filed Weights  ? 09/26/21 0004 09/27/21 1357  ?Weight: 43.9 kg 45.7 kg  ? ? ? ?Intake/Output Summary (Last 24 hours) at 10/01/2021 1153 ?Last data filed at 10/01/2021 10/03/2021 ?Gross per 24 hour  ?Intake 580 ml  ?Output 1000 ml  ?Net -420 ml  ? ?Net IO Since Admission: -30 mL [10/01/21 1153] ? ?Pertinent Labs: ? ?  Latest Ref Rng & Units 09/28/2021  ? 12:51 AM 09/27/2021  ?  5:01 AM 09/26/2021  ?  5:20 AM  ?CBC  ?WBC 4.0 - 10.5 K/uL 5.8   5.6     ?Hemoglobin 13.0 - 17.0 g/dL 11/26/2021   9.8   31.5    ?Hematocrit 39.0 - 52.0 % 27.3   26.7   42.0    ?Platelets 150 - 400 K/uL 119   134     ? ? ? ?  Latest Ref Rng & Units 10/01/2021  ?  1:07 AM 09/30/2021  ? 11:34 AM 09/30/2021  ?  1:15 AM  ?CMP  ?Glucose 70 - 99 mg/dL 10/02/2021    ? 859   98   292     ?BUN 6 - 20 mg/dL 28    ? 29   19   17     ?Creatinine 0.61 - 1.24 mg/dL 446    ?   2.86   1.05    ?Sodium 135 - 145 mmol/L 131    ? 131   130    ? 131   127    ?Potassium 3.5 - 5.1 mmol/L 4.3    ? 4.3   4.1   4.6    ?Chloride 98 - 111 mmol/L 95    ? 94   94   92    ?CO2 22 - 32 mmol/L 31    ? 31   24   27     ?Calcium 8.9 - 10.3 mg/dL 9.2    ? 9.1   9.7   9.0    ? ? ?Imaging: ?No results found. ? ?Assessment/Plan:  ? ?Principal Problem: ?  Syncope and collapse ?Active Problems: ?  Hyponatremia ?  Hypotension due to hypovolemia ?  Protein-calorie malnutrition, severe ?  Syncope ? ? ?Patient Summary: ?[name] is a 61 year old  male with a past medical history of COPD complicated by pulmonary hypertension and right heart dysfunction, hypertension, and gastritis who presents to the ED after a syncopal episode. ? ? ?Adrenal insufficiency ?Patient presented with hyponatremia and hypotension.  Work-up for adrenal insufficiency has been positive.  He has had improvement of both with hydrocortisone 10 in the daily and 5 in the evening as well as fludrocortisone 0.1 mg daily.  Virtual consult with Jaime Cline who recommend repeating ACTH studies outpatient when patient is off of prednisone.  We plan to have him follow-up with endocrinologist once ready for discharge. ?-Continue hydrocortisone 10 mg in the morning and 5 mg in the evening, continue fludrocortisone 0.1 mg daily ?-Follow-up with endocrinology outpatient ? ?Refeeding syndrome ?Patient with dysphagia prior to presentation.  This has since resolved and he has been eating well.  He denies having any food insecurities prior to admission.  Unclear etiology for dysphagia but happy to see that is better and he is eating well.  We are managing his electrolytes closely and have recommended smaller meals as repeat phosphorus levels drop significantly with larger meals. ?-Repeat electrolytes as needed ?-Encourage small frequent meals ? ?COPD ?Breathing has improved  significantly, presented with exacerbation.  He completed azithromycin and prednisone.  We will continue albuterol as needed and DuoNebs as needed. ? ?Alcohol use disorder ?Continue thiamine B12 and folate, patient doing well no signs of withdrawal ? ?Diet: Small frequent meals ?IVF: None,None ?VTE: Enoxaparin ?Code: Full ?PT/OT recs: None, walker. ?TOC recs: Substance use ? ? ?Dispo: Anticipated discharge to Home in 1 to 2 days pending improvement of refeeding syndrome and stabilization of his electrolytes ? ?Jaime Damere Brandenburg DO ?Internal Medicine Resident PGY-2 ?Pager [phone] ?Please contact the on call pager after 5 pm and on weekends at [phone].  ?

## 2021-10-01 NOTE — Progress Notes (Signed)
Mobility Specialist: Progress Note ? ? 10/01/21 1531  ?Mobility  ?Activity Ambulated with assistance in hallway  ?Level of Assistance Modified independent, requires aide device or extra time  ?Assistive Device Four wheel walker  ?Distance Ambulated (ft) 550 ft  ?Activity Response Tolerated well  ?$Mobility charge 1 Mobility  ? ?Received pt in bed having no complaints and agreeable to mobility. Asymptomatic throughout ambulation, sitting EOB w/ call bell in reach and all needs met. ? ?Harrell Gave Arnoldo Hildreth ?Mobility Specialist ?Mobility Specialist [place]: [phone] ?Mobility Specialist [place]: [phone] ? ?

## 2021-10-02 LAB — RENAL FUNCTION PANEL
Albumin: 3.4 g/dL — ABNORMAL LOW (ref 3.5–5.0)
Albumin: 3.5 g/dL (ref 3.5–5.0)
Anion gap: 9 (ref 5–15)
Anion gap: 7 (ref 5–15)
BUN: 28 mg/dL — ABNORMAL HIGH (ref 6–20)
BUN: 32 mg/dL — ABNORMAL HIGH (ref 6–20)
CO2: 27 mmol/L (ref 22–32)
CO2: 30 mmol/L (ref 22–32)
Calcium: 8.8 mg/dL — ABNORMAL LOW (ref 8.9–10.3)
Calcium: 9.2 mg/dL (ref 8.9–10.3)
Chloride: 92 mmol/L — ABNORMAL LOW (ref 98–111)
Chloride: 96 mmol/L — ABNORMAL LOW (ref 98–111)
Creatinine, Ser: 0.98 mg/dL (ref 0.61–1.24)
Creatinine, Ser: 0.9 mg/dL (ref 0.61–1.24)
GFR, Estimated: >60 mL/min (ref >60)
GFR, Estimated: >60 mL/min (ref >60)
Glucose, Bld: 73 mg/dL (ref 70–99)
Glucose, Bld: 99 mg/dL (ref 70–99)
Phosphorus: 1.3 mg/dL — ABNORMAL LOW (ref 2.5–4.6)
Phosphorus: 4.9 mg/dL — ABNORMAL HIGH (ref 2.5–4.6)
Potassium: 4.3 mmol/L (ref 3.5–5.1)
Potassium: 4.4 mmol/L (ref 3.5–5.1)
Sodium: 128 mmol/L — ABNORMAL LOW (ref 135–145)
Sodium: 133 mmol/L — ABNORMAL LOW (ref 135–145)

## 2021-10-02 LAB — PHOSPHORUS
Phosphorus: 5.1 mg/dL — ABNORMAL HIGH (ref 2.5–4.6)
Phosphorus: 4 mg/dL (ref 2.5–4.6)

## 2021-10-02 LAB — OSMOLALITY, URINE: Osmolality, Ur: 595 mOsm/kg (ref 300–900)

## 2021-10-02 LAB — MAGNESIUM
Magnesium: 1.9 mg/dL (ref 1.7–2.4)
Magnesium: 1.7 mg/dL (ref 1.7–2.4)

## 2021-10-02 LAB — VITAMIN D 25 HYDROXY (VIT D DEFICIENCY, FRACTURES): Vit D, 25-Hydroxy: 32.48 ng/mL (ref 30–100)

## 2021-10-02 LAB — BASIC METABOLIC PANEL
Anion gap: 9 (ref 5–15)
BUN: 32 mg/dL — ABNORMAL HIGH (ref 6–20)
CO2: 28 mmol/L (ref 22–32)
Calcium: 9 mg/dL (ref 8.9–10.3)
Chloride: 94 mmol/L — ABNORMAL LOW (ref 98–111)
Creatinine, Ser: 0.8 mg/dL (ref 0.61–1.24)
GFR, Estimated: >60 mL/min (ref >60)
Glucose, Bld: 100 mg/dL — ABNORMAL HIGH (ref 70–99)
Potassium: 4.3 mmol/L (ref 3.5–5.1)
Sodium: 131 mmol/L — ABNORMAL LOW (ref 135–145)

## 2021-10-02 LAB — SODIUM, URINE, RANDOM: Sodium, Ur: 98 mmol/L

## 2021-10-02 MED ORDER — SODIUM PHOSPHATES 45 MMOLE/15ML IV SOLN
30.0000 mmol | Freq: Once | INTRAVENOUS | Status: AC
  Administered 2021-10-02: 30 mmol via INTRAVENOUS
  Filled 2021-10-02: qty 10

## 2021-10-02 NOTE — Progress Notes (Addendum)
? ? ?HD#4 ?Subjective:  ?Overnight Events: Episode of shortness of breath that resolved with albuterol ? ?Patient resting in bed comfortably eating breakfast without difficulty.  States he has been ambulating much without difficulty.  Eating well, states that he has tried to eat smaller portions, more frequently.  No other complaints at this time. ? ?Objective:  ?Vital signs in last 24 hours: ?Vitals:  ? 10/01/21 2013 10/02/21 0444 10/02/21 0854 10/02/21 0900  ?BP: (!) 157/132 107/71 (!) 147/99   ?Pulse: (!) 108 92 (!) 104   ?Resp: 20 18 20    ?Temp: 98.1 ?F (36.7 ?C) 98 ?F (36.7 ?C) 97.7 ?F (36.5 ?C)   ?TempSrc: Oral Oral Oral   ?SpO2: 97% 98% 97% 97%  ?Weight:      ?Height:      ? ?Supplemental O2: Room Air ?SpO2: 97 % ?O2 Flow Rate (L/min): 3 L/min ? ?Physical Exam:  ?Constitutional: Cachectic appearing ?HENT: normocephalic atraumatic, edentulous ?Eyes: conjunctiva non-erythematous ?Neck: supple ?Cardiovascular: regular rate and rhythm, no m/r/g ?Pulmonary/Chest: normal work of breathing on room air, lungs clear to auscultation bilaterally ?MSK: Decreased muscle mass ?Neurological: alert & oriented x 3 ?Skin: warm and dry ?Psych: Normal mood and thought process ? ?Filed Weights  ? 09/26/21 0004 09/27/21 1357  ?Weight: 43.9 kg 45.7 kg  ? ? ? ?Intake/Output Summary (Last 24 hours) at 10/02/2021 1039 ?Last data filed at 10/02/2021 0900 ?Gross per 24 hour  ?Intake 280.36 ml  ?Output 810 ml  ?Net -529.64 ml  ? ?Net IO Since Admission: -559.64 mL [10/02/21 1039] ? ?Pertinent Labs: ? ?  Latest Ref Rng & Units 09/28/2021  ? 12:51 AM 09/27/2021  ?  5:01 AM 09/26/2021  ?  5:20 AM  ?CBC  ?WBC 4.0 - 10.5 K/uL 5.8   5.6     ?Hemoglobin 13.0 - 17.0 g/dL 11/26/2021   9.8   02.7    ?Hematocrit 39.0 - 52.0 % 27.3   26.7   42.0    ?Platelets 150 - 400 K/uL 119   134     ? ? ? ?  Latest Ref Rng & Units 10/02/2021  ? 12:36 AM 10/01/2021  ?  1:07 AM 09/30/2021  ? 11:34 AM  ?CMP  ?Glucose 70 - 99 mg/dL 73   10/02/2021    ? 664   98    ?BUN 6 - 20 mg/dL 28    28    ? 29   19    ?Creatinine 0.61 - 1.24 mg/dL 403   4.74    ? 2.59   0.96    ?Sodium 135 - 145 mmol/L 128   131    ? 131   130    ? 131    ?Potassium 3.5 - 5.1 mmol/L 4.3   4.3    ? 4.3   4.1    ?Chloride 98 - 111 mmol/L 92   95    ? 94   94    ?CO2 22 - 32 mmol/L 27   31    ? 31   24    ?Calcium 8.9 - 10.3 mg/dL 8.8   9.2    ? 9.1   9.7    ? ? ?Imaging: ?No results found. ? ?Assessment/Plan:  ? ?Principal Problem: ?  Syncope and collapse ?Active Problems: ?  Hyponatremia ?  Hypotension due to hypovolemia ?  Protein-calorie malnutrition, severe ?  Syncope ? ? ?Patient Summary: ?Jaime Cline is a 61 year old male with a past medical  history of COPD complicated by pulmonary hypertension and right heart dysfunction, hypertension, and gastritis who presents to the ED after a syncopal episode. ? ? ?Adrenal insufficiency ?Patient presented with hyponatremia and hypotension.  Work-up for adrenal insufficiency has been positive.  He has had improvement of both with hydrocortisone 10 in the daily and 5 in the evening as well as fludrocortisone 0.1 mg daily.  Virtual consult with [facility] who recommend repeating ACTH studies outpatient when patient is off of prednisone.  We plan to have him follow-up with endocrinologist once ready for discharge.  Sodium will need to be stable prior to discharge. ?-Sodium 128 this morning.  We will recollect urine studies. ?-Continue hydrocortisone 10 mg in the morning and 5 mg in the evening, continue fludrocortisone 0.1 mg daily ?-Follow-up with endocrinology outpatient ? ?Refeeding syndrome  ?Patient with dysphagia prior to presentation.  This has since resolved and he has been eating well.  He denies having any food insecurities prior to admission.  Unclear etiology for dysphagia but happy to see that is better and he is eating well.  We are managing his electrolytes closely and have recommended smaller meals as repeat phosphorus levels drop significantly with larger meals.  His  phosphate will need to be stable prior to discharge. ?-Repeat electrolytes as needed.  Phosphate noted to be 1.3 this morning it was repleted, will follow up recheck. ?-Encourage small frequent meals ?-We will check urine for phosphate losses.  Low vitamin D levels can also contribute, will check this as well. ? ?COPD ?Breathing has improved significantly, presented with exacerbation.  He completed azithromycin and prednisone.  We will continue albuterol as needed and DuoNebs as needed. ? ?Alcohol use disorder ?Continue thiamine B12 and folate, patient doing well no signs of withdrawal ? ?Diet: Small frequent meals ?IVF: None,None ?VTE: Enoxaparin ?Code: Full ?PT/OT recs: None, walker. ?TOC recs: Substance use ? ? ?Dispo: Anticipated discharge to Home in 1 to 2 days pending improvement of refeeding syndrome and stabilization of his electrolytes ? ?Adron Bene, MD ?Internal Medicine Resident PGY-1 ?Pager [phone] ?Please contact the on call pager after 5 pm and on weekends at [phone].  ?

## 2021-10-03 ENCOUNTER — Other Ambulatory Visit (HOSPITAL_COMMUNITY): Payer: Self-pay

## 2021-10-03 LAB — HEPATIC FUNCTION PANEL
ALT: 80 U/L — ABNORMAL HIGH (ref 0–44)
AST: 53 U/L — ABNORMAL HIGH (ref 15–41)
Albumin: 3.7 g/dL (ref 3.5–5.0)
Alkaline Phosphatase: 39 U/L (ref 38–126)
Bilirubin, Direct: <0.1 mg/dL (ref 0.0–0.2)
Total Bilirubin: 0.3 mg/dL (ref 0.3–1.2)
Total Protein: 6.4 g/dL — ABNORMAL LOW (ref 6.5–8.1)

## 2021-10-03 LAB — RENAL FUNCTION PANEL
Albumin: 3.6 g/dL (ref 3.5–5.0)
Anion gap: 7 (ref 5–15)
BUN: 26 mg/dL — ABNORMAL HIGH (ref 6–20)
CO2: 28 mmol/L (ref 22–32)
Calcium: 9.4 mg/dL (ref 8.9–10.3)
Chloride: 97 mmol/L — ABNORMAL LOW (ref 98–111)
Creatinine, Ser: 1.02 mg/dL (ref 0.61–1.24)
GFR, Estimated: >60 mL/min (ref >60)
Glucose, Bld: 98 mg/dL (ref 70–99)
Phosphorus: 4 mg/dL (ref 2.5–4.6)
Potassium: 4.1 mmol/L (ref 3.5–5.1)
Sodium: 132 mmol/L — ABNORMAL LOW (ref 135–145)

## 2021-10-03 LAB — MAGNESIUM: Magnesium: 1.8 mg/dL (ref 1.7–2.4)

## 2021-10-03 MED ORDER — FLUDROCORTISONE ACETATE 0.1 MG PO TABS
0.1000 mg | ORAL_TABLET | Freq: Every day | ORAL | 0 refills | Status: AC
  Filled 2021-10-03: qty 30, 30d supply, fill #0

## 2021-10-03 MED ORDER — HYDROXYZINE HCL 10 MG PO TABS
10.0000 mg | ORAL_TABLET | Freq: Four times a day (QID) | ORAL | 0 refills | Status: AC | PRN
  Filled 2021-10-03: qty 20, 5d supply, fill #0

## 2021-10-03 MED ORDER — HYDROCORTISONE 10 MG PO TABS
10.0000 mg | ORAL_TABLET | Freq: Every day | ORAL | 0 refills | Status: AC
Start: 2021-10-04 — End: 2021-11-03
  Filled 2021-10-03: qty 30, 30d supply, fill #0

## 2021-10-03 MED ORDER — ORAL CARE MOUTH RINSE: 15.0000 mL | Freq: Two times a day (BID) | OROMUCOSAL | Status: DC

## 2021-10-03 MED ORDER — HYDROCORTISONE 5 MG PO TABS
5.0000 mg | ORAL_TABLET | ORAL | 0 refills | Status: AC
  Filled 2021-10-03: qty 30, 30d supply, fill #0

## 2021-10-03 NOTE — Progress Notes (Signed)
Mobility Specialist: Progress Note ? ? 10/03/21 1250  ?Mobility  ?Activity Refused mobility  ? ?Pt refused mobility stating he is supposed to discharge soon. Will f/u as able.  ? ?Jaime Cline ?Mobility Specialist ?Mobility Specialist [place]: [phone] ?Mobility Specialist [place]: [phone] ? ?

## 2021-10-03 NOTE — Progress Notes (Signed)
Jenelle Mages to be D/C'd  per MD order.  Discussed with the patient and all questions fully answered. ? ?VSS, Skin clean, dry and intact without evidence of skin break down, no evidence of skin tears noted. ? ?IV catheter discontinued intact. Site without signs and symptoms of complications. Dressing and pressure applied. ? ?An After Visit Summary was printed and given to the patient. ? ?D/c education completed with patient/family including follow up instructions, medication list, d/c activities limitations if indicated, with other d/c instructions as indicated by MD - patient able to verbalize understanding, all questions fully answered.  ? ?Patient instructed to return to ED, call 911, or call MD for any changes in condition.  ? ?Patient to be escorted via WC, and D/C home via private auto.  ?

## 2021-10-03 NOTE — Discharge Instructions (Addendum)
Dear Mr. Negro, ? ?Thank yo for trusting [place] with your care. We treated you for electrolyte abnormalities and low blood pressure.  ? ?Please take these medicines: ?- hydrocortisone 10mg  in the morning with food and 5mg  in the evening with food.  ?- please take the fludrocortisone 0.1mg  daily. ?- please also remember to continue to eat small portions throughout the day for now until your can be seen in the outpatient clinic. ? ?Please stop taking the Lisinopril. ? ?Please take your albuterol and Advair as you have been. ? ?We have sent you home with a short course of hydroxyzine to use for anxiety. Please also talk to the doctor at follow up about anxiety. It will also be important to follow up with an Endocrinologist and stomach doctor to evaluate your trouble swallowing.  ?Please [facility] with the [facility]. Their number is [phone] .  ?

## 2021-10-13 ENCOUNTER — Encounter: Payer: PRIVATE HEALTH INSURANCE | Admitting: Internal Medicine

## 2021-10-18 ENCOUNTER — Ambulatory Visit: Payer: Medicaid Other | Admitting: Speech Pathology

## 2021-10-18 ENCOUNTER — Ambulatory Visit: Payer: Medicaid Other

## 2021-11-17 ENCOUNTER — Ambulatory Visit: Payer: Self-pay | Admitting: Nurse Practitioner

## 2021-12-19 ENCOUNTER — Other Ambulatory Visit: Payer: Self-pay

## 2021-12-19 ENCOUNTER — Encounter (HOSPITAL_COMMUNITY): Payer: Self-pay | Admitting: Emergency Medicine

## 2021-12-19 ENCOUNTER — Emergency Department (HOSPITAL_COMMUNITY)
Admission: EM | Admit: 2021-12-19 | Discharge: 2021-12-20 | Discharge status: Against Medical Advice | Payer: Medicaid Other | Attending: Emergency Medicine | Admitting: Emergency Medicine

## 2021-12-19 ENCOUNTER — Emergency Department (HOSPITAL_COMMUNITY): Payer: Medicaid Other

## 2021-12-19 DIAGNOSIS — R059 Cough, unspecified: Secondary | ICD-10-CM | POA: Insufficient documentation

## 2021-12-19 DIAGNOSIS — R0602 Shortness of breath: Secondary | ICD-10-CM | POA: Diagnosis present

## 2021-12-19 DIAGNOSIS — Z5321 Procedure and treatment not carried out due to patient leaving prior to being seen by health care provider: Secondary | ICD-10-CM | POA: Insufficient documentation

## 2021-12-19 DIAGNOSIS — M79669 Pain in unspecified lower leg: Secondary | ICD-10-CM | POA: Insufficient documentation

## 2021-12-19 DIAGNOSIS — J449 Chronic obstructive pulmonary disease, unspecified: Secondary | ICD-10-CM | POA: Diagnosis not present

## 2021-12-19 LAB — CBC
HCT: 40.4 % (ref 39.0–52.0)
Hemoglobin: 13.4 g/dL (ref 13.0–17.0)
MCH: 32.5 pg (ref 26.0–34.0)
MCHC: 33.2 g/dL (ref 30.0–36.0)
MCV: 98.1 fL (ref 80.0–100.0)
Platelets: 236 10*3/uL (ref 150–400)
RBC: 4.12 MIL/uL — ABNORMAL LOW (ref 4.22–5.81)
RDW: 15.9 % — ABNORMAL HIGH (ref 11.5–15.5)
WBC: 7.5 10*3/uL (ref 4.0–10.5)
nRBC: 0 % (ref 0.0–0.2)

## 2021-12-19 LAB — BASIC METABOLIC PANEL
Anion gap: 14 (ref 5–15)
BUN: 21 mg/dL (ref 8–23)
CO2: 19 mmol/L — ABNORMAL LOW (ref 22–32)
Calcium: 9 mg/dL (ref 8.9–10.3)
Chloride: 106 mmol/L (ref 98–111)
Creatinine, Ser: 0.84 mg/dL (ref 0.61–1.24)
GFR, Estimated: >60 mL/min (ref >60)
Glucose, Bld: 66 mg/dL — ABNORMAL LOW (ref 70–99)
Potassium: 3.7 mmol/L (ref 3.5–5.1)
Sodium: 139 mmol/L (ref 135–145)

## 2021-12-19 NOTE — ED Notes (Signed)
Patient states he might leave and go to [facility]

## 2021-12-19 NOTE — ED Triage Notes (Signed)
Pt brought to ED by [facility] with c/o shortness of breath of one month accompanied by cough. Pt reports hx of emphysema.   EMS Vitals BP 118/78 HR 105 RR 20 SPO2 94% RA - 98% 2L [place]

## 2021-12-19 NOTE — ED Provider Triage Note (Signed)
Emergency Medicine Provider Triage Evaluation Note  Jaime Cline , a 61 y.o. male  was evaluated in triage.  Pt complains of worsening shortness of breath for 1 month, with bad cough.  He reports history of COPD.  He reports symptoms are worse with exertion.  He denies chest pain, fever, or chills.  Patient also endorses some ongoing leg pain.  He reports that he has been having leg pain chronically for a while now.  No recent new falls since last evaluation.  Review of Systems  Positive: Shortness of breath, cough Negative: Chest pain  Physical Exam  BP 96/76   Pulse 95   Temp 98.1 F (36.7 C) (Oral)   Resp 18   SpO2 96%  Gen:   Awake, no distress   Resp:  Normal effort, poor excursion throughout, no significant wheezing noted today MSK:   Moves extremities without difficulty  Other:    Medical Decision Making  Medically screening exam initiated at 9:12 PM.  Appropriate orders placed.  Jenelle Mages was informed that the remainder of the evaluation will be completed by another provider, this initial triage assessment does not replace that evaluation, and the importance of remaining in the ED until their evaluation is complete.  Workup initiated   Olene Floss, [place] 12/19/21 2112

## 2021-12-20 NOTE — ED Notes (Signed)
Patient called for vitals x2 with no response and not visible sitting on the bench outside ER.

## 2022-01-09 ENCOUNTER — Emergency Department (HOSPITAL_COMMUNITY): Payer: Medicaid Other

## 2022-01-09 ENCOUNTER — Other Ambulatory Visit: Payer: Self-pay

## 2022-01-09 ENCOUNTER — Encounter (HOSPITAL_COMMUNITY): Payer: Self-pay | Admitting: *Deleted

## 2022-01-09 ENCOUNTER — Emergency Department (HOSPITAL_COMMUNITY)
Admission: EM | Admit: 2022-01-09 | Discharge: 2022-01-10 | Disposition: A | Discharge status: Home / Self Care | Payer: Medicaid Other | Attending: Emergency Medicine | Admitting: Emergency Medicine

## 2022-01-09 DIAGNOSIS — Z20822 Contact with and (suspected) exposure to covid-19: Secondary | ICD-10-CM | POA: Insufficient documentation

## 2022-01-09 DIAGNOSIS — J45909 Unspecified asthma, uncomplicated: Secondary | ICD-10-CM | POA: Insufficient documentation

## 2022-01-09 DIAGNOSIS — Z7951 Long term (current) use of inhaled steroids: Secondary | ICD-10-CM | POA: Diagnosis not present

## 2022-01-09 DIAGNOSIS — R0602 Shortness of breath: Secondary | ICD-10-CM | POA: Diagnosis present

## 2022-01-09 DIAGNOSIS — J449 Chronic obstructive pulmonary disease, unspecified: Secondary | ICD-10-CM | POA: Insufficient documentation

## 2022-01-09 MED ORDER — MAGNESIUM SULFATE 2 GM/50ML IV SOLN
2.0000 g | Freq: Once | INTRAVENOUS | Status: AC
  Administered 2022-01-09: 2 g via INTRAVENOUS

## 2022-01-09 NOTE — ED Provider Notes (Signed)
[facility] EMERGENCY DEPARTMENT Provider Note   CSN: [id number] Arrival date & time: 01/09/22  2325     History {Add pertinent medical, surgical, social history, OB history to HPI:1} Chief Complaint  Patient presents with   Shortness of Breath    Jaime Cline is a 61 y.o. male.  The history is provided by the patient and medical records.  Shortness of Breath Associated symptoms: cough    61 year old male with history of asthma, COPD, anemia, presenting to the ED with shortness of breath.  Has been feeling unwell for the past several days, generalized fatigue with some nausea and vomiting.  EMS called today due to worsening shortness of breath.  Found to be clammy and in respiratory distress with accessory muscle use.  Sats were 99% on room air.  He was given 10 mg albuterol and 125 mg IV Solu-Medrol, still resting on arrival to ED.  Home Medications Prior to Admission medications   Medication Sig Start Date End Date Taking? Authorizing Provider  albuterol (VENTOLIN HFA) 108 (90 Base) MCG/ACT inhaler Inhale 2 puffs into the lungs every 4 (four) hours as needed for wheezing or shortness of breath. 08/29/21   Champ Mungo, DO  atorvastatin (LIPITOR) 20 MG tablet Take 1 tablet (20 mg total) by mouth daily. 08/29/21   Champ Mungo, DO  famotidine (PEPCID) 20 MG tablet Take 20 mg by mouth 2 (two) times daily.    [provider]  Fluticasone-Salmeterol (ADVAIR) 100-50 MCG/DOSE AEPB Inhale 1 puff into the lungs 2 (two) times daily. 08/29/21   Champ Mungo, DO  sertraline (ZOLOFT) 50 MG tablet Take 50 mg by mouth daily.    [provider]  albuterol (PROVENTIL,VENTOLIN) 90 MCG/ACT inhaler Inhale 2 puffs into the lungs every 6 (six) hours as needed. Shortness of breath and wheezing   03/22/12  [provider]      Allergies    Other    Review of Systems   Review of Systems  Respiratory:  Positive for cough and shortness of breath.   All other  systems reviewed and are negative.   Physical Exam Updated Vital Signs There were no vitals taken for this visit.  Physical Exam Vitals and nursing note reviewed.  Constitutional:      Appearance: He is well-developed.  HENT:     Head: Normocephalic and atraumatic.  Eyes:     Conjunctiva/sclera: Conjunctivae normal.     Pupils: Pupils are equal, round, and reactive to light.  Cardiovascular:     Rate and Rhythm: Normal rate and regular rhythm.     Heart sounds: Normal heart sounds.  Pulmonary:     Effort: Pulmonary effort is normal.     Breath sounds: Wheezing present.     Comments: Diminished breath sounds, end expiratory wheezes noted, grunting present Abdominal:     General: Bowel sounds are normal.     Palpations: Abdomen is soft.  Musculoskeletal:        General: Normal range of motion.     Cervical back: Normal range of motion.  Skin:    General: Skin is warm and dry.  Neurological:     Mental Status: He is alert and oriented to person, place, and time.     ED Results / Procedures / Treatments   Labs (all labs ordered are listed, but only abnormal results are displayed) Labs Reviewed  SARS CORONAVIRUS 2 BY RT PCR  RESPIRATORY PANEL BY PCR  CBC WITH DIFFERENTIAL/PLATELET  COMPREHENSIVE METABOLIC  PANEL  TROPONIN I (HIGH SENSITIVITY)    EKG None  Radiology No results found.  Procedures Procedures  {Document cardiac monitor, telemetry assessment procedure when appropriate:1}  Medications Ordered in ED Medications  magnesium sulfate IVPB 2 g 50 mL (has no administration in time range)    ED Course/ Medical Decision Making/ A&P                           Medical Decision Making Amount and/or Complexity of Data Reviewed Labs: ordered. Radiology: ordered. ECG/medicine tests: ordered.  Risk Prescription drug management.   ***  {Document critical care time when appropriate:1} {Document review of labs and clinical decision tools ie heart score,  Chads2Vasc2 etc:1}  {Document your independent review of radiology images, and any outside records:1} {Document your discussion with family members, caretakers, and with consultants:1} {Document social determinants of health affecting pt's care:1} {Document your decision making why or why not admission, treatments were needed:1} Final Clinical Impression(s) / ED Diagnoses Final diagnoses:  None    Rx / DC Orders ED Discharge Orders     None

## 2022-01-09 NOTE — ED Triage Notes (Signed)
Pt arrives via [facility] from home with c/o shortness of breath. Per report by EMS, not been feeling well for several days, generalized malaise and n/v. On arrival by ems, pt had Diminished lung sounds, clammy, accessory muscle use. Initial saturations 99%RA. Given Neb albuterol 10 and 125 solumedrol. Breathing improved after neb but still grunting. IV established in the left forearm.

## 2022-01-10 LAB — RESPIRATORY PANEL BY PCR

## 2022-01-10 LAB — COMPREHENSIVE METABOLIC PANEL
ALT: 19 U/L (ref 0–44)
AST: 32 U/L (ref 15–41)
Albumin: 4.3 g/dL (ref 3.5–5.0)
Alkaline Phosphatase: 60 U/L (ref 38–126)
Anion gap: 10 (ref 5–15)
BUN: 15 mg/dL (ref 8–23)
CO2: 24 mmol/L (ref 22–32)
Calcium: 9.1 mg/dL (ref 8.9–10.3)
Chloride: 102 mmol/L (ref 98–111)
Creatinine, Ser: 0.86 mg/dL (ref 0.61–1.24)
GFR, Estimated: >60 mL/min (ref >60)
Glucose, Bld: 105 mg/dL — ABNORMAL HIGH (ref 70–99)
Potassium: 3.9 mmol/L (ref 3.5–5.1)
Sodium: 136 mmol/L (ref 135–145)
Total Bilirubin: 0.6 mg/dL (ref 0.3–1.2)
Total Protein: 7.1 g/dL (ref 6.5–8.1)

## 2022-01-10 LAB — CBC WITH DIFFERENTIAL/PLATELET
Abs Immature Granulocytes: 0.02 10*3/uL (ref 0.00–0.07)
Basophils Absolute: 0.1 10*3/uL (ref 0.0–0.1)
Basophils Relative: 1 %
Eosinophils Absolute: 0.1 10*3/uL (ref 0.0–0.5)
Eosinophils Relative: 3 %
HCT: 40 % (ref 39.0–52.0)
Hemoglobin: 13.2 g/dL (ref 13.0–17.0)
Immature Granulocytes: 0 %
Lymphocytes Relative: 37 %
Lymphs Abs: 2.1 10*3/uL (ref 0.7–4.0)
MCH: 32.8 pg (ref 26.0–34.0)
MCHC: 33 g/dL (ref 30.0–36.0)
MCV: 99.5 fL (ref 80.0–100.0)
Monocytes Absolute: 0.8 10*3/uL (ref 0.1–1.0)
Monocytes Relative: 13 %
Neutro Abs: 2.6 10*3/uL (ref 1.7–7.7)
Neutrophils Relative %: 46 %
Platelets: 246 10*3/uL (ref 150–400)
RBC: 4.02 MIL/uL — ABNORMAL LOW (ref 4.22–5.81)
RDW: 15.4 % (ref 11.5–15.5)
WBC: 5.7 10*3/uL (ref 4.0–10.5)
nRBC: 0 % (ref 0.0–0.2)

## 2022-01-10 LAB — TROPONIN I (HIGH SENSITIVITY): Troponin I (High Sensitivity): 5 ng/L (ref <18)

## 2022-01-10 LAB — SARS CORONAVIRUS 2 BY RT PCR: SARS Coronavirus 2 by RT PCR: NEGATIVE

## 2022-01-10 MED ORDER — SERTRALINE HCL 50 MG PO TABS
50.0000 mg | ORAL_TABLET | Freq: Every day | ORAL | Status: DC
  Administered 2022-01-10: 50 mg via ORAL
  Filled 2022-01-10: qty 1

## 2022-01-10 MED ORDER — SERTRALINE HCL 50 MG PO TABS: 50.0000 mg | ORAL_TABLET | Freq: Every day | ORAL | 0 refills | Status: DC

## 2022-01-10 MED ORDER — ALBUTEROL SULFATE HFA 108 (90 BASE) MCG/ACT IN AERS
2.0000 | INHALATION_SPRAY | Freq: Once | RESPIRATORY_TRACT | Status: AC
  Administered 2022-01-10: 2 via RESPIRATORY_TRACT
  Filled 2022-01-10: qty 6.7

## 2022-01-10 MED ORDER — ALBUTEROL SULFATE HFA 108 (90 BASE) MCG/ACT IN AERS: 1.0000 | INHALATION_SPRAY | Freq: Four times a day (QID) | RESPIRATORY_TRACT | 0 refills | Status: DC | PRN

## 2022-01-10 NOTE — ED Notes (Signed)
Discharge instructions discussed with pt. Pt verbalized understanding with no questions at this time. Pt to go home via bus. Bus pass was provided for pt.

## 2022-01-24 ENCOUNTER — Encounter (HOSPITAL_COMMUNITY): Payer: Self-pay | Admitting: Internal Medicine

## 2022-01-24 ENCOUNTER — Inpatient Hospital Stay (HOSPITAL_COMMUNITY)
Admission: EM | Admit: 2022-01-24 | Discharge: 2022-01-27 | DRG: 192 | Disposition: A | Discharge status: Home / Self Care | Payer: Medicaid Other | Attending: Internal Medicine | Admitting: Internal Medicine

## 2022-01-24 ENCOUNTER — Emergency Department (HOSPITAL_COMMUNITY): Payer: Medicaid Other

## 2022-01-24 DIAGNOSIS — R778 Other specified abnormalities of plasma proteins: Secondary | ICD-10-CM | POA: Diagnosis present

## 2022-01-24 DIAGNOSIS — N289 Disorder of kidney and ureter, unspecified: Secondary | ICD-10-CM | POA: Diagnosis present

## 2022-01-24 DIAGNOSIS — Z79899 Other long term (current) drug therapy: Secondary | ICD-10-CM

## 2022-01-24 DIAGNOSIS — R7989 Other specified abnormal findings of blood chemistry: Secondary | ICD-10-CM

## 2022-01-24 DIAGNOSIS — G4700 Insomnia, unspecified: Secondary | ICD-10-CM | POA: Diagnosis present

## 2022-01-24 DIAGNOSIS — Z20822 Contact with and (suspected) exposure to covid-19: Secondary | ICD-10-CM | POA: Diagnosis present

## 2022-01-24 DIAGNOSIS — F17210 Nicotine dependence, cigarettes, uncomplicated: Secondary | ICD-10-CM | POA: Diagnosis present

## 2022-01-24 DIAGNOSIS — Z888 Allergy status to other drugs, medicaments and biological substances status: Secondary | ICD-10-CM

## 2022-01-24 DIAGNOSIS — Z79891 Long term (current) use of opiate analgesic: Secondary | ICD-10-CM

## 2022-01-24 DIAGNOSIS — G8929 Other chronic pain: Secondary | ICD-10-CM | POA: Diagnosis present

## 2022-01-24 DIAGNOSIS — F1090 Alcohol use, unspecified, uncomplicated: Secondary | ICD-10-CM | POA: Diagnosis present

## 2022-01-24 DIAGNOSIS — J441 Chronic obstructive pulmonary disease with (acute) exacerbation: Principal | ICD-10-CM

## 2022-01-24 DIAGNOSIS — Z716 Tobacco abuse counseling: Secondary | ICD-10-CM

## 2022-01-24 LAB — BASIC METABOLIC PANEL
Anion gap: 18 — ABNORMAL HIGH (ref 5–15)
Anion gap: 20 — ABNORMAL HIGH (ref 5–15)
BUN: 13 mg/dL (ref 8–23)
BUN: 17 mg/dL (ref 8–23)
CO2: 18 mmol/L — ABNORMAL LOW (ref 22–32)
CO2: 18 mmol/L — ABNORMAL LOW (ref 22–32)
Calcium: 9.8 mg/dL (ref 8.9–10.3)
Calcium: 9.8 mg/dL (ref 8.9–10.3)
Chloride: 98 mmol/L (ref 98–111)
Chloride: 96 mmol/L — ABNORMAL LOW (ref 98–111)
Creatinine, Ser: 0.92 mg/dL (ref 0.61–1.24)
Creatinine, Ser: 1.09 mg/dL (ref 0.61–1.24)
GFR, Estimated: >60 mL/min (ref >60)
GFR, Estimated: >60 mL/min (ref >60)
Glucose, Bld: 85 mg/dL (ref 70–99)
Glucose, Bld: 83 mg/dL (ref 70–99)
Potassium: 4.4 mmol/L (ref 3.5–5.1)
Potassium: 4.6 mmol/L (ref 3.5–5.1)
Sodium: 134 mmol/L — ABNORMAL LOW (ref 135–145)
Sodium: 134 mmol/L — ABNORMAL LOW (ref 135–145)

## 2022-01-24 LAB — I-STAT VENOUS BLOOD GAS, ED
Acid-base deficit: 3 mmol/L — ABNORMAL HIGH (ref 0.0–2.0)
Bicarbonate: 21.4 mmol/L (ref 20.0–28.0)
Calcium, Ion: 1.09 mmol/L — ABNORMAL LOW (ref 1.15–1.40)
HCT: 48 % (ref 39.0–52.0)
Hemoglobin: 16.3 g/dL (ref 13.0–17.0)
O2 Saturation: 92 %
Potassium: 4.5 mmol/L (ref 3.5–5.1)
Sodium: 133 mmol/L — ABNORMAL LOW (ref 135–145)
TCO2: 23 mmol/L (ref 22–32)
pCO2, Ven: 37.3 mmHg — ABNORMAL LOW (ref 44–60)
pH, Ven: 7.367 (ref 7.25–7.43)
pO2, Ven: 65 mmHg — ABNORMAL HIGH (ref 32–45)

## 2022-01-24 LAB — CBC
HCT: 41.7 % (ref 39.0–52.0)
Hemoglobin: 14.3 g/dL (ref 13.0–17.0)
MCH: 32.9 pg (ref 26.0–34.0)
MCHC: 34.3 g/dL (ref 30.0–36.0)
MCV: 95.9 fL (ref 80.0–100.0)
Platelets: 147 10*3/uL — ABNORMAL LOW (ref 150–400)
RBC: 4.35 MIL/uL (ref 4.22–5.81)
RDW: 14.8 % (ref 11.5–15.5)
WBC: 11.2 10*3/uL — ABNORMAL HIGH (ref 4.0–10.5)
nRBC: 0 % (ref 0.0–0.2)

## 2022-01-24 LAB — ETHANOL: Alcohol, Ethyl (B): <10 mg/dL (ref <10)

## 2022-01-24 LAB — SARS CORONAVIRUS 2 BY RT PCR: SARS Coronavirus 2 by RT PCR: NEGATIVE

## 2022-01-24 LAB — TROPONIN I (HIGH SENSITIVITY)
Troponin I (High Sensitivity): 21 ng/L — ABNORMAL HIGH (ref <18)
Troponin I (High Sensitivity): 65 ng/L — ABNORMAL HIGH (ref <18)

## 2022-01-24 MED ORDER — ACETAMINOPHEN 325 MG PO TABS
650.0000 mg | ORAL_TABLET | Freq: Four times a day (QID) | ORAL | Status: DC | PRN
  Administered 2022-01-26: 650 mg via ORAL
  Filled 2022-01-24: qty 2

## 2022-01-24 MED ORDER — MAGNESIUM SULFATE 2 GM/50ML IV SOLN
2.0000 g | Freq: Once | INTRAVENOUS | Status: AC
Start: 2022-01-24 — End: 2022-01-24
  Administered 2022-01-24: 2 g via INTRAVENOUS
  Filled 2022-01-24: qty 50

## 2022-01-24 MED ORDER — ALBUTEROL SULFATE (2.5 MG/3ML) 0.083% IN NEBU
2.5000 mg | INHALATION_SOLUTION | RESPIRATORY_TRACT | Status: DC | PRN
Start: 2022-01-24 — End: 2022-01-25

## 2022-01-24 MED ORDER — IPRATROPIUM BROMIDE 0.02 % IN SOLN
0.5000 mg | RESPIRATORY_TRACT | Status: DC
Start: 2022-01-25 — End: 2022-01-25
  Administered 2022-01-25 (×2): 0.5 mg via RESPIRATORY_TRACT
  Filled 2022-01-24 (×2): qty 2.5

## 2022-01-24 MED ORDER — ACETAMINOPHEN 650 MG RE SUPP: 650.0000 mg | Freq: Four times a day (QID) | RECTAL | Status: DC | PRN

## 2022-01-24 MED ORDER — SODIUM CHLORIDE 0.9 % IV SOLN
500.0000 mg | INTRAVENOUS | Status: AC
  Administered 2022-01-25: 500 mg via INTRAVENOUS
  Filled 2022-01-24: qty 5

## 2022-01-24 MED ORDER — METHYLPREDNISOLONE SODIUM SUCC 125 MG IJ SOLR
125.0000 mg | Freq: Once | INTRAMUSCULAR | Status: AC
  Administered 2022-01-24: 125 mg via INTRAVENOUS
  Filled 2022-01-24: qty 2

## 2022-01-24 MED ORDER — ASPIRIN 81 MG PO TBEC
81.0000 mg | DELAYED_RELEASE_TABLET | Freq: Every day | ORAL | Status: DC
  Administered 2022-01-25 – 2022-01-27 (×3): 81 mg via ORAL
  Filled 2022-01-24 (×5): qty 1

## 2022-01-24 MED ORDER — IPRATROPIUM-ALBUTEROL 0.5-2.5 (3) MG/3ML IN SOLN
3.0000 mL | Freq: Once | RESPIRATORY_TRACT | Status: AC
  Administered 2022-01-24: 3 mL via RESPIRATORY_TRACT
  Filled 2022-01-24: qty 3

## 2022-01-24 MED ORDER — ALBUTEROL SULFATE (2.5 MG/3ML) 0.083% IN NEBU
10.0000 mg | INHALATION_SOLUTION | Freq: Once | RESPIRATORY_TRACT | Status: AC
  Administered 2022-01-24: 10 mg via RESPIRATORY_TRACT
  Filled 2022-01-24: qty 12

## 2022-01-24 MED ORDER — ENOXAPARIN SODIUM 300 MG/3ML IJ SOLN
20.0000 mg | INTRAMUSCULAR | Status: DC
Start: 2022-01-25 — End: 2022-01-25
  Filled 2022-01-24: qty 0.2

## 2022-01-24 MED ORDER — METHYLPREDNISOLONE SODIUM SUCC 125 MG IJ SOLR
80.0000 mg | INTRAMUSCULAR | Status: DC
  Administered 2022-01-25 – 2022-01-26 (×2): 80 mg via INTRAVENOUS
  Filled 2022-01-24 (×2): qty 2

## 2022-01-24 MED ORDER — AZITHROMYCIN 500 MG PO TABS
500.0000 mg | ORAL_TABLET | Freq: Every day | ORAL | Status: DC
  Administered 2022-01-26 – 2022-01-27 (×2): 500 mg via ORAL
  Filled 2022-01-24 (×2): qty 1

## 2022-01-24 MED ORDER — BUDESONIDE 0.25 MG/2ML IN SUSP
0.2500 mg | Freq: Two times a day (BID) | RESPIRATORY_TRACT | Status: DC
  Administered 2022-01-25 – 2022-01-27 (×5): 0.25 mg via RESPIRATORY_TRACT
  Filled 2022-01-24 (×6): qty 2

## 2022-01-24 MED ORDER — ALBUTEROL SULFATE (2.5 MG/3ML) 0.083% IN NEBU
2.5000 mg | INHALATION_SOLUTION | RESPIRATORY_TRACT | Status: DC
Start: 2022-01-25 — End: 2022-01-25
  Administered 2022-01-25 (×2): 2.5 mg via RESPIRATORY_TRACT
  Filled 2022-01-24 (×2): qty 3

## 2022-01-24 NOTE — ED Provider Notes (Signed)
[facility] EMERGENCY DEPARTMENT Provider Note   CSN: [id number] Arrival date & time: 01/24/22  1429     History  Chief Complaint  Patient presents with   Chest Pain   Shortness of Breath    Jaime Cline is a 61 y.o. male.  Patient here with shortness of breath, chest pain.  Feels like his COPD is acting up.  Has had some fevers and cough.  States he has been compliant with his meds.  States his last alcohol use was 3 days ago.  Does not drink daily he states.  He is not concerned that he is withdrawing from alcohol.  He denies any weakness, numbness, tingling.  He feels like he is having a hard time breathing and has significant shortness of breath with minimal exertion.  The history is provided by the patient.       Home Medications Prior to Admission medications   Medication Sig Start Date End Date Taking? Authorizing Provider  albuterol (VENTOLIN HFA) 108 (90 Base) MCG/ACT inhaler Inhale 1-2 puffs into the lungs every 6 (six) hours as needed for wheezing. 01/10/22   Garlon Hatchet, PA-C  atorvastatin (LIPITOR) 20 MG tablet Take 1 tablet (20 mg total) by mouth daily. 08/29/21   Champ Mungo, DO  famotidine (PEPCID) 20 MG tablet Take 20 mg by mouth 2 (two) times daily.    [provider]  Fluticasone-Salmeterol (ADVAIR) 100-50 MCG/DOSE AEPB Inhale 1 puff into the lungs 2 (two) times daily. 08/29/21   Champ Mungo, DO  sertraline (ZOLOFT) 50 MG tablet Take 1 tablet (50 mg total) by mouth daily. 01/10/22   Garlon Hatchet, PA-C  albuterol (PROVENTIL,VENTOLIN) 90 MCG/ACT inhaler Inhale 2 puffs into the lungs every 6 (six) hours as needed. Shortness of breath and wheezing   03/22/12  [provider]      Allergies    Other    Review of Systems   Review of Systems  Physical Exam Updated Vital Signs BP 102/81   Pulse 97   Temp 97.8 F (36.6 C)   Resp 19   SpO2 99%  Physical Exam Vitals and nursing note reviewed.  Constitutional:       Appearance: He is well-developed.     Comments: Thin, cachectic  HENT:     Head: Normocephalic and atraumatic.  Eyes:     Conjunctiva/sclera: Conjunctivae normal.  Cardiovascular:     Rate and Rhythm: Normal rate and regular rhythm.     Pulses:          Radial pulses are 2+ on the right side and 2+ on the left side.     Heart sounds: Normal heart sounds. No murmur heard. Pulmonary:     Effort: Tachypnea present. No respiratory distress.     Breath sounds: Decreased breath sounds present.     Comments: Increased work of breathing, poor air movement, diminished throughout Abdominal:     Palpations: Abdomen is soft.     Tenderness: There is no abdominal tenderness.  Musculoskeletal:        General: No swelling.     Cervical back: Neck supple.     Right lower leg: No edema.     Left lower leg: No edema.  Skin:    General: Skin is warm and dry.     Capillary Refill: Capillary refill takes less than 2 seconds.  Neurological:     Mental Status: He is alert.  Psychiatric:  Mood and Affect: Mood normal.     ED Results / Procedures / Treatments   Labs (all labs ordered are listed, but only abnormal results are displayed) Labs Reviewed  BASIC METABOLIC PANEL - Abnormal; Notable for the following components:      Result Value   Sodium 134 (*)    CO2 18 (*)    Anion gap 18 (*)    All other components within normal limits  CBC - Abnormal; Notable for the following components:   WBC 11.2 (*)    Platelets 147 (*)    All other components within normal limits  BASIC METABOLIC PANEL - Abnormal; Notable for the following components:   Sodium 134 (*)    Chloride 96 (*)    CO2 18 (*)    Anion gap 20 (*)    All other components within normal limits  I-STAT VENOUS BLOOD GAS, ED - Abnormal; Notable for the following components:   pCO2, Ven 37.3 (*)    pO2, Ven 65 (*)    Acid-base deficit 3.0 (*)    Sodium 133 (*)    Calcium, Ion 1.09 (*)    All other components within normal  limits  TROPONIN I (HIGH SENSITIVITY) - Abnormal; Notable for the following components:   Troponin I (High Sensitivity) 21 (*)    All other components within normal limits  TROPONIN I (HIGH SENSITIVITY) - Abnormal; Notable for the following components:   Troponin I (High Sensitivity) 65 (*)    All other components within normal limits  SARS CORONAVIRUS 2 BY RT PCR  ETHANOL    EKG EKG Interpretation  Date/Time:  Tuesday January 24 2022 15:41:43 EDT Ventricular Rate:  131 PR Interval:  114 QRS Duration: 74 QT Interval:  304 QTC Calculation: 448 R Axis:   96 Text Interpretation: Sinus tachycardia Right atrial enlargement Rightward axis Pulmonary disease pattern Abnormal ECG When compared with ECG of 09-Jan-2022 23:42, PREVIOUS ECG IS PRESENT Confirmed by Virgina Norfolk ([id number]) on 01/24/2022 8:42:17 PM  Radiology DG Ribs Unilateral W/Chest Left  Result Date: 01/24/2022 CLINICAL DATA:  Left-sided chest pain for 2 days, pain with inspiration and movement EXAM: LEFT RIBS AND CHEST - 3+ VIEW COMPARISON:  01/09/2022 FINDINGS: Frontal view of the chest as well as frontal and oblique views of the left thoracic cage are obtained. Cardiac silhouette is unremarkable. The lungs are hyperinflated with background emphysema. No acute airspace disease, effusion, or pneumothorax. There are no acute displaced rib fractures. IMPRESSION: 1. Emphysema.  No acute airspace disease. 2. No displaced rib fracture. Electronically Signed   By: Sharlet Salina M.D.   On: 01/24/2022 16:22    Procedures Procedures    Medications Ordered in ED Medications  ipratropium-albuterol (DUONEB) 0.5-2.5 (3) MG/3ML nebulizer solution 3 mL (3 mLs Nebulization Given 01/24/22 2131)  methylPREDNISolone sodium succinate (SOLU-MEDROL) 125 mg/2 mL injection 125 mg (125 mg Intravenous Given 01/24/22 2134)  magnesium sulfate IVPB 2 g 50 mL (2 g Intravenous New Bag/Given 01/24/22 2135)  albuterol (PROVENTIL) (2.5 MG/3ML) 0.083% nebulizer solution  10 mg (10 mg Nebulization Given 01/24/22 2226)    ED Course/ Medical Decision Making/ A&P                           Medical Decision Making Amount and/or Complexity of Data Reviewed Labs: ordered.  Risk Prescription drug management. Decision regarding hospitalization.   Jenelle Mages is here with chest pain and shortness of breath.  Symptoms  for last 2 days.  History of COPD, alcohol abuse.  He has increased work of breathing on exam, diminished breath sounds throughout with poor air movement.  Current every day smoker.  Not have much chest discomfort but feels like when he walks short distances he has a lot of shortness of breath.  His last alcoholic beverage was 3 days ago.  He denies any withdrawal symptoms.  Vital signs are unremarkable except for he is got sinus tachycardia.  EKG shows sinus tachycardia with no ischemic changes per my review and interpretation of EKG.  Differential diagnosis is likely COPD exacerbation, possibly viral process/pneumonia versus less likely ACS.  Will give DuoNeb treatment, IV Solu-Medrol, IV magnesium.  He is already had a CBC, BMP, troponin, chest x-ray done prior to my evaluation.  Troponin mildly elevated at 21 per my review and interpretation.  Chest x-ray with no signs of pneumonia or pneumothorax per my review interpretation.  Otherwise no significant anemia, electrolyte abnormality, kidney injury.  Will get venous blood gas, trend troponin, give breathing treatments and steroids and reevaluate.  My suspicion is that this is a COPD exacerbation.  Troponin continues to trend upward to 65.  Alcohol level is unremarkable.  Blood gas is reassuring with a pH of 7.3.  CO2 of 77 he continues with mild acidosis with a bicarb of 18.  Will be admitted to medicine for further care.  Repeat EKG shows sinus rhythm.  No ischemic changes.  This chart was dictated using voice recognition software.  Despite best efforts to proofread,  errors can occur which can change the  documentation meaning.         Final Clinical Impression(s) / ED Diagnoses Final diagnoses:  COPD with acute exacerbation (HCC)  Elevated troponin    Rx / DC Orders ED Discharge Orders     None         Virgina Norfolk, DO 01/24/22 2236

## 2022-01-24 NOTE — H&P (Signed)
History and Physical    Jaime Cline TIR:[id number] DOB: 08-05-1960 DOA: 01/24/2022  PCP: Pcp, No  Patient coming from: Home.  Chief Complaint: Shortness of breath.  HPI: Jaime Cline is a 61 y.o. male with history of COPD with ongoing tobacco abuse presents to the ER with complaints of worsening shortness of breath for the last 2 days.  Denies any chest pain fever chills.  Has been having some productive cough.  Had come to the ER about 2 weeks ago when the patient was treated symptomatically.  Patient states then he felt better but the shortness of breath again came back over the last 2 days.  Patient also states that he had intermittent subjective feeling of fever and chills.  ED Course: In the ER patient is found to be diffusely wheezing tachycardic chest x-ray does not show anything acute.  Despite steroids and nebulizer patient was still short of breath and wheezing admitted for further management.  High sensitive troponin has increased from 21 and it is around 65 now.  Review of Systems: As per HPI, rest all negative.   Past Medical History:  Diagnosis Date   Alcohol abuse    Asthma    Bronchitis    Chronic back pain    Chronic knee pain    Hypertension     History reviewed. No pertinent surgical history.   reports that he has been smoking cigarettes. He has been smoking an average of 1 pack per day. He does not have any smokeless tobacco history on file. He reports current alcohol use of about 28.0 standard drinks of alcohol per week. He reports that he does not use drugs.  Allergies  Allergen Reactions   Other Other (See Comments)    BP med from [facility] about 3 years ago, caused some seizures, due to getting wrong BP med at that time.  Unknown name of med    Family History  Problem Relation Age of Onset   Cancer Mother    Cancer Sister     Prior to Admission medications   Medication Sig Start Date End Date Taking? Authorizing Provider  albuterol (VENTOLIN HFA) 108  (90 Base) MCG/ACT inhaler Inhale 1-2 puffs into the lungs every 6 (six) hours as needed for wheezing. 01/10/22   Jaime Hatchet, PA-C  atorvastatin (LIPITOR) 20 MG tablet Take 1 tablet (20 mg total) by mouth daily. 08/29/21   Champ Mungo, DO  famotidine (PEPCID) 20 MG tablet Take 20 mg by mouth 2 (two) times daily.    [provider]  Fluticasone-Salmeterol (ADVAIR) 100-50 MCG/DOSE AEPB Inhale 1 puff into the lungs 2 (two) times daily. 08/29/21   Champ Mungo, DO  sertraline (ZOLOFT) 50 MG tablet Take 1 tablet (50 mg total) by mouth daily. 01/10/22   Jaime Hatchet, PA-C  albuterol (PROVENTIL,VENTOLIN) 90 MCG/ACT inhaler Inhale 2 puffs into the lungs every 6 (six) hours as needed. Shortness of breath and wheezing   03/22/12  [provider]    Physical Exam: Constitutional: Moderately built and poorly nourished. Vitals:   01/24/22 2110 01/24/22 2154 01/24/22 2215 01/24/22 2226  BP:  107/76 102/81   Pulse: 99 98 97   Resp: 17 19 19    Temp:      TempSrc:      SpO2: 100% 100% 99% 99%   Eyes: Anicteric no pallor. ENMT: No discharge from the ears eyes nose and mouth. Neck: No mass felt.  No neck rigidity. Respiratory: Bilateral expiratory wheeze in all  crepitations. Cardiovascular: S1-S2 heard. Abdomen: Soft nontender bowel sound present. Musculoskeletal: No edema. Skin: No rash. Neurologic: Alert awake oriented time place and person.  Moves all extremities. Psychiatric: Appears normal.  Normal affect.   Labs on Admission: I have personally reviewed following labs and imaging studies  CBC: Recent Labs  Lab 01/24/22 1549 01/24/22 2138  WBC 11.2*  --   HGB 14.3 16.3  HCT 41.7 48.0  MCV 95.9  --   PLT 147*  --    Basic Metabolic Panel: Recent Labs  Lab 01/24/22 1549 01/24/22 2124 01/24/22 2138  NA 134* 134* 133*  K 4.4 4.6 4.5  CL 98 96*  --   CO2 18* 18*  --   GLUCOSE 85 83  --   BUN 13 17  --   CREATININE 0.92 1.09  --   CALCIUM 9.8 9.8  --     GFR: CrCl cannot be calculated (Unknown ideal weight.). Liver Function Tests: No results for input(s): "AST", "ALT", "ALKPHOS", "BILITOT", "PROT", "ALBUMIN" in the last 168 hours. No results for input(s): "LIPASE", "AMYLASE" in the last 168 hours. No results for input(s): "AMMONIA" in the last 168 hours. Coagulation Profile: No results for input(s): "INR", "PROTIME" in the last 168 hours. Cardiac Enzymes: No results for input(s): "CKTOTAL", "CKMB", "CKMBINDEX", "TROPONINI" in the last 168 hours. BNP (last 3 results) No results for input(s): "PROBNP" in the last 8760 hours. HbA1C: No results for input(s): "HGBA1C" in the last 72 hours. CBG: No results for input(s): "GLUCAP" in the last 168 hours. Lipid Profile: No results for input(s): "CHOL", "HDL", "LDLCALC", "TRIG", "CHOLHDL", "LDLDIRECT" in the last 72 hours. Thyroid Function Tests: No results for input(s): "TSH", "T4TOTAL", "FREET4", "T3FREE", "THYROIDAB" in the last 72 hours. Anemia Panel: No results for input(s): "VITAMINB12", "FOLATE", "FERRITIN", "TIBC", "IRON", "RETICCTPCT" in the last 72 hours. Urine analysis:    Component Value Date/Time   COLORURINE YELLOW 09/26/2021 1702   APPEARANCEUR CLEAR 09/26/2021 1702   LABSPEC 1.011 09/26/2021 1702   PHURINE 6.0 09/26/2021 1702   GLUCOSEU NEGATIVE 09/26/2021 1702   HGBUR SMALL (A) 09/26/2021 1702   BILIRUBINUR NEGATIVE 09/26/2021 1702   KETONESUR NEGATIVE 09/26/2021 1702   PROTEINUR NEGATIVE 09/26/2021 1702   UROBILINOGEN 0.2 03/27/2014 0158   NITRITE NEGATIVE 09/26/2021 1702   LEUKOCYTESUR NEGATIVE 09/26/2021 1702   Sepsis Labs: @LABRCNTIP (procalcitonin:4,lacticidven:4) ) Recent Results (from the past 240 hour(s))  SARS Coronavirus 2 by RT PCR (hospital order, performed in [facility] hospital lab) *cepheid single result test* Anterior Nasal Swab     Status: None   Collection Time: 01/24/22  9:06 PM   Specimen: Anterior Nasal Swab  Result Value Ref Range Status    SARS Coronavirus 2 by RT PCR NEGATIVE NEGATIVE Final    Comment: (NOTE) SARS-CoV-2 target nucleic acids are NOT DETECTED.  The SARS-CoV-2 RNA is generally detectable in upper and lower respiratory specimens during the acute phase of infection. The lowest concentration of SARS-CoV-2 viral copies this assay can detect is 250 copies / mL. A negative result does not preclude SARS-CoV-2 infection and should not be used as the sole basis for treatment or other patient management decisions.  A negative result may occur with improper specimen collection / handling, submission of specimen other than nasopharyngeal swab, presence of viral mutation(s) within the areas targeted by this assay, and inadequate number of viral copies (<250 copies / mL). A negative result must be combined with clinical observations, patient history, and epidemiological information.  Fact Sheet for Patients:  RoadLapTop.co.za  Fact Sheet for Healthcare Providers: http://kim-miller.com/  This test is not yet approved or  cleared by the [place] FDA and has been authorized for detection and/or diagnosis of SARS-CoV-2 by FDA under an Emergency Use Authorization (EUA).  This EUA will remain in effect (meaning this test can be used) for the duration of the COVID-19 declaration under Section 564(b)(1) of the Act, 21 U.S.C. section 360bbb-3(b)(1), unless the authorization is terminated or revoked sooner.  Performed at [facility], [address], [place], [place] [address]      Radiological Exams on Admission: DG Ribs Unilateral W/Chest Left  Result Date: 01/24/2022 CLINICAL DATA:  Left-sided chest pain for 2 days, pain with inspiration and movement EXAM: LEFT RIBS AND CHEST - 3+ VIEW COMPARISON:  01/09/2022 FINDINGS: Frontal view of the chest as well as frontal and oblique views of the left thoracic cage are obtained. Cardiac silhouette is unremarkable. The lungs  are hyperinflated with background emphysema. No acute airspace disease, effusion, or pneumothorax. There are no acute displaced rib fractures. IMPRESSION: 1. Emphysema.  No acute airspace disease. 2. No displaced rib fracture. Electronically Signed   By: Sharlet Salina M.D.   On: 01/24/2022 16:22    EKG: Independently reviewed.  Normal sinus rhythm.  Assessment/Plan Principal Problem:   COPD exacerbation (HCC)    Acute COPD exacerbation for which patient has been placed on IV Solu-Medrol Pulmicort nebulizer and antibiotics.  Closely monitor respiratory status. Elevated troponins with no chest pain.  Will trend cardiac markers.  Last 2D echo done in March 2023 was showing EF of 55 to 60%.  Will keep patient on aspirin.  Check D-dimer. Renal insufficiency diagnosed in April of this year.  Patient has not been taking any steroids since his discharge last time.  Closely monitor. Alcohol abuse has been mentioned in the chart.  Patient states he has not had any alcohol recently.  Closely monitor. Tobacco abuse advised about quitting.   DVT prophylaxis: Lovenox. Code Status: Full code. Family Communication: Discussed with patient. Disposition Plan: Home. Consults called: None. Admission status: Observation.   Eduard Clos MD [facility] Pager [phone].  If 7PM-7AM, please contact night-coverage [email or web address] Password [facility]  01/24/2022, 11:47 PM

## 2022-01-24 NOTE — ED Provider Triage Note (Signed)
Emergency Medicine Provider Triage Evaluation Note  FORD PEDDIE , a 61 y.o. male  was evaluated in triage.  Pt complains of left-sided chest pain that started 2 days ago.  Patient describes it as if "somebody hit him with a stick".  Pain is worse with movement, palpation and with deep inspiration.  No previous history of cardiac disease or blood clots although he does have history of COPD and is a current smoker.  Denies numbness and tingling.  Review of Systems  Positive:  Negative:   Physical Exam  BP (!) 120/97 (BP Location: Right Arm)   Pulse (!) 132   Temp (!) 97.3 F (36.3 C) (Oral)   Resp 20   SpO2 98%  Gen:   Awake, no distress   Resp:  Normal effort  MSK:   Moves extremities without difficulty  Other:  Reproducible tenderness on the left lower lateral rib cage.  Medical Decision Making  Medically screening exam initiated at 3:51 PM.  Appropriate orders placed.  Jenelle Mages was informed that the remainder of the evaluation will be completed by another provider, this initial triage assessment does not replace that evaluation, and the importance of remaining in the ED until their evaluation is complete.     Janell Quiet, [place] 01/24/22 1552

## 2022-01-24 NOTE — ED Triage Notes (Signed)
Pt c/o CP, SHOB, intermittent fever x2 days, tylenol for fever "yesterday." Hx COPD, compliant w all home meds

## 2022-01-25 DIAGNOSIS — J441 Chronic obstructive pulmonary disease with (acute) exacerbation: Secondary | ICD-10-CM | POA: Diagnosis not present

## 2022-01-25 LAB — CBC
HCT: 39.4 % (ref 39.0–52.0)
HCT: 34.9 % — ABNORMAL LOW (ref 39.0–52.0)
Hemoglobin: 13.4 g/dL (ref 13.0–17.0)
Hemoglobin: 12.5 g/dL — ABNORMAL LOW (ref 13.0–17.0)
MCH: 32.4 pg (ref 26.0–34.0)
MCH: 33.2 pg (ref 26.0–34.0)
MCHC: 34 g/dL (ref 30.0–36.0)
MCHC: 35.8 g/dL (ref 30.0–36.0)
MCV: 95.4 fL (ref 80.0–100.0)
MCV: 92.8 fL (ref 80.0–100.0)
Platelets: 156 10*3/uL (ref 150–400)
Platelets: 155 10*3/uL (ref 150–400)
RBC: 4.13 MIL/uL — ABNORMAL LOW (ref 4.22–5.81)
RBC: 3.76 MIL/uL — ABNORMAL LOW (ref 4.22–5.81)
RDW: 14.6 % (ref 11.5–15.5)
RDW: 14.6 % (ref 11.5–15.5)
WBC: 7.5 10*3/uL (ref 4.0–10.5)
WBC: 4.4 10*3/uL (ref 4.0–10.5)
nRBC: 0 % (ref 0.0–0.2)
nRBC: 0 % (ref 0.0–0.2)

## 2022-01-25 LAB — BASIC METABOLIC PANEL
Anion gap: 13 (ref 5–15)
BUN: 16 mg/dL (ref 8–23)
CO2: 25 mmol/L (ref 22–32)
Calcium: 9 mg/dL (ref 8.9–10.3)
Chloride: 93 mmol/L — ABNORMAL LOW (ref 98–111)
Creatinine, Ser: 1.05 mg/dL (ref 0.61–1.24)
GFR, Estimated: >60 mL/min (ref >60)
Glucose, Bld: 294 mg/dL — ABNORMAL HIGH (ref 70–99)
Potassium: 4.5 mmol/L (ref 3.5–5.1)
Sodium: 131 mmol/L — ABNORMAL LOW (ref 135–145)

## 2022-01-25 LAB — CREATININE, SERUM
Creatinine, Ser: 1.22 mg/dL (ref 0.61–1.24)
GFR, Estimated: >60 mL/min (ref >60)

## 2022-01-25 LAB — TROPONIN I (HIGH SENSITIVITY)
Troponin I (High Sensitivity): 40 ng/L — ABNORMAL HIGH (ref <18)
Troponin I (High Sensitivity): 23 ng/L — ABNORMAL HIGH (ref <18)

## 2022-01-25 LAB — TSH: TSH: 0.399 u[IU]/mL (ref 0.350–4.500)

## 2022-01-25 LAB — D-DIMER, QUANTITATIVE: D-Dimer, Quant: 0.92 ug/mL-FEU — ABNORMAL HIGH (ref 0.00–0.50)

## 2022-01-25 MED ORDER — IPRATROPIUM-ALBUTEROL 0.5-2.5 (3) MG/3ML IN SOLN: 3.0000 mL | RESPIRATORY_TRACT | Status: DC | PRN

## 2022-01-25 MED ORDER — ENSURE ENLIVE PO LIQD
237.0000 mL | Freq: Two times a day (BID) | ORAL | Status: DC
Start: 2022-01-25 — End: 2022-01-27
  Administered 2022-01-25 – 2022-01-27 (×3): 237 mL via ORAL
  Filled 2022-01-25 (×2): qty 237

## 2022-01-25 MED ORDER — DM-GUAIFENESIN ER 30-600 MG PO TB12
1.0000 | ORAL_TABLET | Freq: Two times a day (BID) | ORAL | Status: DC
  Administered 2022-01-25 – 2022-01-27 (×5): 1 via ORAL
  Filled 2022-01-25 (×7): qty 1

## 2022-01-25 MED ORDER — MELATONIN 3 MG PO TABS
3.0000 mg | ORAL_TABLET | Freq: Every evening | ORAL | Status: DC | PRN
  Administered 2022-01-25 – 2022-01-26 (×2): 3 mg via ORAL
  Filled 2022-01-25 (×2): qty 1

## 2022-01-25 MED ORDER — GUAIFENESIN 100 MG/5ML PO LIQD
5.0000 mL | ORAL | Status: DC | PRN
  Administered 2022-01-25: 5 mL via ORAL
  Filled 2022-01-25 (×2): qty 5

## 2022-01-25 MED ORDER — IPRATROPIUM-ALBUTEROL 0.5-2.5 (3) MG/3ML IN SOLN
3.0000 mL | Freq: Four times a day (QID) | RESPIRATORY_TRACT | Status: DC
  Administered 2022-01-25 – 2022-01-27 (×8): 3 mL via RESPIRATORY_TRACT
  Filled 2022-01-25 (×8): qty 3

## 2022-01-25 MED ORDER — ENOXAPARIN SODIUM 300 MG/3ML IJ SOLN
20.0000 mg | Freq: Every day | INTRAMUSCULAR | Status: DC
  Administered 2022-01-25 – 2022-01-26 (×2): 20 mg via SUBCUTANEOUS
  Filled 2022-01-25 (×3): qty 0.2

## 2022-01-25 NOTE — Progress Notes (Signed)
PROGRESS NOTE    Jaime Cline  SNK:[id number] DOB: [birth year] DOA: 01/24/2022 PCP: Pcp, No   Brief Narrative:  61 y.o. male with history of COPD with ongoing tobacco abuse presents to the ER with complaints of worsening shortness of breath for the last 2 days.  He is admitted for COPD exacerbation.   Assessment & Plan:  Principal Problem:   COPD exacerbation (HCC)    Acute COPD exacerbation Active tobacco use - On IV steroids.  Scheduled and as needed bronchodilators.  Pulmicort.  I-S/flutter valve supportive care.  Empiric azithromycin.  Elevated troponin - Initial troponin 65, trended down to 23.  EKG is nonischemic, follow-up outpatient  Renal insufficiency - Around baseline creatinine 1.0  History of alcohol use - Monitor for any withdrawal   DVT prophylaxis: Lovenox Code Status: Full code Family Communication:    Maintain hospital stay, patient still has severely abnormal breath sounds requiring aggressive bronchodilator treatment.   Subjective: Feels slightly better compared to yesterday but still has significant amount of coughing and exertional shortness of breath.    Examination:  General exam: Appears calm and comfortable  Respiratory system: Diffuse bilateral rhonchi Cardiovascular system: S1 & S2 heard, RRR. No JVD, murmurs, rubs, gallops or clicks. No pedal edema. Gastrointestinal system: Abdomen is nondistended, soft and nontender. No organomegaly or masses felt. Normal bowel sounds heard. Central nervous system: Alert and oriented. No focal neurological deficits. Extremities: Symmetric 5 x 5 power. Skin: No rashes, lesions or ulcers Psychiatry: Judgement and insight appear normal. Mood & affect appropriate.     Objective: Vitals:   01/25/22 0330 01/25/22 0400 01/25/22 0607 01/25/22 0800  BP: 104/71 97/68 97/68  94/64  Pulse: 87 93 89 90  Resp: (!) 24 17 19 20   Temp:  98 F (36.7 C)  98 F (36.7 C)  TempSrc:  Oral  Oral  SpO2: 97% 98% 99%  96%  Weight:      Height:       No intake or output data in the 24 hours ending 01/25/22 0902 Filed Weights   01/25/22 0000  Weight: 45.4 kg     Data Reviewed:   CBC: Recent Labs  Lab 01/24/22 1549 01/24/22 2138 01/25/22 0115 01/25/22 0516  WBC 11.2*  --  7.5 4.4  HGB 14.3 16.3 13.4 12.5*  HCT 41.7 48.0 39.4 34.9*  MCV 95.9  --  95.4 92.8  PLT 147*  --  156 155   Basic Metabolic Panel: Recent Labs  Lab 01/24/22 1549 01/24/22 2124 01/24/22 2138 01/25/22 0115 01/25/22 0516  NA 134* 134* 133*  --  131*  K 4.4 4.6 4.5  --  4.5  CL 98 96*  --   --  93*  CO2 18* 18*  --   --  25  GLUCOSE 85 83  --   --  294*  BUN 13 17  --   --  16  CREATININE 0.92 1.09  --  1.22 1.05  CALCIUM 9.8 9.8  --   --  9.0   GFR: Estimated Creatinine Clearance: 47.4 mL/min (by C-G formula based on SCr of 1.05 mg/dL). Liver Function Tests: No results for input(s): "AST", "ALT", "ALKPHOS", "BILITOT", "PROT", "ALBUMIN" in the last 168 hours. No results for input(s): "LIPASE", "AMYLASE" in the last 168 hours. No results for input(s): "AMMONIA" in the last 168 hours. Coagulation Profile: No results for input(s): "INR", "PROTIME" in the last 168 hours. Cardiac Enzymes: No results for input(s): "CKTOTAL", "CKMB", "CKMBINDEX", "TROPONINI" in the  last 168 hours. BNP (last 3 results) No results for input(s): "PROBNP" in the last 8760 hours. HbA1C: No results for input(s): "HGBA1C" in the last 72 hours. CBG: No results for input(s): "GLUCAP" in the last 168 hours. Lipid Profile: No results for input(s): "CHOL", "HDL", "LDLCALC", "TRIG", "CHOLHDL", "LDLDIRECT" in the last 72 hours. Thyroid Function Tests: Recent Labs    01/25/22 0115  TSH 0.399   Anemia Panel: No results for input(s): "VITAMINB12", "FOLATE", "FERRITIN", "TIBC", "IRON", "RETICCTPCT" in the last 72 hours. Sepsis Labs: No results for input(s): "PROCALCITON", "LATICACIDVEN" in the last 168 hours.  Recent Results (from the  past 240 hour(s))  SARS Coronavirus 2 by RT PCR (hospital order, performed in [facility] hospital lab) *cepheid single result test* Anterior Nasal Swab     Status: None   Collection Time: 01/24/22  9:06 PM   Specimen: Anterior Nasal Swab  Result Value Ref Range Status   SARS Coronavirus 2 by RT PCR NEGATIVE NEGATIVE Final    Comment: (NOTE) SARS-CoV-2 target nucleic acids are NOT DETECTED.  The SARS-CoV-2 RNA is generally detectable in upper and lower respiratory specimens during the acute phase of infection. The lowest concentration of SARS-CoV-2 viral copies this assay can detect is 250 copies / mL. A negative result does not preclude SARS-CoV-2 infection and should not be used as the sole basis for treatment or other patient management decisions.  A negative result may occur with improper specimen collection / handling, submission of specimen other than nasopharyngeal swab, presence of viral mutation(s) within the areas targeted by this assay, and inadequate number of viral copies (<250 copies / mL). A negative result must be combined with clinical observations, patient history, and epidemiological information.  Fact Sheet for Patients:   RoadLapTop.co.za  Fact Sheet for Healthcare Providers: http://kim-miller.com/  This test is not yet approved or  cleared by the [place] FDA and has been authorized for detection and/or diagnosis of SARS-CoV-2 by FDA under an Emergency Use Authorization (EUA).  This EUA will remain in effect (meaning this test can be used) for the duration of the COVID-19 declaration under Section 564(b)(1) of the Act, 21 U.S.C. section 360bbb-3(b)(1), unless the authorization is terminated or revoked sooner.  Performed at [facility], [address] [place], [place] [address]          Radiology Studies: DG Ribs Unilateral W/Chest Left  Result Date: 01/24/2022 CLINICAL DATA:  Left-sided chest  pain for 2 days, pain with inspiration and movement EXAM: LEFT RIBS AND CHEST - 3+ VIEW COMPARISON:  01/09/2022 FINDINGS: Frontal view of the chest as well as frontal and oblique views of the left thoracic cage are obtained. Cardiac silhouette is unremarkable. The lungs are hyperinflated with background emphysema. No acute airspace disease, effusion, or pneumothorax. There are no acute displaced rib fractures. IMPRESSION: 1. Emphysema.  No acute airspace disease. 2. No displaced rib fracture. Electronically Signed   By: Sharlet Salina M.D.   On: 01/24/2022 16:22        Scheduled Meds:  albuterol  2.5 mg Nebulization Q4H   aspirin EC  81 mg Oral Daily   [START ON 01/26/2022] azithromycin  500 mg Oral Daily   budesonide (PULMICORT) nebulizer solution  0.25 mg Nebulization BID   enoxaparin (LOVENOX) injection  20 mg Subcutaneous Q24H   feeding supplement  237 mL Oral BID BM   ipratropium  0.5 mg Nebulization Q4H   methylPREDNISolone (SOLU-MEDROL) injection  80 mg Intravenous Q24H   Continuous  Infusions:   LOS: 0 days   Time spent= 35 mins    Anton Cheramie Joline Maxcy, MD [facility]  If 7PM-7AM, please contact night-coverage  01/25/2022, 9:02 AM

## 2022-01-25 NOTE — ED Notes (Signed)
Call to pharmacy, spoke to [facility] regarding LOVENOX dosage . Will get corrected.

## 2022-01-26 DIAGNOSIS — F1090 Alcohol use, unspecified, uncomplicated: Secondary | ICD-10-CM | POA: Diagnosis present

## 2022-01-26 DIAGNOSIS — Z79899 Other long term (current) drug therapy: Secondary | ICD-10-CM | POA: Diagnosis not present

## 2022-01-26 DIAGNOSIS — Z716 Tobacco abuse counseling: Secondary | ICD-10-CM | POA: Diagnosis not present

## 2022-01-26 DIAGNOSIS — Z79891 Long term (current) use of opiate analgesic: Secondary | ICD-10-CM | POA: Diagnosis not present

## 2022-01-26 DIAGNOSIS — Z888 Allergy status to other drugs, medicaments and biological substances status: Secondary | ICD-10-CM | POA: Diagnosis not present

## 2022-01-26 DIAGNOSIS — G8929 Other chronic pain: Secondary | ICD-10-CM | POA: Diagnosis present

## 2022-01-26 DIAGNOSIS — G4700 Insomnia, unspecified: Secondary | ICD-10-CM | POA: Diagnosis present

## 2022-01-26 DIAGNOSIS — Z20822 Contact with and (suspected) exposure to covid-19: Secondary | ICD-10-CM | POA: Diagnosis present

## 2022-01-26 DIAGNOSIS — F17210 Nicotine dependence, cigarettes, uncomplicated: Secondary | ICD-10-CM | POA: Diagnosis present

## 2022-01-26 DIAGNOSIS — N289 Disorder of kidney and ureter, unspecified: Secondary | ICD-10-CM | POA: Diagnosis present

## 2022-01-26 DIAGNOSIS — R778 Other specified abnormalities of plasma proteins: Secondary | ICD-10-CM | POA: Diagnosis present

## 2022-01-26 DIAGNOSIS — J441 Chronic obstructive pulmonary disease with (acute) exacerbation: Secondary | ICD-10-CM | POA: Diagnosis present

## 2022-01-26 LAB — BASIC METABOLIC PANEL
Anion gap: 8 (ref 5–15)
BUN: 20 mg/dL (ref 8–23)
CO2: 27 mmol/L (ref 22–32)
Calcium: 9.2 mg/dL (ref 8.9–10.3)
Chloride: 96 mmol/L — ABNORMAL LOW (ref 98–111)
Creatinine, Ser: 1.18 mg/dL (ref 0.61–1.24)
GFR, Estimated: >60 mL/min (ref >60)
Glucose, Bld: 167 mg/dL — ABNORMAL HIGH (ref 70–99)
Potassium: 3.9 mmol/L (ref 3.5–5.1)
Sodium: 131 mmol/L — ABNORMAL LOW (ref 135–145)

## 2022-01-26 LAB — MAGNESIUM: Magnesium: 1.6 mg/dL — ABNORMAL LOW (ref 1.7–2.4)

## 2022-01-26 MED ORDER — CALCIUM CARBONATE ANTACID 500 MG PO CHEW
1.0000 | CHEWABLE_TABLET | Freq: Three times a day (TID) | ORAL | Status: DC | PRN
  Administered 2022-01-26: 200 mg via ORAL
  Filled 2022-01-26: qty 1

## 2022-01-26 MED ORDER — METHYLPREDNISOLONE SODIUM SUCC 40 MG IJ SOLR
40.0000 mg | INTRAMUSCULAR | Status: DC
  Administered 2022-01-27: 40 mg via INTRAVENOUS
  Filled 2022-01-26: qty 1

## 2022-01-26 NOTE — Progress Notes (Signed)
PROGRESS NOTE    Jaime Cline  DPO:[id number] DOB: 1960-09-17 DOA: 01/24/2022 PCP: Pcp, No   Brief Narrative:  61 y.o. male with history of COPD with ongoing tobacco abuse presents to the ER with complaints of worsening shortness of breath for the last 2 days.  He is admitted for COPD exacerbation.   Assessment & Plan:  Principal Problem:   COPD exacerbation (HCC)    Acute COPD exacerbation Active tobacco use - On Solumedrol. Abnormal BS but slowly improving.  Scheduled and as needed bronchodilators.  Pulmicort.  I-S/flutter valve supportive care.  Empiric azithromycin.  Elevated troponin - Initial troponin 65, trended down to 23.  EKG is nonischemic, follow-up outpatient  Renal insufficiency - Around baseline creatinine 1.0  History of alcohol use - Monitor for any withdrawal   DVT prophylaxis: Lovenox Code Status: Full code Family Communication:    Maintain hospital stay, patient still has severely abnormal breath sounds requiring aggressive bronchodilator treatment.   Subjective: Still has some exertional SOB but slightly improved in last 24hrs.   Examination: Constitutional: Not in acute distress Respiratory: diminished BS b/l Cardiovascular: Normal sinus rhythm, no rubs Abdomen: Nontender nondistended good bowel sounds Musculoskeletal: No edema noted Skin: No rashes seen Neurologic: CN 2-12 grossly intact.  And nonfocal Psychiatric: Normal judgment and insight. Alert and oriented x 3. Normal mood.     Objective: Vitals:   01/26/22 0352 01/26/22 0550 01/26/22 0858 01/26/22 1330  BP: 115/71     Pulse: 80  78   Resp:   18   Temp: 97.8 F (36.6 C)     TempSrc: Oral     SpO2: 96%  99% 99%  Weight:  44.4 kg    Height:        Intake/Output Summary (Last 24 hours) at 01/26/2022 1346 Last data filed at 01/26/2022 0700 Gross per 24 hour  Intake --  Output 600 ml  Net -600 ml   Filed Weights   01/25/22 0000 01/26/22 0550  Weight: 45.4 kg 44.4 kg      Data Reviewed:   CBC: Recent Labs  Lab 01/24/22 1549 01/24/22 2138 01/25/22 0115 01/25/22 0516  WBC 11.2*  --  7.5 4.4  HGB 14.3 16.3 13.4 12.5*  HCT 41.7 48.0 39.4 34.9*  MCV 95.9  --  95.4 92.8  PLT 147*  --  156 155   Basic Metabolic Panel: Recent Labs  Lab 01/24/22 1549 01/24/22 2124 01/24/22 2138 01/25/22 0115 01/25/22 0516 01/26/22 0343  NA 134* 134* 133*  --  131* 131*  K 4.4 4.6 4.5  --  4.5 3.9  CL 98 96*  --   --  93* 96*  CO2 18* 18*  --   --  25 27  GLUCOSE 85 83  --   --  294* 167*  BUN 13 17  --   --  16 20  CREATININE 0.92 1.09  --  1.22 1.05 1.18  CALCIUM 9.8 9.8  --   --  9.0 9.2  MG  --   --   --   --   --  1.6*   GFR: Estimated Creatinine Clearance: 41.3 mL/min (by C-G formula based on SCr of 1.18 mg/dL). Liver Function Tests: No results for input(s): "AST", "ALT", "ALKPHOS", "BILITOT", "PROT", "ALBUMIN" in the last 168 hours. No results for input(s): "LIPASE", "AMYLASE" in the last 168 hours. No results for input(s): "AMMONIA" in the last 168 hours. Coagulation Profile: No results for input(s): "INR", "PROTIME" in the  last 168 hours. Cardiac Enzymes: No results for input(s): "CKTOTAL", "CKMB", "CKMBINDEX", "TROPONINI" in the last 168 hours. BNP (last 3 results) No results for input(s): "PROBNP" in the last 8760 hours. HbA1C: No results for input(s): "HGBA1C" in the last 72 hours. CBG: No results for input(s): "GLUCAP" in the last 168 hours. Lipid Profile: No results for input(s): "CHOL", "HDL", "LDLCALC", "TRIG", "CHOLHDL", "LDLDIRECT" in the last 72 hours. Thyroid Function Tests: Recent Labs    01/25/22 0115  TSH 0.399   Anemia Panel: No results for input(s): "VITAMINB12", "FOLATE", "FERRITIN", "TIBC", "IRON", "RETICCTPCT" in the last 72 hours. Sepsis Labs: No results for input(s): "PROCALCITON", "LATICACIDVEN" in the last 168 hours.  Recent Results (from the past 240 hour(s))  SARS Coronavirus 2 by RT PCR (hospital order,  performed in [facility] hospital lab) *cepheid single result test* Anterior Nasal Swab     Status: None   Collection Time: 01/24/22  9:06 PM   Specimen: Anterior Nasal Swab  Result Value Ref Range Status   SARS Coronavirus 2 by RT PCR NEGATIVE NEGATIVE Final    Comment: (NOTE) SARS-CoV-2 target nucleic acids are NOT DETECTED.  The SARS-CoV-2 RNA is generally detectable in upper and lower respiratory specimens during the acute phase of infection. The lowest concentration of SARS-CoV-2 viral copies this assay can detect is 250 copies / mL. A negative result does not preclude SARS-CoV-2 infection and should not be used as the sole basis for treatment or other patient management decisions.  A negative result may occur with improper specimen collection / handling, submission of specimen other than nasopharyngeal swab, presence of viral mutation(s) within the areas targeted by this assay, and inadequate number of viral copies (<250 copies / mL). A negative result must be combined with clinical observations, patient history, and epidemiological information.  Fact Sheet for Patients:   RoadLapTop.co.za  Fact Sheet for Healthcare Providers: http://kim-miller.com/  This test is not yet approved or  cleared by the [place] FDA and has been authorized for detection and/or diagnosis of SARS-CoV-2 by FDA under an Emergency Use Authorization (EUA).  This EUA will remain in effect (meaning this test can be used) for the duration of the COVID-19 declaration under Section 564(b)(1) of the Act, 21 U.S.C. section 360bbb-3(b)(1), unless the authorization is terminated or revoked sooner.  Performed at [facility], [address], [place], [place] [address]          Radiology Studies: DG Ribs Unilateral W/Chest Left  Result Date: 01/24/2022 CLINICAL DATA:  Left-sided chest pain for 2 days, pain with inspiration and movement EXAM: LEFT  RIBS AND CHEST - 3+ VIEW COMPARISON:  01/09/2022 FINDINGS: Frontal view of the chest as well as frontal and oblique views of the left thoracic cage are obtained. Cardiac silhouette is unremarkable. The lungs are hyperinflated with background emphysema. No acute airspace disease, effusion, or pneumothorax. There are no acute displaced rib fractures. IMPRESSION: 1. Emphysema.  No acute airspace disease. 2. No displaced rib fracture. Electronically Signed   By: Sharlet Salina M.D.   On: 01/24/2022 16:22        Scheduled Meds:  aspirin EC  81 mg Oral Daily   azithromycin  500 mg Oral Daily   budesonide (PULMICORT) nebulizer solution  0.25 mg Nebulization BID   dextromethorphan-guaiFENesin  1 tablet Oral BID   enoxaparin (LOVENOX) injection  20 mg Subcutaneous Daily   feeding supplement  237 mL Oral BID BM   ipratropium-albuterol  3 mL Nebulization Q6H  methylPREDNISolone (SOLU-MEDROL) injection  80 mg Intravenous Q24H   Continuous Infusions:   LOS: 0 days   Time spent= 35 mins    Kannon Granderson Joline Maxcy, MD [facility]  If 7PM-7AM, please contact night-coverage  01/26/2022, 1:46 PM

## 2022-01-26 NOTE — Progress Notes (Signed)
  Transition of Care [facility]) Screening Note   Patient Details  Name: CAID RADIN Date of Birth: 1960/10/07   Transition of Care [facility]) CM/SW Contact:    Harriet Masson, RN Phone Number: 01/26/2022, 9:24 AM    Transition of Care Department [facility]) has reviewed patient and no TOC needs have been identified at this time. We will continue to monitor patient advancement through interdisciplinary progression rounds. If new patient transition needs arise, please place a TOC consult.

## 2022-01-26 NOTE — Progress Notes (Signed)
TRH night cross cover note:   I was notified by RN of the patient's request for a sleep aid. I subsequently placed order for prn melatonin for insomnia.     Rohith Fauth, DO Hospitalist  

## 2022-01-27 ENCOUNTER — Other Ambulatory Visit (HOSPITAL_COMMUNITY): Payer: Self-pay

## 2022-01-27 LAB — BASIC METABOLIC PANEL
Anion gap: 6 (ref 5–15)
BUN: 19 mg/dL (ref 8–23)
CO2: 27 mmol/L (ref 22–32)
Calcium: 9.1 mg/dL (ref 8.9–10.3)
Chloride: 97 mmol/L — ABNORMAL LOW (ref 98–111)
Creatinine, Ser: 0.96 mg/dL (ref 0.61–1.24)
GFR, Estimated: >60 mL/min (ref >60)
Glucose, Bld: 97 mg/dL (ref 70–99)
Potassium: 3.9 mmol/L (ref 3.5–5.1)
Sodium: 130 mmol/L — ABNORMAL LOW (ref 135–145)

## 2022-01-27 LAB — MAGNESIUM: Magnesium: 1.5 mg/dL — ABNORMAL LOW (ref 1.7–2.4)

## 2022-01-27 MED ORDER — FAMOTIDINE 20 MG PO TABS
20.0000 mg | ORAL_TABLET | Freq: Two times a day (BID) | ORAL | 0 refills | Status: DC
  Filled 2022-01-27: qty 60, 30d supply, fill #0

## 2022-01-27 MED ORDER — ALBUTEROL SULFATE HFA 108 (90 BASE) MCG/ACT IN AERS
1.0000 | INHALATION_SPRAY | Freq: Four times a day (QID) | RESPIRATORY_TRACT | 0 refills | Status: DC | PRN
  Filled 2022-01-27: qty 18, 14d supply, fill #0

## 2022-01-27 MED ORDER — FLUTICASONE-SALMETEROL 100-50 MCG/ACT IN AEPB
1.0000 | INHALATION_SPRAY | Freq: Two times a day (BID) | RESPIRATORY_TRACT | 0 refills | Status: DC
Start: 2022-01-27 — End: 2022-08-20
  Filled 2022-01-27: qty 60, 30d supply, fill #0

## 2022-01-27 MED ORDER — AZITHROMYCIN 500 MG PO TABS
500.0000 mg | ORAL_TABLET | Freq: Every day | ORAL | 0 refills | Status: AC
  Filled 2022-01-27: qty 3, 3d supply, fill #0

## 2022-01-27 MED ORDER — PREDNISONE 10 MG PO TABS
40.0000 mg | ORAL_TABLET | Freq: Every day | ORAL | 0 refills | Status: AC
  Filled 2022-01-27: qty 12, 3d supply, fill #0

## 2022-01-27 MED ORDER — ATORVASTATIN CALCIUM 20 MG PO TABS
20.0000 mg | ORAL_TABLET | Freq: Every day | ORAL | 0 refills | Status: DC
  Filled 2022-01-27: qty 30, 30d supply, fill #0

## 2022-01-27 MED ORDER — MAGNESIUM SULFATE 4 GM/100ML IV SOLN
4.0000 g | Freq: Once | INTRAVENOUS | Status: AC
  Administered 2022-01-27: 4 g via INTRAVENOUS
  Filled 2022-01-27: qty 100

## 2022-01-27 MED ORDER — IPRATROPIUM-ALBUTEROL 0.5-2.5 (3) MG/3ML IN SOLN: 3.0000 mL | Freq: Two times a day (BID) | RESPIRATORY_TRACT | Status: DC

## 2022-01-27 NOTE — TOC Transition Note (Signed)
Transition of Care [facility]) - CM/SW Discharge Note   Patient Details  Name: [name] MRN: [id number] Date of Birth: [birth year]  Transition of Care [facility]) CM/SW Contact:  Harriet Masson, RN Phone Number: 01/27/2022, 12:28 PM   Clinical Narrative:    Spoke to patient regarding transition needs. Patient agreeable to OP rehab and chose [address] location. Referral sent. Patient requesting cane since he already has a walker. Spoke to [place] with [employer] and the cane would be out of pocket therefore patient declined the cane. New PCP apt made and patient is aware. Patient notified that he can call medicaid transportation number on the back of his insurance card if he needs transportation assistance.  Bus pass on chart and bedside RN made aware.   Final next level of care: OP Rehab Barriers to Discharge: Barriers Resolved   Patient Goals and CMS Choice Patient states their goals for this hospitalization and ongoing recovery are:: return home      Discharge Placement                 home      Discharge Plan and Services    OP rehab            DME Arranged: [place] Mor DME Agency: [employer] Date DME Agency Contacted: 01/27/22 Time DME Agency Contacted: [phone] Representative spoke with at DME Agency: Lawernce Keas            Social Determinants of Health (SDOH) Interventions     Readmission Risk Interventions    01/27/2022   12:27 PM  Readmission Risk Prevention Plan  Transportation Screening Complete  PCP or Specialist Appt within 5-7 Days Not Complete  Not Complete comments new PCP made for 8/28  Home Care Screening Complete  Medication Review (RN CM) Complete

## 2022-01-27 NOTE — Discharge Summary (Signed)
Physician Discharge Summary  Jaime Cline EQA:[id number] DOB: Nov 11, 1960 DOA: 01/24/2022  PCP: Pcp, No  Admit date: 01/24/2022 Discharge date: 01/27/2022  Admitted From: Home Disposition:  Home  Recommendations for Outpatient Follow-up:  Follow up with PCP in 1-2 weeks Please obtain BMP/CBC in one week your next doctors visit.  Oral azithromycin and prednisone for 3 more days Bronchodilators prescribed Counseled to quit smoking. With benefit from outpatient cardiology referral, can be arranged by PCP.  Home Health: PT/OT Equipment/Devices: None Discharge Condition: Stable CODE STATUS: Full code Diet recommendation: Regular  Brief/Interim Summary: 61 y.o. male with history of COPD with ongoing tobacco abuse presents to the ER with complaints of worsening shortness of breath for the last 2 days.  He is admitted for COPD exacerbation.  Upon admission he was started on aggressive bronchodilators and azithromycin.  Over the course of 2 days he did significantly better in the hospital.  He was able to ambulate with physical therapy without requiring oxygen.  Today he is medically stable for discharge.  I have counseled him to quit smoking.     Assessment & Plan:  Principal Problem:   COPD exacerbation (HCC)     Acute COPD exacerbation Active tobacco use - Breathing is significantly improved, transition IV Solu-Medrol to p.o. prednisone.  Breath sounds are pretty much normal.  Scheduled and as needed bronchodilators.  Pulmicort.  I-S/flutter valve supportive care.  Empiric azithromycin, 3 more days.  Elevated troponin - Initial troponin 65, trended down to 23.  EKG is nonischemic, follow-up outpatient.  Can be referred by PCP  Renal insufficiency - Around baseline creatinine 1.0  History of alcohol use - Monitor for any withdrawal         Consultations: None  Subjective: Feels great no complaints Ambulated with physical therapy without any evidence of hypoxia  Discharge  Exam: Vitals:   01/27/22 0730 01/27/22 0733  BP:    Pulse:    Resp:    Temp:    SpO2: 96% 98%   Vitals:   01/27/22 0300 01/27/22 0400 01/27/22 0730 01/27/22 0733  BP:  99/74    Pulse: 93 72    Resp:  18    Temp:      TempSrc:      SpO2: 98% 98% 96% 98%  Weight:      Height:        General: Pt is alert, awake, not in acute distress, cachectic frail with bilateral temporal wasting Cardiovascular: RRR, S1/S2 +, no rubs, no gallops Respiratory: CTA bilaterally, no wheezing, no rhonchi Abdominal: Soft, NT, ND, bowel sounds + Extremities: no edema, no cyanosis  Discharge Instructions   Allergies as of 01/27/2022       Reactions   Other Other (See Comments)   BP med from [facility] about 3 years ago, caused some seizures, due to getting wrong BP med at that time.  Unknown name of med        Medication List     TAKE these medications    albuterol 108 (90 Base) MCG/ACT inhaler Commonly known as: VENTOLIN HFA Inhale 1-2 puffs into the lungs every 6 (six) hours as needed for wheezing.   atorvastatin 20 MG tablet Commonly known as: LIPITOR Take 1 tablet (20 mg total) by mouth daily.   azithromycin 500 MG tablet Commonly known as: ZITHROMAX Take 1 tablet (500 mg total) by mouth daily for 3 days. Start taking on: January 28, 2022   famotidine 20 MG tablet Commonly known as: PEPCID  Take 1 tablet (20 mg total) by mouth 2 (two) times daily.   fluticasone-salmeterol 100-50 MCG/ACT Aepb Commonly known as: ADVAIR Inhale 1 puff into the lungs 2 (two) times daily. What changed: medication strength   predniSONE 10 MG tablet Commonly known as: DELTASONE Take 4 tablets (40 mg total) by mouth daily with breakfast for 3 days.   sertraline 50 MG tablet Commonly known as: ZOLOFT Take 1 tablet (50 mg total) by mouth daily.               Durable Medical Equipment  (From admission, onward)           Start     Ordered   01/27/22 1015  For home use only DME Cane   Once        01/27/22 1014            Follow-up Information     Evlyn Kanner, MD Follow up.   Specialty: Internal Medicine Why: TIME : 3:15 PM DATE: AUGUST 28 , 2023 LOCATION: [facility]  INTERNAL MEDICINE, ENTRANCE [facility] FLOOR [address] [phone] Contact information: [address] [place] [address] [phone]                Allergies  Allergen Reactions   Other Other (See Comments)    BP med from [facility] about 3 years ago, caused some seizures, due to getting wrong BP med at that time.  Unknown name of med    You were cared for by a hospitalist during your hospital stay. If you have any questions about your discharge medications or the care you received while you were in the hospital after you are discharged, you can call the unit and asked to speak with the hospitalist on call if the hospitalist that took care of you is not available. Once you are discharged, your primary care physician will handle any further medical issues. Please note that no refills for any discharge medications will be authorized once you are discharged, as it is imperative that you return to your primary care physician (or establish a relationship with a primary care physician if you do not have one) for your aftercare needs so that they can reassess your need for medications and monitor your lab values.   Procedures/Studies: DG Ribs Unilateral W/Chest Left  Result Date: 01/24/2022 CLINICAL DATA:  Left-sided chest pain for 2 days, pain with inspiration and movement EXAM: LEFT RIBS AND CHEST - 3+ VIEW COMPARISON:  01/09/2022 FINDINGS: Frontal view of the chest as well as frontal and oblique views of the left thoracic cage are obtained. Cardiac silhouette is unremarkable. The lungs are hyperinflated with background emphysema. No acute airspace disease, effusion, or pneumothorax. There are no acute displaced rib fractures. IMPRESSION: 1. Emphysema.  No acute airspace disease. 2. No  displaced rib fracture. Electronically Signed   By: Sharlet Salina M.D.   On: 01/24/2022 16:22   DG Chest Port 1 View  Result Date: 01/09/2022 CLINICAL DATA:  Shortness of breath.  Asthma.  Cough. EXAM: PORTABLE CHEST 1 VIEW COMPARISON:  12/19/2021 FINDINGS: Heart size and pulmonary vascularity are normal. Diffuse emphysematous changes in the lungs with scattered fibrosis. No consolidation or edema. No pleural effusions. No pneumothorax. Calcification of the aorta. IMPRESSION: Severe emphysematous changes in the lungs.  No focal consolidation. Electronically Signed   By: Burman Nieves M.D.   On: 01/09/2022 23:58     The results of significant diagnostics from this hospitalization (including imaging,  microbiology, ancillary and laboratory) are listed below for reference.     Microbiology: Recent Results (from the past 240 hour(s))  SARS Coronavirus 2 by RT PCR (hospital order, performed in [facility] hospital lab) *cepheid single result test* Anterior Nasal Swab     Status: None   Collection Time: 01/24/22  9:06 PM   Specimen: Anterior Nasal Swab  Result Value Ref Range Status   SARS Coronavirus 2 by RT PCR NEGATIVE NEGATIVE Final    Comment: (NOTE) SARS-CoV-2 target nucleic acids are NOT DETECTED.  The SARS-CoV-2 RNA is generally detectable in upper and lower respiratory specimens during the acute phase of infection. The lowest concentration of SARS-CoV-2 viral copies this assay can detect is 250 copies / mL. A negative result does not preclude SARS-CoV-2 infection and should not be used as the sole basis for treatment or other patient management decisions.  A negative result may occur with improper specimen collection / handling, submission of specimen other than nasopharyngeal swab, presence of viral mutation(s) within the areas targeted by this assay, and inadequate number of viral copies (<250 copies / mL). A negative result must be combined with clinical observations, patient  history, and epidemiological information.  Fact Sheet for Patients:   RoadLapTop.co.za  Fact Sheet for Healthcare Providers: http://kim-miller.com/  This test is not yet approved or  cleared by the [place] FDA and has been authorized for detection and/or diagnosis of SARS-CoV-2 by FDA under an Emergency Use Authorization (EUA).  This EUA will remain in effect (meaning this test can be used) for the duration of the COVID-19 declaration under Section 564(b)(1) of the Act, 21 U.S.C. section 360bbb-3(b)(1), unless the authorization is terminated or revoked sooner.  Performed at [facility], [address] [place], [place] [address]      Labs: BNP (last 3 results) Recent Labs    08/27/21 0801 09/26/21 0522  BNP 102.9* 73.7   Basic Metabolic Panel: Recent Labs  Lab 01/24/22 1549 01/24/22 2124 01/24/22 2138 01/25/22 0115 01/25/22 0516 01/26/22 0343 01/27/22 0337  NA 134* 134* 133*  --  131* 131* 130*  K 4.4 4.6 4.5  --  4.5 3.9 3.9  CL 98 96*  --   --  93* 96* 97*  CO2 18* 18*  --   --  25 27 27   GLUCOSE 85 83  --   --  294* 167* 97  BUN 13 17  --   --  16 20 19   CREATININE 0.92 1.09  --  1.22 1.05 1.18 0.96  CALCIUM 9.8 9.8  --   --  9.0 9.2 9.1  MG  --   --   --   --   --  1.6* 1.5*   Liver Function Tests: No results for input(s): "AST", "ALT", "ALKPHOS", "BILITOT", "PROT", "ALBUMIN" in the last 168 hours. No results for input(s): "LIPASE", "AMYLASE" in the last 168 hours. No results for input(s): "AMMONIA" in the last 168 hours. CBC: Recent Labs  Lab 01/24/22 1549 01/24/22 2138 01/25/22 0115 01/25/22 0516  WBC 11.2*  --  7.5 4.4  HGB 14.3 16.3 13.4 12.5*  HCT 41.7 48.0 39.4 34.9*  MCV 95.9  --  95.4 92.8  PLT 147*  --  156 155   Cardiac Enzymes: No results for input(s): "CKTOTAL", "CKMB", "CKMBINDEX", "TROPONINI" in the last 168 hours. BNP: Invalid input(s): "POCBNP" CBG: No results for  input(s): "GLUCAP" in the last 168 hours. D-Dimer Recent Labs    01/25/22 [phone]  DDIMER 0.92*   Hgb A1c No results for input(s): "HGBA1C" in the last 72 hours. Lipid Profile No results for input(s): "CHOL", "HDL", "LDLCALC", "TRIG", "CHOLHDL", "LDLDIRECT" in the last 72 hours. Thyroid function studies Recent Labs    01/25/22 0115  TSH 0.399   Anemia work up No results for input(s): "VITAMINB12", "FOLATE", "FERRITIN", "TIBC", "IRON", "RETICCTPCT" in the last 72 hours. Urinalysis    Component Value Date/Time   COLORURINE YELLOW 09/26/2021 1702   APPEARANCEUR CLEAR 09/26/2021 1702   LABSPEC 1.011 09/26/2021 1702   PHURINE 6.0 09/26/2021 1702   GLUCOSEU NEGATIVE 09/26/2021 1702   HGBUR SMALL (A) 09/26/2021 1702   BILIRUBINUR NEGATIVE 09/26/2021 1702   KETONESUR NEGATIVE 09/26/2021 1702   PROTEINUR NEGATIVE 09/26/2021 1702   UROBILINOGEN 0.2 03/27/2014 0158   NITRITE NEGATIVE 09/26/2021 1702   LEUKOCYTESUR NEGATIVE 09/26/2021 1702   Sepsis Labs Recent Labs  Lab 01/24/22 1549 01/25/22 0115 01/25/22 0516  WBC 11.2* 7.5 4.4   Microbiology Recent Results (from the past 240 hour(s))  SARS Coronavirus 2 by RT PCR (hospital order, performed in [facility] hospital lab) *cepheid single result test* Anterior Nasal Swab     Status: None   Collection Time: 01/24/22  9:06 PM   Specimen: Anterior Nasal Swab  Result Value Ref Range Status   SARS Coronavirus 2 by RT PCR NEGATIVE NEGATIVE Final    Comment: (NOTE) SARS-CoV-2 target nucleic acids are NOT DETECTED.  The SARS-CoV-2 RNA is generally detectable in upper and lower respiratory specimens during the acute phase of infection. The lowest concentration of SARS-CoV-2 viral copies this assay can detect is 250 copies / mL. A negative result does not preclude SARS-CoV-2 infection and should not be used as the sole basis for treatment or other patient management decisions.  A negative result may occur with improper specimen  collection / handling, submission of specimen other than nasopharyngeal swab, presence of viral mutation(s) within the areas targeted by this assay, and inadequate number of viral copies (<250 copies / mL). A negative result must be combined with clinical observations, patient history, and epidemiological information.  Fact Sheet for Patients:   RoadLapTop.co.za  Fact Sheet for Healthcare Providers: http://kim-miller.com/  This test is not yet approved or  cleared by the [place] FDA and has been authorized for detection and/or diagnosis of SARS-CoV-2 by FDA under an Emergency Use Authorization (EUA).  This EUA will remain in effect (meaning this test can be used) for the duration of the COVID-19 declaration under Section 564(b)(1) of the Act, 21 U.S.C. section 360bbb-3(b)(1), unless the authorization is terminated or revoked sooner.  Performed at [facility], [address], [place], [place] [address]      Time coordinating discharge:  I have spent 35 minutes face to face with the patient and on the ward discussing the patients care, assessment, plan and disposition with other care givers. >50% of the time was devoted counseling the patient about the risks and benefits of treatment/Discharge disposition and coordinating care.   SIGNED:   Dimple Nanas, MD  [facility] 01/27/2022, 11:30 AM   If 7PM-7AM, please contact night-coverage

## 2022-01-27 NOTE — Evaluation (Signed)
Physical Therapy Evaluation Patient Details Name: Jaime Cline MRN: [id number] DOB: Feb 07, 1961 Today's Date: 01/27/2022  History of Present Illness  61 yo male presents to ED on 8/8 with COPD exacerbation. PMH includes COPD, pulmonary HTN, alcohol abuse, and HTN  Clinical Impression   Pt presents with LE weakness, impaired balance, decreased understanding of COPD strategies, and decreased activity tolerance vs baseline. Pt to benefit from acute PT to address deficits. Pt ambulated hallway distance with use of SPC and supervision for safety, occasionally needs cues for breathing technique and rest breaks. Pt maintained SPO2 >91% on RA throughout mobility, RN and MD aware, and this was formally documented in note previously. PT to progress mobility as tolerated, and will continue to follow acutely.         Recommendations for follow up therapy are one component of a multi-disciplinary discharge planning process, led by the attending physician.  Recommendations may be updated based on patient status, additional functional criteria and insurance authorization.  Follow Up Recommendations Home health PT      Assistance Recommended at Discharge Set up Supervision/Assistance  Patient can return home with the following  A little help with walking and/or transfers    Equipment Recommendations Cane  Recommendations for Other Services       Functional Status Assessment Patient has had a recent decline in their functional status and demonstrates the ability to make significant improvements in function in a reasonable and predictable amount of time.     Precautions / Restrictions Precautions Precautions: Fall (moderate) Restrictions Weight Bearing Restrictions: No      Mobility  Bed Mobility Overal bed mobility: Modified Independent                  Transfers Overall transfer level: Needs assistance Equipment used: Straight cane Transfers: Sit to/from Stand Sit to Stand:  Supervision           General transfer comment: for safety, slow to rise    Ambulation/Gait Ambulation/Gait assistance: Supervision Gait Distance (Feet): 120 Feet Assistive device: Straight cane Gait Pattern/deviations: Step-through pattern, Decreased stride length Gait velocity: decr     General Gait Details: good sequencing with use of cane, cues for upright posture. Standing rest break x1 to recover LE fatigue, SPO2 maintained 91% and greater on RA  Stairs            Wheelchair Mobility    Modified Rankin (Stroke Patients Only)       Balance Overall balance assessment: Needs assistance, History of Falls Sitting-balance support: No upper extremity supported       Standing balance support: Single extremity supported, During functional activity Standing balance-Leahy Scale: Fair                               Pertinent Vitals/Pain Pain Assessment Pain Assessment: No/denies pain    Home Living Family/patient expects to be discharged to:: Private residence Living Arrangements: Other (Comment) (sister in law) Available Help at Discharge: Family;Available PRN/intermittently Type of Home: Apartment Home Access: Stairs to enter Entrance Stairs-Rails: Doctor, general practice of Steps: 7   Home Layout: One level Home Equipment: Pharmacist, hospital (2 wheels)      Prior Function Prior Level of Function : Needs assist             Mobility Comments: pt reports using RW x4 months but is having problems maneuvering it ADLs Comments: Reports sister in law has been helps  him into the bathroom to shower and with housekeeping tasks and medications. Pt's sisterinlaw also cooks and cleans     Hand Dominance   Dominant Hand: Right    Extremity/Trunk Assessment   Upper Extremity Assessment Upper Extremity Assessment: Defer to OT evaluation    Lower Extremity Assessment Lower Extremity Assessment: Generalized weakness     Cervical / Trunk Assessment Cervical / Trunk Assessment: Normal  Communication   Communication: No difficulties  Cognition Arousal/Alertness: Awake/alert Behavior During Therapy: WFL for tasks assessed/performed Overall Cognitive Status: Within Functional Limits for tasks assessed                                          General Comments General comments (skin integrity, edema, etc.): PT discussed home use of pulse oximeter for monitoring SpO2, pursed lip breathing, and energy conservation techniques. Pt very receptive    Exercises     Assessment/Plan    PT Assessment Patient needs continued PT services  PT Problem List Decreased strength;Decreased mobility;Decreased safety awareness;Decreased knowledge of precautions;Decreased activity tolerance;Decreased balance;Cardiopulmonary status limiting activity       PT Treatment Interventions DME instruction;Therapeutic activities;Gait training;Therapeutic exercise;Patient/family education;Balance training;Stair training;Functional mobility training    PT Goals (Current goals can be found in the Care Plan section)  Acute Rehab PT Goals Patient Stated Goal: home PT Goal Formulation: With patient Time For Goal Achievement: 02/10/22 Potential to Achieve Goals: Good    Frequency Min 3X/week     Co-evaluation               AM-PAC PT "6 Clicks" Mobility  Outcome Measure Help needed turning from your back to your side while in a flat bed without using bedrails?: None Help needed moving from lying on your back to sitting on the side of a flat bed without using bedrails?: None Help needed moving to and from a bed to a chair (including a wheelchair)?: None Help needed standing up from a chair using your arms (e.g., wheelchair or bedside chair)?: None Help needed to walk in hospital room?: A Little Help needed climbing 3-5 steps with a railing? : A Little 6 Click Score: 22    End of Session   Activity  Tolerance: Patient tolerated treatment well Patient left: in bed;with call bell/phone within reach Nurse Communication: Mobility status PT Visit Diagnosis: Other abnormalities of gait and mobility (R26.89)    Time: 1000-1017 PT Time Calculation (min) (ACUTE ONLY): 17 min   Charges:   PT Evaluation $PT Eval Low Complexity: 1 Low         Alyzabeth Pontillo S, PT DPT Acute Rehabilitation Services Pager [phone]  Office [phone]   Chayce Robbins E Christain [place] 01/27/2022, 11:20 AM

## 2022-01-27 NOTE — Progress Notes (Signed)
SATURATION QUALIFICATIONS: (This note is used to comply with regulatory documentation for home oxygen)  Patient Saturations on Room Air at Rest = 98%  Patient Saturations on Room Air while Ambulating = 91%  Patient Saturations on -- Liters of oxygen while Ambulating = --%  Please briefly explain why patient needs home oxygen: does not qualify for home O2  Marye Round, PT DPT Acute Rehabilitation Services Pager [phone]  Office [phone]

## 2022-01-27 NOTE — Evaluation (Signed)
Occupational Therapy Evaluation and Discharge Patient Details Name: Jaime Cline MRN: [id number] DOB: 1960/08/18 Today's Date: 01/27/2022   History of Present Illness 61 yo male presents to ED on 8/8 with COPD exacerbation. PMH includes COPD, pulmonary HTN, alcohol abuse, and HTN   Clinical Impression   Pt walks with a RW and is assisted for showering and all IADLs by his sister in law. Pt is likely functioning very close to his baseline. Supervised for ambulation with RW, toileting and standing grooming. Pt with Sp02 of 95% on RA at rest and 91% with exertion. Pt educated verbally in energy conservation and energy conservation strategies. No further OT  needs.      Recommendations for follow up therapy are one component of a multi-disciplinary discharge planning process, led by the attending physician.  Recommendations may be updated based on patient status, additional functional criteria and insurance authorization.   Follow Up Recommendations  No OT follow up    Assistance Recommended at Discharge Intermittent Supervision/Assistance  Patient can return home with the following Two people to help with bathing/dressing/bathroom;Assistance with cooking/housework;Assist for transportation;Help with stairs or ramp for entrance    Functional Status Assessment  Patient has had a recent decline in their functional status and demonstrates the ability to make significant improvements in function in a reasonable and predictable amount of time.  Equipment Recommendations  None recommended by OT    Recommendations for Other Services       Precautions / Restrictions Precautions Precautions: Fall Restrictions Weight Bearing Restrictions: No      Mobility Bed Mobility Overal bed mobility: Modified Independent                  Transfers Overall transfer level: Needs assistance Equipment used: Rolling walker (2 wheels) Transfers: Sit to/from Stand Sit to Stand: Supervision            General transfer comment: for safety, slow to rise      Balance Overall balance assessment: Needs assistance, History of Falls   Sitting balance-Leahy Scale: Good     Standing balance support: No upper extremity supported, During functional activity Standing balance-Leahy Scale: Fair                             ADL either performed or assessed with clinical judgement   ADL Overall ADL's : At baseline                                             Vision Ability to See in Adequate Light: 0 Adequate Patient Visual Report: No change from baseline       Perception     Praxis      Pertinent Vitals/Pain Pain Assessment Pain Assessment: No/denies pain     Hand Dominance Right   Extremity/Trunk Assessment Upper Extremity Assessment Upper Extremity Assessment: Overall WFL for tasks assessed   Lower Extremity Assessment Lower Extremity Assessment: Defer to PT evaluation   Cervical / Trunk Assessment Cervical / Trunk Assessment: Normal   Communication Communication Communication: No difficulties   Cognition Arousal/Alertness: Awake/alert Behavior During Therapy: WFL for tasks assessed/performed Overall Cognitive Status: Within Functional Limits for tasks assessed  General Comments  educated in energy conservation and importance of smoking cessation    Exercises     Shoulder Instructions      Home Living Family/patient expects to be discharged to:: Private residence Living Arrangements: Other (Comment) (sister in law) Available Help at Discharge: Family;Available PRN/intermittently Type of Home: Apartment Home Access: Stairs to enter Entrance Stairs-Number of Steps: 7 Entrance Stairs-Rails: Right;Left Home Layout: One level     Bathroom Shower/Tub: Chief Strategy Officer: Standard     Home Equipment: Pharmacist, hospital (2 wheels)           Prior Functioning/Environment Prior Level of Function : Needs assist             Mobility Comments: pt reports using RW x4 months but is having problems maneuvering it in the home ADLs Comments: Reports sister in law has been helps him into the bathroom to shower and with housekeeping tasks and medications. Pt's sisterinlaw also cooks and cleans        OT Problem List:        OT Treatment/Interventions:      OT Goals(Current goals can be found in the care plan section)    OT Frequency:      Co-evaluation              AM-PAC OT "6 Clicks" Daily Activity     Outcome Measure Help from another person eating meals?: None Help from another person taking care of personal grooming?: A Little Help from another person toileting, which includes using toliet, bedpan, or urinal?: A Little Help from another person bathing (including washing, rinsing, drying)?: A Little Help from another person to put on and taking off regular upper body clothing?: None   6 Click Score: 17   End of Session Equipment Utilized During Treatment: Rolling walker (2 wheels)  Activity Tolerance: Patient tolerated treatment well Patient left: in bed;with call bell/phone within reach  OT Visit Diagnosis: Other abnormalities of gait and mobility (R26.89);Other (comment) (decreased activity tolerance)                Time: 1027-1040 OT Time Calculation (min): 13 min Charges:  OT General Charges $OT Visit: 1 Visit OT Evaluation $OT Eval Moderate Complexity: 1 Mod  Berna Spare, OTR/L Acute Rehabilitation Services Office: [phone]   Evern Bio 01/27/2022, 11:30 AM

## 2022-02-13 ENCOUNTER — Ambulatory Visit: Payer: Medicaid Other | Admitting: Student

## 2022-04-10 ENCOUNTER — Emergency Department (HOSPITAL_COMMUNITY): Payer: Medicaid Other

## 2022-04-10 ENCOUNTER — Other Ambulatory Visit: Payer: Self-pay

## 2022-04-10 ENCOUNTER — Encounter (HOSPITAL_COMMUNITY): Payer: Self-pay | Admitting: Internal Medicine

## 2022-04-10 ENCOUNTER — Inpatient Hospital Stay (HOSPITAL_COMMUNITY)
Admission: EM | Admit: 2022-04-10 | Discharge: 2022-04-19 | DRG: 190 | Disposition: A | Discharge status: Skilled Nursing Facility | Payer: Medicaid Other | Attending: Internal Medicine | Admitting: Internal Medicine

## 2022-04-10 DIAGNOSIS — E43 Unspecified severe protein-calorie malnutrition: Secondary | ICD-10-CM | POA: Diagnosis present

## 2022-04-10 DIAGNOSIS — Z7189 Other specified counseling: Secondary | ICD-10-CM

## 2022-04-10 DIAGNOSIS — Z66 Do not resuscitate: Secondary | ICD-10-CM | POA: Diagnosis present

## 2022-04-10 DIAGNOSIS — J441 Chronic obstructive pulmonary disease with (acute) exacerbation: Secondary | ICD-10-CM | POA: Diagnosis not present

## 2022-04-10 DIAGNOSIS — F419 Anxiety disorder, unspecified: Secondary | ICD-10-CM | POA: Diagnosis present

## 2022-04-10 DIAGNOSIS — J9601 Acute respiratory failure with hypoxia: Secondary | ICD-10-CM | POA: Diagnosis present

## 2022-04-10 DIAGNOSIS — Z681 Body mass index (BMI) 19 or less, adult: Secondary | ICD-10-CM

## 2022-04-10 DIAGNOSIS — R0600 Dyspnea, unspecified: Secondary | ICD-10-CM

## 2022-04-10 DIAGNOSIS — Z7952 Long term (current) use of systemic steroids: Secondary | ICD-10-CM

## 2022-04-10 DIAGNOSIS — Z79899 Other long term (current) drug therapy: Secondary | ICD-10-CM

## 2022-04-10 DIAGNOSIS — Z888 Allergy status to other drugs, medicaments and biological substances status: Secondary | ICD-10-CM

## 2022-04-10 DIAGNOSIS — Z20822 Contact with and (suspected) exposure to covid-19: Secondary | ICD-10-CM | POA: Diagnosis present

## 2022-04-10 DIAGNOSIS — F17200 Nicotine dependence, unspecified, uncomplicated: Secondary | ICD-10-CM | POA: Diagnosis present

## 2022-04-10 DIAGNOSIS — I10 Essential (primary) hypertension: Secondary | ICD-10-CM | POA: Diagnosis present

## 2022-04-10 DIAGNOSIS — F17210 Nicotine dependence, cigarettes, uncomplicated: Secondary | ICD-10-CM | POA: Diagnosis present

## 2022-04-10 DIAGNOSIS — Z515 Encounter for palliative care: Secondary | ICD-10-CM

## 2022-04-10 DIAGNOSIS — R64 Cachexia: Secondary | ICD-10-CM | POA: Diagnosis present

## 2022-04-10 LAB — CBC WITH DIFFERENTIAL/PLATELET
Abs Immature Granulocytes: 0.01 10*3/uL (ref 0.00–0.07)
Basophils Absolute: 0.1 10*3/uL (ref 0.0–0.1)
Basophils Relative: 1 %
Eosinophils Absolute: 0 10*3/uL (ref 0.0–0.5)
Eosinophils Relative: 0 %
HCT: 44.5 % (ref 39.0–52.0)
Hemoglobin: 13.6 g/dL (ref 13.0–17.0)
Immature Granulocytes: 0 %
Lymphocytes Relative: 22 %
Lymphs Abs: 1.2 10*3/uL (ref 0.7–4.0)
MCH: 32.8 pg (ref 26.0–34.0)
MCHC: 30.6 g/dL (ref 30.0–36.0)
MCV: 107.2 fL — ABNORMAL HIGH (ref 80.0–100.0)
Monocytes Absolute: 0.6 10*3/uL (ref 0.1–1.0)
Monocytes Relative: 11 %
Neutro Abs: 3.4 10*3/uL (ref 1.7–7.7)
Neutrophils Relative %: 66 %
Platelets: 212 10*3/uL (ref 150–400)
RBC: 4.15 MIL/uL — ABNORMAL LOW (ref 4.22–5.81)
RDW: 17.3 % — ABNORMAL HIGH (ref 11.5–15.5)
WBC: 5.1 10*3/uL (ref 4.0–10.5)
nRBC: 0 % (ref 0.0–0.2)

## 2022-04-10 LAB — BLOOD GAS, VENOUS
Acid-base deficit: 1.3 mmol/L (ref 0.0–2.0)
Bicarbonate: 28.4 mmol/L — ABNORMAL HIGH (ref 20.0–28.0)
O2 Saturation: 29.4 %
Patient temperature: 37
pCO2, Ven: 71 mmHg (ref 44–60)
pH, Ven: 7.21 — ABNORMAL LOW (ref 7.25–7.43)
pO2, Ven: <31 mmHg — CL (ref 32–45)

## 2022-04-10 LAB — COMPREHENSIVE METABOLIC PANEL
ALT: 23 U/L (ref 0–44)
AST: 38 U/L (ref 15–41)
Albumin: 4.5 g/dL (ref 3.5–5.0)
Alkaline Phosphatase: 64 U/L (ref 38–126)
Anion gap: 12 (ref 5–15)
BUN: 13 mg/dL (ref 8–23)
CO2: 24 mmol/L (ref 22–32)
Calcium: 9.2 mg/dL (ref 8.9–10.3)
Chloride: 99 mmol/L (ref 98–111)
Creatinine, Ser: 0.79 mg/dL (ref 0.61–1.24)
GFR, Estimated: >60 mL/min (ref >60)
Glucose, Bld: 121 mg/dL — ABNORMAL HIGH (ref 70–99)
Potassium: 3.8 mmol/L (ref 3.5–5.1)
Sodium: 135 mmol/L (ref 135–145)
Total Bilirubin: 0.7 mg/dL (ref 0.3–1.2)
Total Protein: 8 g/dL (ref 6.5–8.1)

## 2022-04-10 LAB — MRSA NEXT GEN BY PCR, NASAL: MRSA by PCR Next Gen: NOT DETECTED

## 2022-04-10 LAB — RESP PANEL BY RT-PCR (FLU A&B, COVID) ARPGX2
Influenza A by PCR: NEGATIVE
Influenza B by PCR: NEGATIVE
SARS Coronavirus 2 by RT PCR: NEGATIVE

## 2022-04-10 LAB — BRAIN NATRIURETIC PEPTIDE: B Natriuretic Peptide: 930.5 pg/mL — ABNORMAL HIGH (ref 0.0–100.0)

## 2022-04-10 MED ORDER — PREDNISONE 20 MG PO TABS
40.0000 mg | ORAL_TABLET | Freq: Every day | ORAL | Status: DC
  Administered 2022-04-12: 40 mg via ORAL
  Filled 2022-04-10: qty 2

## 2022-04-10 MED ORDER — DOXYCYCLINE HYCLATE 100 MG PO TABS
100.0000 mg | ORAL_TABLET | Freq: Two times a day (BID) | ORAL | Status: AC
  Administered 2022-04-10 – 2022-04-15 (×10): 100 mg via ORAL
  Filled 2022-04-10 (×10): qty 1

## 2022-04-10 MED ORDER — ENOXAPARIN SODIUM 30 MG/0.3ML IJ SOSY
30.0000 mg | PREFILLED_SYRINGE | INTRAMUSCULAR | Status: DC
  Administered 2022-04-11 – 2022-04-19 (×9): 30 mg via SUBCUTANEOUS
  Filled 2022-04-10 (×9): qty 0.3

## 2022-04-10 MED ORDER — ONDANSETRON HCL 4 MG/2ML IJ SOLN: 4.0000 mg | Freq: Four times a day (QID) | INTRAMUSCULAR | Status: DC | PRN

## 2022-04-10 MED ORDER — ATORVASTATIN CALCIUM 20 MG PO TABS
20.0000 mg | ORAL_TABLET | Freq: Every day | ORAL | Status: DC
  Administered 2022-04-11 – 2022-04-19 (×9): 20 mg via ORAL
  Filled 2022-04-10 (×9): qty 1

## 2022-04-10 MED ORDER — MELATONIN 3 MG PO TABS
3.0000 mg | ORAL_TABLET | Freq: Once | ORAL | Status: AC
  Administered 2022-04-10: 3 mg via ORAL
  Filled 2022-04-10: qty 1

## 2022-04-10 MED ORDER — ACETAMINOPHEN 650 MG RE SUPP: 650.0000 mg | Freq: Four times a day (QID) | RECTAL | Status: DC | PRN

## 2022-04-10 MED ORDER — CHLORHEXIDINE GLUCONATE CLOTH 2 % EX PADS
6.0000 | MEDICATED_PAD | Freq: Every day | CUTANEOUS | Status: DC
  Administered 2022-04-10 – 2022-04-14 (×4): 6 via TOPICAL

## 2022-04-10 MED ORDER — NICOTINE 14 MG/24HR TD PT24
14.0000 mg | MEDICATED_PATCH | Freq: Every day | TRANSDERMAL | Status: DC
  Administered 2022-04-10 – 2022-04-19 (×9): 14 mg via TRANSDERMAL
  Filled 2022-04-10 (×9): qty 1

## 2022-04-10 MED ORDER — IPRATROPIUM-ALBUTEROL 0.5-2.5 (3) MG/3ML IN SOLN
RESPIRATORY_TRACT | Status: AC
  Filled 2022-04-10: qty 6

## 2022-04-10 MED ORDER — ACETAMINOPHEN 325 MG PO TABS
650.0000 mg | ORAL_TABLET | Freq: Four times a day (QID) | ORAL | Status: DC | PRN
  Administered 2022-04-11 – 2022-04-18 (×2): 650 mg via ORAL
  Filled 2022-04-10 (×3): qty 2

## 2022-04-10 MED ORDER — MAGNESIUM SULFATE 2 GM/50ML IV SOLN
2.0000 g | Freq: Once | INTRAVENOUS | Status: AC
Start: 2022-04-10 — End: 2022-04-10
  Administered 2022-04-10: 2 g via INTRAVENOUS

## 2022-04-10 MED ORDER — ALBUTEROL SULFATE (2.5 MG/3ML) 0.083% IN NEBU
2.5000 mg | INHALATION_SOLUTION | RESPIRATORY_TRACT | Status: DC | PRN
  Administered 2022-04-11 – 2022-04-18 (×6): 2.5 mg via RESPIRATORY_TRACT
  Filled 2022-04-10 (×6): qty 3

## 2022-04-10 MED ORDER — IPRATROPIUM-ALBUTEROL 0.5-2.5 (3) MG/3ML IN SOLN
3.0000 mL | Freq: Once | RESPIRATORY_TRACT | Status: AC
  Administered 2022-04-10: 3 mL via RESPIRATORY_TRACT
  Filled 2022-04-10: qty 3

## 2022-04-10 MED ORDER — SERTRALINE HCL 50 MG PO TABS
50.0000 mg | ORAL_TABLET | Freq: Every day | ORAL | Status: DC
  Administered 2022-04-11 – 2022-04-19 (×9): 50 mg via ORAL
  Filled 2022-04-10 (×9): qty 1

## 2022-04-10 MED ORDER — IPRATROPIUM-ALBUTEROL 0.5-2.5 (3) MG/3ML IN SOLN
3.0000 mL | Freq: Four times a day (QID) | RESPIRATORY_TRACT | Status: DC
  Administered 2022-04-11 (×4): 3 mL via RESPIRATORY_TRACT
  Filled 2022-04-10 (×4): qty 3

## 2022-04-10 MED ORDER — METHYLPREDNISOLONE SODIUM SUCC 125 MG IJ SOLR
125.0000 mg | Freq: Once | INTRAMUSCULAR | Status: AC
  Administered 2022-04-10: 125 mg via INTRAVENOUS
  Filled 2022-04-10: qty 2

## 2022-04-10 MED ORDER — MOMETASONE FURO-FORMOTEROL FUM 100-5 MCG/ACT IN AERO
2.0000 | INHALATION_SPRAY | Freq: Two times a day (BID) | RESPIRATORY_TRACT | Status: DC
  Administered 2022-04-11 – 2022-04-19 (×17): 2 via RESPIRATORY_TRACT
  Filled 2022-04-10: qty 8.8

## 2022-04-10 MED ORDER — METHYLPREDNISOLONE SODIUM SUCC 40 MG IJ SOLR
40.0000 mg | Freq: Two times a day (BID) | INTRAMUSCULAR | Status: AC
  Administered 2022-04-10 – 2022-04-11 (×2): 40 mg via INTRAVENOUS
  Filled 2022-04-10 (×2): qty 1

## 2022-04-10 MED ORDER — ORAL CARE MOUTH RINSE
15.0000 mL | OROMUCOSAL | Status: DC
  Administered 2022-04-10 – 2022-04-11 (×4): 15 mL via OROMUCOSAL

## 2022-04-10 MED ORDER — FAMOTIDINE 20 MG PO TABS
20.0000 mg | ORAL_TABLET | Freq: Two times a day (BID) | ORAL | Status: DC
  Administered 2022-04-11 – 2022-04-19 (×16): 20 mg via ORAL
  Filled 2022-04-10 (×17): qty 1

## 2022-04-10 MED ORDER — ALBUTEROL SULFATE (2.5 MG/3ML) 0.083% IN NEBU
10.0000 mg/h | INHALATION_SOLUTION | Freq: Once | RESPIRATORY_TRACT | Status: AC
  Administered 2022-04-10 (×2): 10 mg/h via RESPIRATORY_TRACT
  Filled 2022-04-10: qty 3

## 2022-04-10 MED ORDER — ALBUTEROL SULFATE (2.5 MG/3ML) 0.083% IN NEBU: 10.0000 mg/h | INHALATION_SOLUTION | RESPIRATORY_TRACT | Status: AC

## 2022-04-10 MED ORDER — IPRATROPIUM-ALBUTEROL 0.5-2.5 (3) MG/3ML IN SOLN
3.0000 mL | RESPIRATORY_TRACT | Status: DC | PRN
  Administered 2022-04-10: 3 mL via RESPIRATORY_TRACT

## 2022-04-10 MED ORDER — GUAIFENESIN 100 MG/5ML PO LIQD
10.0000 mL | Freq: Four times a day (QID) | ORAL | Status: DC | PRN
  Administered 2022-04-10 – 2022-04-18 (×13): 10 mL via ORAL
  Filled 2022-04-10 (×13): qty 10

## 2022-04-10 MED ORDER — ONDANSETRON HCL 4 MG PO TABS: 4.0000 mg | ORAL_TABLET | Freq: Four times a day (QID) | ORAL | Status: DC | PRN

## 2022-04-10 MED ORDER — ORAL CARE MOUTH RINSE: 15.0000 mL | OROMUCOSAL | Status: DC | PRN

## 2022-04-10 MED ORDER — MAGNESIUM SULFATE 2 GM/50ML IV SOLN
2.0000 g | Freq: Once | INTRAVENOUS | Status: DC
  Filled 2022-04-10: qty 50

## 2022-04-10 NOTE — H&P (Signed)
History and Physical    SUTTER AHLGREN ZHG:[id number] DOB: [birth year] DOA: 04/10/2022  PCP: Pcp, No  Patient coming from: Home  I have personally briefly reviewed patient's old medical records available.   Chief Complaint: Shortness of breath  HPI: Jaime Cline is a 61 y.o. male with medical history significant of COPD with ongoing tobacco abuse, recurrent hospitalization presented to the ER with worsening shortness of breath for at least 1 week.  He tried different medications at home without help.  Dry cough.  No sick contacts.  Denies any fever or chills.  Symptoms worse since today morning so came to ER. Denies any nausea vomiting.  Denies any headache or dizziness.  Denies any lightheadedness.  ED Course: Initially in respiratory distress in the emergency room and started on BiPAP.  ABG with pH 7.2, PCO2 71 and PO2 less than 31.  Tripoding, improved with BiPAP administration.  Chest x-ray with hyperinflated lungs.  COVID-19 and influenza negative.  Received steroids, multiple doses of nebulizer in the ER and admission requested due to significant symptoms.  Review of Systems: all systems are reviewed and pertinent positive as per HPI otherwise rest are negative.    Past Medical History:  Diagnosis Date   Alcohol abuse    Asthma    Bronchitis    Chronic back pain    Chronic knee pain    Hypertension     History reviewed. No pertinent surgical history.  Social history   reports that he has been smoking cigarettes. He has been smoking an average of 1 pack per day. He does not have any smokeless tobacco history on file. He reports current alcohol use of about 28.0 standard drinks of alcohol per week. He reports that he does not use drugs.  Allergies  Allergen Reactions   Other Other (See Comments)    BP med from [facility] about 3 years ago, caused some seizures, due to getting wrong BP med at that time.  Unknown name of med    Family History  Problem Relation Age of Onset    Cancer Mother    Cancer Sister      Prior to Admission medications   Medication Sig Start Date End Date Taking? Authorizing Provider  albuterol (VENTOLIN HFA) 108 (90 Base) MCG/ACT inhaler Inhale 1-2 puffs into the lungs every 6 (six) hours as needed for wheezing. 01/27/22   Damita Lack, MD  atorvastatin (LIPITOR) 20 MG tablet Take 1 tablet (20 mg total) by mouth daily. 01/27/22   Amin, Jeanella Flattery, MD  famotidine (PEPCID) 20 MG tablet Take 1 tablet (20 mg total) by mouth 2 (two) times daily. 01/27/22   Amin, Jeanella Flattery, MD  fluticasone-salmeterol (ADVAIR) 100-50 MCG/ACT AEPB Inhale 1 puff into the lungs 2 (two) times daily. 01/27/22   Amin, Jeanella Flattery, MD  sertraline (ZOLOFT) 50 MG tablet Take 1 tablet (50 mg total) by mouth daily. 01/10/22   Larene Pickett, PA-C  albuterol (PROVENTIL,VENTOLIN) 90 MCG/ACT inhaler Inhale 2 puffs into the lungs every 6 (six) hours as needed. Shortness of breath and wheezing   03/22/12  [provider]    Physical Exam: Vitals:   04/10/22 1600 04/10/22 1630 04/10/22 1700 04/10/22 1730  BP:  (!) 117/95 109/70 103/80  Pulse: (!) 146 (!) 138 (!) 132 (!) 122  Resp: (!) 28 17 (!) 25 18  Temp:      TempSrc:      SpO2: 100% 100% 100% 100%    Constitutional: Currently  comfortable on BiPAP.  Able to talk through the BiPAP mask. Patient is cachectic. Vitals:   04/10/22 1600 04/10/22 1630 04/10/22 1700 04/10/22 1730  BP:  (!) 117/95 109/70 103/80  Pulse: (!) 146 (!) 138 (!) 132 (!) 122  Resp: (!) 28 17 (!) 25 18  Temp:      TempSrc:      SpO2: 100% 100% 100% 100%   Eyes: PERRL, lids and conjunctivae normal ENMT: Mucous membranes are moist. Posterior pharynx clear of any exudate or lesions.Normal dentition.  Neck: normal, supple, no masses, no thyromegaly Respiratory: Currently on BiPAP.  Looks comfortable.  Poor air entry bilateral lungs with no added sounds. Cardiovascular: Regular rate and rhythm, no murmurs / rubs / gallops. No  extremity edema. 2+ pedal pulses. No carotid bruits.  Abdomen: no tenderness, no masses palpated. No hepatosplenomegaly. Bowel sounds positive.  Musculoskeletal: no clubbing / cyanosis. No joint deformity upper and lower extremities. Good ROM, no contractures. Normal muscle tone.  Skin: no rashes, lesions, ulcers. No induration Neurologic: CN 2-12 grossly intact. Sensation intact, DTR normal. Strength 5/5 in all 4.  Psychiatric: Normal judgment and insight. Alert and oriented x 3. Normal mood.     Labs on Admission: I have personally reviewed following labs and imaging studies  CBC: Recent Labs  Lab 04/10/22 1546  WBC 5.1  NEUTROABS 3.4  HGB 13.6  HCT 44.5  MCV 107.2*  PLT [id number]   Basic Metabolic Panel: Recent Labs  Lab 04/10/22 1546  NA 135  K 3.8  CL 99  CO2 24  GLUCOSE 121*  BUN 13  CREATININE 0.79  CALCIUM 9.2   GFR: CrCl cannot be calculated (Unknown ideal weight.). Liver Function Tests: Recent Labs  Lab 04/10/22 1546  AST 38  ALT 23  ALKPHOS 64  BILITOT 0.7  PROT 8.0  ALBUMIN 4.5   No results for input(s): "LIPASE", "AMYLASE" in the last 168 hours. No results for input(s): "AMMONIA" in the last 168 hours. Coagulation Profile: No results for input(s): "INR", "PROTIME" in the last 168 hours. Cardiac Enzymes: No results for input(s): "CKTOTAL", "CKMB", "CKMBINDEX", "TROPONINI" in the last 168 hours. BNP (last 3 results) No results for input(s): "PROBNP" in the last 8760 hours. HbA1C: No results for input(s): "HGBA1C" in the last 72 hours. CBG: No results for input(s): "GLUCAP" in the last 168 hours. Lipid Profile: No results for input(s): "CHOL", "HDL", "LDLCALC", "TRIG", "CHOLHDL", "LDLDIRECT" in the last 72 hours. Thyroid Function Tests: No results for input(s): "TSH", "T4TOTAL", "FREET4", "T3FREE", "THYROIDAB" in the last 72 hours. Anemia Panel: No results for input(s): "VITAMINB12", "FOLATE", "FERRITIN", "TIBC", "IRON", "RETICCTPCT" in the last 72  hours. Urine analysis:    Component Value Date/Time   COLORURINE YELLOW 09/26/2021 1702   APPEARANCEUR CLEAR 09/26/2021 1702   LABSPEC 1.011 09/26/2021 1702   PHURINE 6.0 09/26/2021 1702   GLUCOSEU NEGATIVE 09/26/2021 1702   HGBUR SMALL (A) 09/26/2021 1702   BILIRUBINUR NEGATIVE 09/26/2021 1702   KETONESUR NEGATIVE 09/26/2021 1702   PROTEINUR NEGATIVE 09/26/2021 1702   UROBILINOGEN 0.2 03/27/2014 0158   NITRITE NEGATIVE 09/26/2021 1702   LEUKOCYTESUR NEGATIVE 09/26/2021 1702    Radiological Exams on Admission: No results found.  EKG: Independently reviewed.  Low voltage EKG.  Sinus tachycardia.  Assessment/Plan Principal Problem:   COPD with acute exacerbation (HCC) Active Problems:   Smoker   Protein-calorie malnutrition, severe     1.  COPD with acute exacerbation: Acute hypoxemic respiratory failure and respiratory distress. On presentation patient with  severe respiratory distress, tripoding, PaO2 less than 30. Agree with admission to monitored unit because of severity of symptoms. Aggressive bronchodilator therapy, IV steroids, inhalational steroids, scheduled and as needed bronchodilators, deep breathing exercises, incentive spirometry, chest physiotherapy and respiratory therapy consult. Antibiotics due to severity of symptoms.  Will treat with doxycycline for 5 days. Supplemental oxygen to keep saturations more than 92%. BiPAP as needed, will leave on BiPAP tonight. Palliative care consultation.  May benefit with home hospice program due to advanced COPD.  2.  Smoker: Counseled to quit.  Nicotine patch.  3.  Cachexia, protein calorie malnutrition severe: Nutrition consult.   DVT prophylaxis: Lovenox subcu Code Status: Full code Family Communication: None at the bedside Disposition Plan: Home when stable Consults called: None Admission status: Stepdown for BiPAP support, observation.  Will be evaluated every day for admission needed.   Barb Merino  MD [facility] Pager [phone]

## 2022-04-10 NOTE — ED Triage Notes (Signed)
Per EMS- Patient c/o increased SOB and worsening. Diminished lung soounds all lobes. Patient states that he had 3 nebs prior to calling EMS with no relief. EMS brought patient in with a DuoNeb going. Patient is tripoding and working to breathe.  Patient received Solumedrol 125 mg IV prior to EMS arrival tot he ED.

## 2022-04-10 NOTE — ED Provider Notes (Signed)
[facility] DEPT Provider Note   CSN: [id number] Arrival date & time: 04/10/22  1425     History  CC: Shortness of breath   Jaime Cline is a 61 y.o. male presented emerged department shortness of breath.  Patient ports his history of COPD, does not wear oxygen at home.  Has been coughing for several days.  He does continue to smoke.  He also reports a fever at home.  He feels he is very short of breath today.  He reports he has been intubated in the past  HPI     Home Medications Prior to Admission medications   Medication Sig Start Date End Date Taking? Authorizing Provider  albuterol (VENTOLIN HFA) 108 (90 Base) MCG/ACT inhaler Inhale 1-2 puffs into the lungs every 6 (six) hours as needed for wheezing. 01/27/22   Damita Lack, MD  atorvastatin (LIPITOR) 20 MG tablet Take 1 tablet (20 mg total) by mouth daily. 01/27/22   Amin, Jeanella Flattery, MD  famotidine (PEPCID) 20 MG tablet Take 1 tablet (20 mg total) by mouth 2 (two) times daily. 01/27/22   Amin, Jeanella Flattery, MD  fluticasone-salmeterol (ADVAIR) 100-50 MCG/ACT AEPB Inhale 1 puff into the lungs 2 (two) times daily. 01/27/22   Amin, Jeanella Flattery, MD  sertraline (ZOLOFT) 50 MG tablet Take 1 tablet (50 mg total) by mouth daily. 01/10/22   Larene Pickett, PA-C  albuterol (PROVENTIL,VENTOLIN) 90 MCG/ACT inhaler Inhale 2 puffs into the lungs every 6 (six) hours as needed. Shortness of breath and wheezing   03/22/12  [provider]      Allergies    Other    Review of Systems   Review of Systems  Physical Exam Updated Vital Signs BP 103/80   Pulse (!) 122   Temp 97.8 F (36.6 C) (Oral)   Resp 18   SpO2 100%  Physical Exam Constitutional:      General: He is not in acute distress.    Comments: Thin, cachectic  HENT:     Head: Normocephalic and atraumatic.  Eyes:     Conjunctiva/sclera: Conjunctivae normal.     Pupils: Pupils are equal, round, and reactive to light.   Cardiovascular:     Rate and Rhythm: Normal rate and regular rhythm.  Pulmonary:     Effort: Pulmonary effort is normal. No respiratory distress.     Comments: Expiratory wheezing, speaking in short sentences, muscle retraction Abdominal:     General: There is no distension.     Tenderness: There is no abdominal tenderness.  Skin:    General: Skin is warm and dry.  Neurological:     General: No focal deficit present.     Mental Status: He is alert. Mental status is at baseline.  Psychiatric:        Mood and Affect: Mood normal.        Behavior: Behavior normal.     ED Results / Procedures / Treatments   Labs (all labs ordered are listed, but only abnormal results are displayed) Labs Reviewed  CBC WITH DIFFERENTIAL/PLATELET - Abnormal; Notable for the following components:      Result Value   RBC 4.15 (*)    MCV 107.2 (*)    RDW 17.3 (*)    All other components within normal limits  COMPREHENSIVE METABOLIC PANEL - Abnormal; Notable for the following components:   Glucose, Bld 121 (*)    All other components within normal limits  BLOOD GAS, VENOUS -  Abnormal; Notable for the following components:   pH, Ven 7.21 (*)    pCO2, Ven 71 (*)    pO2, Ven <31 (*)    Bicarbonate 28.4 (*)    All other components within normal limits  RESP PANEL BY RT-PCR (FLU A&B, COVID) ARPGX2  BRAIN NATRIURETIC PEPTIDE    EKG EKG Interpretation  Date/Time:  Monday April 10 2022 16:34:11 EDT Ventricular Rate:  134 PR Interval:  123 QRS Duration: 84 QT Interval:  317 QTC Calculation: 474 R Axis:   93 Text Interpretation: Sinus tachycardia Consider right atrial enlargement Abnormal lateral Q waves Confirmed by Alvester Chou [phone]) on 04/10/2022 5:33:41 PM  Radiology No results found.  Procedures .Critical Care  Performed by: Terald Sleeper, MD Authorized by: Terald Sleeper, MD   Critical care provider statement:    Critical care time (minutes):  45   Critical care time  was exclusive of:  Separately billable procedures and treating other patients   Critical care was necessary to treat or prevent imminent or life-threatening deterioration of the following conditions:  Respiratory failure   Critical care was time spent personally by me on the following activities:  Ordering and performing treatments and interventions, ordering and review of laboratory studies, ordering and review of radiographic studies, pulse oximetry, review of old charts, examination of patient and evaluation of patient's response to treatment   Care discussed with: admitting provider   Comments:     Bipap for COPD exacerbation     Medications Ordered in ED Medications  albuterol (PROVENTIL) (2.5 MG/3ML) 0.083% nebulizer solution (10 mg/hr Nebulization Not Given 04/10/22 1548)  ipratropium-albuterol (DUONEB) 0.5-2.5 (3) MG/3ML nebulizer solution 3 mL (3 mLs Nebulization Given 04/10/22 1513)  methylPREDNISolone sodium succinate (SOLU-MEDROL) 125 mg/2 mL injection 125 mg (125 mg Intravenous Given 04/10/22 1514)  albuterol (PROVENTIL) (2.5 MG/3ML) 0.083% nebulizer solution (10 mg/hr Nebulization Given 04/10/22 1532)  magnesium sulfate IVPB 2 g 50 mL (0 g Intravenous Stopped 04/10/22 1725)    ED Course/ Medical Decision Making/ A&P Clinical Course as of 04/10/22 1806  Mon Apr 10, 2022  1559 RN paging RT for bipap [MT]  1650 Patient is now breathing much more comfortably on the BiPAP on reassessment [MT]  1735 COVID and flu are negative.  Patient does have some respiratory acidosis, which I suspect will improve with the BiPAP, and he is breathing much more comfortably now.  Plan to admit.  I would leave him on BiPAP for now, given his frailty and his brittle COPD [MT]    Clinical Course User Index [MT] Stela Iwasaki, Kermit Balo, MD                           Medical Decision Making Amount and/or Complexity of Data Reviewed Labs: ordered.  Risk Prescription drug management. Decision regarding  hospitalization.   This patient presents to the ED with concern for shortness of breath. This involves an extensive number of treatment options, and is a complaint that carries with it a high risk of complications and morbidity.  The differential diagnosis includes COPD exacerbation most likely, and/or pneumonia, versus pneumothorax, versus viral infection Uri vs pleural effusion vs other  Co-morbidities that complicate the patient evaluation: History of smoking & COPD at high risk of pulmonary complication  External records from outside source obtained and reviewed including hospitalization course and discharge summary from August 2023 for COPD exacerbation.  Patient was treated for approximately 3 days in the  hospital  I ordered and personally interpreted labs.  The pertinent results include: Respiratory acidosis  I ordered imaging studies including x-ray of the chest I independently visualized and interpreted imaging which showed no focal infiltrate I agree with the radiologist interpretation  The patient was maintained on a cardiac monitor.  I personally viewed and interpreted the cardiac monitored which showed an underlying rhythm of: Sinus tachycardia  Per my interpretation the patient's ECG shows no acute ischemic findings  I ordered medication including IV steroids, DuoNebs, BiPAP, magnesium for COPD exacerbation  I have reviewed the patients home medicines and have made adjustments as needed  Test Considered: Lower suspicion for acute PE in this clinical setting   After the interventions noted above, I reevaluated the patient and found that they have: improved  Social Determinants of Health:Patient counseled about smoking cessation  Dispostion:  After consideration of the diagnostic results and the patients response to treatment, I feel that the patent would benefit from medical admission         Final Clinical Impression(s) / ED Diagnoses Final diagnoses:  COPD  exacerbation [facility])    Rx / DC Orders ED Discharge Orders     None         Terald Sleeper, MD 04/10/22 1806

## 2022-04-10 NOTE — ED Provider Triage Note (Signed)
Emergency Medicine Provider Triage Evaluation Note  Jaime Cline , a 61 y.o. male  was evaluated in triage.  Pt complains of shortness of breath.  H/o COPD.  Shortness of breath started yesterday.  Patient stated he started coughing up copious amounts of "phlegm".  It was yellowish in color.  Also endorses a fever.  States that he checked it at home and it was 104.  Symptoms have progressively worsened since yesterday.  Review of Systems  Positive: See above Negative: See above  Physical Exam  BP (!) 149/121 (BP Location: Left Arm)   Pulse (!) 128   Resp (!) 30   SpO2 (!) 84% Comment: Patieant placed on O2 2L/min via Wirt and sats increased to 94%. Temp: 97.8 F Gen:   Awake, distressed, tripoding Resp:  Normal effort  MSK:   Moves extremities without difficulty  Other:    Medical Decision Making  Medically screening exam initiated at 3:13 PM.  Appropriate orders placed.  Jaime Cline was informed that the remainder of the evaluation will be completed by another provider, this initial triage assessment does not replace that evaluation, and the importance of remaining in the ED until their evaluation is complete.  Work-up initiated   Harriet Pho, PA-C 04/10/22 1525

## 2022-04-10 NOTE — ED Notes (Signed)
ED TO INPATIENT HANDOFF REPORT  ED Nurse Name and Phone #: Julian Reil [phone]  S Name/Age/Gender Jaime Cline 61 y.o. male Room/Bed: WA17/WA17  Code Status   Code Status: Prior  Home/SNF/Other Home Patient oriented to: self, place, time, and situation Is this baseline? Yes   Triage Complete: Triage complete  Chief Complaint COPD with acute exacerbation (HCC) [J44.1]  Triage Note Per EMS- Patient c/o increased SOB and worsening. Diminished lung soounds all lobes. Patient states that he had 3 nebs prior to calling EMS with no relief. EMS brought patient in with a DuoNeb going. Patient is tripoding and working to breathe.  Patient received Solumedrol 125 mg IV prior to EMS arrival tot he ED.   Allergies Allergies  Allergen Reactions   Other Other (See Comments)    BP med from [facility] about 3 years ago, caused some seizures, due to getting wrong BP med at that time.  Unknown name of med    Level of Care/Admitting Diagnosis ED Disposition     ED Disposition  Admit   Condition  --   Comment  Hospital Area: [facility] [[id number]]  Level of Care: Stepdown [14]  Admit to SDU based on following criteria: Respiratory Distress:  Frequent assessment and/or intervention to maintain adequate ventilation/respiration, pulmonary toilet, and respiratory treatment.  May place patient in observation at [facility] or [facility] if equivalent level of care is available:: Yes  Covid Evaluation: Asymptomatic - no recent exposure (last 10 days) testing not required  Diagnosis: COPD with acute exacerbation [facility]) [[id number]]  Admitting Physician: Dorcas Carrow [[id number]]  Attending Physician: Dorcas Carrow [[id number]]          B Medical/Surgery History Past Medical History:  Diagnosis Date   Alcohol abuse    Asthma    Bronchitis    Chronic back pain    Chronic knee pain    Hypertension    History reviewed. No pertinent surgical history.   A IV  Location/Drains/Wounds Patient Lines/Drains/Airways Status     Active Line/Drains/Airways     Name Placement date Placement time Site Days   Peripheral IV 04/10/22 20 G Anterior;Left;Proximal Forearm 04/10/22  1442  Forearm  less than 1            Intake/Output Last 24 hours  Intake/Output Summary (Last 24 hours) at 04/10/2022 2100 Last data filed at 04/10/2022 1725 Gross per 24 hour  Intake 49.68 ml  Output --  Net 49.68 ml    Labs/Imaging Results for orders placed or performed during the hospital encounter of 04/10/22 (from the past 48 hour(s))  CBC with Differential     Status: Abnormal   Collection Time: 04/10/22  3:46 PM  Result Value Ref Range   WBC 5.1 4.0 - 10.5 K/uL   RBC 4.15 (L) 4.22 - 5.81 MIL/uL   Hemoglobin 13.6 13.0 - 17.0 g/dL   HCT 73.5 32.9 - 92.4 %   MCV 107.2 (H) 80.0 - 100.0 fL   MCH 32.8 26.0 - 34.0 pg   MCHC 30.6 30.0 - 36.0 g/dL   RDW 26.8 (H) 34.1 - 96.2 %   Platelets 212 150 - 400 K/uL   nRBC 0.0 0.0 - 0.2 %   Neutrophils Relative % 66 %   Neutro Abs 3.4 1.7 - 7.7 K/uL   Lymphocytes Relative 22 %   Lymphs Abs 1.2 0.7 - 4.0 K/uL   Monocytes Relative 11 %   Monocytes Absolute 0.6 0.1 - 1.0 K/uL  Eosinophils Relative 0 %   Eosinophils Absolute 0.0 0.0 - 0.5 K/uL   Basophils Relative 1 %   Basophils Absolute 0.1 0.0 - 0.1 K/uL   Immature Granulocytes 0 %   Abs Immature Granulocytes 0.01 0.00 - 0.07 K/uL    Comment: Performed at [facility], [address], [place], [place] [address]  Comprehensive metabolic panel     Status: Abnormal   Collection Time: 04/10/22  3:46 PM  Result Value Ref Range   Sodium 135 135 - 145 mmol/L   Potassium 3.8 3.5 - 5.1 mmol/L   Chloride 99 98 - 111 mmol/L   CO2 24 22 - 32 mmol/L   Glucose, Bld 121 (H) 70 - 99 mg/dL    Comment: Glucose reference range applies only to samples taken after fasting for at least 8 hours.   BUN 13 8 - 23 mg/dL   Creatinine, Ser 5.18 0.61 - 1.24 mg/dL    Calcium 9.2 8.9 - 84.1 mg/dL   Total Protein 8.0 6.5 - 8.1 g/dL   Albumin 4.5 3.5 - 5.0 g/dL   AST 38 15 - 41 U/L   ALT 23 0 - 44 U/L   Alkaline Phosphatase 64 38 - 126 U/L   Total Bilirubin 0.7 0.3 - 1.2 mg/dL   GFR, Estimated >66 >06 mL/min    Comment: (NOTE) Calculated using the CKD-EPI Creatinine Equation (2021)    Anion gap 12 5 - 15    Comment: Performed at [facility], [address], [place], [place] [address]  Resp Panel by RT-PCR (Flu A&B, Covid) Anterior Nasal Swab     Status: None   Collection Time: 04/10/22  3:46 PM   Specimen: Anterior Nasal Swab  Result Value Ref Range   SARS Coronavirus 2 by RT PCR NEGATIVE NEGATIVE    Comment: (NOTE) SARS-CoV-2 target nucleic acids are NOT DETECTED.  The SARS-CoV-2 RNA is generally detectable in upper respiratory specimens during the acute phase of infection. The lowest concentration of SARS-CoV-2 viral copies this assay can detect is 138 copies/mL. A negative result does not preclude SARS-Cov-2 infection and should not be used as the sole basis for treatment or other patient management decisions. A negative result may occur with  improper specimen collection/handling, submission of specimen other than nasopharyngeal swab, presence of viral mutation(s) within the areas targeted by this assay, and inadequate number of viral copies(<138 copies/mL). A negative result must be combined with clinical observations, patient history, and epidemiological information. The expected result is Negative.  Fact Sheet for Patients:  BloggerCourse.com  Fact Sheet for Healthcare Providers:  SeriousBroker.it  This test is no t yet approved or cleared by the [place] FDA and  has been authorized for detection and/or diagnosis of SARS-CoV-2 by FDA under an Emergency Use Authorization (EUA). This EUA will remain  in effect (meaning this test can be used) for the duration of  the COVID-19 declaration under Section 564(b)(1) of the Act, 21 U.S.C.section 360bbb-3(b)(1), unless the authorization is terminated  or revoked sooner.       Influenza A by PCR NEGATIVE NEGATIVE   Influenza B by PCR NEGATIVE NEGATIVE    Comment: (NOTE) The Xpert Xpress SARS-CoV-2/FLU/RSV plus assay is intended as an aid in the diagnosis of influenza from Nasopharyngeal swab specimens and should not be used as a sole basis for treatment. Nasal washings and aspirates are unacceptable for Xpert Xpress SARS-CoV-2/FLU/RSV testing.  Fact Sheet for Patients: BloggerCourse.com  Fact Sheet for Healthcare  Providers: IncredibleEmployment.be  This test is not yet approved or cleared by the [place] and has been authorized for detection and/or diagnosis of SARS-CoV-2 by FDA under an Emergency Use Authorization (EUA). This EUA will remain in effect (meaning this test can be used) for the duration of the COVID-19 declaration under Section 564(b)(1) of the Act, 21 U.S.C. section 360bbb-3(b)(1), unless the authorization is terminated or revoked.  Performed at [facility], [place] [address] [place], [place] [address]   Blood gas, venous (at [facility] and [facility], not at [facility])     Status: Abnormal   Collection Time: 04/10/22  4:11 PM  Result Value Ref Range   pH, Ven 7.21 (L) 7.25 - 7.43   pCO2, Ven 71 (HH) 44 - 60 mmHg    Comment: CRITICAL RESULT CALLED TO, READ BACK BY AND VERIFIED WITH: EAPON M. RN. @1655  ON 10/23 BY KERLANDIA C.    pO2, Ven <31 (LL) 32 - 45 mmHg    Comment: CRITICAL RESULT CALLED TO, READ BACK BY AND VERIFIED WITH: EAPON M. RN. @1655  ON 10/23 BY KERLANDIA C.    Bicarbonate 28.4 (H) 20.0 - 28.0 mmol/L   Acid-base deficit 1.3 0.0 - 2.0 mmol/L   O2 Saturation 29.4 %   Patient temperature 37.0     Comment: Performed at [facility], [place] [address] [place], [place] [address]  Brain natriuretic  peptide     Status: Abnormal   Collection Time: 04/10/22  6:11 PM  Result Value Ref Range   B Natriuretic Peptide 930.5 (H) 0.0 - 100.0 pg/mL    Comment: Performed at [facility], [place] [address] [place],  [address]   No results found.  Pending Labs [employer] (From admission, onward)     Start     Ordered   Signed and Held  Expectorated Sputum Assessment w Gram Stain, Rflx to [employer]  Once,   R        Signed and Held   Signed and [employer] morning,   R        Signed and Held   Signed and Held  CBC  Tomorrow morning,   R        Signed and Held            Vitals/Pain Today's Vitals   04/10/22 1630 04/10/22 1700 04/10/22 1730 04/10/22 1900  BP: (!) 117/95 109/70 103/80 100/83  Pulse: (!) 138 (!) 132 (!) 122 (!) 117  Resp: 17 (!) 25 18 16   Temp:      TempSrc:      SpO2: 100% 100% 100% 100%    Isolation Precautions No active isolations  Medications Medications  albuterol (PROVENTIL) (2.5 MG/3ML) 0.083% nebulizer solution (10 mg/hr Nebulization Not Given 04/10/22 1548)  guaiFENesin (ROBITUSSIN) 100 MG/5ML liquid 10 mL (has no administration in time range)  ipratropium-albuterol (DUONEB) 0.5-2.5 (3) MG/3ML nebulizer solution 3 mL (3 mLs Nebulization Given 04/10/22 1513)  methylPREDNISolone sodium succinate (SOLU-MEDROL) 125 mg/2 mL injection 125 mg (125 mg Intravenous Given 04/10/22 1514)  albuterol (PROVENTIL) (2.5 MG/3ML) 0.083% nebulizer solution (10 mg/hr Nebulization Given 04/10/22 1532)  magnesium sulfate IVPB 2 g 50 mL (0 g Intravenous Stopped 04/10/22 1725)    Mobility manual wheelchair     Focused Assessments    R Recommendations: See Admitting Provider Note  Report given to:   Additional Notes:

## 2022-04-11 DIAGNOSIS — F419 Anxiety disorder, unspecified: Secondary | ICD-10-CM | POA: Diagnosis present

## 2022-04-11 DIAGNOSIS — Z7952 Long term (current) use of systemic steroids: Secondary | ICD-10-CM | POA: Diagnosis not present

## 2022-04-11 DIAGNOSIS — R64 Cachexia: Secondary | ICD-10-CM | POA: Diagnosis present

## 2022-04-11 DIAGNOSIS — Z7189 Other specified counseling: Secondary | ICD-10-CM

## 2022-04-11 DIAGNOSIS — I10 Essential (primary) hypertension: Secondary | ICD-10-CM | POA: Diagnosis present

## 2022-04-11 DIAGNOSIS — Z20822 Contact with and (suspected) exposure to covid-19: Secondary | ICD-10-CM | POA: Diagnosis present

## 2022-04-11 DIAGNOSIS — Z681 Body mass index (BMI) 19 or less, adult: Secondary | ICD-10-CM | POA: Diagnosis not present

## 2022-04-11 DIAGNOSIS — J441 Chronic obstructive pulmonary disease with (acute) exacerbation: Secondary | ICD-10-CM | POA: Diagnosis present

## 2022-04-11 DIAGNOSIS — Z79899 Other long term (current) drug therapy: Secondary | ICD-10-CM | POA: Diagnosis not present

## 2022-04-11 DIAGNOSIS — E43 Unspecified severe protein-calorie malnutrition: Secondary | ICD-10-CM | POA: Diagnosis present

## 2022-04-11 DIAGNOSIS — F17200 Nicotine dependence, unspecified, uncomplicated: Secondary | ICD-10-CM

## 2022-04-11 DIAGNOSIS — F17210 Nicotine dependence, cigarettes, uncomplicated: Secondary | ICD-10-CM | POA: Diagnosis present

## 2022-04-11 DIAGNOSIS — Z888 Allergy status to other drugs, medicaments and biological substances status: Secondary | ICD-10-CM | POA: Diagnosis not present

## 2022-04-11 DIAGNOSIS — J9601 Acute respiratory failure with hypoxia: Secondary | ICD-10-CM | POA: Diagnosis present

## 2022-04-11 DIAGNOSIS — Z515 Encounter for palliative care: Secondary | ICD-10-CM | POA: Diagnosis not present

## 2022-04-11 DIAGNOSIS — Z66 Do not resuscitate: Secondary | ICD-10-CM | POA: Diagnosis present

## 2022-04-11 LAB — GLUCOSE, CAPILLARY
Glucose-Capillary: 343 mg/dL — ABNORMAL HIGH (ref 70–99)
Glucose-Capillary: 162 mg/dL — ABNORMAL HIGH (ref 70–99)
Glucose-Capillary: 162 mg/dL — ABNORMAL HIGH (ref 70–99)
Glucose-Capillary: 143 mg/dL — ABNORMAL HIGH (ref 70–99)

## 2022-04-11 LAB — EXPECTORATED SPUTUM ASSESSMENT W GRAM STAIN, RFLX TO RESP C

## 2022-04-11 LAB — CBC
HCT: 35.4 % — ABNORMAL LOW (ref 39.0–52.0)
Hemoglobin: 11.7 g/dL — ABNORMAL LOW (ref 13.0–17.0)
MCH: 32.7 pg (ref 26.0–34.0)
MCHC: 33.1 g/dL (ref 30.0–36.0)
MCV: 98.9 fL (ref 80.0–100.0)
Platelets: 269 10*3/uL (ref 150–400)
RBC: 3.58 MIL/uL — ABNORMAL LOW (ref 4.22–5.81)
RDW: 16.6 % — ABNORMAL HIGH (ref 11.5–15.5)
WBC: 4.9 10*3/uL (ref 4.0–10.5)
nRBC: 0 % (ref 0.0–0.2)

## 2022-04-11 LAB — BASIC METABOLIC PANEL
Anion gap: 12 (ref 5–15)
BUN: 21 mg/dL (ref 8–23)
CO2: 24 mmol/L (ref 22–32)
Calcium: 8.7 mg/dL — ABNORMAL LOW (ref 8.9–10.3)
Chloride: 97 mmol/L — ABNORMAL LOW (ref 98–111)
Creatinine, Ser: 1.02 mg/dL (ref 0.61–1.24)
GFR, Estimated: >60 mL/min (ref >60)
Glucose, Bld: 307 mg/dL — ABNORMAL HIGH (ref 70–99)
Potassium: 3.9 mmol/L (ref 3.5–5.1)
Sodium: 133 mmol/L — ABNORMAL LOW (ref 135–145)

## 2022-04-11 LAB — HEMOGLOBIN A1C
Hgb A1c MFr Bld: 5.2 % (ref 4.8–5.6)
Mean Plasma Glucose: 102.54 mg/dL

## 2022-04-11 MED ORDER — ENSURE ENLIVE PO LIQD
237.0000 mL | Freq: Three times a day (TID) | ORAL | Status: DC
  Administered 2022-04-11 – 2022-04-19 (×22): 237 mL via ORAL

## 2022-04-11 MED ORDER — ORAL CARE MOUTH RINSE: 15.0000 mL | OROMUCOSAL | Status: DC | PRN

## 2022-04-11 MED ORDER — INSULIN ASPART 100 UNIT/ML IJ SOLN
0.0000 [IU] | Freq: Three times a day (TID) | INTRAMUSCULAR | Status: DC
  Administered 2022-04-11: 2 [IU] via SUBCUTANEOUS
  Administered 2022-04-12 (×2): 1 [IU] via SUBCUTANEOUS
  Administered 2022-04-13: 2 [IU] via SUBCUTANEOUS
  Administered 2022-04-13 – 2022-04-14 (×2): 1 [IU] via SUBCUTANEOUS
  Administered 2022-04-14: 2 [IU] via SUBCUTANEOUS
  Administered 2022-04-15 (×2): 1 [IU] via SUBCUTANEOUS

## 2022-04-11 MED ORDER — IBUPROFEN 200 MG PO TABS: 400.0000 mg | ORAL_TABLET | Freq: Four times a day (QID) | ORAL | Status: DC | PRN

## 2022-04-11 MED ORDER — MENTHOL 3 MG MT LOZG
1.0000 | LOZENGE | OROMUCOSAL | Status: DC | PRN
  Administered 2022-04-11 – 2022-04-19 (×4): 3 mg via ORAL
  Filled 2022-04-11 (×5): qty 9

## 2022-04-11 MED ORDER — INSULIN ASPART 100 UNIT/ML IJ SOLN: 0.0000 [IU] | Freq: Every day | INTRAMUSCULAR | Status: DC

## 2022-04-11 MED ORDER — IPRATROPIUM-ALBUTEROL 0.5-2.5 (3) MG/3ML IN SOLN
3.0000 mL | Freq: Three times a day (TID) | RESPIRATORY_TRACT | Status: DC
  Administered 2022-04-12 – 2022-04-15 (×10): 3 mL via RESPIRATORY_TRACT
  Filled 2022-04-11 (×10): qty 3

## 2022-04-11 NOTE — Progress Notes (Signed)
PROGRESS NOTE    Jaime Cline  JOA:[id number] DOB: October 16, 1960 DOA: 04/10/2022 PCP: Pcp, No    Brief Narrative:  Severe COPD and recurrent hospitalization presented with a 1 week of worsening shortness of breath.  Found to have respiratory distress in the emergency room and is started on BiPAP and admitted to stepdown unit.   Assessment & Plan:   COPD exacerbation, respiratory distress and acute hypoxemic respiratory failure: Presented with severe respiratory distress needing BiPAP support, hypoxemic, pH 7.2.  Improved with BiPAP use. Continue aggressive bronchodilator therapy, IV steroid taper to oral steroid.   inhalational steroids, deep breathing exercises, incentive spirometry, chest physiotherapy and respiratory therapy consult. Doxycycline for 5 days. Supplemental oxygen to keep saturations more than 92%. BiPAP as needed. Palliative care consultation, will need hospice support ongoing.  Severe protein calorie malnutrition and cachexia: BMI 13.  Nutrition to see.  Palliative care as above.  Smoker: Counseled to quit.  He is trying his best.  Nicotine patch.   DVT prophylaxis: enoxaparin (LOVENOX) injection 30 mg Start: 04/11/22 1000   Code Status: DNR/DNI Family Communication: None at the bedside.  Palliative care communicated Disposition Plan: Status is: Inpatient Remains inpatient appropriate because: Severe respiratory distress     Consultants:  Palliative care  Procedures:  None  Antimicrobials:  Doxycycline 10/23---   Subjective: Patient seen and examined.  Remained on oxygen overnight.  Felt much better than yesterday but cannot move as he gets out of breath on slightest movement.  Agreeable to talk to palliative care.  Objective: Vitals:   04/11/22 0400 04/11/22 0700 04/11/22 0800 04/11/22 0900  BP: 118/85 106/78 (!) 124/91 98/81  Pulse: (!) 104 97 (!) 118 100  Resp: (!) 25 (!) 23 18 (!) 21  Temp: 97.8 F (36.6 C)  (!) 97.3 F (36.3 C)    TempSrc: Oral  Oral   SpO2: 97% 99% 94% 96%  Weight:      Height:        Intake/Output Summary (Last 24 hours) at 04/11/2022 1121 Last data filed at 04/11/2022 0200 Gross per 24 hour  Intake 769.68 ml  Output 100 ml  Net 669.68 ml   Filed Weights   04/10/22 2205  Weight: 41.1 kg    Examination:  General exam: Frail and debilitated.  Cachectic.  Sick looking.  Currently on 2 L oxygen.  Able to talk in short sentences but communicate. Respiratory system: Mostly clear with poor air entry. Cardiovascular system: S1 & S2 heard, RRR. No pedal edema. Gastrointestinal system: Abdomen is nondistended, soft and nontender. No organomegaly or masses felt. Normal bowel sounds heard. Central nervous system: Alert and oriented. No focal neurological deficits. Extremities: Symmetric 5 x 5 power. Skin: No rashes, lesions or ulcers    Data Reviewed: I have personally reviewed following labs and imaging studies  CBC: Recent Labs  Lab 04/10/22 1546 04/11/22 0244  WBC 5.1 4.9  NEUTROABS 3.4  --   HGB 13.6 11.7*  HCT 44.5 35.4*  MCV 107.2* 98.9  PLT 212 601   Basic Metabolic Panel: Recent Labs  Lab 04/10/22 1546 04/11/22 0244  NA 135 133*  K 3.8 3.9  CL 99 97*  CO2 24 24  GLUCOSE 121* 307*  BUN 13 21  CREATININE 0.79 1.02  CALCIUM 9.2 8.7*   GFR: Estimated Creatinine Clearance: 44.2 mL/min (by C-G formula based on SCr of 1.02 mg/dL). Liver Function Tests: Recent Labs  Lab 04/10/22 1546  AST 38  ALT 23  ALKPHOS 64  BILITOT 0.7  PROT 8.0  ALBUMIN 4.5   No results for input(s): "LIPASE", "AMYLASE" in the last 168 hours. No results for input(s): "AMMONIA" in the last 168 hours. Coagulation Profile: No results for input(s): "INR", "PROTIME" in the last 168 hours. Cardiac Enzymes: No results for input(s): "CKTOTAL", "CKMB", "CKMBINDEX", "TROPONINI" in the last 168 hours. BNP (last 3 results) No results for input(s): "PROBNP" in the last 8760 hours. HbA1C: No  results for input(s): "HGBA1C" in the last 72 hours. CBG: Recent Labs  Lab 04/11/22 0622  GLUCAP 343*   Lipid Profile: No results for input(s): "CHOL", "HDL", "LDLCALC", "TRIG", "CHOLHDL", "LDLDIRECT" in the last 72 hours. Thyroid Function Tests: No results for input(s): "TSH", "T4TOTAL", "FREET4", "T3FREE", "THYROIDAB" in the last 72 hours. Anemia Panel: No results for input(s): "VITAMINB12", "FOLATE", "FERRITIN", "TIBC", "IRON", "RETICCTPCT" in the last 72 hours. Sepsis Labs: No results for input(s): "PROCALCITON", "LATICACIDVEN" in the last 168 hours.  Recent Results (from the past 240 hour(s))  Resp Panel by RT-PCR (Flu A&B, Covid) Anterior Nasal Swab     Status: None   Collection Time: 04/10/22  3:46 PM   Specimen: Anterior Nasal Swab  Result Value Ref Range Status   SARS Coronavirus 2 by RT PCR NEGATIVE NEGATIVE Final    Comment: (NOTE) SARS-CoV-2 target nucleic acids are NOT DETECTED.  The SARS-CoV-2 RNA is generally detectable in upper respiratory specimens during the acute phase of infection. The lowest concentration of SARS-CoV-2 viral copies this assay can detect is 138 copies/mL. A negative result does not preclude SARS-Cov-2 infection and should not be used as the sole basis for treatment or other patient management decisions. A negative result may occur with  improper specimen collection/handling, submission of specimen other than nasopharyngeal swab, presence of viral mutation(s) within the areas targeted by this assay, and inadequate number of viral copies(<138 copies/mL). A negative result must be combined with clinical observations, patient history, and epidemiological information. The expected result is Negative.  Fact Sheet for Patients:  BloggerCourse.com  Fact Sheet for Healthcare Providers:  SeriousBroker.it  This test is no t yet approved or cleared by the [place] FDA and  has been authorized  for detection and/or diagnosis of SARS-CoV-2 by FDA under an Emergency Use Authorization (EUA). This EUA will remain  in effect (meaning this test can be used) for the duration of the COVID-19 declaration under Section 564(b)(1) of the Act, 21 U.S.C.section 360bbb-3(b)(1), unless the authorization is terminated  or revoked sooner.       Influenza A by PCR NEGATIVE NEGATIVE Final   Influenza B by PCR NEGATIVE NEGATIVE Final    Comment: (NOTE) The Xpert Xpress SARS-CoV-2/FLU/RSV plus assay is intended as an aid in the diagnosis of influenza from Nasopharyngeal swab specimens and should not be used as a sole basis for treatment. Nasal washings and aspirates are unacceptable for Xpert Xpress SARS-CoV-2/FLU/RSV testing.  Fact Sheet for Patients: BloggerCourse.com  Fact Sheet for Healthcare Providers: SeriousBroker.it  This test is not yet approved or cleared by the [place] FDA and has been authorized for detection and/or diagnosis of SARS-CoV-2 by FDA under an Emergency Use Authorization (EUA). This EUA will remain in effect (meaning this test can be used) for the duration of the COVID-19 declaration under Section 564(b)(1) of the Act, 21 U.S.C. section 360bbb-3(b)(1), unless the authorization is terminated or revoked.  Performed at [facility], [address], [place], [place] [address]   MRSA Next Gen by PCR, Nasal  Status: None   Collection Time: 04/10/22  9:42 PM   Specimen: Nasal Mucosa; Nasal Swab  Result Value Ref Range Status   MRSA by PCR Next Gen NOT DETECTED NOT DETECTED Final    Comment: (NOTE) The GeneXpert MRSA Assay (FDA approved for NASAL specimens only), is one component of a comprehensive MRSA colonization surveillance program. It is not intended to diagnose MRSA infection nor to guide or monitor treatment for MRSA infections. Test performance is not FDA approved in patients less  than 84 years old. Performed at [facility], [address] [place], [place] [address]          Radiology Studies: [facility] Chest Port 1 View  Result Date: 04/10/2022 CLINICAL DATA:  Worsening shortness of breath. EXAM: PORTABLE CHEST 1 VIEW COMPARISON:  January 24, 2022 FINDINGS: The heart size and mediastinal contours are within normal limits. 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 Both lungs are otherwise clear. The visualized skeletal structures are unremarkable. IMPRESSION: COPD without evidence of acute or active cardiopulmonary disease. Electronically Signed   By: Aram Candela M.D.   On: 04/10/2022 21:34        Scheduled Meds:  atorvastatin  20 mg Oral Daily   Chlorhexidine Gluconate Cloth  6 each Topical Daily   doxycycline  100 mg Oral Q12H   enoxaparin (LOVENOX) injection  30 mg Subcutaneous Q24H   famotidine  20 mg Oral BID   feeding supplement  237 mL Oral TID BM   insulin aspart  0-5 Units Subcutaneous QHS   insulin aspart  0-9 Units Subcutaneous TID WC   ipratropium-albuterol  3 mL Nebulization Q6H   methylPREDNISolone (SOLU-MEDROL) injection  40 mg Intravenous Q12H   Followed by   Melene Muller ON 04/12/2022] predniSONE  40 mg Oral Q breakfast   mometasone-formoterol  2 puff Inhalation BID   nicotine  14 mg Transdermal Daily   mouth rinse  15 mL Mouth Rinse 4 times per day   sertraline  50 mg Oral Daily   Continuous Infusions:   LOS: 0 days    Time spent: 35 minutes    Dorcas Carrow, MD [facility] Pager [phone]

## 2022-04-11 NOTE — Progress Notes (Signed)
Chaplain was consulted to help Jaime Cline with his HCPOA paperwork and provide emotional and spiritual support.  He had just learned earlier that morning that his time was much more limited than he expected.  We spent some time talking about his fears of dying and of leaving his family.  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  We spoke about his son and how he raised him from 81 months of age with the help of his mother who is now deceased. Chaplain provided prayer, at [facility] request.  Given the nature of the conversation, chaplain asked if he wanted to do paperwork tomorrow and he agreed that would be better. His family is aware of the conversation he had this morning with his doctors.  Chaplain will return tomorrow to assist with HCPOA.  [address], [place] Pager, [phone]

## 2022-04-11 NOTE — Progress Notes (Signed)
Initial Nutrition Assessment  DOCUMENTATION CODES:   Severe malnutrition in context of chronic illness  INTERVENTION:  -Continue regular diet as tolerated -Provide meal preferences to optimize po intake -Provide Ensure Plus HP TID (350kcal, 20g protein/bottle)  NUTRITION DIAGNOSIS:  Severe Malnutrition related to chronic illness (COPD) as evidenced by severe fat depletion, severe muscle depletion, percent weight loss.  GOAL:  Patient will meet greater than or equal to 90% of their needs  MONITOR:  PO intake, Supplement acceptance  REASON FOR ASSESSMENT:  Consult Assessment of nutrition requirement/status  ASSESSMENT:  Pt is a 61yo M with PMH of COPD, asthma, HTN, and alcohol abuse who presents with worsening SOB.  Visited pt at bedside this AM. He had lunch tray untouched on beside table. Reports he lost his appetite when he was told he had end stage COPD. Offered ONS instead and pt agreeable. Pt reports eating well PTA but chewing is difficult with SOB. Weight history in EMR reviewed and shows a significant 7.4% unintended weight loss in the last  2 months. NFPE reveals severe muscle wasting and severe fat loss. Pt meets ASPEN criteria for severe protein calorie malnutrition.   Discussed w/ pt his increased calorie and protein needs because of increased work of breathing. Pt reports he has been told this before. Reports drinking Ensure at home and could drink it all day long. Provided coupons for home use to eliminate cost barriers to ONS. Discussed other high calorie foods such as peanut butter or cheese. Pt reports he eats spoonfuls of peanut butter throughout the day. Continue regular diet as tolerated. Provide Ensure Plus HP TID (350kcal, 20g protein/bottle).  Medications reviewed and include: lipitor, doxycycline, lovenox, pepcid, solu-medrol,   Labs reviewed: Na:133, BG:121-343, Cr:1.02, BNP:930.5   NUTRITION - FOCUSED PHYSICAL EXAM:  Flowsheet Row Most Recent Value   Orbital Region Severe depletion  Upper Arm Region Severe depletion  Buccal Region Severe depletion  Temple Region Severe depletion  Clavicle Bone Region Severe depletion  Clavicle and Acromion Bone Region Severe depletion  Scapular Bone Region Severe depletion  Dorsal Hand Severe depletion  Patellar Region Severe depletion  Anterior Thigh Region Severe depletion  Posterior Calf Region Severe depletion  Hair Reviewed  Eyes Reviewed  Mouth Reviewed  Skin Reviewed  Nails Reviewed       Diet Order:   Diet Order             Diet regular Room service appropriate? Yes; Fluid consistency: Thin  Diet effective now                   EDUCATION NEEDS:   Education needs have been addressed  Skin:  Skin Assessment: Reviewed RN Assessment  Last BM:  10/22  Height:  Ht Readings from Last 1 Encounters:  04/10/22 5\' 10"  (1.778 m)    Weight:  Wt Readings from Last 1 Encounters:  04/10/22 41.1 kg    Ideal Body Weight:  75.5 kg  BMI:  Body mass index is 13 kg/m.  Estimated Nutritional Needs:  Kcal:  1715-2210kcal ([place] x 1.4-1.8 (REE:1227))  Protein:  80-100g (2.0-2.5g/kg)  Fluid:  >1768mL  Candise Bowens, MS, RD, LDN, CNSC See AMiON for contact information

## 2022-04-11 NOTE — Inpatient Diabetes Management (Signed)
Inpatient Diabetes Program Recommendations  AACE/ADA: New Consensus Statement on Inpatient Glycemic Control (2015)  Target Ranges:  Prepandial:   less than 140 mg/dL      Peak postprandial:   less than 180 mg/dL (1-2 hours)      Critically ill patients:  140 - 180 mg/dL   Lab Results  Component Value Date   GLUCAP 343 (H) 04/11/2022   HGBA1C 5.1% 12/31/2012    Review of Glycemic Control  Latest Reference Range & Units 04/10/22 15:46 04/11/22 02:44  Glucose 70 - 99 mg/dL 121 (H) 307 (H)  (H): Data is abnormally high Diabetes history: no hx noted Outpatient Diabetes medications: none Current orders for Inpatient glycemic control: none Solumedrol 125 mg x 1, 40 mg BID to taper to Prednisone 40 mg QD 10/25  Inpatient Diabetes Program Recommendations:    Consider: -Adding A1C  -Adding Novolog 0-15 units TID & HS  Thanks, Bronson Curb, MSN, RNC-OB Diabetes Coordinator [phone] (8a-5p)

## 2022-04-11 NOTE — Progress Notes (Signed)
PT Cancellation Note  Patient Details Name: DRAVIN LANCE MRN: [id number] DOB: [birth year]   Cancelled Treatment:    Reason Eval/Treat Not Completed: Patient declined, no reason specified, will check back another time. [place] Office [phone] Weekend pager-[phone]    Claretha Cooper 04/11/2022, 12:43 PM

## 2022-04-11 NOTE — Progress Notes (Signed)
  Daily Progress Note   Patient Name: Jaime Cline       Date: 04/11/2022 DOB: 03/11/1961  Age: 61 y.o. MRN#: [id number] Attending Physician: Barb Merino, MD Primary Care Physician: Pcp, No Admit Date: 04/10/2022 Length of Stay: 0 days  This provider will complete full consult note later. Wanted to place short note due to update in status. After discussions with with patient today, code status has been updated to DNR/DNI.  Patient would like to complete HCPOA documentation naming his brother and sister as his medical decision makers should he be unable to make medical decisions for himself. Consulted chaplain to assist with completion of documentation.   Thank you for allowing the palliative care team to participate in the care [facility].  Chelsea Aus, DO Palliative Care Provider PMT # [phone]

## 2022-04-11 NOTE — Progress Notes (Signed)
OT Cancellation Note  Patient Details Name: Jaime Cline MRN: [id number] DOB: Aug 22, 1960   Cancelled Treatment:    Reason Eval/Treat Not Completed: Fatigue/lethargy limiting ability to participate Patient declined to get out of bed at this time. OT to continue to follow and  check back as schedule will allow.  Rennie Plowman, [facility] Office# [phone]  04/11/2022, 10:21 AM

## 2022-04-11 NOTE — Consult Note (Signed)
Consultation Note Date: 04/11/2022   Patient Name: Jaime Cline  DOB: 1961/04/28  MRN: [id number]  Age / Sex: 61 y.o., male   PCP: Pcp, No Referring Physician: Barb Merino, MD  Reason for Consultation: Establishing goals of care     Chief Complaint/History of Present Illness:   Patient is a 61 year old male with a past medical history of COPD with ongoing tobacco use, hypertension, and recurrent hospitalization who was admitted on 04/10/2022 for managing of worsening shortness of breath for the past week.  Patient was found to be in respiratory distress in the emergency room with tripoding; required BiPAP administration.  Patient admitted to the hospital for management of COPD exacerbation with severe symptoms.  Palliative care consulted to assist with complex medical decision making.  Extensive review of EMR prior to presenting to bedside.  Also discussed care with bedside RN for updates.  Presented to bedside and introduced myself and my role as a member of the palliative care team to the patient.  Patient notes he likes to go by "Jaime Cline".  Inquired about updates [place] had heard from his providers.  Patient was honest and that he had heard his lung disease is very severe and that he is likely reaching the end of his life.  He has heard that his difficulty with breathing is affecting his ability to eat and maintain his calorie intake and even walk.  Patient described how he can't even stand at the bedside now without getting short of breath and that is a miserable feeling.  Patient also described how he felt like he was going to "die" when he came to the emergency room because he was so short of breath and could not catch it.  Empathized with patient's difficult situation. Patient acknowledged that it is all in "God's hands".  He even described how for some reason he bought Christmas presents early this year and actually already handed them out to family and he wonders if this was God telling him  his time was growing short.  Spent time learning about the patient prior to this hospitalization.  Patient notes he lives with his sister-in-law, Jaime Cline.  He moved here about a year ago from [place] where he had lived for many years.  He unfortunately had to leave his dog, Sugar, when he moved here to [place] which was very hard on him because that dog meant oral to him.  He discussed how he has always been a very big animal lover and given all he could to taking care of animals in the past.  With hearing how patient had heard about his disease progress, inquired how he felt his care should proceed.  Patient feels that he does need nursing home placement as his sister-in-law cannot care for him and he can't even stand to care for himself at this point.  We discussed that I would reach out to case manager to assist with this transitions of care as benefits for nursing home could affect if patient could receive continued palliative care versus hospice care moving forward.  Patient did ask about his prognosis and expressed that with his continued unintentional weight loss, inability to move without shortness of breath, increasing visits to medical providers for worsening COPD, hypercapnia on admission, and resting tachycardia, patient is likely reaching end-of-life care and would be appropriate for hospice.  Discussed differences and similarities between palliative care and hospice care.  Further determination on which he could receive to support his care moving forward  may be determined on where he can receive care.  Patient acknowledged hearing this.  With patient's permission, inquired if he had completed any advance care planning paperwork.  Patient noted that he has not.  Inquired who he would want to make medical decisions for him if he was unable to make medical decisions for himself.  Patient noted that he would want his brother Jaime Cline and his Sister Jaime Cline to be his medical  decision makers if he was unable to make medical decisions for himself.  Patient does have a son, Jaime Cline, who he does not want to have to make medical decisions for him.  We discussed importance of completing HCPOA documentation while hospitalized since patient does not want his son to make medical decisions for him. With permission, also discussed patient's CODE STATUS.  Expressed concern that should patient's heart stop or he stop breathing, interventions such as chest compressions and putting him on a breathing machine would not reverse his underlying disease process and could instead because suffering.  We discussed the idea of DNR/DNI.  Patient agreed that should he is heart stop or he stops breathing, he would want to be allowed to pass peacefully.  We discussed this provider will update CODE STATUS to DNR/DNI.  Patient agreed with this change.  All questions answered at that time.  Noted conversations would continue during patient's hospitalization to figure out best support moving forward.  Updated bedside RN after conversation.  Also discussed care with dietitian who noted patient's severe malnutrition.  Primary Diagnoses  Present on Admission:  COPD with acute exacerbation (HCC)  Smoker  Protein-calorie malnutrition, severe   Palliative Review of Systems: Patient notes shortness of breath with any movement.  Patient notes he is unable to stand, let alone walk, without feeling short of breath. I have reviewed the medical record, interviewed the patient and family, and examined the patient. The following aspects are pertinent.  Past Medical History:  Diagnosis Date   Alcohol abuse    Asthma    Bronchitis    Chronic back pain    Chronic knee pain    Hypertension    Social History   Socioeconomic History   Marital status: Single    Spouse name: Not on file   Number of children: Not on file   Years of education: Not on file   Highest education level: Not on file   Occupational History   Not on file  Tobacco Use   Smoking status: Every Day    Packs/day: 1.00    Types: Cigarettes   Smokeless tobacco: Not on file  Substance and Sexual Activity   Alcohol use: Yes    Alcohol/week: 28.0 standard drinks of alcohol    Types: 28 Cans of beer per week   Drug use: No   Sexual activity: Not on file  Other Topics Concern   Not on file  Social History Narrative   Not on file   Social Determinants of Health   Financial Resource Strain: Not on file  Food Insecurity: No Food Insecurity (04/10/2022)   Hunger Vital Sign    Worried About Running Out of Food in the Last Year: Never true    Ran Out of Food in the Last Year: Never true  Transportation Needs: No Transportation Needs (04/10/2022)   PRAPARE - Administrator, Civil Service (Medical): No    Lack of Transportation (Non-Medical): No  Physical Activity: Not on file  Stress: Not on file  Social Connections: Not on file   Family History  Problem Relation Age of Onset   Cancer Mother    Cancer Sister    Scheduled Meds:  atorvastatin  20 mg Oral Daily   Chlorhexidine Gluconate Cloth  6 each Topical Daily   doxycycline  100 mg Oral Q12H   enoxaparin (LOVENOX) injection  30 mg Subcutaneous Q24H   famotidine  20 mg Oral BID   ipratropium-albuterol  3 mL Nebulization Q6H   methylPREDNISolone (SOLU-MEDROL) injection  40 mg Intravenous Q12H   Followed by   Melene Muller ON 04/12/2022] predniSONE  40 mg Oral Q breakfast   mometasone-formoterol  2 puff Inhalation BID   nicotine  14 mg Transdermal Daily   mouth rinse  15 mL Mouth Rinse 4 times per day   sertraline  50 mg Oral Daily   Continuous Infusions: PRN Meds:.acetaminophen **OR** acetaminophen, albuterol, guaiFENesin, ipratropium-albuterol, menthol-cetylpyridinium, ondansetron **OR** ondansetron (ZOFRAN) IV, mouth rinse Allergies  Allergen Reactions   Other Other (See Comments)    BP med from [facility] about 3 years ago, caused some  seizures, due to getting wrong BP med at that time.  Unknown name of med   CBC:    Component Value Date/Time   WBC 4.9 04/11/2022 0244   HGB 11.7 (L) 04/11/2022 0244   HCT 35.4 (L) 04/11/2022 0244   PLT 269 04/11/2022 0244   MCV 98.9 04/11/2022 0244   NEUTROABS 3.4 04/10/2022 1546   LYMPHSABS 1.2 04/10/2022 1546   MONOABS 0.6 04/10/2022 1546   EOSABS 0.0 04/10/2022 1546   BASOSABS 0.1 04/10/2022 1546   Comprehensive Metabolic Panel:    Component Value Date/Time   NA 133 (L) 04/11/2022 0244   K 3.9 04/11/2022 0244   CL 97 (L) 04/11/2022 0244   CO2 24 04/11/2022 0244   BUN 21 04/11/2022 0244   CREATININE 1.02 04/11/2022 0244   CREATININE 0.93 12/31/2012 1707   GLUCOSE 307 (H) 04/11/2022 0244   CALCIUM 8.7 (L) 04/11/2022 0244   AST 38 04/10/2022 1546   ALT 23 04/10/2022 1546   ALKPHOS 64 04/10/2022 1546   BILITOT 0.7 04/10/2022 1546   PROT 8.0 04/10/2022 1546   ALBUMIN 4.5 04/10/2022 1546    Physical Exam: Vital Signs: BP 118/85   Pulse (!) 104   Temp 97.8 F (36.6 C) (Oral)   Resp (!) 25   Ht 5\' 10"  (1.778 m)   Wt 41.1 kg   SpO2 97%   BMI 13.00 kg/m  SpO2: SpO2: 97 % O2 Device: O2 Device: Nasal Cannula Intake/Output Summary (Last 24 hours) at 04/11/2022 0734 Last data filed at 04/11/2022 0200 Gross per 24 hour  Intake 769.68 ml  Output 100 ml  Net 669.68 ml   LBM: Last BM Date : 04/09/22 Baseline Weight: Weight: 41.1 kg Most recent weight: Weight: 41.1 kg  General: NAD, alert, laying in bed, cachectic, chronically ill-appearing Eyes: conjunctiva clear, anicteric sclera HENT: normocephalic, atraumatic, dry mucous membranes Cardiovascular: Tachycardia noted Respiratory: Poor air entry on auscultation Abdomen: Not distended Extremities: no edema in LE b/l Skin: no rashes or lesions on visible skin Neuro: A&Ox4, following commands easily Psych: appropriately answers all questions          Palliative Performance Scale: 30%           Additional Data  Reviewed: Recent Labs    04/10/22 1546 04/11/22 0244  WBC 5.1 4.9  HGB 13.6 11.7*  PLT 212 269  NA 135 133*  BUN 13 21  CREATININE  0.79 1.02    Imaging: DG Chest Port 1 View CLINICAL DATA:  Worsening shortness of breath.  EXAM: PORTABLE CHEST 1 VIEW  COMPARISON:  January 24, 2022  FINDINGS: The heart size and mediastinal contours are within normal limits. 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 Both lungs are otherwise clear. The visualized skeletal structures are unremarkable.  IMPRESSION: COPD without evidence of acute or active cardiopulmonary disease.  Electronically Signed   By: Aram Candela M.D.   On: 04/10/2022 21:34    I personally reviewed recent imaging.   Palliative Care Assessment and Plan Summary of Established Goals of Care and Medical Treatment Preferences   Patient is a 61 year old male with a past medical history of COPD with ongoing tobacco use, hypertension, and recurrent hospitalization who was admitted on 04/10/2022 for managing of worsening shortness of breath for the past week.  Patient was found to be in respiratory distress in the emergency room with tripoding; required BiPAP administration.  Patient admitted to the hospital for management of COPD exacerbation with severe symptoms.  Palliative care consulted to assist with complex medical decision making.  # Complex medical decision making/goals of care  -Extensive conversation with patient as described above in HPI.  Patient has heard that he has severe lung disease.  Patient notes that because of his underlying disease, he is reaching the end of his life.  Patient noted importance of focusing on quality of life moving forward over quantity.  Patient wanted to discuss his prognosis and so explained that though COPD can vary, concerned looking more at weeks to months.   -Patient noted he has already called his brother and sister to update them regarding the  conversation he had with the hospitalist this morning.  Patient planning to call his son as well and update him regarding severe lung disease.  -Patient does not feel he can care for himself at home and his sister-in-law whom he lives with cannot care for him.  Patient open to the idea of going to a nursing home for long-term care.  Have involved case manager to assist with this possibility. -Discussed similarities and differences between palliative care and hospice.  Based on hospice criteria for COPD, patient currently meets COPD admission standards.  Determination of support with palliative care versus hospice care may be further determined moving forward based on patient's discharge planning.  -  Code Status: DNR    -After discussion with patient today, CODE STATUS updated from full code to DNR/DNI. Prognosis: Weeks to months (<6 months)  # Symptom management  -May need to consider use of opioids to assist with dyspnea management moving forward.  We will continue to monitor.  # Psycho-social/Spiritual Support:  - Support System: Brother, sister, son - Desire for further Chaplain support:yes.  Spiritual support is incredibly important to the patient.  He appreciates prayers and regular chaplain visits.  Informed chaplain of this.  # Discharge Planning: TBD  Thank you for allowing the palliative care team to participate in the care Jenelle Mages.  Alvester Morin, DO Palliative Care Provider PMT # [phone]  If patient remains symptomatic despite maximum doses, please call PMT at [phone] between 0700 and 1900. Outside of these hours, please call attending, as PMT does not have night coverage.

## 2022-04-12 DIAGNOSIS — J441 Chronic obstructive pulmonary disease with (acute) exacerbation: Secondary | ICD-10-CM | POA: Diagnosis not present

## 2022-04-12 DIAGNOSIS — R0600 Dyspnea, unspecified: Secondary | ICD-10-CM

## 2022-04-12 LAB — GLUCOSE, CAPILLARY
Glucose-Capillary: 112 mg/dL — ABNORMAL HIGH (ref 70–99)
Glucose-Capillary: 122 mg/dL — ABNORMAL HIGH (ref 70–99)
Glucose-Capillary: 126 mg/dL — ABNORMAL HIGH (ref 70–99)
Glucose-Capillary: 118 mg/dL — ABNORMAL HIGH (ref 70–99)

## 2022-04-12 LAB — EXPECTORATED SPUTUM ASSESSMENT W GRAM STAIN, RFLX TO RESP C

## 2022-04-12 LAB — DIFFERENTIAL
Abs Immature Granulocytes: 0.08 10*3/uL — ABNORMAL HIGH (ref 0.00–0.07)
Basophils Absolute: 0 10*3/uL (ref 0.0–0.1)
Basophils Relative: 0 %
Eosinophils Absolute: 0 10*3/uL (ref 0.0–0.5)
Eosinophils Relative: 0 %
Immature Granulocytes: 1 %
Lymphocytes Relative: 10 %
Lymphs Abs: 0.9 10*3/uL (ref 0.7–4.0)
Monocytes Absolute: 0.9 10*3/uL (ref 0.1–1.0)
Monocytes Relative: 10 %
Neutro Abs: 7.1 10*3/uL (ref 1.7–7.7)
Neutrophils Relative %: 79 %

## 2022-04-12 LAB — CBC
HCT: 31.8 % — ABNORMAL LOW (ref 39.0–52.0)
Hemoglobin: 10.6 g/dL — ABNORMAL LOW (ref 13.0–17.0)
MCH: 33.2 pg (ref 26.0–34.0)
MCHC: 33.3 g/dL (ref 30.0–36.0)
MCV: 99.7 fL (ref 80.0–100.0)
Platelets: 214 10*3/uL (ref 150–400)
RBC: 3.19 MIL/uL — ABNORMAL LOW (ref 4.22–5.81)
RDW: 16.9 % — ABNORMAL HIGH (ref 11.5–15.5)
WBC: 8.9 10*3/uL (ref 4.0–10.5)
nRBC: 0 % (ref 0.0–0.2)

## 2022-04-12 LAB — PHOSPHORUS: Phosphorus: 3.7 mg/dL (ref 2.5–4.6)

## 2022-04-12 LAB — MAGNESIUM: Magnesium: 1.9 mg/dL (ref 1.7–2.4)

## 2022-04-12 MED ORDER — MORPHINE SULFATE (PF) 2 MG/ML IV SOLN
1.0000 mg | INTRAVENOUS | Status: DC | PRN
  Administered 2022-04-12 (×3): 1 mg via INTRAVENOUS
  Filled 2022-04-12 (×4): qty 1

## 2022-04-12 MED ORDER — METHYLPREDNISOLONE SODIUM SUCC 40 MG IJ SOLR
40.0000 mg | Freq: Two times a day (BID) | INTRAMUSCULAR | Status: DC
  Administered 2022-04-12 – 2022-04-15 (×6): 40 mg via INTRAVENOUS
  Filled 2022-04-12 (×6): qty 1

## 2022-04-12 NOTE — Progress Notes (Signed)
PROGRESS NOTE    Jaime Cline  HRC:[id number] DOB: 1961/03/13 DOA: 04/10/2022 PCP: Pcp, No    Brief Narrative:  Severe COPD and recurrent hospitalization presented with a 1 week of worsening shortness of breath.  Found to have respiratory distress in the emergency room and is started on BiPAP and admitted to stepdown unit.   Assessment & Plan:   COPD exacerbation, respiratory distress and acute hypoxemic respiratory failure: Presented with severe respiratory distress needing BiPAP support, hypoxemic, pH 7.2.  Improved with BiPAP use. Continue aggressive bronchodilator therapy, prolonged steroid taper. inhalational steroids, deep breathing exercises, incentive spirometry, chest physiotherapy and respiratory therapy consult. Doxycycline for 5 days. Supplemental oxygen to keep saturations more than 92%. BiPAP as needed.  Not used for the last 24 hours. Followed by palliative care.  Appreciate help.  Agreed for long-term hospice follow-up.  Severe protein calorie malnutrition and cachexia: BMI 13.  Nutrition following.  Palliative care as above.  Smoker: Counseled to quit.  He is trying his best.  Nicotine patch.   DVT prophylaxis: enoxaparin (LOVENOX) injection 30 mg Start: 04/11/22 1000   Code Status: DNR/DNI Family Communication: None at the bedside.  Will discuss with family. Disposition Plan: Status is: Inpatient Remains inpatient appropriate because: Severe COPD.  Now not needing BiPAP.  Can move to telemetry bed.     Consultants:  Palliative care  Procedures:  None  Antimicrobials:  Doxycycline 10/23---   Subjective:  Patient seen and examined.  Overnight no events.  Still feels wheezy and short of breath but able to keep up conversation. He was very appreciative of palliative care and chaplain visiting him and explaining him the situation, limited life expectancy and severe nature of disease.  He wants his brother and sister to be informed and  explained.  Objective: Vitals:   04/12/22 0700 04/12/22 0720 04/12/22 0800 04/12/22 0801  BP: 100/78  108/79   Pulse: 75  78   Resp: 18  18   Temp:  (!) 97.5 F (36.4 C)    TempSrc:  Oral    SpO2: 100%   98%  Weight:      Height:        Intake/Output Summary (Last 24 hours) at 04/12/2022 0844 Last data filed at 04/12/2022 0400 Gross per 24 hour  Intake --  Output 725 ml  Net -725 ml    Filed Weights   04/10/22 2205  Weight: 41.1 kg    Examination:  General exam: Frail and debilitated.  Cachectic.  Currently on 2 L oxygen.  Able to talk in short sentences but communicate. Respiratory system: Bilateral expiratory and expiratory wheezes.  Equal air entry. Cardiovascular system: S1 & S2 heard, RRR. No pedal edema. Gastrointestinal system: Abdomen is nondistended, soft and nontender. No organomegaly or masses felt. Normal bowel sounds heard. Central nervous system: Alert and oriented. No focal neurological deficits. Extremities: Symmetric 5 x 5 power. Skin: No rashes, lesions or ulcers    Data Reviewed: I have personally reviewed following labs and imaging studies  CBC: Recent Labs  Lab 04/10/22 1546 04/11/22 0244 04/12/22 0513  WBC 5.1 4.9 8.9  NEUTROABS 3.4  --  7.1  HGB 13.6 11.7* 10.6*  HCT 44.5 35.4* 31.8*  MCV 107.2* 98.9 99.7  PLT 212 269 214    Basic Metabolic Panel: Recent Labs  Lab 04/10/22 1546 04/11/22 0244 04/12/22 0241  NA 135 133*  --   K 3.8 3.9  --   CL 99 97*  --  CO2 24 24  --   GLUCOSE 121* 307*  --   BUN 13 21  --   CREATININE 0.79 1.02  --   CALCIUM 9.2 8.7*  --   MG  --   --  1.9  PHOS  --   --  3.7    GFR: Estimated Creatinine Clearance: 44.2 mL/min (by C-G formula based on SCr of 1.02 mg/dL). Liver Function Tests: Recent Labs  Lab 04/10/22 1546  AST 38  ALT 23  ALKPHOS 64  BILITOT 0.7  PROT 8.0  ALBUMIN 4.5    No results for input(s): "LIPASE", "AMYLASE" in the last 168 hours. No results for input(s):  "AMMONIA" in the last 168 hours. Coagulation Profile: No results for input(s): "INR", "PROTIME" in the last 168 hours. Cardiac Enzymes: No results for input(s): "CKTOTAL", "CKMB", "CKMBINDEX", "TROPONINI" in the last 168 hours. BNP (last 3 results) No results for input(s): "PROBNP" in the last 8760 hours. HbA1C: Recent Labs    04/11/22 1120  HGBA1C 5.2   CBG: Recent Labs  Lab 04/11/22 0622 04/11/22 1249 04/11/22 1632 04/11/22 2158 04/12/22 0738  GLUCAP 343* 162* 162* 143* 112*    Lipid Profile: No results for input(s): "CHOL", "HDL", "LDLCALC", "TRIG", "CHOLHDL", "LDLDIRECT" in the last 72 hours. Thyroid Function Tests: No results for input(s): "TSH", "T4TOTAL", "FREET4", "T3FREE", "THYROIDAB" in the last 72 hours. Anemia Panel: No results for input(s): "VITAMINB12", "FOLATE", "FERRITIN", "TIBC", "IRON", "RETICCTPCT" in the last 72 hours. Sepsis Labs: No results for input(s): "PROCALCITON", "LATICACIDVEN" in the last 168 hours.  Recent Results (from the past 240 hour(s))  Resp Panel by RT-PCR (Flu A&B, Covid) Anterior Nasal Swab     Status: None   Collection Time: 04/10/22  3:46 PM   Specimen: Anterior Nasal Swab  Result Value Ref Range Status   SARS Coronavirus 2 by RT PCR NEGATIVE NEGATIVE Final    Comment: (NOTE) SARS-CoV-2 target nucleic acids are NOT DETECTED.  The SARS-CoV-2 RNA is generally detectable in upper respiratory specimens during the acute phase of infection. The lowest concentration of SARS-CoV-2 viral copies this assay can detect is 138 copies/mL. A negative result does not preclude SARS-Cov-2 infection and should not be used as the sole basis for treatment or other patient management decisions. A negative result may occur with  improper specimen collection/handling, submission of specimen other than nasopharyngeal swab, presence of viral mutation(s) within the areas targeted by this assay, and inadequate number of viral copies(<138 copies/mL). A  negative result must be combined with clinical observations, patient history, and epidemiological information. The expected result is Negative.  Fact Sheet for Patients:  BloggerCourse.com  Fact Sheet for Healthcare Providers:  SeriousBroker.it  This test is no t yet approved or cleared by the [place] FDA and  has been authorized for detection and/or diagnosis of SARS-CoV-2 by FDA under an Emergency Use Authorization (EUA). This EUA will remain  in effect (meaning this test can be used) for the duration of the COVID-19 declaration under Section 564(b)(1) of the Act, 21 U.S.C.section 360bbb-3(b)(1), unless the authorization is terminated  or revoked sooner.       Influenza A by PCR NEGATIVE NEGATIVE Final   Influenza B by PCR NEGATIVE NEGATIVE Final    Comment: (NOTE) The Xpert Xpress SARS-CoV-2/FLU/RSV plus assay is intended as an aid in the diagnosis of influenza from Nasopharyngeal swab specimens and should not be used as a sole basis for treatment. Nasal washings and aspirates are unacceptable for Xpert Xpress SARS-CoV-2/FLU/RSV testing.  Fact Sheet for Patients: BloggerCourse.com  Fact Sheet for Healthcare Providers: SeriousBroker.it  This test is not yet approved or cleared by the [place] FDA and has been authorized for detection and/or diagnosis of SARS-CoV-2 by FDA under an Emergency Use Authorization (EUA). This EUA will remain in effect (meaning this test can be used) for the duration of the COVID-19 declaration under Section 564(b)(1) of the Act, 21 U.S.C. section 360bbb-3(b)(1), unless the authorization is terminated or revoked.  Performed at [facility], [address] [place], [place] [address]   MRSA Next Gen by PCR, Nasal     Status: None   Collection Time: 04/10/22  9:42 PM   Specimen: Nasal Mucosa; Nasal Swab  Result Value  Ref Range Status   MRSA by PCR Next Gen NOT DETECTED NOT DETECTED Final    Comment: (NOTE) The GeneXpert MRSA Assay (FDA approved for NASAL specimens only), is one component of a comprehensive MRSA colonization surveillance program. It is not intended to diagnose MRSA infection nor to guide or monitor treatment for MRSA infections. Test performance is not FDA approved in patients less than 36 years old. Performed at [facility], [address] [place], [place] [address]   Expectorated Sputum Assessment w Gram Stain, Rflx to Resp Cult     Status: None   Collection Time: 04/11/22  5:02 PM   Specimen: Expectorated Sputum  Result Value Ref Range Status   Specimen Description EXPECTORATED SPUTUM  Final   Special Requests NONE  Final   Sputum evaluation   Final    Sputum specimen not acceptable for testing.  Please recollect.   Performed at [facility], [address], [place], [place] [address]    Report Status 04/11/2022 FINAL  Final  Expectorated Sputum Assessment w Gram Stain, Rflx to Resp Cult     Status: None   Collection Time: 04/11/22  8:11 PM   Specimen: Expectorated Sputum  Result Value Ref Range Status   Specimen Description EXPECTORATED SPUTUM  Final   Special Requests NONE  Final   Sputum evaluation   Final    THIS SPECIMEN IS ACCEPTABLE FOR SPUTUM CULTURE Performed at [facility], [address] [place], [place] [address]    Report Status 04/12/2022 FINAL  Final  Culture, Respiratory w Gram Stain     Status: None (Preliminary result)   Collection Time: 04/11/22  8:11 PM  Result Value Ref Range Status   Specimen Description   Final    EXPECTORATED SPUTUM Performed at [facility], [address] [place], [place] [address]    Special Requests   Final    NONE Reflexed from [phone] Performed at [facility], [address], [place], [place] [address]    Gram Stain    Final    RARE WBC PRESENT, PREDOMINANTLY PMN FEW GRAM POSITIVE COCCI IN PAIRS IN CHAINS RARE GRAM NEGATIVE RODS Performed at [facility], [address] [place], [place] [address]    Culture PENDING  Incomplete   Report Status PENDING  Incomplete         Radiology Studies: DG Chest Port 1 View  Result Date: 04/10/2022 CLINICAL DATA:  Worsening shortness of breath. EXAM: PORTABLE CHEST 1 VIEW COMPARISON:  January 24, 2022 FINDINGS: The heart size and mediastinal contours are within normal limits. 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 Both lungs are otherwise clear. The visualized skeletal structures are unremarkable. IMPRESSION:  COPD without evidence of acute or active cardiopulmonary disease. Electronically Signed   By: Virgina Norfolk M.D.   On: 04/10/2022 21:34        Scheduled Meds:  atorvastatin  20 mg Oral Daily   Chlorhexidine Gluconate Cloth  6 each Topical Daily   doxycycline  100 mg Oral Q12H   enoxaparin (LOVENOX) injection  30 mg Subcutaneous Q24H   famotidine  20 mg Oral BID   feeding supplement  237 mL Oral TID BM   insulin aspart  0-5 Units Subcutaneous QHS   insulin aspart  0-9 Units Subcutaneous TID WC   ipratropium-albuterol  3 mL Nebulization TID   mometasone-formoterol  2 puff Inhalation BID   nicotine  14 mg Transdermal Daily   predniSONE  40 mg Oral Q breakfast   sertraline  50 mg Oral Daily   Continuous Infusions:   LOS: 1 day    Time spent: 35 minutes    Barb Merino, MD [facility] Pager [phone]

## 2022-04-12 NOTE — Progress Notes (Signed)
Chaplain followed up with Jaime Cline today, but he was not feeling well and did not wish to talk at this time.  If he would like to finish AD documents tomorrow, please consult or page the chaplain on-call at [phone].  [address], [place] Pager, [phone]

## 2022-04-12 NOTE — Progress Notes (Addendum)
PT Cancellation Note  Patient Details Name: Jaime Cline MRN: [id number] DOB: Jan 11, 1961   Cancelled Treatment:    Reason Eval/Treat Not Completed: Medical issues which prohibited therapy; pt agreeable to therapy and asked therapist to step out while pt attempted to void in urinal. Upon returning to check on pt's status, pt requesting NRB mask stating "I'm having an attack." VSS, RN notified. Patient was left with nurse in room with O2 via [place] on 3L/min with RT aware.  Coolidge Breeze, PT, DPT [place] [facility] Office: [phone] Weekend pager: [phone] Coolidge Breeze 04/12/2022, 1:17 PM

## 2022-04-12 NOTE — TOC Initial Note (Signed)
Transition of Care [facility]) - Initial/Assessment Note    Patient Details  Name: Jaime Cline MRN: [id number] Date of Birth: [birth year]  Transition of Care [facility]) CM/SW Contact:    Roseanne Kaufman, RN Phone Number: 04/12/2022, 4:07 PM  Clinical Narrative:     Jackquline [place] consult for PCP, HHC, medication assistance, rolling walker. Spoke with patient at bedside. Patietn reports he wasn't sure if he has [place] Medicaid, advised [facility] records indicate that he has insurance. Patient is not eligible for [facility] program for medication assistance. Patient reports he has already received his walker, TOC has not coordinated DME supplies.  Transportation: This RNCM explained to patient that he has transportation to MD appointments with his Medicaid coverage. Patient reports he will need transportation at discharge.   TOC will follow for discharge needs.                   Barriers to Discharge: Continued Medical Work up   Patient Goals and CMS Choice        Expected Discharge Plan and Services   In-house Referral: NA Discharge Planning Services: CM Consult Post Acute Care Choice: Durable Medical Equipment (RW with seat) Living arrangements for the past 2 months: Single Family Home                 DME Arranged: Walker rolling                    Prior Living Arrangements/Services Living arrangements for the past 2 months: Single Family Home Lives with:: Relatives Patient language and need for interpreter reviewed:: Yes Do you feel safe going back to the place where you live?: Yes      Need for Family Participation in Patient Care: Yes (Comment) Care giver support system in place?: Yes (comment) Current home services: DME (cane) Criminal Activity/Legal Involvement Pertinent to Current Situation/Hospitalization: No - Comment as needed  Activities of Daily Living Home Assistive Devices/Equipment: Nebulizer, Eyeglasses, Dentures (specify type) (reading glasses) ADL Screening (condition at  time of admission) Patient's cognitive ability adequate to safely complete daily activities?: Yes Is the patient deaf or have difficulty hearing?: No Does the patient have difficulty seeing, even when wearing glasses/contacts?: Yes Does the patient have difficulty concentrating, remembering, or making decisions?: Yes Patient able to express need for assistance with ADLs?: Yes Does the patient have difficulty dressing or bathing?: Yes Independently performs ADLs?: No Communication: Independent Dressing (OT): Needs assistance Is this a change from baseline?: Pre-admission baseline Grooming: Independent Feeding: Independent Bathing: Needs assistance Is this a change from baseline?: Pre-admission baseline Toileting: Needs assistance Is this a change from baseline?: Pre-admission baseline In/Out Bed: Needs assistance Is this a change from baseline?: Pre-admission baseline Walks in Home: Needs assistance Is this a change from baseline?: Pre-admission baseline Does the patient have difficulty walking or climbing stairs?: Yes Weakness of Legs: Both Weakness of Arms/Hands: Both  Permission Sought/Granted Permission sought to share information with : Case Manager Permission granted to share information with : Yes, Verbal Permission Granted  Share Information with NAME: Case manager           Emotional Assessment Appearance:: Appears stated age Attitude/Demeanor/Rapport: Engaged Affect (typically observed): Accepting Orientation: : Oriented to Place, Oriented to Self, Oriented to  Time, Oriented to Situation Alcohol / Substance Use: Alcohol Use, Tobacco Use Psych Involvement: No (comment)  Admission diagnosis:  COPD exacerbation ([place]) [J44.1] COPD with acute exacerbation ([place]) [J44.1] Patient Active Problem List   Diagnosis Date  Noted   Palliative care encounter 04/11/2022   Goals of care, counseling/discussion 04/11/2022   Counseling and coordination of care 04/11/2022   COPD  with acute exacerbation ([place]) 04/10/2022   Syncope 09/28/2021   Protein-calorie malnutrition, severe 09/27/2021   Hyponatremia    Hypotension due to hypovolemia    Syncope and collapse 09/26/2021   Community acquired pneumonia of right lower lobe of lung    COPD exacerbation ([place]) 08/27/2021   Asthma, chronic 12/31/2012   Smoker 12/31/2012   Knee pain, bilateral 12/31/2012   Iron deficiency anemia 12/31/2012   Knee pain, chronic 12/31/2012   PCP:  Pcp, No Pharmacy:   [facility], [place] [address] Phone: [phone] Fax: [phone]  [id number] [address], [place] [address] [place] [address] Phone: [phone] Fax: [phone]  [id number] [address], [place] [address] Phone: [phone] Fax: [phone]  [facility] [address] [place] [address] Phone: [phone] Fax: [phone]     Social Determinants of Health (SDOH) Interventions    Readmission Risk Interventions    04/12/2022    4:02 PM 01/27/2022   12:27 PM  Readmission Risk Prevention Plan  Transportation Screening Complete Complete  PCP or Specialist Appt within 5-7 Days Complete Not Complete  Not Complete comments  new PCP made for 8/28  Home Care Screening Complete Complete  Medication Review (RN CM) Complete Complete

## 2022-04-12 NOTE — Progress Notes (Signed)
OT Cancellation Note  Patient Details Name: Jaime Cline MRN: [id number] DOB: 04-22-1961   Cancelled Treatment:    Reason Eval/Treat Not Completed: Medical issues which prohibited therapy Patient agreed to therapy at start of session. Patient requesting for non-rebreather mask with reports of "having an attack". Nurse called into room with vitals stable at this time. Patient was left with nurse in room with non-rebreather on 2L/min.  Rennie Plowman, MS Acute Rehabilitation Department Office# [phone]  04/12/2022, 11:46 AM

## 2022-04-12 NOTE — Progress Notes (Signed)
Daily Progress Note   Patient Name: Jaime Cline       Date: 04/12/2022 DOB: Aug 24, 1960  Age: 61 y.o. MRN#: [id number] Attending Physician: Dorcas Carrow, MD Primary Care Physician: Pcp, No Admit Date: 04/10/2022  Reason for Consultation/Follow-up:  Establishing goals of care  Patient Profile/HPI: Patient is a 61 year old male with a past medical history of COPD with ongoing tobacco use, hypertension, and recurrent hospitalization who was admitted on 04/10/2022 for managing of worsening shortness of breath for the past week.  Patient was found to be in respiratory distress in the emergency room with tripoding; required BiPAP administration.  Patient admitted to the hospital for management of COPD exacerbation with severe symptoms.  Palliative care consulted to assist with complex medical decision making.   Yesterday, after conversations, patient's CODE STATUS was updated to DNR/DNI. Chaplain visited with patient today and patient desires to meet with chaplain tomorrow to complete HCPOA documents. PT and OT visited today and patient unable to tolerate due to his respiratory status.  Presented to bedside for visit. Patient observed alert and smiling. When asked how he is doing, patient states that he "had an attack" earlier today. When asked if this was anxiety or breathing - patient stated "both." Reports that the medication he received helped. Dyspnea noted with this conversation. NRB mask in place. Accessory muscle use noted. Inspiratory and expiratory wheezing noted throughout all lung fields, anteriorly and posteriorly. Despite labored breathing, patient is pleasant and engaging. He shares his years of working in [employer], his favorite foods, and humor over recent meals he has eaten here  in the hospital. Emotional support provided through presence and active listening. Patient expresses appreciation for the care that he is receiving at [facility] and for PMT involvement.  Subjective: Review of Systems  Constitutional:  Positive for malaise/fatigue.  Respiratory:  Positive for shortness of breath and wheezing.    Physical Exam Constitutional:      Appearance: He is ill-appearing.     Comments: Cachectic.  Cardiovascular:     Rate and Rhythm: Normal rate.     Heart sounds: Normal heart sounds.  Pulmonary:     Breath sounds: Wheezing present.     Comments: Accessory muscle use. Abdominal:     General: Bowel sounds are normal.     Palpations:  Abdomen is soft.  Genitourinary:    Comments: Clear, amber urine present at bedside in urinal. Musculoskeletal:        General: Normal range of motion.  Neurological:     Mental Status: He is alert.     Comments: Oriented x 4.  Psychiatric:        Mood and Affect: Mood normal.            Vital Signs: BP (!) 129/99   Pulse 88   Temp 97.6 F (36.4 C) (Axillary)   Resp (!) 21   Ht 5\' 10"  (1.778 m)   Wt 41.1 kg   SpO2 100%   BMI 13.00 kg/m  SpO2: SpO2: 100 % O2 Device: O2 Device: Partial Rebreather Mask (placed by rn for pts comfort) O2 Flow Rate: O2 Flow Rate (L/min): 2 L/min  Intake/output summary:  Intake/Output Summary (Last 24 hours) at 04/12/2022 1501 Last data filed at 04/12/2022 1119 Gross per 24 hour  Intake --  Output 975 ml  Net -975 ml   LBM: Last BM Date : 04/11/22 Baseline Weight: Weight: 41.1 kg Most recent weight: Weight: 41.1 kg       Palliative Assessment/Data: 30%      Patient Active Problem List   Diagnosis Date Noted   Palliative care encounter 04/11/2022   Goals of care, counseling/discussion 04/11/2022   Counseling and coordination of care 04/11/2022   COPD with acute exacerbation (HCC) 04/10/2022   Syncope 09/28/2021   Protein-calorie malnutrition, severe 09/27/2021    Hyponatremia    Hypotension due to hypovolemia    Syncope and collapse 09/26/2021   Community acquired pneumonia of right lower lobe of lung    COPD exacerbation ([facility]) 08/27/2021   Asthma, chronic 12/31/2012   Smoker 12/31/2012   Knee pain, bilateral 12/31/2012   Iron deficiency anemia 12/31/2012   Knee pain, chronic 12/31/2012    Palliative Care Assessment & Plan   Patient is a 61 year old male with a past medical history of COPD with ongoing tobacco use, hypertension, and recurrent hospitalization who was admitted on 04/10/2022 for managing of worsening shortness of breath for the past week.  Patient was found to be in respiratory distress in the emergency room with tripoding; required BiPAP administration.  Assessment/Recommendations/Plan  Continue to monitor and use PRN Morphine usage and make adjustments as needed to maintain respiratory comfort. Continue medications including: inhaler, Guaifenesin, and steroid to maintain respiratory comfort. Plan for chaplain to meet with patient 04/13/22 to complete HCPOA paperwork as desired by patient. PMT will continue to follow for symptom management needs and coordination of care.Attempting to determine where patient can receive care moving forward in his debilitated state. Appreciate TOC's assistance with this.   Code Status: DNR  Prognosis:  Weeks to months (less than 6 months)  Discharge Planning: To Be Determined  Care plan was discussed with patient.  Thank you for allowing the Palliative Medicine Team to assist in the care of this patient.   04/15/22, RN MSN [facility] / Nursing Student Palliative Medicine   Please contact Palliative Medicine Team phone at [phone] for questions and concerns.   I have reviewed, seen, examined, and discussed the care of this patient in detail with the NP student [phone] including pertinent patient records, physical exam findings, and data. I have updated the note accordingly  and agree with details of the encounter stated in above note(GC).   Katy Apo, DO Palliative Care Provider [phone]  This provider spent a total of 36  minutes providing patient's care.  Includes review of EMR, discussing care with other staff members involved in patient's medical care, obtaining relevant history and information from patient and/or patient's family, and personal review of imaging and lab work. Greater than 50% of the time was spent counseling and coordinating care related to the above assessment and plan.

## 2022-04-13 DIAGNOSIS — J441 Chronic obstructive pulmonary disease with (acute) exacerbation: Secondary | ICD-10-CM | POA: Diagnosis not present

## 2022-04-13 LAB — GLUCOSE, CAPILLARY
Glucose-Capillary: 118 mg/dL — ABNORMAL HIGH (ref 70–99)
Glucose-Capillary: 130 mg/dL — ABNORMAL HIGH (ref 70–99)
Glucose-Capillary: 176 mg/dL — ABNORMAL HIGH (ref 70–99)
Glucose-Capillary: 161 mg/dL — ABNORMAL HIGH (ref 70–99)

## 2022-04-13 MED ORDER — MELATONIN 3 MG PO TABS
3.0000 mg | ORAL_TABLET | Freq: Once | ORAL | Status: AC
  Administered 2022-04-13: 3 mg via ORAL
  Filled 2022-04-13: qty 1

## 2022-04-13 MED ORDER — MORPHINE SULFATE (CONCENTRATE) 10 MG/0.5ML PO SOLN
5.0000 mg | ORAL | Status: DC | PRN
  Administered 2022-04-13 – 2022-04-18 (×8): 5 mg via ORAL
  Filled 2022-04-13 (×8): qty 0.5

## 2022-04-13 NOTE — Evaluation (Addendum)
Physical Therapy Evaluation Patient Details Name: MATYAS BAISLEY MRN: [id number] DOB: 1961-01-04 Today's Date: 04/13/2022  History of Present Illness  Pt is a 61yo male presenting to [facility] ED on 10/23 secondary to SOB and dry cough; required BiPAP. PMH: COPD, tobacco abuse, asthma, bronchitis, chronic back pain, chronic knee pain, HTN.  Clinical Impression  The patient admitted for above medical  problems. Patient resting in bed on Partial NRB, SPO2 95%. Placed patient on 2 LPM [place] to ambulate x 30'.  SPO2 > 95%.  Ambulated x 30' on RA , SPo2 87%. Patient requesting NRB mask(10LPM).  SPo2  back to 100%.  Pt admitted with above diagnosis.  Pt currently with functional limitations due to the deficits listed below (see PT Problem List). Pt will benefit from skilled PT to increase their independence and safety with mobility to allow discharge to the venue listed below.          Recommendations for follow up therapy are one component of a multi-disciplinary discharge planning process, led by the attending physician.  Recommendations may be updated based on patient status, additional functional criteria and insurance authorization.  Follow Up Recommendations Skilled nursing-short term rehab (<3 hours/day) Can patient physically be transported by private vehicle: Yes    Assistance Recommended at Discharge Set up Supervision/Assistance  Patient can return home with the following  A little help with walking and/or transfers;A little help with bathing/dressing/bathroom;Help with stairs or ramp for entrance;Assistance with cooking/housework;Assist for transportation    Equipment Recommendations Rolling walker (2 wheels)  Recommendations for Other Services       Functional Status Assessment Patient has had a recent decline in their functional status and demonstrates the ability to make significant improvements in function in a reasonable and predictable amount of time.     Precautions / Restrictions         Mobility  Bed Mobility Overal bed mobility: Needs Assistance Bed Mobility: Supine to Sit     Supine to sit: Supervision     General bed mobility comments: use of bed rail    Transfers Overall transfer level: Needs assistance Equipment used: Rolling walker (2 wheels) Transfers: Sit to/from Stand Sit to Stand: Min guard           General transfer comment: line managment, vc for safe hand placement    Ambulation/Gait Ambulation/Gait assistance: Min assist, +2 safety/equipment Gait Distance (Feet): 30 Feet (x 2) Assistive device: Rolling walker (2 wheels) Gait Pattern/deviations: Step-through pattern Gait velocity: decr     General Gait Details: gait steady with RW, encouraged slow breaths  Stairs            Wheelchair Mobility    Modified Rankin (Stroke Patients Only)       Balance Overall balance assessment: Mild deficits observed, not formally tested                                           Pertinent Vitals/Pain Pain Assessment Pain Assessment: No/denies pain    Home Living                          Prior Function                       Hand Dominance        Extremity/Trunk Assessment   Upper Extremity  Assessment Upper Extremity Assessment: Generalized weakness    Lower Extremity Assessment Lower Extremity Assessment: Generalized weakness    Cervical / Trunk Assessment Cervical / Trunk Assessment: Normal  Communication      Cognition Arousal/Alertness: Awake/alert Behavior During Therapy: Anxious Overall Cognitive Status: No family/caregiver present to determine baseline cognitive functioning Area of Impairment: Safety/judgement, Awareness, Problem solving, Attention                   Current Attention Level: Sustained     Safety/Judgement: Decreased awareness of safety, Decreased awareness of deficits Awareness: Emergent Problem Solving: Requires verbal cues General  Comments: Pt tangential in conversation, moves very quickly, pulls up on RW, anxious when he states that he feels SOB and asks for NRB mask        General Comments      Exercises     Assessment/Plan    PT Assessment Patient needs continued PT services  PT Problem List Decreased safety awareness;Decreased activity tolerance;Cardiopulmonary status limiting activity       PT Treatment Interventions DME instruction;Therapeutic activities;Gait training;Therapeutic exercise;Patient/family education;Functional mobility training    PT Goals (Current goals can be found in the Care Plan section)  Acute Rehab PT Goals Patient Stated Goal: go to rehab PT Goal Formulation: With patient Time For Goal Achievement: 04/27/22 Potential to Achieve Goals: Fair    Frequency Min 3X/week     Co-evaluation               AM-PAC PT "6 Clicks" Mobility  Outcome Measure Help needed turning from your back to your side while in a flat bed without using bedrails?: A Little Help needed moving from lying on your back to sitting on the side of a flat bed without using bedrails?: None Help needed moving to and from a bed to a chair (including a wheelchair)?: A Little Help needed standing up from a chair using your arms (e.g., wheelchair or bedside chair)?: A Little Help needed to walk in hospital room?: A Little Help needed climbing 3-5 steps with a railing? : A Lot 6 Click Score: 18    End of Session Equipment Utilized During Treatment: Oxygen Activity Tolerance: Patient tolerated treatment well Patient left: in chair;with call bell/phone within reach;with chair alarm set Nurse Communication: Mobility status PT Visit Diagnosis: Unsteadiness on feet (R26.81);Difficulty in walking, not elsewhere classified (R26.2)    Time: 1020-1050 PT Time Calculation (min) (ACUTE ONLY): 30 min   Charges:   PT Evaluation $PT Eval Low Complexity: 1 Low          [place] Office [phone] Weekend [id number]   Claretha Cooper 04/13/2022, 3:21 PM

## 2022-04-13 NOTE — Progress Notes (Signed)
Follow up visit today with patient for HPOA, document was not completed. I left patient with a pen to follow through with filling out the document, and asked that he have staff to page chaplain when he is ready to have it notarized.

## 2022-04-13 NOTE — Progress Notes (Signed)
Daily Progress Note   Patient Name: Jaime Cline       Date: 04/13/2022 DOB: [birth year]  Age: 61 y.o. MRN#: [id number] Attending Physician: Barb Merino, MD Primary Care Physician: Pcp, No Admit Date: 04/10/2022  Reason for Consultation/Follow-up:  Establishing goals of care  Patient Profile/HPI: Patient is a 61 year old male with a past medical history of COPD with ongoing tobacco use, hypertension, and recurrent hospitalization who was admitted on 04/10/2022 for managing of worsening shortness of breath for the past week.  Patient was found to be in respiratory distress in the emergency room with tripoding; required BiPAP administration.  Patient admitted to the hospital for management of COPD exacerbation with severe symptoms.  Palliative care consulted to assist with complex medical decision making.   Presented to bedside today and patient sitting up in chair comfortably eating lunch.  Noted feeling today was a better day than yesterday.  Patient noted he was able to work with PT/OT and that the plan is for him to go to rehab.  Encouraged him working as able if that brings him quality of life and makes him comfortable.  Patient would like to be able to move around a bit more so agree that this gives him quality of life.  Did discuss the rehab is only a short term solution and that he will still likely need to work with case managers moving forward including at the facility about long-term care support, especially with hospice.  Discussed again hospice supports management of symptoms at the end of life.  Again patient wanted to discuss severity of his lung disease and so answered questions regarding it at this time.  Patient noted how use of morphine did improve his symptoms so encouraged  continuation of that and to let providers know if his symptoms are not well managed.  All questions answered at that time.  Thank patient for allowing this provider to visit today.  Subjective: Review of Systems  Respiratory:  Negative for cough.   Cardiovascular:  Negative for chest pain.  Neurological:  Positive for weakness.   Physical Exam Constitutional:      Comments: Cachectic, frail, muscle wasting present  Cardiovascular:     Rate and Rhythm: Normal rate.     Heart sounds: Normal heart sounds.  Pulmonary:     Comments: Decreased  breath sounds throughout all fields Abdominal:     General: Bowel sounds are normal.     Palpations: Abdomen is soft.  Genitourinary:    Comments: Clear, amber urine present at bedside in urinal. Musculoskeletal:        General: Normal range of motion.  Neurological:     Mental Status: He is alert.     Comments: Oriented x 4.  Psychiatric:        Mood and Affect: Mood normal.            Vital Signs: BP 117/84   Pulse (!) 102   Temp 97.7 F (36.5 C) (Oral)   Resp 20   Ht 5\' 10"  (1.778 m)   Wt 41.1 kg   SpO2 100%   BMI 13.00 kg/m  SpO2: SpO2: 100 % O2 Device: O2 Device: Room Air O2 Flow Rate: O2 Flow Rate (L/min): 10 L/min  Intake/output summary:  Intake/Output Summary (Last 24 hours) at 04/13/2022 1020 Last data filed at 04/13/2022 [id number] Gross per 24 hour  Intake 240 ml  Output 2200 ml  Net -1960 ml    LBM: Last BM Date : 04/11/22 Baseline Weight: Weight: 41.1 kg Most recent weight: Weight: 41.1 kg       Palliative Assessment/Data: 40%      Patient Active Problem List   Diagnosis Date Noted   Dyspnea 04/12/2022   Palliative care encounter 04/11/2022   Goals of care, counseling/discussion 04/11/2022   Counseling and coordination of care 04/11/2022   COPD with acute exacerbation (HCC) 04/10/2022   Syncope 09/28/2021   Protein-calorie malnutrition, severe 09/27/2021   Hyponatremia    Hypotension due to hypovolemia     Syncope and collapse 09/26/2021   Community acquired pneumonia of right lower lobe of lung    COPD exacerbation ([place]) 08/27/2021   Asthma, chronic 12/31/2012   Smoker 12/31/2012   Knee pain, bilateral 12/31/2012   Iron deficiency anemia 12/31/2012   Knee pain, chronic 12/31/2012    Palliative Care Assessment & Plan   Patient is a 61 year old male with a past medical history of COPD with ongoing tobacco use, hypertension, and recurrent hospitalization who was admitted on 04/10/2022 for managing of worsening shortness of breath for the past week.  Patient was found to be in respiratory distress in the emergency room with tripoding; required BiPAP on administration.  Assessment/Recommendations/Plan  Hospitalist already adjusted PRN Morphine to 5 mg every 2 hours as needed pain or shortness of breath encourage patient to use with his symptom burden.  Continue medications including: inhaler, Guaifenesin, and steroid to maintain respiratory comfort. Please inform chaplain when patient ready to complete HCPOA paperwork as patient wants his brother and sister to be his medical decision makers, not his son, so this paperwork will be beneficial to have in the state of [place] stating his wishes.  Code Status: DNR  Prognosis:  Weeks to months (less than 6 months)  Discharge Planning: Rehab then eventual plan is hope for transition to long-term care with hospice support. Appreciate case managers assistance with coordination.  Care plan was discussed with patient.  Thank you for involving palliative care team and patient's care.  Palliative care team will continue to follow along.  Chelsea Aus, DO Palliative Care Provider [phone]  This provider spent a total of 35 minutes providing patient's care.  Includes review of EMR, discussing care with other staff members involved in patient's medical care, obtaining relevant history and information from patient and/or patient's family,  and  personal review of imaging and lab work. Greater than 50% of the time was spent counseling and coordinating care related to the above assessment and plan.

## 2022-04-13 NOTE — Progress Notes (Signed)
PROGRESS NOTE    Jaime Cline  IPJ:[id number] DOB: Oct 26, 1960 DOA: 04/10/2022 PCP: Pcp, No    Brief Narrative:  Severe COPD and recurrent hospitalization presented with a 1 week of worsening shortness of breath.  Found to have respiratory distress in the emergency room and started on BiPAP and admitted to stepdown unit. Remains with very advanced COPD, frequent exacerbations.   Assessment & Plan:   COPD exacerbation, respiratory distress and acute hypoxemic respiratory failure: Presented with severe respiratory distress needing BiPAP support, hypoxemic, pH 7.2.  Improved with BiPAP use. Continue aggressive bronchodilator therapy, continue IV steroids today.  Will treat with long-term steroid therapy. inhalational steroids, deep breathing exercises, incentive spirometry, chest physiotherapy and respiratory therapy consult. Doxycycline for 3/5 days. Supplemental oxygen to keep saturations more than 92%. BiPAP as needed.  Not used for the last 24 hours. Followed by palliative care.  Appreciate help.  Agreed for long-term palliative and hospice follow-up. Gets frequent episodes of chest tightness and bronchospasm, he did have some relief with IV morphine, will add morphine concentrated solution 5 mg as needed for shortness of breath to help relieve pain and anxiety.  Will prescribe on discharge.  Severe protein calorie malnutrition and cachexia: BMI 13.  Nutrition following.  Palliative care as above.  Smoker: Counseled to quit.  He is ready to quit.  Nicotine patch.  Work with PT OT today.  Referred to a skilled nursing facility with past follow-up and ultimate hospice follow-up.   DVT prophylaxis: enoxaparin (LOVENOX) injection 30 mg Start: 04/11/22 1000   Code Status: DNR/DNI Family Communication: None at the bedside.   Disposition Plan: Status is: Inpatient Remains inpatient appropriate because: Severe COPD.  Needs SNF.     Consultants:  Palliative care  Procedures:   None  Antimicrobials:  Doxycycline 10/23---   Subjective: Patient seen and examined.  He tells me he had a good night.  He did not have another episode after yesterday evening's episode.  Today feeling fairly well and able to take out oxygen to eat.  Objective: Vitals:   04/13/22 0400 04/13/22 0746 04/13/22 0755 04/13/22 0758  BP:      Pulse:      Resp:      Temp: 98.1 F (36.7 C) 97.7 F (36.5 C)    TempSrc: Oral Oral    SpO2:   99% 100%  Weight:      Height:        Intake/Output Summary (Last 24 hours) at 04/13/2022 1034 Last data filed at 04/13/2022 0739 Gross per 24 hour  Intake 240 ml  Output 2200 ml  Net -1960 ml    Filed Weights   04/10/22 2205  Weight: 41.1 kg    Examination:  General exam: Frail and debilitated.  Cachectic.  Currently on room air eating.  Able to talk today but exhausted quickly. Respiratory system: Mostly absent air entry both side. Cardiovascular system: S1 & S2 heard, RRR. No pedal edema.  Cachectic legs. Gastrointestinal system: Abdomen is nondistended, soft and nontender. No organomegaly or masses felt. Normal bowel sounds heard. Central nervous system: Alert and oriented. No focal neurological deficits. Extremities: Symmetric 5 x 5 power. Skin: No rashes, lesions or ulcers    Data Reviewed: I have personally reviewed following labs and imaging studies  CBC: Recent Labs  Lab 04/10/22 1546 04/11/22 0244 04/12/22 0513  WBC 5.1 4.9 8.9  NEUTROABS 3.4  --  7.1  HGB 13.6 11.7* 10.6*  HCT 44.5 35.4* 31.8*  MCV 107.2*  98.9 99.7  PLT 212 269 214    Basic Metabolic Panel: Recent Labs  Lab 04/10/22 1546 04/11/22 0244 04/12/22 0241  NA 135 133*  --   K 3.8 3.9  --   CL 99 97*  --   CO2 24 24  --   GLUCOSE 121* 307*  --   BUN 13 21  --   CREATININE 0.79 1.02  --   CALCIUM 9.2 8.7*  --   MG  --   --  1.9  PHOS  --   --  3.7    GFR: Estimated Creatinine Clearance: 44.2 mL/min (by C-G formula based on SCr of 1.02  mg/dL). Liver Function Tests: Recent Labs  Lab 04/10/22 1546  AST 38  ALT 23  ALKPHOS 64  BILITOT 0.7  PROT 8.0  ALBUMIN 4.5    No results for input(s): "LIPASE", "AMYLASE" in the last 168 hours. No results for input(s): "AMMONIA" in the last 168 hours. Coagulation Profile: No results for input(s): "INR", "PROTIME" in the last 168 hours. Cardiac Enzymes: No results for input(s): "CKTOTAL", "CKMB", "CKMBINDEX", "TROPONINI" in the last 168 hours. BNP (last 3 results) No results for input(s): "PROBNP" in the last 8760 hours. HbA1C: Recent Labs    04/11/22 1120  HGBA1C 5.2    CBG: Recent Labs  Lab 04/12/22 0738 04/12/22 1245 04/12/22 1638 04/12/22 2121 04/13/22 0737  GLUCAP 112* 122* 126* 118* 118*    Lipid Profile: No results for input(s): "CHOL", "HDL", "LDLCALC", "TRIG", "CHOLHDL", "LDLDIRECT" in the last 72 hours. Thyroid Function Tests: No results for input(s): "TSH", "T4TOTAL", "FREET4", "T3FREE", "THYROIDAB" in the last 72 hours. Anemia Panel: No results for input(s): "VITAMINB12", "FOLATE", "FERRITIN", "TIBC", "IRON", "RETICCTPCT" in the last 72 hours. Sepsis Labs: No results for input(s): "PROCALCITON", "LATICACIDVEN" in the last 168 hours.  Recent Results (from the past 240 hour(s))  Resp Panel by RT-PCR (Flu A&B, Covid) Anterior Nasal Swab     Status: None   Collection Time: 04/10/22  3:46 PM   Specimen: Anterior Nasal Swab  Result Value Ref Range Status   SARS Coronavirus 2 by RT PCR NEGATIVE NEGATIVE Final    Comment: (NOTE) SARS-CoV-2 target nucleic acids are NOT DETECTED.  The SARS-CoV-2 RNA is generally detectable in upper respiratory specimens during the acute phase of infection. The lowest concentration of SARS-CoV-2 viral copies this assay can detect is 138 copies/mL. A negative result does not preclude SARS-Cov-2 infection and should not be used as the sole basis for treatment or other patient management decisions. A negative result may  occur with  improper specimen collection/handling, submission of specimen other than nasopharyngeal swab, presence of viral mutation(s) within the areas targeted by this assay, and inadequate number of viral copies(<138 copies/mL). A negative result must be combined with clinical observations, patient history, and epidemiological information. The expected result is Negative.  Fact Sheet for Patients:  BloggerCourse.com  Fact Sheet for Healthcare Providers:  SeriousBroker.it  This test is no t yet approved or cleared by the [place] FDA and  has been authorized for detection and/or diagnosis of SARS-CoV-2 by FDA under an Emergency Use Authorization (EUA). This EUA will remain  in effect (meaning this test can be used) for the duration of the COVID-19 declaration under Section 564(b)(1) of the Act, 21 U.S.C.section 360bbb-3(b)(1), unless the authorization is terminated  or revoked sooner.       Influenza A by PCR NEGATIVE NEGATIVE Final   Influenza B by PCR NEGATIVE NEGATIVE Final  Comment: (NOTE) The Xpert Xpress SARS-CoV-2/FLU/RSV plus assay is intended as an aid in the diagnosis of influenza from Nasopharyngeal swab specimens and should not be used as a sole basis for treatment. Nasal washings and aspirates are unacceptable for Xpert Xpress SARS-CoV-2/FLU/RSV testing.  Fact Sheet for Patients: BloggerCourse.com  Fact Sheet for Healthcare Providers: SeriousBroker.it  This test is not yet approved or cleared by the [place] FDA and has been authorized for detection and/or diagnosis of SARS-CoV-2 by FDA under an Emergency Use Authorization (EUA). This EUA will remain in effect (meaning this test can be used) for the duration of the COVID-19 declaration under Section 564(b)(1) of the Act, 21 U.S.C. section 360bbb-3(b)(1), unless the authorization is terminated  or revoked.  Performed at [facility], [address] [place], [place] [address]   MRSA Next Gen by PCR, Nasal     Status: None   Collection Time: 04/10/22  9:42 PM   Specimen: Nasal Mucosa; Nasal Swab  Result Value Ref Range Status   MRSA by PCR Next Gen NOT DETECTED NOT DETECTED Final    Comment: (NOTE) The GeneXpert MRSA Assay (FDA approved for NASAL specimens only), is one component of a comprehensive MRSA colonization surveillance program. It is not intended to diagnose MRSA infection nor to guide or monitor treatment for MRSA infections. Test performance is not FDA approved in patients less than 11 years old. Performed at [facility], [address] [place], [place] [address]   Expectorated Sputum Assessment w Gram Stain, Rflx to Resp Cult     Status: None   Collection Time: 04/11/22  5:02 PM   Specimen: Expectorated Sputum  Result Value Ref Range Status   Specimen Description EXPECTORATED SPUTUM  Final   Special Requests NONE  Final   Sputum evaluation   Final    Sputum specimen not acceptable for testing.  Please recollect.   Performed at [facility], [address], [place], [place] [address]    Report Status 04/11/2022 FINAL  Final  Expectorated Sputum Assessment w Gram Stain, Rflx to Resp Cult     Status: None   Collection Time: 04/11/22  8:11 PM   Specimen: Expectorated Sputum  Result Value Ref Range Status   Specimen Description EXPECTORATED SPUTUM  Final   Special Requests NONE  Final   Sputum evaluation   Final    THIS SPECIMEN IS ACCEPTABLE FOR SPUTUM CULTURE Performed at [facility], [address] [place], [place] [address]    Report Status 04/12/2022 FINAL  Final  Culture, Respiratory w Gram Stain     Status: None (Preliminary result)   Collection Time: 04/11/22  8:11 PM  Result Value Ref Range Status   Specimen Description   Final    EXPECTORATED SPUTUM Performed at  [facility], [address] [place], [place] [address]    Special Requests   Final    NONE Reflexed from [id number] Performed at [facility], [address], [place], [place] [address]    Gram Stain   Final    RARE WBC PRESENT, PREDOMINANTLY PMN FEW GRAM POSITIVE COCCI IN PAIRS IN CHAINS RARE GRAM NEGATIVE RODS    Culture   Final    CULTURE REINCUBATED FOR BETTER GROWTH Performed at [facility], [address], [place], [place] [address]    Report Status PENDING  Incomplete         Radiology Studies: No results found.  Scheduled Meds:  atorvastatin  20 mg Oral Daily   Chlorhexidine Gluconate Cloth  6 each Topical Daily   doxycycline  100 mg Oral Q12H   enoxaparin (LOVENOX) injection  30 mg Subcutaneous Q24H   famotidine  20 mg Oral BID   feeding supplement  237 mL Oral TID BM   insulin aspart  0-5 Units Subcutaneous QHS   insulin aspart  0-9 Units Subcutaneous TID WC   ipratropium-albuterol  3 mL Nebulization TID   methylPREDNISolone (SOLU-MEDROL) injection  40 mg Intravenous Q12H   mometasone-formoterol  2 puff Inhalation BID   nicotine  14 mg Transdermal Daily   sertraline  50 mg Oral Daily   Continuous Infusions:   LOS: 2 days    Time spent: 35 minutes    Dorcas Carrow, MD [facility] Pager [phone]

## 2022-04-13 NOTE — Evaluation (Signed)
Occupational Therapy Evaluation Patient Details Name: Jaime Cline MRN: [id number] DOB: 1960/10/14 Today's Date: 04/13/2022   History of Present Illness Pt is a 61yo male presenting to [facility] ED on 10/23 secondary to SOB and dry cough; required BiPAP. PMH: COPD, tobacco abuse, asthma, bronchitis, chronic back pain, chronic knee pain, HTN.   Clinical Impression   Pt typically lives in apt with sister-in-law who helps him set up bathing/dressing and with IADL. He typically moves with a Rollator. Today he presents with deficits in cognition and safety awareness, activity tolerance, balance, strength, and ability to perform ADL overall min A for UB and mod A for LB (although he can reach LB via figure 4 method). He requires frequent rest breaks due to breathing. SpO2 was 97% at rest on RA, drops to 87% with activity on RA, returned to 96% and higher on 2L via [place]. Pt currently preferring NRB (which requires 10L for safety). SpO2 on NRB is 100%. Pt will benefit from skilled OT in the acute setting as well as afterwards at the SNF level to maximize safety and independence in ADL and functional transfers. Next session to take energy conservation handout as well as continue OOB activity tolerance and standing balance for ADL.       Recommendations for follow up therapy are one component of a multi-disciplinary discharge planning process, led by the attending physician.  Recommendations may be updated based on patient status, additional functional criteria and insurance authorization.   Follow Up Recommendations  Skilled nursing-short term rehab (<3 hours/day)    Assistance Recommended at Discharge Intermittent Supervision/Assistance  Patient can return home with the following A little help with walking and/or transfers;A little help with bathing/dressing/bathroom;Assistance with cooking/housework;Direct supervision/assist for medications management;Direct supervision/assist for financial management;Assist for  transportation;Help with stairs or ramp for entrance    Functional Status Assessment  Patient has had a recent decline in their functional status and demonstrates the ability to make significant improvements in function in a reasonable and predictable amount of time.  Equipment Recommendations  None recommended by OT (Pt has appropriate DME)    Recommendations for Other Services PT consult     Precautions / Restrictions Restrictions Weight Bearing Restrictions: No      Mobility Bed Mobility Overal bed mobility: Needs Assistance Bed Mobility: Supine to Sit     Supine to sit: Supervision     General bed mobility comments: use of bed rai    Transfers Overall transfer level: Needs assistance Equipment used: Rolling walker (2 wheels) Transfers: Sit to/from Stand Sit to Stand: Min guard           General transfer comment: line managment, vc for safe hand placement      Balance Overall balance assessment: Mild deficits observed, not formally tested                                         ADL either performed or assessed with clinical judgement   ADL Overall ADL's : Needs assistance/impaired Eating/Feeding: Modified independent   Grooming: Wash/dry hands;Wash/dry face;Set up;Sitting Grooming Details (indicate cue type and reason): decreased activity tolerance for standing grooming tasks, dependent on BUE in standing for balance Upper Body Bathing: Minimal assistance   Lower Body Bathing: Moderate assistance   Upper Body Dressing : Set up   Lower Body Dressing: Min guard;Bed level Lower Body Dressing Details (indicate cue type and reason):  long sitting to don socks Toilet Transfer: Minimal assistance;+2 for safety/equipment;Rolling walker (2 wheels) Toilet Transfer Details (indicate cue type and reason): very short distances Toileting- Architect and Hygiene: Min guard;Sit to/from stand       Functional mobility during ADLs: Min  guard;Minimal assistance;+2 for safety/equipment;Rolling walker (2 wheels) General ADL Comments: decreased activity tolerance, impacted by cardiopulmonary status     Vision Patient Visual Report: No change from baseline       Perception     Praxis      Pertinent Vitals/Pain Pain Assessment Pain Assessment: No/denies pain     Hand Dominance Right   Extremity/Trunk Assessment Upper Extremity Assessment Upper Extremity Assessment: Generalized weakness   Lower Extremity Assessment Lower Extremity Assessment: Defer to PT evaluation   Cervical / Trunk Assessment Cervical / Trunk Assessment: Normal (during breathing prefers to assume a tripod position)   Communication Communication Communication: No difficulties (does get SOB with too much talking)   Cognition Arousal/Alertness: Awake/alert Behavior During Therapy: Impulsive Overall Cognitive Status: No family/caregiver present to determine baseline cognitive functioning Area of Impairment: Safety/judgement, Awareness, Problem solving, Attention                   Current Attention Level: Sustained     Safety/Judgement: Decreased awareness of safety, Decreased awareness of deficits Awareness: Emergent Problem Solving: Requires verbal cues General Comments: Pt tangential in conversation, moves very quickly, pulls up on RW,     General Comments  87% on RA, Pt maintaining 96% and higher on 2L via [place], Pt prefers NRB mask    Exercises     Shoulder Instructions      Home Living Family/patient expects to be discharged to:: Private residence Living Arrangements: Other relatives (sister in law) Available Help at Discharge: Family;Available PRN/intermittently Type of Home: Apartment Home Access: Stairs to enter Entrance Stairs-Number of Steps: 7 Entrance Stairs-Rails: Right;Left Home Layout: One level     Bathroom Shower/Tub: Chief Strategy Officer: Standard     Home Equipment: Rollator (4  wheels);Other (comment) (O2)   Additional Comments: 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 Pt's sisterinlaw also cooks and cleans        OT Problem List: Decreased activity tolerance;Impaired balance (sitting and/or standing);Decreased safety awareness;Cardiopulmonary status limiting activity      OT Treatment/Interventions: Therapeutic exercise;Energy conservation;Therapeutic activities    OT Goals(Current goals can be found in the care plan section) Acute Rehab OT Goals Patient Stated Goal: get to rehab to get stronger OT Goal Formulation: With patient Time For Goal Achievement: 04/27/22 Potential to Achieve Goals: Good ADL Goals Pt Will Perform Grooming: standing;with supervision Pt Will Perform Upper Body Dressing: with modified independence;sitting Pt Will Perform Lower Body Dressing: with supervision;sit to/from stand Pt Will Transfer to Toilet: ambulating;with modified independence Pt Will Perform Toileting - Clothing Manipulation and hygiene: with modified independence;sit to/from stand Additional ADL Goal #1: Pt will verbalize 3 ways of conserving energy during ADL routine with no cues  OT Frequency: Min 2X/week    Co-evaluation  AM-PAC OT "6 Clicks" Daily Activity     Outcome Measure Help from another person eating meals?: None Help from another person taking care of personal grooming?: A Little Help from another person toileting, which includes using toliet, bedpan, or urinal?: A Little Help from another person bathing (including washing, rinsing, drying)?: A  Little Help from another person to put on and taking off regular upper body clothing?: A Little Help from another person to put on and taking off regular lower body clothing?: A Little 6 Click Score: 19   End of Session Equipment Utilized During Treatment: Rolling walker (2 wheels);Oxygen (2-10L (due to NRB)) Nurse Communication: Mobility status  Activity Tolerance: Patient limited by fatigue Patient left: in chair;with call bell/phone within reach;with chair alarm set  OT Visit Diagnosis: Other abnormalities of gait and mobility (R26.89);Muscle weakness (generalized) (M62.81);Other symptoms and signs involving cognitive function                Time: 1006-1046 OT Time Calculation (min): 40 min Charges:  OT General Charges $OT Visit: 1 Visit OT Evaluation $OT Eval Moderate Complexity: 1 Mod  Nyoka Cowden OTR/L Acute Rehabilitation Services Office: [phone]  Emelda Fear 04/13/2022, 12:13 PM

## 2022-04-14 DIAGNOSIS — J441 Chronic obstructive pulmonary disease with (acute) exacerbation: Secondary | ICD-10-CM | POA: Diagnosis not present

## 2022-04-14 LAB — GLUCOSE, CAPILLARY
Glucose-Capillary: 112 mg/dL — ABNORMAL HIGH (ref 70–99)
Glucose-Capillary: 183 mg/dL — ABNORMAL HIGH (ref 70–99)
Glucose-Capillary: 128 mg/dL — ABNORMAL HIGH (ref 70–99)
Glucose-Capillary: 146 mg/dL — ABNORMAL HIGH (ref 70–99)
Glucose-Capillary: 178 mg/dL — ABNORMAL HIGH (ref 70–99)

## 2022-04-14 LAB — CULTURE, RESPIRATORY W GRAM STAIN

## 2022-04-14 MED ORDER — MORPHINE SULFATE (CONCENTRATE) 10 MG/0.5ML PO SOLN: 5.0000 mg | ORAL | 0 refills | Status: DC | PRN

## 2022-04-14 MED ORDER — MELATONIN 3 MG PO TABS
3.0000 mg | ORAL_TABLET | Freq: Every day | ORAL | Status: AC
  Administered 2022-04-14 – 2022-04-15 (×2): 3 mg via ORAL
  Filled 2022-04-14 (×2): qty 1

## 2022-04-14 NOTE — Progress Notes (Signed)
Physical Therapy Treatment Patient Details Name: KAREE CHRISTOPHERSON MRN: [id number] DOB: Sep 28, 1960 Today's Date: 04/14/2022   History of Present Illness Pt is a 61yo male presenting to [facility] ED on 10/23 secondary to SOB and dry cough; required BiPAP for COPD exacerbation. PMH: COPD, tobacco abuse, asthma, bronchitis, chronic back pain, chronic knee pain, HTN.    PT Comments    Pt making gradual progress but does fatigue very easily.  Pt requiring cues for safety as he tends to move quickly and cues for relaxation when he becomes short of breath.  Pt supervision for transfers and ambulation. He did require O2 in order to maintain oxygen saturation with ambulation. Recommend rollator for home to allow for rest breaks.    SATURATION QUALIFICATIONS: (This note is used to comply with regulatory documentation for home oxygen)  Patient Saturations on Room Air at Rest = 97%  Patient Saturations on Room Air while Ambulating = 86%  Patient Saturations on n/a Liters of oxygen while Ambulating = Pt reports fatigue after ambulation on RA, not tested.   Please briefly explain why patient needs home oxygen: In order to maintain oxygen saturation with ambulation.   Recommendations for follow up therapy are one component of a multi-disciplinary discharge planning process, led by the attending physician.  Recommendations may be updated based on patient status, additional functional criteria and insurance authorization.  Follow Up Recommendations  Home health PT Can patient physically be transported by private vehicle: Yes   Assistance Recommended at Discharge Set up Supervision/Assistance  Patient can return home with the following A little help with walking and/or transfers;A little help with bathing/dressing/bathroom;Help with stairs or ramp for entrance;Assistance with cooking/housework;Assist for transportation   Equipment Recommendations  Rollator (4 wheels)    Recommendations for Other Services        Precautions / Restrictions Precautions Precautions: Fall Precaution Comments: watch sats     Mobility  Bed Mobility Overal bed mobility: Needs Assistance Bed Mobility: Supine to Sit, Sit to Supine     Supine to sit: Supervision, HOB elevated Sit to supine: Supervision, HOB elevated        Transfers Overall transfer level: Needs assistance Equipment used: None Transfers: Bed to chair/wheelchair/BSC Sit to Stand: Supervision Stand pivot transfers: Supervision         General transfer comment: supervision for line managment; cues to wait for line management    Ambulation/Gait Ambulation/Gait assistance: Min guard Gait Distance (Feet): 30 Feet (30', 15', 10' , 5') Assistive device: Rolling walker (2 wheels) Gait Pattern/deviations: Step-through pattern Gait velocity: decreased     General Gait Details: Pt requiring multiple standing rest breaks that with decreasing distance each bout.  Cues for relaxation and pursed lip breathing   Stairs             Wheelchair Mobility    Modified Rankin (Stroke Patients Only)       Balance Overall balance assessment: Needs assistance Sitting-balance support: No upper extremity supported Sitting balance-Leahy Scale: Good     Standing balance support: No upper extremity supported, Bilateral upper extremity supported Standing balance-Leahy Scale: Fair Standing balance comment: RW to ambulate for energy conservation but able to stand pivot without AD                            Cognition Arousal/Alertness: Awake/alert Behavior During Therapy: Anxious, Impulsive Overall Cognitive Status: No family/caregiver present to determine baseline cognitive functioning  Current Attention Level: Selective     Safety/Judgement: Decreased awareness of safety, Decreased awareness of deficits Awareness: Emergent Problem Solving: Requires verbal cues General Comments: Pt moves  quickly needing cues for safety.  Pt becomes anxious with activity requiring cues for relaxation        Exercises      General Comments General comments (skin integrity, edema, etc.):   Pt asking about oxygen for at home - tested and sats do decrease with RA with walking (see above), RN notified MD.    Pt also asking about cane and rollator for home.  Advised rollator for energy conservation and ability to take rest breaks. Discussed if also wants cane could obtain at medical supply store, online, or retail stores. Pt verbalizes understanding.      Pertinent Vitals/Pain Pain Assessment Pain Assessment: No/denies pain    Home Living                          Prior Function            PT Goals (current goals can now be found in the care plan section) Progress towards PT goals: Progressing toward goals    Frequency    Min 3X/week      PT Plan Discharge plan needs to be updated;Equipment recommendations need to be updated    Co-evaluation              AM-PAC PT "6 Clicks" Mobility   Outcome Measure  Help needed turning from your back to your side while in a flat bed without using bedrails?: None Help needed moving from lying on your back to sitting on the side of a flat bed without using bedrails?: None Help needed moving to and from a bed to a chair (including a wheelchair)?: A Little Help needed standing up from a chair using your arms (e.g., wheelchair or bedside chair)?: A Little Help needed to walk in hospital room?: A Little Help needed climbing 3-5 steps with a railing? : A Little 6 Click Score: 20    End of Session Equipment Utilized During Treatment: Oxygen Activity Tolerance: Patient tolerated treatment well Patient left: with call bell/phone within reach (sitting EOB for wash up ; RN present) Nurse Communication: Mobility status PT Visit Diagnosis: Unsteadiness on feet (R26.81);Difficulty in walking, not elsewhere classified (R26.2)      Time: 8841-6606 PT Time Calculation (min) (ACUTE ONLY): 20 min  Charges:  $Gait Training: 8-22 mins                     Abran Richard, PT Acute Rehab [facility] [place] 04/14/2022, 4:29 PM

## 2022-04-14 NOTE — Plan of Care (Signed)
Discussed with patient plan of care for the evening, pain management and bedtime medications with some teach back displayed.  Given second urinal and hand sanitizer.  Problem: Education: Goal: Knowledge of General Education information will improve Description: Including pain rating scale, medication(s)/side effects and non-pharmacologic comfort measures Outcome: Progressing   Problem: Health Behavior/Discharge Planning: Goal: Ability to manage health-related needs will improve Outcome: Progressing

## 2022-04-14 NOTE — Progress Notes (Signed)
PROGRESS NOTE    Jaime Cline  WLN:[id number] DOB: 03-21-1961 DOA: 04/10/2022 PCP: Pcp, No    Brief Narrative:  Severe COPD and recurrent hospitalization presented with a 1 week of worsening shortness of breath.  Found to have respiratory distress in the emergency room and started on BiPAP and admitted to stepdown unit. Remains with very advanced COPD, frequent exacerbations.   Assessment & Plan:   COPD exacerbation, respiratory distress and acute hypoxemic respiratory failure: Presented with severe respiratory distress needing BiPAP support, hypoxemic, pH 7.2.  Improved with BiPAP use.  Continue aggressive bronchodilator therapy, continue IV steroids today.  Will change to oral steroids.  Will treat with long-term steroid therapy. inhalational steroids, deep breathing exercises, incentive spirometry, chest physiotherapy and respiratory therapy consult. Doxycycline for 4/5 days. Supplemental oxygen to keep saturations more than 90%. BiPAP as needed.  Not used since admission. Followed by palliative care.  Appreciate help.  Agreed for long-term palliative and hospice follow-up. Gets frequent episodes of chest tightness and bronchospasm, he did have some relief with IV morphine, will add morphine concentrated solution 5 mg as needed for shortness of breath to help relieve pain and anxiety.  Will prescribe on discharge.  Severe protein calorie malnutrition and cachexia: BMI 13.  Nutrition following.  Palliative care as above.  Smoker: Counseled to quit.  He is ready to quit.  Nicotine patch.  Transfer to progressive bed.  Transfer to a skilled nursing facility with palliative care follow-up when available.  DVT prophylaxis: enoxaparin (LOVENOX) injection 30 mg Start: 04/11/22 1000   Code Status: DNR/DNI Family Communication: None at the bedside.   Disposition Plan: Status is: Inpatient Remains inpatient appropriate because: Severe COPD.  Needs SNF.     Consultants:  Palliative  care  Procedures:  None  Antimicrobials:  Doxycycline 10/23---   Subjective: Patient seen and examined.  Looked comfortable eating breakfast.  Morphine did help alleviate his shortness of breath and anxiety and he was very grateful.  He tells me he slept pretty well after long time.  Currently on room air.  He keeps nonrebreather at the side just in case.  Objective: Vitals:   04/14/22 0755 04/14/22 0800 04/14/22 0823 04/14/22 0900  BP:  (!) 143/88 (!) 143/88 (!) 121/97  Pulse:  93 91 96  Resp:  (!) 24 (!) 24 20  Temp: 98.1 F (36.7 C)     TempSrc: Oral     SpO2:  100% 100% 100%  Weight:      Height:        Intake/Output Summary (Last 24 hours) at 04/14/2022 1016 Last data filed at 04/14/2022 0800 Gross per 24 hour  Intake 720 ml  Output 1150 ml  Net -430 ml    Filed Weights   04/10/22 2205  Weight: 41.1 kg    Examination:  General exam: Cachectic.  Sick looking.  Mildly distressed. Respiratory system: Poor air entry both sides.  No added sounds today. Cardiovascular system: S1 & S2 heard, RRR. No pedal edema.  Cachectic legs. Gastrointestinal system: Abdomen is nondistended, soft and nontender. No organomegaly or masses felt. Normal bowel sounds heard. Central nervous system: Alert and oriented. No focal neurological deficits. Extremities: Symmetric 5 x 5 power. Skin: No rashes, lesions or ulcers    Data Reviewed: I have personally reviewed following labs and imaging studies  CBC: Recent Labs  Lab 04/10/22 1546 04/11/22 0244 04/12/22 0513  WBC 5.1 4.9 8.9  NEUTROABS 3.4  --  7.1  HGB 13.6 11.7*  10.6*  HCT 44.5 35.4* 31.8*  MCV 107.2* 98.9 99.7  PLT 212 269 214    Basic Metabolic Panel: Recent Labs  Lab 04/10/22 1546 04/11/22 0244 04/12/22 0241  NA 135 133*  --   K 3.8 3.9  --   CL 99 97*  --   CO2 24 24  --   GLUCOSE 121* 307*  --   BUN 13 21  --   CREATININE 0.79 1.02  --   CALCIUM 9.2 8.7*  --   MG  --   --  1.9  PHOS  --   --  3.7     GFR: Estimated Creatinine Clearance: 44.2 mL/min (by C-G formula based on SCr of 1.02 mg/dL). Liver Function Tests: Recent Labs  Lab 04/10/22 1546  AST 38  ALT 23  ALKPHOS 64  BILITOT 0.7  PROT 8.0  ALBUMIN 4.5    No results for input(s): "LIPASE", "AMYLASE" in the last 168 hours. No results for input(s): "AMMONIA" in the last 168 hours. Coagulation Profile: No results for input(s): "INR", "PROTIME" in the last 168 hours. Cardiac Enzymes: No results for input(s): "CKTOTAL", "CKMB", "CKMBINDEX", "TROPONINI" in the last 168 hours. BNP (last 3 results) No results for input(s): "PROBNP" in the last 8760 hours. HbA1C: Recent Labs    04/11/22 1120  HGBA1C 5.2    CBG: Recent Labs  Lab 04/13/22 0737 04/13/22 1128 04/13/22 1623 04/13/22 2131 04/14/22 0809  GLUCAP 118* 130* 176* 161* 112*    Lipid Profile: No results for input(s): "CHOL", "HDL", "LDLCALC", "TRIG", "CHOLHDL", "LDLDIRECT" in the last 72 hours. Thyroid Function Tests: No results for input(s): "TSH", "T4TOTAL", "FREET4", "T3FREE", "THYROIDAB" in the last 72 hours. Anemia Panel: No results for input(s): "VITAMINB12", "FOLATE", "FERRITIN", "TIBC", "IRON", "RETICCTPCT" in the last 72 hours. Sepsis Labs: No results for input(s): "PROCALCITON", "LATICACIDVEN" in the last 168 hours.  Recent Results (from the past 240 hour(s))  Resp Panel by RT-PCR (Flu A&B, Covid) Anterior Nasal Swab     Status: None   Collection Time: 04/10/22  3:46 PM   Specimen: Anterior Nasal Swab  Result Value Ref Range Status   SARS Coronavirus 2 by RT PCR NEGATIVE NEGATIVE Final    Comment: (NOTE) SARS-CoV-2 target nucleic acids are NOT DETECTED.  The SARS-CoV-2 RNA is generally detectable in upper respiratory specimens during the acute phase of infection. The lowest concentration of SARS-CoV-2 viral copies this assay can detect is 138 copies/mL. A negative result does not preclude SARS-Cov-2 infection and should not be used as  the sole basis for treatment or other patient management decisions. A negative result may occur with  improper specimen collection/handling, submission of specimen other than nasopharyngeal swab, presence of viral mutation(s) within the areas targeted by this assay, and inadequate number of viral copies(<138 copies/mL). A negative result must be combined with clinical observations, patient history, and epidemiological information. The expected result is Negative.  Fact Sheet for Patients:  BloggerCourse.com  Fact Sheet for Healthcare Providers:  SeriousBroker.it  This test is no t yet approved or cleared by the [place] FDA and  has been authorized for detection and/or diagnosis of SARS-CoV-2 by FDA under an Emergency Use Authorization (EUA). This EUA will remain  in effect (meaning this test can be used) for the duration of the COVID-19 declaration under Section 564(b)(1) of the Act, 21 U.S.C.section 360bbb-3(b)(1), unless the authorization is terminated  or revoked sooner.       Influenza A by PCR NEGATIVE NEGATIVE Final  Influenza B by PCR NEGATIVE NEGATIVE Final    Comment: (NOTE) The Xpert Xpress SARS-CoV-2/FLU/RSV plus assay is intended as an aid in the diagnosis of influenza from Nasopharyngeal swab specimens and should not be used as a sole basis for treatment. Nasal washings and aspirates are unacceptable for Xpert Xpress SARS-CoV-2/FLU/RSV testing.  Fact Sheet for Patients: BloggerCourse.com  Fact Sheet for Healthcare Providers: SeriousBroker.it  This test is not yet approved or cleared by the [place] FDA and has been authorized for detection and/or diagnosis of SARS-CoV-2 by FDA under an Emergency Use Authorization (EUA). This EUA will remain in effect (meaning this test can be used) for the duration of the COVID-19 declaration under Section 564(b)(1) of  the Act, 21 U.S.C. section 360bbb-3(b)(1), unless the authorization is terminated or revoked.  Performed at [facility], [address], [place], [place] [address]   MRSA Next Gen by PCR, Nasal     Status: None   Collection Time: 04/10/22  9:42 PM   Specimen: Nasal Mucosa; Nasal Swab  Result Value Ref Range Status   MRSA by PCR Next Gen NOT DETECTED NOT DETECTED Final    Comment: (NOTE) The GeneXpert MRSA Assay (FDA approved for NASAL specimens only), is one component of a comprehensive MRSA colonization surveillance program. It is not intended to diagnose MRSA infection nor to guide or monitor treatment for MRSA infections. Test performance is not FDA approved in patients less than 38 years old. Performed at [facility], [address] [place], [place] [address]   Expectorated Sputum Assessment w Gram Stain, Rflx to Resp Cult     Status: None   Collection Time: 04/11/22  5:02 PM   Specimen: Expectorated Sputum  Result Value Ref Range Status   Specimen Description EXPECTORATED SPUTUM  Final   Special Requests NONE  Final   Sputum evaluation   Final    Sputum specimen not acceptable for testing.  Please recollect.   Performed at [facility], [address], [place], [place] [address]    Report Status 04/11/2022 FINAL  Final  Expectorated Sputum Assessment w Gram Stain, Rflx to Resp Cult     Status: None   Collection Time: 04/11/22  8:11 PM   Specimen: Expectorated Sputum  Result Value Ref Range Status   Specimen Description EXPECTORATED SPUTUM  Final   Special Requests NONE  Final   Sputum evaluation   Final    THIS SPECIMEN IS ACCEPTABLE FOR SPUTUM CULTURE Performed at [facility], [address], [place], [place] [address]    Report Status 04/12/2022 FINAL  Final  Culture, Respiratory w Gram Stain     Status: None (Preliminary result)   Collection Time: 04/11/22  8:11 PM  Result Value Ref  Range Status   Specimen Description   Final    EXPECTORATED SPUTUM Performed at [facility], [address] [place], [place] [address]    Special Requests   Final    NONE Reflexed from [id number] Performed at [facility], [address], [place], [place] [address]    Gram Stain   Final    RARE WBC PRESENT, PREDOMINANTLY PMN FEW GRAM POSITIVE COCCI IN PAIRS IN CHAINS RARE GRAM NEGATIVE RODS    Culture   Final    RARE STENOTROPHOMONAS MALTOPHILIA SUSCEPTIBILITIES TO FOLLOW Performed at [facility], [address], [place], [place] [address]    Report Status PENDING  Incomplete  Radiology Studies: No results found.      Scheduled Meds:  atorvastatin  20 mg Oral Daily   Chlorhexidine Gluconate Cloth  6 each Topical Daily   doxycycline  100 mg Oral Q12H   enoxaparin (LOVENOX) injection  30 mg Subcutaneous Q24H   famotidine  20 mg Oral BID   feeding supplement  237 mL Oral TID BM   insulin aspart  0-5 Units Subcutaneous QHS   insulin aspart  0-9 Units Subcutaneous TID WC   ipratropium-albuterol  3 mL Nebulization TID   methylPREDNISolone (SOLU-MEDROL) injection  40 mg Intravenous Q12H   mometasone-formoterol  2 puff Inhalation BID   nicotine  14 mg Transdermal Daily   sertraline  50 mg Oral Daily   Continuous Infusions:   LOS: 3 days    Time spent: 35 minutes    Barb Merino, MD [facility] Pager [phone]

## 2022-04-15 DIAGNOSIS — J441 Chronic obstructive pulmonary disease with (acute) exacerbation: Secondary | ICD-10-CM | POA: Diagnosis not present

## 2022-04-15 LAB — GLUCOSE, CAPILLARY
Glucose-Capillary: 119 mg/dL — ABNORMAL HIGH (ref 70–99)
Glucose-Capillary: 126 mg/dL — ABNORMAL HIGH (ref 70–99)
Glucose-Capillary: 145 mg/dL — ABNORMAL HIGH (ref 70–99)
Glucose-Capillary: 174 mg/dL — ABNORMAL HIGH (ref 70–99)

## 2022-04-15 MED ORDER — PREDNISONE 50 MG PO TABS
50.0000 mg | ORAL_TABLET | Freq: Every day | ORAL | Status: DC
  Administered 2022-04-16 – 2022-04-19 (×4): 50 mg via ORAL
  Filled 2022-04-15 (×4): qty 1

## 2022-04-15 MED ORDER — IPRATROPIUM-ALBUTEROL 0.5-2.5 (3) MG/3ML IN SOLN
3.0000 mL | Freq: Two times a day (BID) | RESPIRATORY_TRACT | Status: DC
  Administered 2022-04-15 – 2022-04-19 (×8): 3 mL via RESPIRATORY_TRACT
  Filled 2022-04-15 (×6): qty 3

## 2022-04-15 NOTE — Progress Notes (Signed)
PROGRESS NOTE    Jaime Cline  [id number] DOB: 1960-10-12 DOA: 04/10/2022 PCP: Pcp, No    Brief Narrative:  Severe COPD and recurrent hospitalization presented with a 1 week of worsening shortness of breath.  Found to have respiratory distress in the emergency room and started on BiPAP and admitted to stepdown unit. Remains with very advanced COPD, frequent exacerbations. Stabilizing.  Waiting for SNF availability.   Assessment & Plan:   COPD exacerbation, respiratory distress and acute hypoxemic respiratory failure: Presented with severe respiratory distress needing BiPAP support, hypoxemic, pH 7.2.  Improved with BiPAP use.  Continue aggressive bronchodilator therapy, change to oral steroids.  Will treat with long-term steroid therapy. inhalational steroids, deep breathing exercises, incentive spirometry, chest physiotherapy and respiratory therapy consult. Doxycycline for 5/5 days.  Respiratory culture with stenotrophomonas.  Likely upper airway normal flora. Supplemental oxygen to keep saturations more than 90%. BiPAP as needed.  Not used since admission. Followed by palliative care.  Appreciate help.  Agreed for long-term palliative and hospice follow-up. Gets frequent episodes of chest tightness and bronchospasm, he did have some relief with morphine solution.   Will prescribe on discharge.  Severe protein calorie malnutrition and cachexia: BMI 13.  Nutrition following.  Palliative care as above.  Smoker: Counseled to quit.  He is ready to quit.  Nicotine patch.  Transfer to progressive bed.  Transfer to a skilled nursing facility with palliative care follow-up when available.  DVT prophylaxis: enoxaparin (LOVENOX) injection 30 mg Start: 04/11/22 1000   Code Status: DNR/DNI Family Communication: Sister on the phone 10/27. Disposition Plan: Status is: Inpatient Remains inpatient appropriate because: Severe COPD.  Needs SNF.     Consultants:  Palliative  care  Procedures:  None  Antimicrobials:  Doxycycline 10/23---   Subjective: Seen and examined.  No overnight events.  He feels better with the treatment and also morphine helps him.  He wants to ensure that I prescribed him morphine on discharge.  He is agreeable to go to short-term rehab then ultimately home.  Did not need to wear any nonrebreather or BiPAP.  Now on room air at complete rest.  Needed 2 L of oxygen on mobility.  Objective: Vitals:   04/15/22 0800 04/15/22 0833 04/15/22 0900 04/15/22 1005  BP: (!) 134/98  (!) 131/100 (!) 133/97  Pulse: 80  (!) 101 96  Resp: 20  17 19   Temp: 97.8 F (36.6 C)   97.8 F (36.6 C)  TempSrc: Oral   Oral  SpO2: 100% 99% 100% 100%  Weight:      Height:        Intake/Output Summary (Last 24 hours) at 04/15/2022 1058 Last data filed at 04/15/2022 0700 Gross per 24 hour  Intake 477 ml  Output 2551 ml  Net -2074 ml    Filed Weights   04/10/22 2205  Weight: 41.1 kg    Examination:  General exam: Chronically sick looking.  In mild distress on talking otherwise stable. Respiratory system: Poor air entry both sides.  No added sounds. Cardiovascular system: S1 & S2 heard, RRR. No pedal edema.  Cachectic legs. Gastrointestinal system: Abdomen is nondistended, soft and nontender. No organomegaly or masses felt. Normal bowel sounds heard. Central nervous system: Alert and oriented. No focal neurological deficits. Extremities: Symmetric 5 x 5 power. Skin: No rashes, lesions or ulcers    Data Reviewed: I have personally reviewed following labs and imaging studies  CBC: Recent Labs  Lab 04/10/22 1546 04/11/22 0244 04/12/22 [id number]  WBC 5.1 4.9 8.9  NEUTROABS 3.4  --  7.1  HGB 13.6 11.7* 10.6*  HCT 44.5 35.4* 31.8*  MCV 107.2* 98.9 99.7  PLT 212 269 295    Basic Metabolic Panel: Recent Labs  Lab 04/10/22 1546 04/11/22 0244 04/12/22 0241  NA 135 133*  --   K 3.8 3.9  --   CL 99 97*  --   CO2 24 24  --   GLUCOSE 121*  307*  --   BUN 13 21  --   CREATININE 0.79 1.02  --   CALCIUM 9.2 8.7*  --   MG  --   --  1.9  PHOS  --   --  3.7    GFR: Estimated Creatinine Clearance: 44.2 mL/min (by C-G formula based on SCr of 1.02 mg/dL). Liver Function Tests: Recent Labs  Lab 04/10/22 1546  AST 38  ALT 23  ALKPHOS 64  BILITOT 0.7  PROT 8.0  ALBUMIN 4.5    No results for input(s): "LIPASE", "AMYLASE" in the last 168 hours. No results for input(s): "AMMONIA" in the last 168 hours. Coagulation Profile: No results for input(s): "INR", "PROTIME" in the last 168 hours. Cardiac Enzymes: No results for input(s): "CKTOTAL", "CKMB", "CKMBINDEX", "TROPONINI" in the last 168 hours. BNP (last 3 results) No results for input(s): "PROBNP" in the last 8760 hours. HbA1C: No results for input(s): "HGBA1C" in the last 72 hours.  CBG: Recent Labs  Lab 04/14/22 1209 04/14/22 1640 04/14/22 1842 04/14/22 2158 04/15/22 0744  GLUCAP 183* 128* 146* 178* 119*    Lipid Profile: No results for input(s): "CHOL", "HDL", "LDLCALC", "TRIG", "CHOLHDL", "LDLDIRECT" in the last 72 hours. Thyroid Function Tests: No results for input(s): "TSH", "T4TOTAL", "FREET4", "T3FREE", "THYROIDAB" in the last 72 hours. Anemia Panel: No results for input(s): "VITAMINB12", "FOLATE", "FERRITIN", "TIBC", "IRON", "RETICCTPCT" in the last 72 hours. Sepsis Labs: No results for input(s): "PROCALCITON", "LATICACIDVEN" in the last 168 hours.  Recent Results (from the past 240 hour(s))  Resp Panel by RT-PCR (Flu A&B, Covid) Anterior Nasal Swab     Status: None   Collection Time: 04/10/22  3:46 PM   Specimen: Anterior Nasal Swab  Result Value Ref Range Status   SARS Coronavirus 2 by RT PCR NEGATIVE NEGATIVE Final    Comment: (NOTE) SARS-CoV-2 target nucleic acids are NOT DETECTED.  The SARS-CoV-2 RNA is generally detectable in upper respiratory specimens during the acute phase of infection. The lowest concentration of SARS-CoV-2 viral  copies this assay can detect is 138 copies/mL. A negative result does not preclude SARS-Cov-2 infection and should not be used as the sole basis for treatment or other patient management decisions. A negative result may occur with  improper specimen collection/handling, submission of specimen other than nasopharyngeal swab, presence of viral mutation(s) within the areas targeted by this assay, and inadequate number of viral copies(<138 copies/mL). A negative result must be combined with clinical observations, patient history, and epidemiological information. The expected result is Negative.  Fact Sheet for Patients:  EntrepreneurPulse.com.au  Fact Sheet for Healthcare Providers:  IncredibleEmployment.be  This test is no t yet approved or cleared by the [place] FDA and  has been authorized for detection and/or diagnosis of SARS-CoV-2 by FDA under an Emergency Use Authorization (EUA). This EUA will remain  in effect (meaning this test can be used) for the duration of the COVID-19 declaration under Section 564(b)(1) of the Act, 21 U.S.C.section 360bbb-3(b)(1), unless the authorization is terminated  or revoked sooner.  Influenza A by PCR NEGATIVE NEGATIVE Final   Influenza B by PCR NEGATIVE NEGATIVE Final    Comment: (NOTE) The Xpert Xpress SARS-CoV-2/FLU/RSV plus assay is intended as an aid in the diagnosis of influenza from Nasopharyngeal swab specimens and should not be used as a sole basis for treatment. Nasal washings and aspirates are unacceptable for Xpert Xpress SARS-CoV-2/FLU/RSV testing.  Fact Sheet for Patients: EntrepreneurPulse.com.au  Fact Sheet for Healthcare Providers: IncredibleEmployment.be  This test is not yet approved or cleared by the [place] FDA and has been authorized for detection and/or diagnosis of SARS-CoV-2 by FDA under an Emergency Use Authorization (EUA). This  EUA will remain in effect (meaning this test can be used) for the duration of the COVID-19 declaration under Section 564(b)(1) of the Act, 21 U.S.C. section 360bbb-3(b)(1), unless the authorization is terminated or revoked.  Performed at [facility], [place] [address] [place], [place] [address]   MRSA Next Gen by PCR, Nasal     Status: None   Collection Time: 04/10/22  9:42 PM   Specimen: Nasal Mucosa; Nasal Swab  Result Value Ref Range Status   MRSA by PCR Next Gen NOT DETECTED NOT DETECTED Final    Comment: (NOTE) The GeneXpert MRSA Assay (FDA approved for NASAL specimens only), is one component of a comprehensive MRSA colonization surveillance program. It is not intended to diagnose MRSA infection nor to guide or monitor treatment for MRSA infections. Test performance is not FDA approved in patients less than 73 years old. Performed at [facility], [place] [address], [place], [place] [address]   Expectorated Sputum Assessment w Gram Stain, Rflx to Resp Cult     Status: None   Collection Time: 04/11/22  5:02 PM   Specimen: Expectorated Sputum  Result Value Ref Range Status   Specimen Description EXPECTORATED SPUTUM  Final   Special Requests NONE  Final   Sputum evaluation   Final    Sputum specimen not acceptable for testing.  Please recollect.   Performed at [facility], [place] [address], [place], [place] [address]    Report Status 04/11/2022 FINAL  Final  Expectorated Sputum Assessment w Gram Stain, Rflx to Resp Cult     Status: None   Collection Time: 04/11/22  8:11 PM   Specimen: Expectorated Sputum  Result Value Ref Range Status   Specimen Description EXPECTORATED SPUTUM  Final   Special Requests NONE  Final   Sputum evaluation   Final    THIS SPECIMEN IS ACCEPTABLE FOR SPUTUM CULTURE Performed at [facility], [place] [address] [place], [place] [address]    Report Status 04/12/2022 FINAL  Final   Culture, Respiratory w Gram Stain     Status: None   Collection Time: 04/11/22  8:11 PM  Result Value Ref Range Status   Specimen Description   Final    EXPECTORATED SPUTUM Performed at [facility], [place] [address] [place], [place] [address]    Special Requests   Final    NONE Reflexed from [id number] Performed at [facility], [place] [address] [place], [place] [address]    Gram Stain   Final    RARE WBC PRESENT, PREDOMINANTLY PMN FEW GRAM POSITIVE COCCI IN PAIRS IN CHAINS RARE GRAM NEGATIVE RODS    Culture   Final    RARE STENOTROPHOMONAS MALTOPHILIA No Pseudomonas species isolated NO STAPHYLOCOCCUS AUREUS ISOLATED Performed at [facility], [place] [address], [place], [place]  [id number]    Report Status 04/14/2022 FINAL  Final   Organism ID, Bacteria STENOTROPHOMONAS MALTOPHILIA  Final      Susceptibility   Stenotrophomonas maltophilia - MIC*    LEVOFLOXACIN 0.25 SENSITIVE Sensitive     TRIMETH/SULFA <=20 SENSITIVE Sensitive     * RARE STENOTROPHOMONAS MALTOPHILIA         Radiology Studies: No results found.      Scheduled Meds:  atorvastatin  20 mg Oral Daily   Chlorhexidine Gluconate Cloth  6 each Topical Daily   doxycycline  100 mg Oral Q12H   enoxaparin (LOVENOX) injection  30 mg Subcutaneous Q24H   famotidine  20 mg Oral BID   feeding supplement  237 mL Oral TID BM   insulin aspart  0-5 Units Subcutaneous QHS   insulin aspart  0-9 Units Subcutaneous TID WC   ipratropium-albuterol  3 mL Nebulization BID   melatonin  3 mg Oral QHS   mometasone-formoterol  2 puff Inhalation BID   nicotine  14 mg Transdermal Daily   [START ON 04/16/2022] predniSONE  50 mg Oral Q breakfast   sertraline  50 mg Oral Daily   Continuous Infusions:   LOS: 4 days    Time spent: 35 minutes    Barb Merino, MD [facility] Pager [phone]

## 2022-04-15 NOTE — Plan of Care (Signed)

## 2022-04-15 NOTE — Progress Notes (Signed)
Pt refused BiPAP.

## 2022-04-16 DIAGNOSIS — J441 Chronic obstructive pulmonary disease with (acute) exacerbation: Secondary | ICD-10-CM | POA: Diagnosis not present

## 2022-04-16 LAB — GLUCOSE, CAPILLARY
Glucose-Capillary: 89 mg/dL (ref 70–99)
Glucose-Capillary: 175 mg/dL — ABNORMAL HIGH (ref 70–99)

## 2022-04-16 MED ORDER — LIP MEDEX EX OINT
TOPICAL_OINTMENT | CUTANEOUS | Status: DC | PRN
  Filled 2022-04-16: qty 7

## 2022-04-16 NOTE — Progress Notes (Signed)
  Daily Progress Note   Patient Name: Jaime Cline       Date: 04/16/2022 DOB: 1961-03-25  Age: 61 y.o. MRN#: [id number] Attending Physician: Barb Merino, MD Primary Care Physician: Pcp, No Admit Date: 04/10/2022 Length of Stay: 5 days  Attempted to see patient today though he was sleeping peacefully so did not awaken him. Left contact information care. Plan is for patient to be discharged to rehab with palliative care follow up; placed [facility] referral today for coordination of this care. As medical plans currently determined, palliative care team will sign off. Please reach out if our team can be of further assistance in the future. Thank you for involving our team in patient's care.    Chelsea Aus, DO Palliative Care Provider PMT # [phone]

## 2022-04-16 NOTE — Progress Notes (Signed)
PROGRESS NOTE    Jaime Cline  MWN:[id number] DOB: [birth year] DOA: 04/10/2022 PCP: Pcp, No    Brief Narrative:  Severe COPD and recurrent hospitalization presented with a 1 week of worsening shortness of breath.  Found to have respiratory distress in the emergency room and started on BiPAP and admitted to stepdown unit. Remains with very advanced COPD, frequent exacerbations. Stabilizing.  Waiting for SNF availability.   Assessment & Plan:   COPD exacerbation, respiratory distress and acute hypoxemic respiratory failure: Presented with severe respiratory distress needing BiPAP support, hypoxemic, pH 7.2.  Improved with BiPAP use.  Continue aggressive bronchodilator therapy, change to oral steroids.  Will treat with long-term steroid therapy. inhalational steroids, deep breathing exercises, incentive spirometry, chest physiotherapy and respiratory therapy consult. Completed 5 days of doxycycline.  Respiratory culture with stenotrophomonas.  Likely upper airway normal flora. Supplemental oxygen to keep saturations more than 90%. BiPAP as needed.  Not used since admission. Followed by palliative care.  Appreciate help.  Agreed for long-term palliative and hospice follow-up. Gets frequent episodes of chest tightness and bronchospasm, he did have some relief with morphine solution.   Will prescribe on discharge.  Severe protein calorie malnutrition and cachexia: BMI 13.  Nutrition following.  Palliative care as above.  Smoker: Counseled to quit.  He is ready to quit.  Nicotine patch.  Medically stable.  Transfer to a skilled nursing facility with palliative care follow-up when available.  DVT prophylaxis: enoxaparin (LOVENOX) injection 30 mg Start: 04/11/22 1000   Code Status: DNR/DNI Family Communication: Sister on the phone 10/27. Disposition Plan: Status is: Inpatient Remains inpatient appropriate because: Severe COPD.  Needs SNF.     Consultants:  Palliative  care  Procedures:  None  Antimicrobials:  Doxycycline 10/23--- 10/20   Subjective: Seen and examined.  No overnight events.  Encouraged to walk.  Ready to go to rehab.  Objective: Vitals:   04/16/22 0500 04/16/22 0530 04/16/22 0600 04/16/22 0736  BP:  115/82    Pulse:  94    Resp: 17 20 17    Temp:  97.8 F (36.6 C)    TempSrc:  Oral    SpO2:  98%  98%  Weight:      Height:        Intake/Output Summary (Last 24 hours) at 04/16/2022 1109 Last data filed at 04/16/2022 0553 Gross per 24 hour  Intake 240 ml  Output 1600 ml  Net -1360 ml    Filed Weights   04/10/22 2205  Weight: 41.1 kg    Examination:  General exam: Chronically sick looking.  Looks comfortable today. Respiratory system: Bilateral expiratory wheezes. Cardiovascular system: S1 & S2 heard, RRR. No pedal edema.  Cachectic legs. Gastrointestinal system: Abdomen is nondistended, soft and nontender. No organomegaly or masses felt. Normal bowel sounds heard. Central nervous system: Alert and oriented. No focal neurological deficits. Extremities: Symmetric 5 x 5 power. Skin: No rashes, lesions or ulcers    Data Reviewed: I have personally reviewed following labs and imaging studies  CBC: Recent Labs  Lab 04/10/22 1546 04/11/22 0244 04/12/22 0513  WBC 5.1 4.9 8.9  NEUTROABS 3.4  --  7.1  HGB 13.6 11.7* 10.6*  HCT 44.5 35.4* 31.8*  MCV 107.2* 98.9 99.7  PLT 212 269 214    Basic Metabolic Panel: Recent Labs  Lab 04/10/22 1546 04/11/22 0244 04/12/22 0241  NA 135 133*  --   K 3.8 3.9  --   CL 99 97*  --  CO2 24 24  --   GLUCOSE 121* 307*  --   BUN 13 21  --   CREATININE 0.79 1.02  --   CALCIUM 9.2 8.7*  --   MG  --   --  1.9  PHOS  --   --  3.7    GFR: Estimated Creatinine Clearance: 44.2 mL/min (by C-G formula based on SCr of 1.02 mg/dL). Liver Function Tests: Recent Labs  Lab 04/10/22 1546  AST 38  ALT 23  ALKPHOS 64  BILITOT 0.7  PROT 8.0  ALBUMIN 4.5    No results  for input(s): "LIPASE", "AMYLASE" in the last 168 hours. No results for input(s): "AMMONIA" in the last 168 hours. Coagulation Profile: No results for input(s): "INR", "PROTIME" in the last 168 hours. Cardiac Enzymes: No results for input(s): "CKTOTAL", "CKMB", "CKMBINDEX", "TROPONINI" in the last 168 hours. BNP (last 3 results) No results for input(s): "PROBNP" in the last 8760 hours. HbA1C: No results for input(s): "HGBA1C" in the last 72 hours.  CBG: Recent Labs  Lab 04/15/22 0744 04/15/22 1145 04/15/22 1635 04/15/22 2158 04/16/22 0738  GLUCAP 119* 126* 145* 174* 89    Lipid Profile: No results for input(s): "CHOL", "HDL", "LDLCALC", "TRIG", "CHOLHDL", "LDLDIRECT" in the last 72 hours. Thyroid Function Tests: No results for input(s): "TSH", "T4TOTAL", "FREET4", "T3FREE", "THYROIDAB" in the last 72 hours. Anemia Panel: No results for input(s): "VITAMINB12", "FOLATE", "FERRITIN", "TIBC", "IRON", "RETICCTPCT" in the last 72 hours. Sepsis Labs: No results for input(s): "PROCALCITON", "LATICACIDVEN" in the last 168 hours.  Recent Results (from the past 240 hour(s))  Resp Panel by RT-PCR (Flu A&B, Covid) Anterior Nasal Swab     Status: None   Collection Time: 04/10/22  3:46 PM   Specimen: Anterior Nasal Swab  Result Value Ref Range Status   SARS Coronavirus 2 by RT PCR NEGATIVE NEGATIVE Final    Comment: (NOTE) SARS-CoV-2 target nucleic acids are NOT DETECTED.  The SARS-CoV-2 RNA is generally detectable in upper respiratory specimens during the acute phase of infection. The lowest concentration of SARS-CoV-2 viral copies this assay can detect is 138 copies/mL. A negative result does not preclude SARS-Cov-2 infection and should not be used as the sole basis for treatment or other patient management decisions. A negative result may occur with  improper specimen collection/handling, submission of specimen other than nasopharyngeal swab, presence of viral mutation(s) within  the areas targeted by this assay, and inadequate number of viral copies(<138 copies/mL). A negative result must be combined with clinical observations, patient history, and epidemiological information. The expected result is Negative.  Fact Sheet for Patients:  BloggerCourse.com  Fact Sheet for Healthcare Providers:  SeriousBroker.it  This test is no t yet approved or cleared by the [place] FDA and  has been authorized for detection and/or diagnosis of SARS-CoV-2 by FDA under an Emergency Use Authorization (EUA). This EUA will remain  in effect (meaning this test can be used) for the duration of the COVID-19 declaration under Section 564(b)(1) of the Act, 21 U.S.C.section 360bbb-3(b)(1), unless the authorization is terminated  or revoked sooner.       Influenza A by PCR NEGATIVE NEGATIVE Final   Influenza B by PCR NEGATIVE NEGATIVE Final    Comment: (NOTE) The Xpert Xpress SARS-CoV-2/FLU/RSV plus assay is intended as an aid in the diagnosis of influenza from Nasopharyngeal swab specimens and should not be used as a sole basis for treatment. Nasal washings and aspirates are unacceptable for Xpert Xpress SARS-CoV-2/FLU/RSV testing.  Fact Sheet for Patients: EntrepreneurPulse.com.au  Fact Sheet for Healthcare Providers: IncredibleEmployment.be  This test is not yet approved or cleared by the [place] FDA and has been authorized for detection and/or diagnosis of SARS-CoV-2 by FDA under an Emergency Use Authorization (EUA). This EUA will remain in effect (meaning this test can be used) for the duration of the COVID-19 declaration under Section 564(b)(1) of the Act, 21 U.S.C. section 360bbb-3(b)(1), unless the authorization is terminated or revoked.  Performed at [facility], [place] [address] [place], [place] [address]   MRSA Next Gen by PCR, Nasal     Status:  None   Collection Time: 04/10/22  9:42 PM   Specimen: Nasal Mucosa; Nasal Swab  Result Value Ref Range Status   MRSA by PCR Next Gen NOT DETECTED NOT DETECTED Final    Comment: (NOTE) The GeneXpert MRSA Assay (FDA approved for NASAL specimens only), is one component of a comprehensive MRSA colonization surveillance program. It is not intended to diagnose MRSA infection nor to guide or monitor treatment for MRSA infections. Test performance is not FDA approved in patients less than 73 years old. Performed at [facility], [place] [address], [place], [place] [address]   Expectorated Sputum Assessment w Gram Stain, Rflx to Resp Cult     Status: None   Collection Time: 04/11/22  5:02 PM   Specimen: Expectorated Sputum  Result Value Ref Range Status   Specimen Description EXPECTORATED SPUTUM  Final   Special Requests NONE  Final   Sputum evaluation   Final    Sputum specimen not acceptable for testing.  Please recollect.   Performed at [facility], [place] [address], [place], [place] [address]    Report Status 04/11/2022 FINAL  Final  Expectorated Sputum Assessment w Gram Stain, Rflx to Resp Cult     Status: None   Collection Time: 04/11/22  8:11 PM   Specimen: Expectorated Sputum  Result Value Ref Range Status   Specimen Description EXPECTORATED SPUTUM  Final   Special Requests NONE  Final   Sputum evaluation   Final    THIS SPECIMEN IS ACCEPTABLE FOR SPUTUM CULTURE Performed at [facility], [place] [address], [place], [place] [address]    Report Status 04/12/2022 FINAL  Final  Culture, Respiratory w Gram Stain     Status: None   Collection Time: 04/11/22  8:11 PM  Result Value Ref Range Status   Specimen Description   Final    EXPECTORATED SPUTUM Performed at [facility], [place] [address], [place], [place] [address]    Special Requests   Final    NONE Reflexed from [id number] Performed at [facility], [place] [address], [place], [place] [address]    Gram Stain   Final    RARE WBC PRESENT, PREDOMINANTLY PMN FEW GRAM POSITIVE COCCI IN PAIRS IN CHAINS RARE GRAM NEGATIVE RODS    Culture   Final    RARE STENOTROPHOMONAS MALTOPHILIA No Pseudomonas species isolated NO STAPHYLOCOCCUS AUREUS ISOLATED Performed at [facility], [place] [address] [place], [place] [address]    Report Status 04/14/2022 FINAL  Final   Organism ID, Bacteria STENOTROPHOMONAS MALTOPHILIA  Final      Susceptibility   Stenotrophomonas maltophilia - MIC*    LEVOFLOXACIN 0.25 SENSITIVE Sensitive     TRIMETH/SULFA <=20 SENSITIVE Sensitive     * RARE STENOTROPHOMONAS MALTOPHILIA         Radiology Studies:  No results found.      Scheduled Meds:  atorvastatin  20 mg Oral Daily   Chlorhexidine Gluconate Cloth  6 each Topical Daily   enoxaparin (LOVENOX) injection  30 mg Subcutaneous Q24H   famotidine  20 mg Oral BID   feeding supplement  237 mL Oral TID BM   ipratropium-albuterol  3 mL Nebulization BID   mometasone-formoterol  2 puff Inhalation BID   nicotine  14 mg Transdermal Daily   predniSONE  50 mg Oral Q breakfast   sertraline  50 mg Oral Daily   Continuous Infusions:   LOS: 5 days    Time spent: 35 minutes    Dorcas Carrow, MD [facility] Pager [phone]

## 2022-04-17 DIAGNOSIS — J441 Chronic obstructive pulmonary disease with (acute) exacerbation: Secondary | ICD-10-CM | POA: Diagnosis not present

## 2022-04-17 MED ORDER — TRAZODONE HCL 50 MG PO TABS
50.0000 mg | ORAL_TABLET | Freq: Every evening | ORAL | Status: DC | PRN
  Administered 2022-04-17 – 2022-04-18 (×2): 50 mg via ORAL
  Filled 2022-04-17 (×2): qty 1

## 2022-04-17 NOTE — Progress Notes (Signed)
RT instructed patient on the use of a flutter valve. Patient is able to demonstrate back good technique.  

## 2022-04-17 NOTE — NC FL2 (Signed)
[place] LEVEL OF CARE SCREENING TOOL     IDENTIFICATION  Patient Name: Jaime Cline Birthdate: July 09, 1960 Sex: male Admission Date (Current Location): 04/10/2022  [place] and [place] Number:  Kathleen Argue UK:[id number] Facility and Address:  [facility],  [place], [place]      Provider Number: [id number]  Attending Physician Name and Address:  Barb Merino, MD  Relative Name and Phone Number:  Unkown, Defronzo Brother   [phone]  Aldana,Charlene Sister   [phone]  Owens,Chrystal Niece   [phone]    Current Level of Care: Hospital Recommended Level of Care: Rule Prior Approval Number:    Date Approved/Denied:   PASRR Number: [id number]  Discharge Plan: SNF    Current Diagnoses: Patient Active Problem List   Diagnosis Date Noted   Dyspnea 04/12/2022   Palliative care encounter 04/11/2022   Goals of care, counseling/discussion 04/11/2022   Counseling and coordination of care 04/11/2022   COPD with acute exacerbation ([place]) 04/10/2022   Syncope 09/28/2021   Protein-calorie malnutrition, severe 09/27/2021   Hyponatremia    Hypotension due to hypovolemia    Syncope and collapse 09/26/2021   Community acquired pneumonia of right lower lobe of lung    COPD exacerbation ([place]) 08/27/2021   Asthma, chronic 12/31/2012   Smoker 12/31/2012   Knee pain, bilateral 12/31/2012   Iron deficiency anemia 12/31/2012   Knee pain, chronic 12/31/2012    Orientation RESPIRATION BLADDER Height & Weight     Self, Time, Situation, Place  O2 (2L) Continent Weight: 90 lb 9.7 oz (41.1 kg) Height:  5\' 10"  (177.8 cm)  BEHAVIORAL SYMPTOMS/MOOD NEUROLOGICAL BOWEL NUTRITION STATUS      Continent    AMBULATORY STATUS COMMUNICATION OF NEEDS Skin   Limited Assist Verbally Skin abrasions                       Personal Care Assistance Level of Assistance  Bathing, Dressing, Feeding Bathing Assistance: Limited  assistance Feeding assistance: Independent Dressing Assistance: Limited assistance     Functional Limitations Info  Sight, Hearing, Speech Sight Info: Adequate Hearing Info: Adequate Speech Info: Adequate    SPECIAL CARE FACTORS FREQUENCY  PT (By licensed PT), OT (By licensed OT)     PT Frequency: Minimum 5x a week OT Frequency: Minimum 5x a week            Contractures Contractures Info: Not present    Additional Factors Info  Code Status, Allergies, Psychotropic Code Status Info: DNR Allergies Info: Other Psychotropic Info: sertraline (ZOLOFT) tablet 50 mg         Current Medications (04/17/2022):  This is the current hospital active medication list Current Facility-Administered Medications  Medication Dose Route Frequency Provider Last Rate Last Admin   acetaminophen (TYLENOL) tablet 650 mg  650 mg Oral Q6H PRN Barb Merino, MD   650 mg at 04/11/22 1959   Or   acetaminophen (TYLENOL) suppository 650 mg  650 mg Rectal Q6H PRN Barb Merino, MD       albuterol (PROVENTIL) (2.5 MG/3ML) 0.083% nebulizer solution 2.5 mg  2.5 mg Nebulization Q2H PRN Barb Merino, MD   2.5 mg at 04/16/22 1129   atorvastatin (LIPITOR) tablet 20 mg  20 mg Oral Daily Barb Merino, MD   20 mg at 04/17/22 1012   enoxaparin (LOVENOX) injection 30 mg  30 mg Subcutaneous Q24H Barb Merino, MD   30 mg at 04/17/22 1014   famotidine (PEPCID) tablet 20  mg  20 mg Oral BID Barb Merino, MD   20 mg at 04/17/22 1012   feeding supplement (ENSURE ENLIVE / ENSURE PLUS) liquid 237 mL  237 mL Oral TID BM Barb Merino, MD   237 mL at 04/17/22 1306   guaiFENesin (ROBITUSSIN) 100 MG/5ML liquid 10 mL  10 mL Oral Q6H PRN Barb Merino, MD   10 mL at 04/17/22 1013   ibuprofen (ADVIL) tablet 400 mg  400 mg Oral Q6H PRN Barb Merino, MD       ipratropium-albuterol (DUONEB) 0.5-2.5 (3) MG/3ML nebulizer solution 3 mL  3 mL Nebulization BID Barb Merino, MD   3 mL at 04/17/22 0815   lip balm (CARMEX)  ointment   Topical PRN Barb Merino, MD       menthol-cetylpyridinium (CEPACOL) lozenge 3 mg  1 lozenge Oral PRN Barb Merino, MD   3 mg at 04/15/22 1954   mometasone-formoterol (DULERA) 100-5 MCG/ACT inhaler 2 puff  2 puff Inhalation BID Barb Merino, MD   2 puff at 04/17/22 0815   morphine CONCENTRATE 10 MG/0.5ML oral solution 5 mg  5 mg Oral Q2H PRN Barb Merino, MD   5 mg at 04/16/22 2205   nicotine (NICODERM CQ - dosed in mg/24 hours) patch 14 mg  14 mg Transdermal Daily Barb Merino, MD   14 mg at 04/17/22 1012   ondansetron (ZOFRAN) tablet 4 mg  4 mg Oral Q6H PRN Barb Merino, MD       Or   ondansetron (ZOFRAN) injection 4 mg  4 mg Intravenous Q6H PRN Barb Merino, MD       Oral care mouth rinse  15 mL Mouth Rinse PRN Barb Merino, MD       predniSONE (DELTASONE) tablet 50 mg  50 mg Oral Q breakfast Barb Merino, MD   50 mg at 04/17/22 1012   sertraline (ZOLOFT) tablet 50 mg  50 mg Oral Daily Barb Merino, MD   50 mg at 04/17/22 1012   traZODone (DESYREL) tablet 50 mg  50 mg Oral QHS PRN Barb Merino, MD         Discharge Medications: Please see discharge summary for a list of discharge medications.  Relevant Imaging Results:  Relevant Lab Results:   Additional Information SSN [id number]  Ross Ludwig, LCSW

## 2022-04-17 NOTE — TOC Progression Note (Signed)
Transition of Care [facility]) - Progression Note    Patient Details  Name: Jaime Cline MRN: [id number] Date of Birth: Feb 10, 1961  Transition of Care [facility]) CM/SW Contact  Ross Ludwig, [place] Phone Number: 04/17/2022, 5:20 PM  Clinical Narrative:     Patient has been faxed out for SNF placement.  Awaiting bed offers.     Barriers to Discharge: Continued Medical Work up  Expected Discharge Plan and Services   In-house Referral: NA Discharge Planning Services: CM Consult Post Acute Care Choice: Durable Medical Equipment (RW with seat) Living arrangements for the past 2 months: Single Family Home                 DME Arranged: Walker rolling                     Social Determinants of Health (SDOH) Interventions    Readmission Risk Interventions    04/12/2022    4:02 PM 01/27/2022   12:27 PM  Readmission Risk Prevention Plan  Transportation Screening Complete Complete  PCP or Specialist Appt within 5-7 Days Complete Not Complete  Not Complete comments  new PCP made for 8/28  Home Care Screening Complete Complete  Medication Review (RN CM) Complete Complete

## 2022-04-17 NOTE — Progress Notes (Signed)
Physical Therapy Treatment Patient Details Name: Jaime Cline MRN: [id number] DOB: 11/13/1960 Today's Date: 04/17/2022   History of Present Illness Pt is a 61yo male presenting to [facility] ED on 10/23 secondary to SOB and dry cough; required BiPAP for COPD exacerbation. PMH: COPD, tobacco abuse, asthma, bronchitis, chronic back pain, chronic knee pain, HTN.    PT Comments    General Comments: AxO x 3 pleasant, following all commands but present with poor self medical insight.  VERY limited activity tolerance.  SEVERE DOE 4/4. Assisted OOB to amb required increased time and freq rest breaks.  General transfer comment: 25% VC's on proper hand placement as well as increased time/effort due to dyspnea.  Trial RA decreased from 92 at rest to briefly 88%.  HR ranged from 102 - 124. General Gait Details: Very limited activity tolerance as well as freq rest breaks due to dyspnea.  Trial amb in RA sats briefly decreased to 88% with HR 124.  Quick rebound to RA 94% with cease of activity. Pt still requesting to use his mask oxygen.  "I know how I feel" Pt plans to return home with a sister in law he lives with.     Recommendations for follow up therapy are one component of a multi-disciplinary discharge planning process, led by the attending physician.  Recommendations may be updated based on patient status, additional functional criteria and insurance authorization.  Follow Up Recommendations  Home health PT Can patient physically be transported by private vehicle: Yes   Assistance Recommended at Discharge Set up Supervision/Assistance  Patient can return home with the following A little help with walking and/or transfers;A little help with bathing/dressing/bathroom;Help with stairs or ramp for entrance;Assistance with cooking/housework;Assist for transportation   Equipment Recommendations  Rollator (4 wheels)    Recommendations for Other Services       Precautions / Restrictions  Precautions Precautions: Fall Precaution Comments: watch sats Restrictions Weight Bearing Restrictions: No     Mobility  Bed Mobility Overal bed mobility: Modified Independent Bed Mobility: Supine to Sit, Sit to Supine           General bed mobility comments: self able with increased time pending fatigue level.    Transfers Overall transfer level: Needs assistance Equipment used: None Transfers: Bed to chair/wheelchair/BSC Sit to Stand: Supervision, Min guard Stand pivot transfers: Supervision, Min guard         General transfer comment: 25% VC's on proper hand placement as well as increased time/effort due to dyspnea.  Trial RA decreased from 92 at rest to briefly 88%.  HR ranged from 102 - 124.    Ambulation/Gait Ambulation/Gait assistance: Min assist Gait Distance (Feet): 45 Feet Assistive device: Rolling walker (2 wheels) Gait Pattern/deviations: Step-through pattern Gait velocity: decreased     General Gait Details: Very limited activity tolerance as well as freq rest breaks due to dyspnea.  Trial amb in RA sats briefly decreased to 88% with HR 124.  Quick rebound to RA 94% with cease of activity.   Stairs             Wheelchair Mobility    Modified Rankin (Stroke Patients Only)       Balance                                            Cognition Arousal/Alertness: Awake/alert     Area of  Impairment: Awareness                               General Comments: AxO x 3 pleasant, following all commands but present with poor self medical insight.  VERY limited activity tolerance.  SEVERE DOE 4/4.        Exercises      General Comments        Pertinent Vitals/Pain Pain Assessment Pain Assessment: No/denies pain    Home Living                          Prior Function            PT Goals (current goals can now be found in the care plan section) Progress towards PT goals: Progressing toward  goals    Frequency    Min 3X/week      PT Plan Discharge plan needs to be updated;Equipment recommendations need to be updated    Co-evaluation              AM-PAC PT "6 Clicks" Mobility   Outcome Measure  Help needed turning from your back to your side while in a flat bed without using bedrails?: A Little Help needed moving from lying on your back to sitting on the side of a flat bed without using bedrails?: A Little Help needed moving to and from a bed to a chair (including a wheelchair)?: A Little Help needed standing up from a chair using your arms (e.g., wheelchair or bedside chair)?: A Little Help needed to walk in hospital room?: A Little Help needed climbing 3-5 steps with a railing? : A Lot 6 Click Score: 17    End of Session Equipment Utilized During Treatment: Oxygen;Gait belt Activity Tolerance: Treatment limited secondary to medical complications (Comment) (dyspnea) Patient left: in bed;with call bell/phone within reach;with bed alarm set Nurse Communication: Mobility status PT Visit Diagnosis: Unsteadiness on feet (R26.81);Difficulty in walking, not elsewhere classified (R26.2)     Time: 9381-0175 PT Time Calculation (min) (ACUTE ONLY): 26 min  Charges:  $Gait Training: 8-22 mins $Therapeutic Activity: 8-22 mins                     Felecia Shelling  PTA Acute  Rehabilitation Services Office M-F          [phone] Weekend pager [phone]

## 2022-04-17 NOTE — Progress Notes (Signed)
BiPAP Pt refused and not in room

## 2022-04-17 NOTE — Progress Notes (Signed)
Occupational Therapy Treatment Patient Details Name: Jaime Cline MRN: [id number] DOB: [birth year] Today's Date: 04/17/2022   History of present illness Pt is a 61yo male presenting to [facility] ED on 10/23 secondary to SOB and dry cough; required BiPAP for COPD exacerbation. PMH: COPD, tobacco abuse, asthma, bronchitis, chronic back pain, chronic knee pain, HTN.   OT comments  Patient progressing and showed improved bed mobility to Modified independent, improved standing balance and tolerance for ADLs including bathing, with need of frequent EOB sitting rest breaks when pt would place NRM, however sats on room air were 99-100%. Pt did mention, "Just because those numbers look good, doesn't mean that I feel good." Patient remains limited by generalized weakness and decreased activity tolerance along with deficits noted below. Pt continues to demonstrate good rehab potential and would benefit from continued skilled OT to increase safety and independence with ADLs and functional transfers to allow pt to return home safely and reduce caregiver burden and fall risk.  3-in-1 BSC also recommended.      Recommendations for follow up therapy are one component of a multi-disciplinary discharge planning process, led by the attending physician.  Recommendations may be updated based on patient status, additional functional criteria and insurance authorization.    Follow Up Recommendations  Skilled nursing-short term rehab (<3 hours/day)    Assistance Recommended at Discharge Intermittent Supervision/Assistance  Patient can return home with the following  A little help with walking and/or transfers;A little help with bathing/dressing/bathroom;Assistance with cooking/housework;Direct supervision/assist for medications management;Direct supervision/assist for financial management;Assist for transportation;Help with stairs or ramp for entrance   Equipment Recommendations  BSC/3in1 (Pt would benefit from 3-in-1 BSC  for shower chair at home)    Recommendations for Other Services      Precautions / Restrictions Precautions Precautions: Fall Precaution Comments: watch sats Restrictions Weight Bearing Restrictions: No       Mobility Bed Mobility Overal bed mobility: Modified Independent                  Transfers Overall transfer level: Needs assistance     Sit to Stand: Supervision                 Balance Overall balance assessment: Needs assistance Sitting-balance support: No upper extremity supported Sitting balance-Leahy Scale: Good     Standing balance support: During functional activity Standing balance-Leahy Scale: Fair Standing balance comment: Using RW for ambulation in room but standing without UE support for bathing.                           ADL either performed or assessed with clinical judgement   ADL Overall ADL's : Needs assistance/impaired Eating/Feeding: Independent   Grooming: Standing;Wash/dry hands;Supervision/safety Grooming Details (indicate cue type and reason): decreased activity tolerance for standing grooming tasks, takes multiple EOB sitting rest breaks for pacing. Upper Body Bathing: Set up;Sitting   Lower Body Bathing: Supervison/ safety;Set up;Cueing for compensatory techniques;Sitting/lateral leans;Sit to/from stand Lower Body Bathing Details (indicate cue type and reason): Cues for pacing/energy conservation Upper Body Dressing : Set up;Sitting   Lower Body Dressing: Sitting/lateral leans Lower Body Dressing Details (indicate cue type and reason): EOB sitting to don/doff socks. OT demonstrated figure 4 postiion for energy conservation and pt demonstrated understanding.   Toilet Transfer Details (indicate cue type and reason): Pt denied bathroom needs. Pt standing for bathing as tolerated without external assistance.         Functional mobility  during ADLs: Supervision/safety General ADL Comments: decreased activity  tolerance, impacted by cardiopulmonary status    Extremity/Trunk Assessment Upper Extremity Assessment Upper Extremity Assessment: Generalized weakness   Lower Extremity Assessment Lower Extremity Assessment: Generalized weakness   Cervical / Trunk Assessment Cervical / Trunk Assessment: Normal    Vision Baseline Vision/History: 0 No visual deficits Patient Visual Report: No change from baseline     Perception     Praxis      Cognition Arousal/Alertness: Awake/alert Behavior During Therapy: WFL for tasks assessed/performed Overall Cognitive Status: Within Functional Limits for tasks assessed Area of Impairment: Awareness                   Current Attention Level: Alternating     Safety/Judgement: Decreased awareness of safety, Decreased awareness of deficits Awareness: Emergent            Exercises Other Exercises Other Exercises: Energy conservation handouts provided wiht education on strategies with examples, uses of AE/DME. Pt receptive.    Shoulder Instructions       General Comments      Pertinent Vitals/ Pain       Pain Assessment Pain Assessment: No/denies pain  Home Living                                          Prior Functioning/Environment              Frequency  Min 2X/week        Progress Toward Goals  OT Goals(current goals can now be found in the care plan section)  Progress towards OT goals: Progressing toward goals  Acute Rehab OT Goals Patient Stated Goal: Feel as good as "the numbers" on pulse ox show: 99-100%. OT Goal Formulation: With patient Time For Goal Achievement: 04/27/22 Potential to Achieve Goals: Good  Plan Equipment recommendations need to be updated    Co-evaluation                 AM-PAC OT "6 Clicks" Daily Activity     Outcome Measure   Help from another person eating meals?: None Help from another person taking care of personal grooming?: None Help from another  person toileting, which includes using toliet, bedpan, or urinal?: A Little Help from another person bathing (including washing, rinsing, drying)?: A Little Help from another person to put on and taking off regular upper body clothing?: None Help from another person to put on and taking off regular lower body clothing?: A Little 6 Click Score: 21    End of Session Equipment Utilized During Treatment: Rolling walker (2 wheels);Oxygen (Sats at 99-100% room air with Pt using NRM PRN)  OT Visit Diagnosis: Other abnormalities of gait and mobility (R26.89);Muscle weakness (generalized) (M62.81);Other symptoms and signs involving cognitive function   Activity Tolerance Patient tolerated treatment well   Patient Left in chair;with call bell/phone within reach;with chair alarm set   Nurse Communication Mobility status        Time: 3329-5188 OT Time Calculation (min): 28 min  Charges: OT General Charges $OT Visit: 1 Visit OT Treatments $Self Care/Home Management : 8-22 mins $Therapeutic Activity: 8-22 mins  Anderson Malta, OT Acute Rehab Services Office: [phone] 04/17/2022  Julien Girt 04/17/2022, 12:18 PM

## 2022-04-17 NOTE — Progress Notes (Signed)
PROGRESS NOTE    Jaime Cline  [id number] DOB: February 21, 1961 DOA: 04/10/2022 PCP: Pcp, No    Brief Narrative:  Severe COPD and recurrent hospitalization presented with a 1 week of worsening shortness of breath.  Found to have respiratory distress in the emergency room and started on BiPAP and admitted to stepdown unit. Remains with very advanced COPD, frequent exacerbations. Stabilizing.  Waiting for SNF availability.   Assessment & Plan:   COPD exacerbation, respiratory distress and acute hypoxemic respiratory failure: Presented with severe respiratory distress needing BiPAP support, hypoxemic, pH 7.2.  Improved with BiPAP use.  Continue aggressive bronchodilator therapy, change to oral steroids.  Will treat with long-term steroid therapy. inhalational steroids, deep breathing exercises, incentive spirometry, chest physiotherapy and respiratory therapy consult. Completed 5 days of doxycycline.  Respiratory culture with stenotrophomonas.  Likely upper airway normal flora. Supplemental oxygen to keep saturations more than 90%. BiPAP as needed.  Not used since admission. Followed by palliative care.  Appreciate help.  Agreed for long-term palliative and hospice follow-up. Gets frequent episodes of chest tightness and bronchospasm, relieved with morphine drops. Will prescribe on discharge.  Severe protein calorie malnutrition and cachexia: BMI 13.  Nutrition following.  Palliative care as above.  Smoker: Counseled to quit.  He is ready to quit.  Nicotine patch.  Medically stable.  Transfer to a skilled nursing facility with palliative care follow-up when available.  If unable to secure a SNF placement, will need to go home with home hospice support.  DVT prophylaxis: enoxaparin (LOVENOX) injection 30 mg Start: 04/11/22 1000   Code Status: DNR/DNI Family Communication: None today. Disposition Plan: Status is: Inpatient Remains inpatient appropriate because: Severe COPD.  Needs SNF.      Consultants:  Palliative care  Procedures:  None  Antimicrobials:  Doxycycline 10/23--- 10/20   Subjective: Seen and examined.  He had episode of coughing and chest tightness last night but now feels better.  At rest, he is on room air.  Able to communicate in full sentences. He wanted something at night for helping sleep.  Objective: Vitals:   04/16/22 2004 04/16/22 2057 04/17/22 0418 04/17/22 0815  BP:  (!) 138/94 (!) 151/80   Pulse:  98 95 85  Resp:  20 20 (!) 22  Temp:  98.2 F (36.8 C) 97.9 F (36.6 C)   TempSrc:  Oral Oral   SpO2: 100% 100% 100% 99%  Weight:      Height:        Intake/Output Summary (Last 24 hours) at 04/17/2022 1137 Last data filed at 04/17/2022 0500 Gross per 24 hour  Intake --  Output 8000 ml  Net -8000 ml    Filed Weights   04/10/22 2205  Weight: 41.1 kg    Examination:  General exam: Calm and comfortable on room air.  In mild distress while talking. Respiratory system: Bilateral expiratory wheezes. Cardiovascular system: S1 & S2 heard, RRR. No pedal edema.  Cachectic legs. Gastrointestinal system: Abdomen is nondistended, soft and nontender. No organomegaly or masses felt. Normal bowel sounds heard. Central nervous system: Alert and oriented. No focal neurological deficits. Extremities: Symmetric 5 x 5 power. Skin: No rashes, lesions or ulcers    Data Reviewed: I have personally reviewed following labs and imaging studies  CBC: Recent Labs  Lab 04/10/22 1546 04/11/22 0244 04/12/22 0513  WBC 5.1 4.9 8.9  NEUTROABS 3.4  --  7.1  HGB 13.6 11.7* 10.6*  HCT 44.5 35.4* 31.8*  MCV 107.2* 98.9 99.7  PLT 212 269 539    Basic Metabolic Panel: Recent Labs  Lab 04/10/22 1546 04/11/22 0244 04/12/22 0241  NA 135 133*  --   K 3.8 3.9  --   CL 99 97*  --   CO2 24 24  --   GLUCOSE 121* 307*  --   BUN 13 21  --   CREATININE 0.79 1.02  --   CALCIUM 9.2 8.7*  --   MG  --   --  1.9  PHOS  --   --  3.7     GFR: Estimated Creatinine Clearance: 44.2 mL/min (by C-G formula based on SCr of 1.02 mg/dL). Liver Function Tests: Recent Labs  Lab 04/10/22 1546  AST 38  ALT 23  ALKPHOS 64  BILITOT 0.7  PROT 8.0  ALBUMIN 4.5    No results for input(s): "LIPASE", "AMYLASE" in the last 168 hours. No results for input(s): "AMMONIA" in the last 168 hours. Coagulation Profile: No results for input(s): "INR", "PROTIME" in the last 168 hours. Cardiac Enzymes: No results for input(s): "CKTOTAL", "CKMB", "CKMBINDEX", "TROPONINI" in the last 168 hours. BNP (last 3 results) No results for input(s): "PROBNP" in the last 8760 hours. HbA1C: No results for input(s): "HGBA1C" in the last 72 hours.  CBG: Recent Labs  Lab 04/15/22 1145 04/15/22 1635 04/15/22 2158 04/16/22 0738 04/16/22 1110  GLUCAP 126* 145* 174* 89 175*    Lipid Profile: No results for input(s): "CHOL", "HDL", "LDLCALC", "TRIG", "CHOLHDL", "LDLDIRECT" in the last 72 hours. Thyroid Function Tests: No results for input(s): "TSH", "T4TOTAL", "FREET4", "T3FREE", "THYROIDAB" in the last 72 hours. Anemia Panel: No results for input(s): "VITAMINB12", "FOLATE", "FERRITIN", "TIBC", "IRON", "RETICCTPCT" in the last 72 hours. Sepsis Labs: No results for input(s): "PROCALCITON", "LATICACIDVEN" in the last 168 hours.  Recent Results (from the past 240 hour(s))  Resp Panel by RT-PCR (Flu A&B, Covid) Anterior Nasal Swab     Status: None   Collection Time: 04/10/22  3:46 PM   Specimen: Anterior Nasal Swab  Result Value Ref Range Status   SARS Coronavirus 2 by RT PCR NEGATIVE NEGATIVE Final    Comment: (NOTE) SARS-CoV-2 target nucleic acids are NOT DETECTED.  The SARS-CoV-2 RNA is generally detectable in upper respiratory specimens during the acute phase of infection. The lowest concentration of SARS-CoV-2 viral copies this assay can detect is 138 copies/mL. A negative result does not preclude SARS-Cov-2 infection and should not be  used as the sole basis for treatment or other patient management decisions. A negative result may occur with  improper specimen collection/handling, submission of specimen other than nasopharyngeal swab, presence of viral mutation(s) within the areas targeted by this assay, and inadequate number of viral copies(<138 copies/mL). A negative result must be combined with clinical observations, patient history, and epidemiological information. The expected result is Negative.  Fact Sheet for Patients:  EntrepreneurPulse.com.au  Fact Sheet for Healthcare Providers:  IncredibleEmployment.be  This test is no t yet approved or cleared by the [place] FDA and  has been authorized for detection and/or diagnosis of SARS-CoV-2 by FDA under an Emergency Use Authorization (EUA). This EUA will remain  in effect (meaning this test can be used) for the duration of the COVID-19 declaration under Section 564(b)(1) of the Act, 21 U.S.C.section 360bbb-3(b)(1), unless the authorization is terminated  or revoked sooner.       Influenza A by PCR NEGATIVE NEGATIVE Final   Influenza B by PCR NEGATIVE NEGATIVE Final    Comment: (NOTE) The  Xpert Xpress SARS-CoV-2/FLU/RSV plus assay is intended as an aid in the diagnosis of influenza from Nasopharyngeal swab specimens and should not be used as a sole basis for treatment. Nasal washings and aspirates are unacceptable for Xpert Xpress SARS-CoV-2/FLU/RSV testing.  Fact Sheet for Patients: BloggerCourse.com  Fact Sheet for Healthcare Providers: SeriousBroker.it  This test is not yet approved or cleared by the [place] FDA and has been authorized for detection and/or diagnosis of SARS-CoV-2 by FDA under an Emergency Use Authorization (EUA). This EUA will remain in effect (meaning this test can be used) for the duration of the COVID-19 declaration under Section  564(b)(1) of the Act, 21 U.S.C. section 360bbb-3(b)(1), unless the authorization is terminated or revoked.  Performed at [facility], [address], [place], [place] [address]   MRSA Next Gen by PCR, Nasal     Status: None   Collection Time: 04/10/22  9:42 PM   Specimen: Nasal Mucosa; Nasal Swab  Result Value Ref Range Status   MRSA by PCR Next Gen NOT DETECTED NOT DETECTED Final    Comment: (NOTE) The GeneXpert MRSA Assay (FDA approved for NASAL specimens only), is one component of a comprehensive MRSA colonization surveillance program. It is not intended to diagnose MRSA infection nor to guide or monitor treatment for MRSA infections. Test performance is not FDA approved in patients less than 66 years old. Performed at [facility], [address] [place], [place] [address]   Expectorated Sputum Assessment w Gram Stain, Rflx to Resp Cult     Status: None   Collection Time: 04/11/22  5:02 PM   Specimen: Expectorated Sputum  Result Value Ref Range Status   Specimen Description EXPECTORATED SPUTUM  Final   Special Requests NONE  Final   Sputum evaluation   Final    Sputum specimen not acceptable for testing.  Please recollect.   Performed at [facility] [address], [place], [place] [address]    Report Status 04/11/2022 FINAL  Final  Expectorated Sputum Assessment w Gram Stain, Rflx to Resp Cult     Status: None   Collection Time: 04/11/22  8:11 PM   Specimen: Expectorated Sputum  Result Value Ref Range Status   Specimen Description EXPECTORATED SPUTUM  Final   Special Requests NONE  Final   Sputum evaluation   Final    THIS SPECIMEN IS ACCEPTABLE FOR SPUTUM CULTURE Performed at [facility], [address], [place], [place] [address]    Report Status 04/12/2022 FINAL  Final  Culture, Respiratory w Gram Stain     Status: None   Collection Time: 04/11/22  8:11 PM  Result Value Ref Range  Status   Specimen Description   Final    EXPECTORATED SPUTUM Performed at [facility], [address], [place], [place] [address]    Special Requests   Final    NONE Reflexed from [id number] Performed at [facility], [address], [place], [place] [address]    Gram Stain   Final    RARE WBC PRESENT, PREDOMINANTLY PMN FEW GRAM POSITIVE COCCI IN PAIRS IN CHAINS RARE GRAM NEGATIVE RODS    Culture   Final    RARE STENOTROPHOMONAS MALTOPHILIA No Pseudomonas species isolated NO STAPHYLOCOCCUS AUREUS ISOLATED Performed at [facility], [address], [place], [place] [address]    Report Status 04/14/2022 FINAL  Final   Organism ID, Bacteria STENOTROPHOMONAS MALTOPHILIA  Final  Susceptibility   Stenotrophomonas maltophilia - MIC*    LEVOFLOXACIN 0.25 SENSITIVE Sensitive     TRIMETH/SULFA <=20 SENSITIVE Sensitive     * RARE STENOTROPHOMONAS MALTOPHILIA         Radiology Studies: No results found.      Scheduled Meds:  atorvastatin  20 mg Oral Daily   enoxaparin (LOVENOX) injection  30 mg Subcutaneous Q24H   famotidine  20 mg Oral BID   feeding supplement  237 mL Oral TID BM   ipratropium-albuterol  3 mL Nebulization BID   mometasone-formoterol  2 puff Inhalation BID   nicotine  14 mg Transdermal Daily   predniSONE  50 mg Oral Q breakfast   sertraline  50 mg Oral Daily   Continuous Infusions:   LOS: 6 days    Time spent: 35 minutes    Dorcas Carrow, MD [facility] Pager [phone]

## 2022-04-17 NOTE — Progress Notes (Signed)
SATURATION QUALIFICATIONS: (This note is used to comply with regulatory documentation for home oxygen)  Patient Saturations on Room Air at Rest = 97%  Patient Saturations on Room Air while Ambulating = 87%  Patient Saturations on 2 Liters of oxygen while Ambulating = 93%  Please briefly explain why patient needs home oxygen:

## 2022-04-18 DIAGNOSIS — J441 Chronic obstructive pulmonary disease with (acute) exacerbation: Secondary | ICD-10-CM | POA: Diagnosis not present

## 2022-04-18 NOTE — Progress Notes (Signed)
Nutrition Follow-up  DOCUMENTATION CODES:   Severe malnutrition in context of chronic illness  INTERVENTION:  -Continue liberalized diet to optimize po intake -Continue Ensure Plus HP TID -Encourage high calorie, high protein meals and snacks  NUTRITION DIAGNOSIS:  Severe Malnutrition related to chronic illness (COPD) as evidenced by severe fat depletion, severe muscle depletion, percent weight loss.  GOAL:  Patient will meet greater than or equal to 90% of their needs met  MONITOR:  PO intake, Supplement acceptance  REASON FOR ASSESSMENT:  Consult Assessment of nutrition requirement/status  ASSESSMENT:  Pt is a 61yo M with PMH of COPD, asthma, HTN, and alcohol abuse who presents with worsening SOB.  Pt's po intake has improved during admission. Now eating 50-100% of most meals and accepting Ensure Plus HP TID. ONS TID provides 1050kcal (61%kcal needs) and 60g protein (75%protein needs). Average calorie and protein intake from meals in the last 2 days:1472kcal and 49g protein. Pt is meeting estimated nutrient needs with current diet and supplements. Nutrition related lab values reviewed. BG maintaining <180. Continue current nutrition interventions.  Diet Order:   Diet Order             Diet regular Room service appropriate? Yes; Fluid consistency: Thin  Diet effective now                   EDUCATION NEEDS:   Education needs have been addressed  Skin:  Skin Assessment: Reviewed RN Assessment  Last BM:  10/29  Height:   Ht Readings from Last 1 Encounters:  04/10/22 _0  (1.778 m)    Weight:   Wt Readings from Last 1 Encounters:  04/10/22 41.1 kg    Ideal Body Weight:  75.5 kg  BMI:  Body mass index is 13 kg/m.  Estimated Nutritional Needs:   Kcal:  1715-2210kcal ([place] x 1.4-1.8 (REE:1227))  Protein:  80-100g (2.0-2.5g/kg)  Fluid:  >1760m  KCandise Bowens MS, RD, LDN, CNSC See AMiON for contact information

## 2022-04-18 NOTE — Progress Notes (Signed)
PROGRESS NOTE    Jaime Cline  WCB:[id number] DOB: [birth year] DOA: 04/10/2022 PCP: Pcp, No    Brief Narrative:  Severe COPD and recurrent hospitalization presented with a 1 week of worsening shortness of breath.  Found to have respiratory distress in the emergency room and started on BiPAP and admitted to stepdown unit with very advanced COPD, frequent exacerbations. Stabilizing.  From the SNF, awaiting insurance authorization.   Assessment & Plan:   COPD exacerbation, respiratory distress and acute hypoxemic respiratory failure: Presented with severe respiratory distress needing BiPAP support, hypoxemic, pH 7.2.  Improved with BiPAP use.  Continue aggressive bronchodilator therapy, change to oral steroids.  Will treat with long-term steroid therapy. inhalational steroids, deep breathing exercises, incentive spirometry. Completed 5 days of doxycycline.  Respiratory culture with stenotrophomonas.  Likely upper airway normal flora. Supplemental oxygen to keep saturations more than 90%. No need to use BiPAP. Followed by palliative care.  Appreciate help.  Agreed for long-term palliative and hospice follow-up. Gets frequent episodes of chest tightness and bronchospasm, relieved with morphine drops. Will prescribe on discharge.  Severe protein calorie malnutrition and cachexia: BMI 13.  Nutrition following.  Palliative care as above.  Smoker: Counseled to quit.  He is ready to quit.  Nicotine patch.  Medically stable.  Transfer to a skilled nursing facility with palliative care follow-up when available.  He will be going to skilled nursing rehab with palliative care follow-up.  From the skilled nursing rehab he will go home with home hospice program for advanced COPD.   DVT prophylaxis: enoxaparin (LOVENOX) injection 30 mg Start: 04/11/22 1000   Code Status: DNR/DNI Family Communication: None today. Disposition Plan: Status is: Inpatient Remains inpatient appropriate because: Severe  COPD.  Needs SNF.     Consultants:  Palliative care  Procedures:  None  Antimicrobials:  Doxycycline 10/23--- 10/20   Subjective: Patient seen and examined.  No overnight events.  He tells me that he got the best sleep last night with trazodone and a small dose of morphine.  He was very happy to know that.  Objective: Vitals:   04/17/22 1932 04/17/22 2115 04/18/22 0502 04/18/22 0817  BP:  115/82 110/87   Pulse:  (!) 101 93   Resp:  17 20   Temp:  99 F (37.2 C) 98.1 F (36.7 C)   TempSrc:  Oral Oral   SpO2: 100% 100% 95% 99%  Weight:      Height:        Intake/Output Summary (Last 24 hours) at 04/18/2022 1154 Last data filed at 04/18/2022 0600 Gross per 24 hour  Intake 780 ml  Output 800 ml  Net -20 ml   Filed Weights   04/10/22 2205  Weight: 41.1 kg    Examination:  General exam: Calm and comfortable on room air.  Looks comfortable and able to talk in complete sentences. Respiratory system: Bilateral expiratory wheezes.  No crackles. Cardiovascular system: S1 & S2 heard, RRR. No pedal edema.  Cachectic legs. Gastrointestinal system: Abdomen is nondistended, soft and nontender. No organomegaly or masses felt. Normal bowel sounds heard. Central nervous system: Alert and oriented. No focal neurological deficits. Extremities: Symmetric 5 x 5 power. Skin: No rashes, lesions or ulcers    Data Reviewed: I have personally reviewed following labs and imaging studies  CBC: Recent Labs  Lab 04/12/22 0513  WBC 8.9  NEUTROABS 7.1  HGB 10.6*  HCT 31.8*  MCV 99.7  PLT 214   Basic Metabolic Panel: Recent Labs  Lab 04/12/22 0241  MG 1.9  PHOS 3.7   GFR: Estimated Creatinine Clearance: 44.2 mL/min (by C-G formula based on SCr of 1.02 mg/dL). Liver Function Tests: No results for input(s): "AST", "ALT", "ALKPHOS", "BILITOT", "PROT", "ALBUMIN" in the last 168 hours.  No results for input(s): "LIPASE", "AMYLASE" in the last 168 hours. No results for  input(s): "AMMONIA" in the last 168 hours. Coagulation Profile: No results for input(s): "INR", "PROTIME" in the last 168 hours. Cardiac Enzymes: No results for input(s): "CKTOTAL", "CKMB", "CKMBINDEX", "TROPONINI" in the last 168 hours. BNP (last 3 results) No results for input(s): "PROBNP" in the last 8760 hours. HbA1C: No results for input(s): "HGBA1C" in the last 72 hours.  CBG: Recent Labs  Lab 04/15/22 1145 04/15/22 1635 04/15/22 2158 04/16/22 0738 04/16/22 1110  GLUCAP 126* 145* 174* 89 175*   Lipid Profile: No results for input(s): "CHOL", "HDL", "LDLCALC", "TRIG", "CHOLHDL", "LDLDIRECT" in the last 72 hours. Thyroid Function Tests: No results for input(s): "TSH", "T4TOTAL", "FREET4", "T3FREE", "THYROIDAB" in the last 72 hours. Anemia Panel: No results for input(s): "VITAMINB12", "FOLATE", "FERRITIN", "TIBC", "IRON", "RETICCTPCT" in the last 72 hours. Sepsis Labs: No results for input(s): "PROCALCITON", "LATICACIDVEN" in the last 168 hours.  Recent Results (from the past 240 hour(s))  Resp Panel by RT-PCR (Flu A&B, Covid) Anterior Nasal Swab     Status: None   Collection Time: 04/10/22  3:46 PM   Specimen: Anterior Nasal Swab  Result Value Ref Range Status   SARS Coronavirus 2 by RT PCR NEGATIVE NEGATIVE Final    Comment: (NOTE) SARS-CoV-2 target nucleic acids are NOT DETECTED.  The SARS-CoV-2 RNA is generally detectable in upper respiratory specimens during the acute phase of infection. The lowest concentration of SARS-CoV-2 viral copies this assay can detect is 138 copies/mL. A negative result does not preclude SARS-Cov-2 infection and should not be used as the sole basis for treatment or other patient management decisions. A negative result may occur with  improper specimen collection/handling, submission of specimen other than nasopharyngeal swab, presence of viral mutation(s) within the areas targeted by this assay, and inadequate number of  viral copies(<138 copies/mL). A negative result must be combined with clinical observations, patient history, and epidemiological information. The expected result is Negative.  Fact Sheet for Patients:  EntrepreneurPulse.com.au  Fact Sheet for Healthcare Providers:  IncredibleEmployment.be  This test is no t yet approved or cleared by the [place] FDA and  has been authorized for detection and/or diagnosis of SARS-CoV-2 by FDA under an Emergency Use Authorization (EUA). This EUA will remain  in effect (meaning this test can be used) for the duration of the COVID-19 declaration under Section 564(b)(1) of the Act, 21 U.S.C.section 360bbb-3(b)(1), unless the authorization is terminated  or revoked sooner.       Influenza A by PCR NEGATIVE NEGATIVE Final   Influenza B by PCR NEGATIVE NEGATIVE Final    Comment: (NOTE) The Xpert Xpress SARS-CoV-2/FLU/RSV plus assay is intended as an aid in the diagnosis of influenza from Nasopharyngeal swab specimens and should not be used as a sole basis for treatment. Nasal washings and aspirates are unacceptable for Xpert Xpress SARS-CoV-2/FLU/RSV testing.  Fact Sheet for Patients: EntrepreneurPulse.com.au  Fact Sheet for Healthcare Providers: IncredibleEmployment.be  This test is not yet approved or cleared by the [place] FDA and has been authorized for detection and/or diagnosis of SARS-CoV-2 by FDA under an Emergency Use Authorization (EUA). This EUA will remain in effect (meaning this test can be used)  for the duration of the COVID-19 declaration under Section 564(b)(1) of the Act, 21 U.S.C. section 360bbb-3(b)(1), unless the authorization is terminated or revoked.  Performed at [facility], [address], [place], [place] [address]   MRSA Next Gen by PCR, Nasal     Status: None   Collection Time: 04/10/22  9:42 PM   Specimen: Nasal  Mucosa; Nasal Swab  Result Value Ref Range Status   MRSA by PCR Next Gen NOT DETECTED NOT DETECTED Final    Comment: (NOTE) The GeneXpert MRSA Assay (FDA approved for NASAL specimens only), is one component of a comprehensive MRSA colonization surveillance program. It is not intended to diagnose MRSA infection nor to guide or monitor treatment for MRSA infections. Test performance is not FDA approved in patients less than 72 years old. Performed at [facility], [address], [place], [place] [address]   Expectorated Sputum Assessment w Gram Stain, Rflx to Resp Cult     Status: None   Collection Time: 04/11/22  5:02 PM   Specimen: Expectorated Sputum  Result Value Ref Range Status   Specimen Description EXPECTORATED SPUTUM  Final   Special Requests NONE  Final   Sputum evaluation   Final    Sputum specimen not acceptable for testing.  Please recollect.   Performed at [facility], [address] [place], [place] [address]    Report Status 04/11/2022 FINAL  Final  Expectorated Sputum Assessment w Gram Stain, Rflx to Resp Cult     Status: None   Collection Time: 04/11/22  8:11 PM   Specimen: Expectorated Sputum  Result Value Ref Range Status   Specimen Description EXPECTORATED SPUTUM  Final   Special Requests NONE  Final   Sputum evaluation   Final    THIS SPECIMEN IS ACCEPTABLE FOR SPUTUM CULTURE Performed at [facility], [address], [place], [place] [address]    Report Status 04/12/2022 FINAL  Final  Culture, Respiratory w Gram Stain     Status: None   Collection Time: 04/11/22  8:11 PM  Result Value Ref Range Status   Specimen Description   Final    EXPECTORATED SPUTUM Performed at [facility], [address], [place], [place] [address]    Special Requests   Final    NONE Reflexed from [id number] Performed at [facility], [address] [place], [place] [address]    Gram  Stain   Final    RARE WBC PRESENT, PREDOMINANTLY PMN FEW GRAM POSITIVE COCCI IN PAIRS IN CHAINS RARE GRAM NEGATIVE RODS    Culture   Final    RARE STENOTROPHOMONAS MALTOPHILIA No Pseudomonas species isolated NO STAPHYLOCOCCUS AUREUS ISOLATED Performed at [facility], [address] [place], [place] [address]    Report Status 04/14/2022 FINAL  Final   Organism ID, Bacteria STENOTROPHOMONAS MALTOPHILIA  Final      Susceptibility   Stenotrophomonas maltophilia - MIC*    LEVOFLOXACIN 0.25 SENSITIVE Sensitive     TRIMETH/SULFA <=20 SENSITIVE Sensitive     * RARE STENOTROPHOMONAS MALTOPHILIA         Radiology Studies: No results found.      Scheduled Meds:  atorvastatin  20 mg Oral Daily   enoxaparin (LOVENOX) injection  30 mg Subcutaneous Q24H   famotidine  20 mg Oral BID   feeding supplement  237 mL Oral TID BM   ipratropium-albuterol  3 mL Nebulization BID   mometasone-formoterol  2 puff Inhalation BID   nicotine  14 mg Transdermal Daily   predniSONE  50 mg Oral Q breakfast   sertraline  50 mg Oral Daily   Continuous Infusions:   LOS: 7 days    Time spent: 35 minutes    Dorcas Carrow, MD [facility] Pager [phone]

## 2022-04-18 NOTE — TOC Progression Note (Addendum)
Transition of Care [facility]) - Progression Note    Patient Details  Name: Jaime Cline MRN: [id number] Date of Birth: 23-Feb-1961  Transition of Care [facility]) CM/SW Contact  Ross Ludwig, [place] Phone Number: 04/18/2022, 10:49 AM  Clinical Narrative:     CSW attempted to contact [facility] and [facility] to confirm they can accept patient under Medicaid.  Left a message awaiting for a call back, if SNF not Jaime to accept patient plan is to go home with hospice.  11:45am  CSW spoke to patient to discuss SNF options, he has agreed to [facility]).  CSW spoke to [place] at [facility]), she said they can accept patient pending insurance authorization.  Per Altha Harm, the facility will start insurance authorization.  Patient will need palliative to follow per MD.  CSW updated attending physician and bedside nurse.     Barriers to Discharge: Continued Medical Work up  Expected Discharge Plan and Services   In-house Referral: NA Discharge Planning Services: CM Consult Post Acute Care Choice: Durable Medical Equipment (RW with seat) Living arrangements for the past 2 months: Single Family Home                 DME Arranged: Walker rolling                     Social Determinants of Health (SDOH) Interventions    Readmission Risk Interventions    04/12/2022    4:02 PM 01/27/2022   12:27 PM  Readmission Risk Prevention Plan  Transportation Screening Complete Complete  PCP or Specialist Appt within 5-7 Days Complete Not Complete  Not Complete comments  new PCP made for 8/28  Home Care Screening Complete Complete  Medication Review (RN CM) Complete Complete

## 2022-04-18 NOTE — Progress Notes (Addendum)
Mobility Specialist - Progress Note  (RA) Pre-mobility: 91% SpO2 During mobility: 90% SpO2   04/18/22 1459  Mobility  Activity Ambulated with assistance in room  Level of Assistance Contact guard assist, steadying assist  Assistive Device Front wheel walker  Distance Ambulated (ft) 15 ft  Range of Motion/Exercises Active  Activity Response Tolerated well  Mobility Referral Yes  $Mobility charge 1 Mobility   Pt was found in bed and agreeable to ambulate. Had x1 standing rest break during ambulation and stated he still felt weak. Proceeded to sit EOB and used his non-rebreather mask for a couple of minutes before getting back in bed and was left with all necessities in reach.  Ferd Hibbs Mobility Specialist

## 2022-04-19 MED ORDER — TRAZODONE HCL 50 MG PO TABS: 50.0000 mg | ORAL_TABLET | Freq: Every evening | ORAL | Status: DC | PRN

## 2022-04-19 MED ORDER — IBUPROFEN 400 MG PO TABS: 400.0000 mg | ORAL_TABLET | Freq: Four times a day (QID) | ORAL | 0 refills | Status: DC | PRN

## 2022-04-19 MED ORDER — NICOTINE 14 MG/24HR TD PT24: 14.0000 mg | MEDICATED_PATCH | Freq: Every day | TRANSDERMAL | 0 refills | Status: DC

## 2022-04-19 MED ORDER — PREDNISONE 10 MG PO TABS: ORAL_TABLET | ORAL | 0 refills | Status: DC

## 2022-04-19 MED ORDER — IPRATROPIUM-ALBUTEROL 0.5-2.5 (3) MG/3ML IN SOLN: 3.0000 mL | Freq: Two times a day (BID) | RESPIRATORY_TRACT | Status: DC

## 2022-04-19 MED ORDER — GUAIFENESIN 100 MG/5ML PO LIQD: 10.0000 mL | Freq: Four times a day (QID) | ORAL | 0 refills | Status: DC | PRN

## 2022-04-19 NOTE — Progress Notes (Signed)
PROGRESS NOTE    COREYON NICOTRA  OXB:[id number] DOB: Dec 31, 1960 DOA: 04/10/2022 PCP: Pcp, No    Brief Narrative:  Severe COPD and recurrent hospitalization presented with a 1 week of worsening shortness of breath.  Found to have respiratory distress in the emergency room and started on BiPAP and admitted to stepdown unit with very advanced COPD, frequent exacerbations. Stabilizing.  From the SNF, awaiting insurance authorization.   Assessment & Plan:   COPD exacerbation, respiratory distress and acute hypoxemic respiratory failure: Presented with severe respiratory distress needing BiPAP support, hypoxemic, pH 7.2.  Improved with BiPAP use.  Continue aggressive bronchodilator therapy, changed to oral steroids.  Will treat with long-term steroid therapy. inhalational steroids, deep breathing exercises, incentive spirometry. Completed 5 days of doxycycline.  Respiratory culture with stenotrophomonas.  Likely upper airway normal flora. Supplemental oxygen to keep saturations more than 90%. No need to use BiPAP. Followed by palliative care.  Appreciate help.  Agreed for long-term palliative and hospice follow-up. Gets frequent episodes of chest tightness and bronchospasm, relieved with morphine drops. Will prescribe on discharge.  Severe protein calorie malnutrition and cachexia: BMI 13.  Nutrition following.  Palliative care as above.  Smoker: Counseled to quit.  He is ready to quit.  Nicotine patch.  Medically stable.  Transfer to a skilled nursing facility with palliative care follow-up when available.  He will be going to skilled nursing rehab with palliative care follow-up.  From the skilled nursing rehab he will go home with home hospice program for advanced COPD.   DVT prophylaxis: enoxaparin (LOVENOX) injection 30 mg Start: 04/11/22 1000   Code Status: DNR/DNI Family Communication: None today. Disposition Plan: Status is: Inpatient Remains inpatient appropriate because: Severe  COPD.  Needs SNF.  Stable for discharge.     Consultants:  Palliative care  Procedures:  None  Antimicrobials:  Doxycycline 10/23--- 10/20   Subjective: Patient seen and examined.  No overnight events.  Still gets episodic difficulties with difficulty clearing sputum.  Not wearing oxygen today.  Objective: Vitals:   04/18/22 1916 04/18/22 2153 04/19/22 0536 04/19/22 0732  BP:  110/83 104/70   Pulse:  95 88   Resp:  19 18   Temp:  98 F (36.7 C) 98 F (36.7 C)   TempSrc:  Oral Oral   SpO2: 99% 100% 100% 99%  Weight:      Height:        Intake/Output Summary (Last 24 hours) at 04/19/2022 1103 Last data filed at 04/19/2022 0800 Gross per 24 hour  Intake 690 ml  Output 1515 ml  Net -825 ml    Filed Weights   04/10/22 2205  Weight: 41.1 kg    Examination:  General exam: Calm and comfortable on room air.  Looks comfortable and able to talk in complete sentences.  Appropriately anxious. Respiratory system: Bilateral expiratory wheezes.  No crackles. Cardiovascular system: S1 & S2 heard, RRR. No pedal edema.  Cachectic legs. Gastrointestinal system: Abdomen is nondistended, soft and nontender. No organomegaly or masses felt. Normal bowel sounds heard. Central nervous system: Alert and oriented. No focal neurological deficits. Extremities: Symmetric 5 x 5 power. Skin: No rashes, lesions or ulcers    Data Reviewed: I have personally reviewed following labs and imaging studies  CBC: No results for input(s): "WBC", "NEUTROABS", "HGB", "HCT", "MCV", "PLT" in the last 168 hours.  Basic Metabolic Panel: No results for input(s): "NA", "K", "CL", "CO2", "GLUCOSE", "BUN", "CREATININE", "CALCIUM", "MG", "PHOS" in the last 168 hours.  GFR: Estimated Creatinine Clearance: 44.2 mL/min (by C-G formula based on SCr of 1.02 mg/dL). Liver Function Tests: No results for input(s): "AST", "ALT", "ALKPHOS", "BILITOT", "PROT", "ALBUMIN" in the last 168 hours.  No results for  input(s): "LIPASE", "AMYLASE" in the last 168 hours. No results for input(s): "AMMONIA" in the last 168 hours. Coagulation Profile: No results for input(s): "INR", "PROTIME" in the last 168 hours. Cardiac Enzymes: No results for input(s): "CKTOTAL", "CKMB", "CKMBINDEX", "TROPONINI" in the last 168 hours. BNP (last 3 results) No results for input(s): "PROBNP" in the last 8760 hours. HbA1C: No results for input(s): "HGBA1C" in the last 72 hours.  CBG: Recent Labs  Lab 04/15/22 1145 04/15/22 1635 04/15/22 2158 04/16/22 0738 04/16/22 1110  GLUCAP 126* 145* 174* 89 175*    Lipid Profile: No results for input(s): "CHOL", "HDL", "LDLCALC", "TRIG", "CHOLHDL", "LDLDIRECT" in the last 72 hours. Thyroid Function Tests: No results for input(s): "TSH", "T4TOTAL", "FREET4", "T3FREE", "THYROIDAB" in the last 72 hours. Anemia Panel: No results for input(s): "VITAMINB12", "FOLATE", "FERRITIN", "TIBC", "IRON", "RETICCTPCT" in the last 72 hours. Sepsis Labs: No results for input(s): "PROCALCITON", "LATICACIDVEN" in the last 168 hours.  Recent Results (from the past 240 hour(s))  Resp Panel by RT-PCR (Flu A&B, Covid) Anterior Nasal Swab     Status: None   Collection Time: 04/10/22  3:46 PM   Specimen: Anterior Nasal Swab  Result Value Ref Range Status   SARS Coronavirus 2 by RT PCR NEGATIVE NEGATIVE Final    Comment: (NOTE) SARS-CoV-2 target nucleic acids are NOT DETECTED.  The SARS-CoV-2 RNA is generally detectable in upper respiratory specimens during the acute phase of infection. The lowest concentration of SARS-CoV-2 viral copies this assay can detect is 138 copies/mL. A negative result does not preclude SARS-Cov-2 infection and should not be used as the sole basis for treatment or other patient management decisions. A negative result may occur with  improper specimen collection/handling, submission of specimen other than nasopharyngeal swab, presence of viral mutation(s) within  the areas targeted by this assay, and inadequate number of viral copies(<138 copies/mL). A negative result must be combined with clinical observations, patient history, and epidemiological information. The expected result is Negative.  Fact Sheet for Patients:  BloggerCourse.com  Fact Sheet for Healthcare Providers:  SeriousBroker.it  This test is no t yet approved or cleared by the [place] FDA and  has been authorized for detection and/or diagnosis of SARS-CoV-2 by FDA under an Emergency Use Authorization (EUA). This EUA will remain  in effect (meaning this test can be used) for the duration of the COVID-19 declaration under Section 564(b)(1) of the Act, 21 U.S.C.section 360bbb-3(b)(1), unless the authorization is terminated  or revoked sooner.       Influenza A by PCR NEGATIVE NEGATIVE Final   Influenza B by PCR NEGATIVE NEGATIVE Final    Comment: (NOTE) The Xpert Xpress SARS-CoV-2/FLU/RSV plus assay is intended as an aid in the diagnosis of influenza from Nasopharyngeal swab specimens and should not be used as a sole basis for treatment. Nasal washings and aspirates are unacceptable for Xpert Xpress SARS-CoV-2/FLU/RSV testing.  Fact Sheet for Patients: BloggerCourse.com  Fact Sheet for Healthcare Providers: SeriousBroker.it  This test is not yet approved or cleared by the [place] FDA and has been authorized for detection and/or diagnosis of SARS-CoV-2 by FDA under an Emergency Use Authorization (EUA). This EUA will remain in effect (meaning this test can be used) for the duration of the COVID-19 declaration under Section 564(b)(1)  of the Act, 21 U.S.C. section 360bbb-3(b)(1), unless the authorization is terminated or revoked.  Performed at [facility], [address], [place], [place] [address]   MRSA Next Gen by PCR, Nasal     Status:  None   Collection Time: 04/10/22  9:42 PM   Specimen: Nasal Mucosa; Nasal Swab  Result Value Ref Range Status   MRSA by PCR Next Gen NOT DETECTED NOT DETECTED Final    Comment: (NOTE) The GeneXpert MRSA Assay (FDA approved for NASAL specimens only), is one component of a comprehensive MRSA colonization surveillance program. It is not intended to diagnose MRSA infection nor to guide or monitor treatment for MRSA infections. Test performance is not FDA approved in patients less than 2 years old. Performed at [facility], [address] [place], [place] [address]   Expectorated Sputum Assessment w Gram Stain, Rflx to Resp Cult     Status: None   Collection Time: 04/11/22  5:02 PM   Specimen: Expectorated Sputum  Result Value Ref Range Status   Specimen Description EXPECTORATED SPUTUM  Final   Special Requests NONE  Final   Sputum evaluation   Final    Sputum specimen not acceptable for testing.  Please recollect.   Performed at [facility], [address], [place], [place] [address]    Report Status 04/11/2022 FINAL  Final  Expectorated Sputum Assessment w Gram Stain, Rflx to Resp Cult     Status: None   Collection Time: 04/11/22  8:11 PM   Specimen: Expectorated Sputum  Result Value Ref Range Status   Specimen Description EXPECTORATED SPUTUM  Final   Special Requests NONE  Final   Sputum evaluation   Final    THIS SPECIMEN IS ACCEPTABLE FOR SPUTUM CULTURE Performed at [facility], [address], [place], [place] [address]    Report Status 04/12/2022 FINAL  Final  Culture, Respiratory w Gram Stain     Status: None   Collection Time: 04/11/22  8:11 PM  Result Value Ref Range Status   Specimen Description   Final    EXPECTORATED SPUTUM Performed at [facility], [address] [place], [place] [address]    Special Requests   Final    NONE Reflexed from [id number] Performed at [facility], [address], [place], [place] [address]    Gram Stain   Final    RARE WBC PRESENT, PREDOMINANTLY PMN FEW GRAM POSITIVE COCCI IN PAIRS IN CHAINS RARE GRAM NEGATIVE RODS    Culture   Final    RARE STENOTROPHOMONAS MALTOPHILIA No Pseudomonas species isolated NO STAPHYLOCOCCUS AUREUS ISOLATED Performed at [facility], [address], [place], [place] [address]    Report Status 04/14/2022 FINAL  Final   Organism ID, Bacteria STENOTROPHOMONAS MALTOPHILIA  Final      Susceptibility   Stenotrophomonas maltophilia - MIC*    LEVOFLOXACIN 0.25 SENSITIVE Sensitive     TRIMETH/SULFA <=20 SENSITIVE Sensitive     * RARE STENOTROPHOMONAS MALTOPHILIA         Radiology Studies: No results found.      Scheduled Meds:  atorvastatin  20 mg Oral Daily   enoxaparin (LOVENOX) injection  30 mg Subcutaneous Q24H   famotidine  20 mg Oral BID   feeding supplement  237 mL Oral TID BM   ipratropium-albuterol  3 mL Nebulization BID   mometasone-formoterol  2 puff Inhalation BID   nicotine  14  mg Transdermal Daily   predniSONE  50 mg Oral Q breakfast   sertraline  50 mg Oral Daily   Continuous Infusions:   LOS: 8 days    Time spent: 35 minutes    Dorcas Carrow, MD [facility] Pager [phone]

## 2022-04-19 NOTE — Discharge Summary (Signed)
Physician Discharge Summary  Jaime Cline DTO:[id number] DOB: [birth year] DOA: 04/10/2022  PCP: Pcp, No  Admit date: 04/10/2022 Discharge date: 04/19/2022  Admitted From: Home Disposition: Skilled nursing facility  Recommendations for Outpatient Follow-up:  Consult palliative care  Home Health: N/A Equipment/Devices: N/A  Discharge Condition: Stable, fair CODE STATUS: DNR/DNI Diet recommendation: Regular diet, nutritional supplements  Discharge summary: Severe COPD and recurrent hospitalization presented with a 1 week of worsening shortness of breath.  Found to have respiratory distress in the emergency room and started on BiPAP and admitted to stepdown unit with very advanced COPD, frequent exacerbations.  Patient is stabilized now.  He will be going to a SNF and then ultimately will go home.  He will be enrolled into home hospice program for COPD after discharge from SNF.  He should be followed by palliative care at a SNF.    Assessment & plan of care:   COPD exacerbation, respiratory distress and acute hypoxemic respiratory failure: Presented with severe respiratory distress needing BiPAP support, hypoxemic, pH 7.2.  Improved with BiPAP use.  Continue aggressive bronchodilator therapy, changed to oral steroids.   Patient is on prednisone 50 mg daily now, will gradually taper off every 3 days and he can stay on prednisone 10 mg daily forever.  Continue inhalational steroids, deep breathing exercises, incentive spirometry. Completed 5 days of doxycycline.  Respiratory culture with stenotrophomonas.  Likely upper airway normal flora. Supplemental oxygen to keep saturations more than 90%. No more using BiPAP. Followed by palliative care.  Appreciate help.  Agreed for long-term palliative and hospice follow-up. Gets frequent episodes of chest tightness and bronchospasm, relieved with morphine . Will prescribe on discharge.   Severe protein calorie malnutrition and cachexia: BMI 13.   Nutrition following.  Palliative care as above.   Smoker: Counseled to quit.  He is ready to quit.  Nicotine patch.   Medically stable.  Transfer to a skilled nursing facility with palliative care follow-up. He will be going to skilled nursing rehab with palliative care follow-up.  From the skilled nursing rehab he will go home with home hospice program for advanced COPD.  Discharge Diagnoses:  Principal Problem:   COPD with acute exacerbation (HCC) Active Problems:   Smoker   Protein-calorie malnutrition, severe   Palliative care encounter   Goals of care, counseling/discussion   Counseling and coordination of care   Dyspnea    Discharge Instructions  Discharge Instructions     Call MD for:  difficulty breathing, headache or visual disturbances   Complete by: As directed    Diet general   Complete by: As directed    Discharge instructions   Complete by: As directed    Consult palliative care at SNF   Increase activity slowly   Complete by: As directed       Allergies as of 04/19/2022       Reactions   Other Other (See Comments)   BP med from [facility] about 3 years ago, caused some seizures, due to getting wrong BP med at that time.  Unknown name of med        Medication List     TAKE these medications    acetaminophen 500 MG tablet Commonly known as: TYLENOL Take 1,000 mg by mouth every 6 (six) hours as needed for mild pain.   Advair Diskus 100-50 MCG/ACT Aepb Generic drug: fluticasone-salmeterol Inhale 1 puff into the lungs 2 (two) times daily.   atorvastatin 20 MG tablet Commonly known as: LIPITOR Take  1 tablet (20 mg total) by mouth daily.   BC Fast Pain Relief 650-195-33.3 MG Pack Generic drug: Aspirin-Salicylamide-Caffeine Take 1 Package by mouth daily as needed (pain).   cholecalciferol 25 MCG (1000 UNIT) tablet Commonly known as: VITAMIN D3 Take 1,000 Units by mouth daily.   Ensure Take 237 mLs by mouth 3 (three) times daily between  meals.   famotidine 20 MG tablet Commonly known as: PEPCID Take 1 tablet (20 mg total) by mouth 2 (two) times daily. What changed: when to take this   guaiFENesin 100 MG/5ML liquid Commonly known as: ROBITUSSIN Take 10 mLs by mouth every 6 (six) hours as needed for cough or to loosen phlegm.   ibuprofen 400 MG tablet Commonly known as: ADVIL Take 1 tablet (400 mg total) by mouth every 6 (six) hours as needed for fever, headache or moderate pain.   ipratropium-albuterol 0.5-2.5 (3) MG/3ML Soln Commonly known as: DUONEB Take 3 mLs by nebulization 2 (two) times daily.   morphine CONCENTRATE 10 MG/0.5ML Soln concentrated solution Take 0.25 mLs (5 mg total) by mouth every 2 (two) hours as needed for severe pain, shortness of breath or anxiety.   nicotine 14 mg/24hr patch Commonly known as: NICODERM CQ - dosed in mg/24 hours Place 1 patch (14 mg total) onto the skin daily. Start taking on: April 20, 2022   predniSONE 10 MG tablet Commonly known as: DELTASONE 3 tabs daily for 3 days 2 tabs daily for 3 days  1 tabs daily to continue   sertraline 50 MG tablet Commonly known as: ZOLOFT Take 1 tablet (50 mg total) by mouth daily.   traZODone 50 MG tablet Commonly known as: DESYREL Take 1 tablet (50 mg total) by mouth at bedtime as needed for sleep.   albuterol (2.5 MG/3ML) 0.083% nebulizer solution Commonly known as: PROVENTIL Take 2.5 mg by nebulization every 8 (eight) hours as needed for wheezing or shortness of breath.   Ventolin HFA 108 (90 Base) MCG/ACT inhaler Generic drug: albuterol Inhale 1-2 puffs into the lungs every 6 (six) hours as needed for wheezing.               Durable Medical Equipment  (From admission, onward)           Start     Ordered   04/14/22 1621  For home use only DME Walker rolling  Once       Question Answer Comment  Walker: Other   Comments rollator   Patient needs a walker to treat with the following condition Gait abnormality       04/14/22 1620            Contact information for after-discharge care     Destination     [facility] .   Service: Skilled Nursing Contact information: [address] [place] [address] [phone]                    Allergies  Allergen Reactions   Other Other (See Comments)    BP med from [facility] about 3 years ago, caused some seizures, due to getting wrong BP med at that time.  Unknown name of med    Consultations: Palliative care   Procedures/Studies: DG Chest Port 1 View  Result Date: 04/10/2022 CLINICAL DATA:  Worsening shortness of breath. EXAM: PORTABLE CHEST 1 VIEW COMPARISON:  January 24, 2022 FINDINGS: The heart size and mediastinal contours are within normal limits. The lungs are hyperinflated  with stable emphysematous lung disease. Both lungs are otherwise clear. The visualized skeletal structures are unremarkable. IMPRESSION: COPD without evidence of acute or active cardiopulmonary disease. Electronically Signed   By: Aram Candela M.D.   On: 04/10/2022 21:34   (Echo, Carotid, EGD, Colonoscopy, ERCP)    Subjective: Patient seen and examined.  Eager to go to rehab.  Occasional difficulty with coughing and sputum expectoration otherwise no overnight events.  Mostly on room air.  On 2 L oxygen on mobility.   Discharge Exam: Vitals:   04/19/22 0732 04/19/22 1208  BP:  127/89  Pulse:  (!) 101  Resp:  16  Temp:  98.3 F (36.8 C)  SpO2: 99% 100%   Vitals:   04/18/22 2153 04/19/22 0536 04/19/22 0732 04/19/22 1208  BP: 110/83 104/70  127/89  Pulse: 95 88  (!) 101  Resp: Temp: 98 F (36.7 C) 98 F (36.7 C)  98.3 F (36.8 C)  TempSrc: Oral Oral  Oral  SpO2: 100% 100% 99% 100%  Weight:      Height:        General: Pt is alert, awake, mildly anxious.  Cachectic. Cardiovascular: RRR, S1/S2 +, no rubs, no gallops Respiratory: Bilateral poor air entry.  No added sounds. Abdominal:  Soft, NT, ND, bowel sounds + Extremities: no edema, no cyanosis    The results of significant diagnostics from this hospitalization (including imaging, microbiology, ancillary and laboratory) are listed below for reference.     Microbiology: Recent Results (from the past 240 hour(s))  Resp Panel by RT-PCR (Flu A&B, Covid) Anterior Nasal Swab     Status: None   Collection Time: 04/10/22  3:46 PM   Specimen: Anterior Nasal Swab  Result Value Ref Range Status   SARS Coronavirus 2 by RT PCR NEGATIVE NEGATIVE Final    Comment: (NOTE) SARS-CoV-2 target nucleic acids are NOT DETECTED.  The SARS-CoV-2 RNA is generally detectable in upper respiratory specimens during the acute phase of infection. The lowest concentration of SARS-CoV-2 viral copies this assay can detect is 138 copies/mL. A negative result does not preclude SARS-Cov-2 infection and should not be used as the sole basis for treatment or other patient management decisions. A negative result may occur with  improper specimen collection/handling, submission of specimen other than nasopharyngeal swab, presence of viral mutation(s) within the areas targeted by this assay, and inadequate number of viral copies(<138 copies/mL). A negative result must be combined with clinical observations, patient history, and epidemiological information. The expected result is Negative.  Fact Sheet for Patients:  BloggerCourse.com  Fact Sheet for Healthcare Providers:  SeriousBroker.it  This test is no t yet approved or cleared by the [place] FDA and  has been authorized for detection and/or diagnosis of SARS-CoV-2 by FDA under an Emergency Use Authorization (EUA). This EUA will remain  in effect (meaning this test can be used) for the duration of the COVID-19 declaration under Section 564(b)(1) of the Act, 21 U.S.C.section 360bbb-3(b)(1), unless the authorization is terminated  or  revoked sooner.       Influenza A by PCR NEGATIVE NEGATIVE Final   Influenza B by PCR NEGATIVE NEGATIVE Final    Comment: (NOTE) The Xpert Xpress SARS-CoV-2/FLU/RSV plus assay is intended as an aid in the diagnosis of influenza from Nasopharyngeal swab specimens and should not be used as a sole basis for treatment. Nasal washings and aspirates are unacceptable for Xpert Xpress SARS-CoV-2/FLU/RSV testing.  Fact Sheet for Patients: BloggerCourse.com  Fact Sheet for Healthcare Providers: IncredibleEmployment.be  This test is not yet approved or cleared by the [place] FDA and has been authorized for detection and/or diagnosis of SARS-CoV-2 by FDA under an Emergency Use Authorization (EUA). This EUA will remain in effect (meaning this test can be used) for the duration of the COVID-19 declaration under Section 564(b)(1) of the Act, 21 U.S.C. section 360bbb-3(b)(1), unless the authorization is terminated or revoked.  Performed at [facility], [place] [address], [place], [place] [address]   MRSA Next Gen by PCR, Nasal     Status: None   Collection Time: 04/10/22  9:42 PM   Specimen: Nasal Mucosa; Nasal Swab  Result Value Ref Range Status   MRSA by PCR Next Gen NOT DETECTED NOT DETECTED Final    Comment: (NOTE) The GeneXpert MRSA Assay (FDA approved for NASAL specimens only), is one component of a comprehensive MRSA colonization surveillance program. It is not intended to diagnose MRSA infection nor to guide or monitor treatment for MRSA infections. Test performance is not FDA approved in patients less than 37 years old. Performed at [facility], [place] [address], [place], [place] [address]   Expectorated Sputum Assessment w Gram Stain, Rflx to Resp Cult     Status: None   Collection Time: 04/11/22  5:02 PM   Specimen: Expectorated Sputum  Result Value Ref Range Status   Specimen Description  EXPECTORATED SPUTUM  Final   Special Requests NONE  Final   Sputum evaluation   Final    Sputum specimen not acceptable for testing.  Please recollect.   Performed at [facility], [place] [address], [place], [place] [address]    Report Status 04/11/2022 FINAL  Final  Expectorated Sputum Assessment w Gram Stain, Rflx to Resp Cult     Status: None   Collection Time: 04/11/22  8:11 PM   Specimen: Expectorated Sputum  Result Value Ref Range Status   Specimen Description EXPECTORATED SPUTUM  Final   Special Requests NONE  Final   Sputum evaluation   Final    THIS SPECIMEN IS ACCEPTABLE FOR SPUTUM CULTURE Performed at [facility], [place] [address] [place], [place] [address]    Report Status 04/12/2022 FINAL  Final  Culture, Respiratory w Gram Stain     Status: None   Collection Time: 04/11/22  8:11 PM  Result Value Ref Range Status   Specimen Description   Final    EXPECTORATED SPUTUM Performed at [facility], [place] [address], [place], [place] [address]    Special Requests   Final    NONE Reflexed from [id number] Performed at [facility], [place] [address], [place], [place] [address]    Gram Stain   Final    RARE WBC PRESENT, PREDOMINANTLY PMN FEW GRAM POSITIVE COCCI IN PAIRS IN CHAINS RARE GRAM NEGATIVE RODS    Culture   Final    RARE STENOTROPHOMONAS MALTOPHILIA No Pseudomonas species isolated NO STAPHYLOCOCCUS AUREUS ISOLATED Performed at [facility], [place] [address] [place], [place] [address]    Report Status 04/14/2022 FINAL  Final   Organism ID, Bacteria STENOTROPHOMONAS MALTOPHILIA  Final      Susceptibility   Stenotrophomonas maltophilia - MIC*    LEVOFLOXACIN 0.25 SENSITIVE Sensitive     TRIMETH/SULFA <=20 SENSITIVE Sensitive     * RARE STENOTROPHOMONAS MALTOPHILIA     Labs: BNP (last 3 results) Recent Labs    08/27/21 0801 09/26/21  0522 04/10/22 1811  BNP 102.9* 73.7 930.5*   Basic  Metabolic Panel: No results for input(s): "NA", "K", "CL", "CO2", "GLUCOSE", "BUN", "CREATININE", "CALCIUM", "MG", "PHOS" in the last 168 hours. Liver Function Tests: No results for input(s): "AST", "ALT", "ALKPHOS", "BILITOT", "PROT", "ALBUMIN" in the last 168 hours. No results for input(s): "LIPASE", "AMYLASE" in the last 168 hours. No results for input(s): "AMMONIA" in the last 168 hours. CBC: No results for input(s): "WBC", "NEUTROABS", "HGB", "HCT", "MCV", "PLT" in the last 168 hours. Cardiac Enzymes: No results for input(s): "CKTOTAL", "CKMB", "CKMBINDEX", "TROPONINI" in the last 168 hours. BNP: Invalid input(s): "POCBNP" CBG: Recent Labs  Lab 04/15/22 1145 04/15/22 1635 04/15/22 2158 04/16/22 0738 04/16/22 1110  GLUCAP 126* 145* 174* 89 175*   D-Dimer No results for input(s): "DDIMER" in the last 72 hours. Hgb A1c No results for input(s): "HGBA1C" in the last 72 hours. Lipid Profile No results for input(s): "CHOL", "HDL", "LDLCALC", "TRIG", "CHOLHDL", "LDLDIRECT" in the last 72 hours. Thyroid function studies No results for input(s): "TSH", "T4TOTAL", "T3FREE", "THYROIDAB" in the last 72 hours.  Invalid input(s): "FREET3" Anemia work up No results for input(s): "VITAMINB12", "FOLATE", "FERRITIN", "TIBC", "IRON", "RETICCTPCT" in the last 72 hours. Urinalysis    Component Value Date/Time   COLORURINE YELLOW 09/26/2021 1702   APPEARANCEUR CLEAR 09/26/2021 1702   LABSPEC 1.011 09/26/2021 1702   PHURINE 6.0 09/26/2021 1702   GLUCOSEU NEGATIVE 09/26/2021 1702   HGBUR SMALL (A) 09/26/2021 1702   BILIRUBINUR NEGATIVE 09/26/2021 1702   KETONESUR NEGATIVE 09/26/2021 1702   PROTEINUR NEGATIVE 09/26/2021 1702   UROBILINOGEN 0.2 03/27/2014 0158   NITRITE NEGATIVE 09/26/2021 1702   LEUKOCYTESUR NEGATIVE 09/26/2021 1702   Sepsis Labs No results for input(s): "WBC" in the last 168 hours.  Invalid input(s): "PROCALCITONIN", "LACTICIDVEN" Microbiology Recent Results (from  the past 240 hour(s))  Resp Panel by RT-PCR (Flu A&B, Covid) Anterior Nasal Swab     Status: None   Collection Time: 04/10/22  3:46 PM   Specimen: Anterior Nasal Swab  Result Value Ref Range Status   SARS Coronavirus 2 by RT PCR NEGATIVE NEGATIVE Final    Comment: (NOTE) SARS-CoV-2 target nucleic acids are NOT DETECTED.  The SARS-CoV-2 RNA is generally detectable in upper respiratory specimens during the acute phase of infection. The lowest concentration of SARS-CoV-2 viral copies this assay can detect is 138 copies/mL. A negative result does not preclude SARS-Cov-2 infection and should not be used as the sole basis for treatment or other patient management decisions. A negative result may occur with  improper specimen collection/handling, submission of specimen other than nasopharyngeal swab, presence of viral mutation(s) within the areas targeted by this assay, and inadequate number of viral copies(<138 copies/mL). A negative result must be combined with clinical observations, patient history, and epidemiological information. The expected result is Negative.  Fact Sheet for Patients:  BloggerCourse.comhttps://www.fda.gov/media/152166/download  Fact Sheet for Healthcare Providers:  SeriousBroker.ithttps://www.fda.gov/media/152162/download  This test is no t yet approved or cleared by the [place] FDA and  has been authorized for detection and/or diagnosis of SARS-CoV-2 by FDA under an Emergency Use Authorization (EUA). This EUA will remain  in effect (meaning this test can be used) for the duration of the COVID-19 declaration under Section 564(b)(1) of the Act, 21 U.S.C.section 360bbb-3(b)(1), unless the authorization is terminated  or revoked sooner.       Influenza A by PCR NEGATIVE NEGATIVE Final   Influenza B by PCR NEGATIVE NEGATIVE Final    Comment: (NOTE)  The Xpert Xpress SARS-CoV-2/FLU/RSV plus assay is intended as an aid in the diagnosis of influenza from Nasopharyngeal swab specimens  and should not be used as a sole basis for treatment. Nasal washings and aspirates are unacceptable for Xpert Xpress SARS-CoV-2/FLU/RSV testing.  Fact Sheet for Patients: BloggerCourse.com  Fact Sheet for Healthcare Providers: SeriousBroker.it  This test is not yet approved or cleared by the [place] FDA and has been authorized for detection and/or diagnosis of SARS-CoV-2 by FDA under an Emergency Use Authorization (EUA). This EUA will remain in effect (meaning this test can be used) for the duration of the COVID-19 declaration under Section 564(b)(1) of the Act, 21 U.S.C. section 360bbb-3(b)(1), unless the authorization is terminated or revoked.  Performed at [facility], [address], [place], [place] [address]   MRSA Next Gen by PCR, Nasal     Status: None   Collection Time: 04/10/22  9:42 PM   Specimen: Nasal Mucosa; Nasal Swab  Result Value Ref Range Status   MRSA by PCR Next Gen NOT DETECTED NOT DETECTED Final    Comment: (NOTE) The GeneXpert MRSA Assay (FDA approved for NASAL specimens only), is one component of a comprehensive MRSA colonization surveillance program. It is not intended to diagnose MRSA infection nor to guide or monitor treatment for MRSA infections. Test performance is not FDA approved in patients less than 57 years old. Performed at [facility], [address] [place], [place] [address]   Expectorated Sputum Assessment w Gram Stain, Rflx to Resp Cult     Status: None   Collection Time: 04/11/22  5:02 PM   Specimen: Expectorated Sputum  Result Value Ref Range Status   Specimen Description EXPECTORATED SPUTUM  Final   Special Requests NONE  Final   Sputum evaluation   Final    Sputum specimen not acceptable for testing.  Please recollect.   Performed at [facility], [address] [place], [place] [address]    Report Status  04/11/2022 FINAL  Final  Expectorated Sputum Assessment w Gram Stain, Rflx to Resp Cult     Status: None   Collection Time: 04/11/22  8:11 PM   Specimen: Expectorated Sputum  Result Value Ref Range Status   Specimen Description EXPECTORATED SPUTUM  Final   Special Requests NONE  Final   Sputum evaluation   Final    THIS SPECIMEN IS ACCEPTABLE FOR SPUTUM CULTURE Performed at [facility], [address], [place], [place] [address]    Report Status 04/12/2022 FINAL  Final  Culture, Respiratory w Gram Stain     Status: None   Collection Time: 04/11/22  8:11 PM  Result Value Ref Range Status   Specimen Description   Final    EXPECTORATED SPUTUM Performed at [facility], [address], [place], [place] [address]    Special Requests   Final    NONE Reflexed from [id number] Performed at [facility], [address], [place], [place] [address]    Gram Stain   Final    RARE WBC PRESENT, PREDOMINANTLY PMN FEW GRAM POSITIVE COCCI IN PAIRS IN CHAINS RARE GRAM NEGATIVE RODS    Culture   Final    RARE STENOTROPHOMONAS MALTOPHILIA No Pseudomonas species isolated NO STAPHYLOCOCCUS AUREUS ISOLATED Performed at [facility], [address], [place], [place] [address]    Report Status 04/14/2022 FINAL  Final   Organism ID, Bacteria STENOTROPHOMONAS MALTOPHILIA  Final  Susceptibility   Stenotrophomonas maltophilia - MIC*    LEVOFLOXACIN 0.25 SENSITIVE Sensitive     TRIMETH/SULFA <=20 SENSITIVE Sensitive     * RARE STENOTROPHOMONAS MALTOPHILIA     Time coordinating discharge: 35 minutes  SIGNED:   Dorcas Carrow, MD  [facility] 04/19/2022, 1:39 PM

## 2022-04-19 NOTE — Progress Notes (Signed)
RN attempts to give report to Hagerman at 1440, but had to leave my name and number for a later call back time. 1600, nurse was able to receive report. Await PTAR

## 2022-04-19 NOTE — TOC Transition Note (Signed)
Transition of Care [facility]) - CM/SW Discharge Note   Patient Details  Name: Jaime Cline MRN: [id number] Date of Birth: May 27, 1961  [facility] CM/SW Contact:  Leeroy Cha, RN Phone Number: 04/19/2022, 1:44 PM   Clinical Narrative:     Pt dcd to [place] via ptar.  Final next level of care: Skilled Nursing Facility Barriers to Discharge: Barriers Resolved   Patient Goals and CMS Choice Patient states their goals for this hospitalization and ongoing recovery are:: to go to rehab CMS Medicare.gov Compare Post Acute Care list provided to:: Patient Choice offered to / list presented to : Patient  Discharge Placement                       Discharge Plan and Services In-house Referral: NA Discharge Planning Services: CM Consult Post Acute Care Choice: [place]          DME Arranged: Walker rolling                    Social Determinants of Health (SDOH) Interventions     Readmission Risk Interventions   Row Labels 04/12/2022    4:02 PM 01/27/2022   12:27 PM  Readmission Risk Prevention Plan   Section Header. No data exists in this row.    Transportation Screening   Complete Complete  PCP or Specialist Appt within 5-7 Days   Complete Not Complete  Not Complete comments    new PCP made for 8/28  Home Care Screening   Complete Complete  Medication Review (RN CM)   Complete Complete

## 2022-08-15 ENCOUNTER — Other Ambulatory Visit (HOSPITAL_COMMUNITY): Payer: Self-pay

## 2022-08-17 ENCOUNTER — Inpatient Hospital Stay (HOSPITAL_COMMUNITY)
Admission: EM | Admit: 2022-08-17 | Discharge: 2022-08-20 | DRG: 871 | Disposition: A | Discharge status: Home Health | Payer: Medicaid Other | Attending: Internal Medicine | Admitting: Internal Medicine

## 2022-08-17 ENCOUNTER — Other Ambulatory Visit: Payer: Self-pay

## 2022-08-17 ENCOUNTER — Emergency Department (HOSPITAL_COMMUNITY): Payer: Medicaid Other

## 2022-08-17 DIAGNOSIS — Z1152 Encounter for screening for COVID-19: Secondary | ICD-10-CM

## 2022-08-17 DIAGNOSIS — A419 Sepsis, unspecified organism: Principal | ICD-10-CM

## 2022-08-17 DIAGNOSIS — K219 Gastro-esophageal reflux disease without esophagitis: Secondary | ICD-10-CM | POA: Diagnosis present

## 2022-08-17 DIAGNOSIS — E43 Unspecified severe protein-calorie malnutrition: Secondary | ICD-10-CM | POA: Diagnosis present

## 2022-08-17 DIAGNOSIS — R64 Cachexia: Secondary | ICD-10-CM | POA: Diagnosis present

## 2022-08-17 DIAGNOSIS — I2781 Cor pulmonale (chronic): Secondary | ICD-10-CM | POA: Diagnosis present

## 2022-08-17 DIAGNOSIS — J440 Chronic obstructive pulmonary disease with acute lower respiratory infection: Secondary | ICD-10-CM | POA: Diagnosis present

## 2022-08-17 DIAGNOSIS — F17210 Nicotine dependence, cigarettes, uncomplicated: Secondary | ICD-10-CM | POA: Diagnosis present

## 2022-08-17 DIAGNOSIS — Z66 Do not resuscitate: Secondary | ICD-10-CM | POA: Diagnosis present

## 2022-08-17 DIAGNOSIS — Z888 Allergy status to other drugs, medicaments and biological substances status: Secondary | ICD-10-CM | POA: Diagnosis not present

## 2022-08-17 DIAGNOSIS — I10 Essential (primary) hypertension: Secondary | ICD-10-CM | POA: Diagnosis present

## 2022-08-17 DIAGNOSIS — F32A Depression, unspecified: Secondary | ICD-10-CM | POA: Diagnosis present

## 2022-08-17 DIAGNOSIS — N179 Acute kidney failure, unspecified: Secondary | ICD-10-CM | POA: Diagnosis present

## 2022-08-17 DIAGNOSIS — K2970 Gastritis, unspecified, without bleeding: Secondary | ICD-10-CM | POA: Diagnosis present

## 2022-08-17 DIAGNOSIS — J189 Pneumonia, unspecified organism: Secondary | ICD-10-CM | POA: Diagnosis present

## 2022-08-17 DIAGNOSIS — Z681 Body mass index (BMI) 19 or less, adult: Secondary | ICD-10-CM

## 2022-08-17 DIAGNOSIS — R6520 Severe sepsis without septic shock: Secondary | ICD-10-CM | POA: Diagnosis present

## 2022-08-17 DIAGNOSIS — R1013 Epigastric pain: Secondary | ICD-10-CM | POA: Insufficient documentation

## 2022-08-17 DIAGNOSIS — E872 Acidosis, unspecified: Secondary | ICD-10-CM | POA: Diagnosis present

## 2022-08-17 DIAGNOSIS — Z7951 Long term (current) use of inhaled steroids: Secondary | ICD-10-CM | POA: Diagnosis not present

## 2022-08-17 DIAGNOSIS — R197 Diarrhea, unspecified: Secondary | ICD-10-CM | POA: Diagnosis present

## 2022-08-17 DIAGNOSIS — I2729 Other secondary pulmonary hypertension: Secondary | ICD-10-CM | POA: Diagnosis present

## 2022-08-17 DIAGNOSIS — Z79899 Other long term (current) drug therapy: Secondary | ICD-10-CM | POA: Diagnosis not present

## 2022-08-17 DIAGNOSIS — Z809 Family history of malignant neoplasm, unspecified: Secondary | ICD-10-CM | POA: Diagnosis not present

## 2022-08-17 DIAGNOSIS — J441 Chronic obstructive pulmonary disease with (acute) exacerbation: Secondary | ICD-10-CM | POA: Diagnosis present

## 2022-08-17 DIAGNOSIS — J9601 Acute respiratory failure with hypoxia: Secondary | ICD-10-CM | POA: Diagnosis not present

## 2022-08-17 DIAGNOSIS — E8729 Other acidosis: Secondary | ICD-10-CM | POA: Insufficient documentation

## 2022-08-17 DIAGNOSIS — Z8701 Personal history of pneumonia (recurrent): Secondary | ICD-10-CM

## 2022-08-17 LAB — CBC WITH DIFFERENTIAL/PLATELET
Abs Immature Granulocytes: 0.04 10*3/uL (ref 0.00–0.07)
Basophils Absolute: 0.1 10*3/uL (ref 0.0–0.1)
Basophils Relative: 0 %
Eosinophils Absolute: 0.1 10*3/uL (ref 0.0–0.5)
Eosinophils Relative: 1 %
HCT: 42 % (ref 39.0–52.0)
Hemoglobin: 14.3 g/dL (ref 13.0–17.0)
Immature Granulocytes: 0 %
Lymphocytes Relative: 12 %
Lymphs Abs: 1.4 10*3/uL (ref 0.7–4.0)
MCH: 32.3 pg (ref 26.0–34.0)
MCHC: 34 g/dL (ref 30.0–36.0)
MCV: 94.8 fL (ref 80.0–100.0)
Monocytes Absolute: 0.9 10*3/uL (ref 0.1–1.0)
Monocytes Relative: 7 %
Neutro Abs: 9.6 10*3/uL — ABNORMAL HIGH (ref 1.7–7.7)
Neutrophils Relative %: 80 %
Platelets: 280 10*3/uL (ref 150–400)
RBC: 4.43 MIL/uL (ref 4.22–5.81)
RDW: 17.5 % — ABNORMAL HIGH (ref 11.5–15.5)
WBC: 12 10*3/uL — ABNORMAL HIGH (ref 4.0–10.5)
nRBC: 0 % (ref 0.0–0.2)

## 2022-08-17 LAB — TROPONIN I (HIGH SENSITIVITY)
Troponin I (High Sensitivity): 7 ng/L (ref <18)
Troponin I (High Sensitivity): 4 ng/L (ref <18)

## 2022-08-17 LAB — URINALYSIS, W/ REFLEX TO CULTURE (INFECTION SUSPECTED)
Bilirubin Urine: NEGATIVE
Glucose, UA: NEGATIVE mg/dL
Hgb urine dipstick: NEGATIVE
Ketones, ur: NEGATIVE mg/dL
Leukocytes,Ua: NEGATIVE
Nitrite: NEGATIVE
Protein, ur: NEGATIVE mg/dL
Specific Gravity, Urine: <1.005 — ABNORMAL LOW (ref 1.005–1.030)
pH: 6 (ref 5.0–8.0)

## 2022-08-17 LAB — COMPREHENSIVE METABOLIC PANEL
ALT: 19 U/L (ref 0–44)
AST: 43 U/L — ABNORMAL HIGH (ref 15–41)
Albumin: 4.1 g/dL (ref 3.5–5.0)
Alkaline Phosphatase: 76 U/L (ref 38–126)
Anion gap: 15 (ref 5–15)
BUN: 15 mg/dL (ref 8–23)
CO2: 21 mmol/L — ABNORMAL LOW (ref 22–32)
Calcium: 9.3 mg/dL (ref 8.9–10.3)
Chloride: 96 mmol/L — ABNORMAL LOW (ref 98–111)
Creatinine, Ser: 1.88 mg/dL — ABNORMAL HIGH (ref 0.61–1.24)
GFR, Estimated: 40 mL/min — ABNORMAL LOW (ref >60)
Glucose, Bld: 99 mg/dL (ref 70–99)
Potassium: 4.2 mmol/L (ref 3.5–5.1)
Sodium: 132 mmol/L — ABNORMAL LOW (ref 135–145)
Total Bilirubin: 0.8 mg/dL (ref 0.3–1.2)
Total Protein: 7.5 g/dL (ref 6.5–8.1)

## 2022-08-17 LAB — PROTIME-INR
INR: 1 (ref 0.8–1.2)
Prothrombin Time: 13.3 seconds (ref 11.4–15.2)

## 2022-08-17 LAB — RESP PANEL BY RT-PCR (RSV, FLU A&B, COVID)  RVPGX2
Influenza A by PCR: NEGATIVE
Influenza B by PCR: NEGATIVE
Resp Syncytial Virus by PCR: NEGATIVE
SARS Coronavirus 2 by RT PCR: NEGATIVE

## 2022-08-17 LAB — LACTIC ACID, PLASMA
Lactic Acid, Venous: 3.5 mmol/L (ref 0.5–1.9)
Lactic Acid, Venous: 4.9 mmol/L (ref 0.5–1.9)

## 2022-08-17 LAB — BLOOD GAS, VENOUS
Acid-base deficit: 5.2 mmol/L — ABNORMAL HIGH (ref 0.0–2.0)
Bicarbonate: 22.7 mmol/L (ref 20.0–28.0)
O2 Saturation: 41.8 %
Patient temperature: 36.6
pCO2, Ven: 52 mmHg (ref 44–60)
pH, Ven: 7.25 (ref 7.25–7.43)
pO2, Ven: 34 mmHg (ref 32–45)

## 2022-08-17 LAB — BRAIN NATRIURETIC PEPTIDE: B Natriuretic Peptide: 37.2 pg/mL (ref 0.0–100.0)

## 2022-08-17 LAB — PROCALCITONIN: Procalcitonin: 0.42 ng/mL

## 2022-08-17 LAB — MRSA NEXT GEN BY PCR, NASAL: MRSA by PCR Next Gen: NOT DETECTED

## 2022-08-17 LAB — APTT: aPTT: 29 seconds (ref 24–36)

## 2022-08-17 MED ORDER — UMECLIDINIUM BROMIDE 62.5 MCG/ACT IN AEPB
1.0000 | INHALATION_SPRAY | Freq: Every day | RESPIRATORY_TRACT | Status: DC
  Administered 2022-08-17 – 2022-08-20 (×4): 1 via RESPIRATORY_TRACT
  Filled 2022-08-17: qty 7

## 2022-08-17 MED ORDER — ATORVASTATIN CALCIUM 10 MG PO TABS
20.0000 mg | ORAL_TABLET | Freq: Every day | ORAL | Status: DC
  Administered 2022-08-18 – 2022-08-20 (×3): 20 mg via ORAL
  Filled 2022-08-17 (×3): qty 2

## 2022-08-17 MED ORDER — ENOXAPARIN SODIUM 30 MG/0.3ML IJ SOSY
30.0000 mg | PREFILLED_SYRINGE | INTRAMUSCULAR | Status: DC
  Administered 2022-08-17 – 2022-08-19 (×3): 30 mg via SUBCUTANEOUS
  Filled 2022-08-17 (×3): qty 0.3

## 2022-08-17 MED ORDER — TRAZODONE HCL 50 MG PO TABS: 50.0000 mg | ORAL_TABLET | Freq: Every evening | ORAL | Status: DC | PRN

## 2022-08-17 MED ORDER — ACETAMINOPHEN 325 MG PO TABS
650.0000 mg | ORAL_TABLET | Freq: Four times a day (QID) | ORAL | Status: DC | PRN
  Administered 2022-08-20: 650 mg via ORAL
  Filled 2022-08-17: qty 2

## 2022-08-17 MED ORDER — LACTATED RINGERS IV BOLUS (SEPSIS)
250.0000 mL | Freq: Once | INTRAVENOUS | Status: AC
  Administered 2022-08-17: 250 mL via INTRAVENOUS

## 2022-08-17 MED ORDER — ONDANSETRON HCL 4 MG/2ML IJ SOLN: 4.0000 mg | Freq: Four times a day (QID) | INTRAMUSCULAR | Status: DC | PRN

## 2022-08-17 MED ORDER — ALUM & MAG HYDROXIDE-SIMETH 200-200-20 MG/5ML PO SUSP
30.0000 mL | Freq: Once | ORAL | Status: AC
  Administered 2022-08-17: 30 mL via ORAL
  Filled 2022-08-17: qty 30

## 2022-08-17 MED ORDER — LIDOCAINE VISCOUS HCL 2 % MT SOLN
15.0000 mL | Freq: Once | OROMUCOSAL | Status: AC
  Administered 2022-08-17: 15 mL via ORAL
  Filled 2022-08-17: qty 15

## 2022-08-17 MED ORDER — LACTATED RINGERS IV BOLUS (SEPSIS)
1000.0000 mL | Freq: Once | INTRAVENOUS | Status: AC
  Administered 2022-08-17: 1000 mL via INTRAVENOUS

## 2022-08-17 MED ORDER — MOMETASONE FURO-FORMOTEROL FUM 100-5 MCG/ACT IN AERO
2.0000 | INHALATION_SPRAY | Freq: Two times a day (BID) | RESPIRATORY_TRACT | Status: DC
  Administered 2022-08-17 – 2022-08-20 (×4): 2 via RESPIRATORY_TRACT
  Filled 2022-08-17: qty 8.8

## 2022-08-17 MED ORDER — SENNOSIDES-DOCUSATE SODIUM 8.6-50 MG PO TABS: 1.0000 | ORAL_TABLET | Freq: Every evening | ORAL | Status: DC | PRN

## 2022-08-17 MED ORDER — ENSURE ENLIVE PO LIQD
237.0000 mL | Freq: Two times a day (BID) | ORAL | Status: DC
  Administered 2022-08-18: 237 mL via ORAL

## 2022-08-17 MED ORDER — ONDANSETRON HCL 4 MG PO TABS: 4.0000 mg | ORAL_TABLET | Freq: Four times a day (QID) | ORAL | Status: DC | PRN

## 2022-08-17 MED ORDER — SODIUM CHLORIDE 0.9 % IV SOLN
500.0000 mg | INTRAVENOUS | Status: DC
  Administered 2022-08-17: 500 mg via INTRAVENOUS
  Filled 2022-08-17: qty 5

## 2022-08-17 MED ORDER — SODIUM CHLORIDE 0.9 % IV SOLN
1.0000 g | INTRAVENOUS | Status: DC
  Administered 2022-08-18: 1 g via INTRAVENOUS
  Filled 2022-08-17: qty 10

## 2022-08-17 MED ORDER — METHYLPREDNISOLONE SODIUM SUCC 125 MG IJ SOLR
125.0000 mg | Freq: Once | INTRAMUSCULAR | Status: AC
  Administered 2022-08-17: 125 mg via INTRAVENOUS
  Filled 2022-08-17: qty 2

## 2022-08-17 MED ORDER — LACTATED RINGERS IV BOLUS
1000.0000 mL | Freq: Once | INTRAVENOUS | Status: AC
  Administered 2022-08-17: 1000 mL via INTRAVENOUS

## 2022-08-17 MED ORDER — VANCOMYCIN HCL IN DEXTROSE 1-5 GM/200ML-% IV SOLN
1000.0000 mg | Freq: Once | INTRAVENOUS | Status: AC
  Administered 2022-08-17: 1000 mg via INTRAVENOUS
  Filled 2022-08-17: qty 200

## 2022-08-17 MED ORDER — LACTATED RINGERS IV BOLUS
1000.0000 mL | Freq: Once | INTRAVENOUS | Status: AC
  Administered 2022-08-18: 1000 mL via INTRAVENOUS

## 2022-08-17 MED ORDER — METRONIDAZOLE 500 MG/100ML IV SOLN
500.0000 mg | Freq: Once | INTRAVENOUS | Status: AC
  Administered 2022-08-17: 500 mg via INTRAVENOUS
  Filled 2022-08-17: qty 100

## 2022-08-17 MED ORDER — ACETAMINOPHEN 650 MG RE SUPP: 650.0000 mg | Freq: Four times a day (QID) | RECTAL | Status: DC | PRN

## 2022-08-17 MED ORDER — SODIUM CHLORIDE 0.9 % IV SOLN: 2.0000 g | INTRAVENOUS | Status: DC

## 2022-08-17 MED ORDER — PREDNISONE 20 MG PO TABS
40.0000 mg | ORAL_TABLET | Freq: Every day | ORAL | Status: DC
  Administered 2022-08-18 – 2022-08-20 (×3): 40 mg via ORAL
  Filled 2022-08-17 (×3): qty 2

## 2022-08-17 MED ORDER — SODIUM CHLORIDE 0.9 % IV SOLN
2.0000 g | Freq: Once | INTRAVENOUS | Status: AC
  Administered 2022-08-17: 2 g via INTRAVENOUS
  Filled 2022-08-17: qty 12.5

## 2022-08-17 MED ORDER — IPRATROPIUM-ALBUTEROL 0.5-2.5 (3) MG/3ML IN SOLN
3.0000 mL | Freq: Four times a day (QID) | RESPIRATORY_TRACT | Status: DC | PRN
  Administered 2022-08-19: 3 mL via RESPIRATORY_TRACT
  Filled 2022-08-17: qty 3

## 2022-08-17 MED ORDER — VANCOMYCIN HCL 750 MG/150ML IV SOLN: 750.0000 mg | INTRAVENOUS | Status: DC

## 2022-08-17 MED ORDER — PANTOPRAZOLE SODIUM 40 MG PO TBEC
40.0000 mg | DELAYED_RELEASE_TABLET | Freq: Every day | ORAL | Status: DC
  Administered 2022-08-17 – 2022-08-20 (×4): 40 mg via ORAL
  Filled 2022-08-17 (×4): qty 1

## 2022-08-17 MED ORDER — SERTRALINE HCL 50 MG PO TABS
50.0000 mg | ORAL_TABLET | Freq: Every day | ORAL | Status: DC
  Administered 2022-08-18 – 2022-08-20 (×3): 50 mg via ORAL
  Filled 2022-08-17 (×3): qty 1

## 2022-08-17 MED ORDER — LACTATED RINGERS IV SOLN: INTRAVENOUS | Status: AC

## 2022-08-17 NOTE — Progress Notes (Signed)
Date and time results received: 08/17/22 1744 (use smartphrase ".now" to insert current time)  Test: Lactic acid Critical Value: 3.5  Name of Provider Notified: Dr. Monica Becton  Orders Received? Or Actions Taken?:    Pt is receiving bolus at present.

## 2022-08-17 NOTE — ED Triage Notes (Addendum)
Pt BIB EMS due to [facility] and hypotension. Pt lives at home and is usually on 2L of O2 at home> Pt has been coughing up white frothy sputum. Pt having indigestion issues. Pt had duo nebs w EMS. Pt had '10mg'$  albuterol and 0.5 atrovent. Pt axox4.  EMS states BP was 70 systolic with them.

## 2022-08-17 NOTE — ED Notes (Signed)
ED TO INPATIENT HANDOFF REPORT  ED Nurse Name and Phone #: [phone]  S Name/Age/Gender Jaime Cline 62 y.o. male Room/Bed: 037C/037C  Code Status   Code Status: DNR  Home/SNF/Other Home Patient oriented to: self, place, time, and situation Is this baseline? Yes   Triage Complete: Triage complete  Chief Complaint Sepsis [facility]) [A41.9]  Triage Note Pt BIB EMS due to [facility] and hypotension. Pt lives at home and is usually on 2L of O2 at home> Pt has been coughing up white frothy sputum. Pt having indigestion issues. Pt had duo nebs w EMS. Pt had '10mg'$  albuterol and 0.5 atrovent. Pt axox4.  EMS states BP was 70 systolic with them.    Allergies Allergies  Allergen Reactions   Other Other (See Comments)    BP med from [facility] about 3 years ago, caused some seizures, due to getting wrong BP med at that time.  Unknown name of med    Level of Care/Admitting Diagnosis ED Disposition     ED Disposition  Admit   Condition  --   [place]: [place] [[id number]]  Level of Care: Telemetry Medical [104]  May admit patient to [facility] or [facility] if equivalent level of care is available:: No  Covid Evaluation: Confirmed COVID Negative  Diagnosis: Sepsis [facility]) [id number]  Admitting Physician: Charise Killian [id number]  Attending Physician: Charise Killian [id number]  Certification:: I certify this patient will need inpatient services for at least 2 midnights  Estimated Length of Stay: 2          B Medical/Surgery History Past Medical History:  Diagnosis Date   Alcohol abuse    Asthma    Bronchitis    Chronic back pain    Chronic knee pain    Hypertension    No past surgical history on file.   A IV Location/Drains/Wounds Patient Lines/Drains/Airways Status     Active Line/Drains/Airways     Name Placement date Placement time Site Days   Peripheral IV 08/17/22 22 G Anterior;Distal;Right;Upper Arm 08/17/22  1033  Arm  less than 1   Peripheral  IV 08/17/22 20 G 1.25" Left Antecubital 08/17/22  1222  Antecubital  less than 1            Intake/Output Last 24 hours No intake or output data in the 24 hours ending 08/17/22 1413  Labs/Imaging Results for orders placed or performed during the hospital encounter of 08/17/22 (from the past 48 hour(s))  Resp panel by RT-PCR (RSV, Flu A&B, Covid) Anterior Nasal Swab     Status: None   Collection Time: 08/17/22 10:34 AM   Specimen: Anterior Nasal Swab  Result Value Ref Range   SARS Coronavirus 2 by RT PCR NEGATIVE NEGATIVE   Influenza A by PCR NEGATIVE NEGATIVE   Influenza B by PCR NEGATIVE NEGATIVE    Comment: (NOTE) The Xpert Xpress SARS-CoV-2/FLU/RSV plus assay is intended as an aid in the diagnosis of influenza from Nasopharyngeal swab specimens and should not be used as a sole basis for treatment. Nasal washings and aspirates are unacceptable for Xpert Xpress SARS-CoV-2/FLU/RSV testing.  Fact Sheet for Patients: EntrepreneurPulse.com.au  Fact Sheet for Healthcare Providers: IncredibleEmployment.be  This test is not yet approved or cleared by the [place] FDA and has been authorized for detection and/or diagnosis of SARS-CoV-2 by FDA under an Emergency Use Authorization (EUA). This EUA will remain in effect (meaning this test can be used) for the duration of the COVID-19 declaration  under Section 564(b)(1) of the Act, 21 U.S.C. section 360bbb-3(b)(1), unless the authorization is terminated or revoked.     Resp Syncytial Virus by PCR NEGATIVE NEGATIVE    Comment: (NOTE) Fact Sheet for Patients: EntrepreneurPulse.com.au  Fact Sheet for Healthcare Providers: IncredibleEmployment.be  This test is not yet approved or cleared by the [place] FDA and has been authorized for detection and/or diagnosis of SARS-CoV-2 by FDA under an Emergency Use Authorization (EUA). This EUA will remain in  effect (meaning this test can be used) for the duration of the COVID-19 declaration under Section 564(b)(1) of the Act, 21 U.S.C. section 360bbb-3(b)(1), unless the authorization is terminated or revoked.  Performed at [facility], [place] [address], [place], [place] [address]   Comprehensive metabolic panel     Status: Abnormal   Collection Time: 08/17/22 10:34 AM  Result Value Ref Range   Sodium 132 (L) 135 - 145 mmol/L   Potassium 4.2 3.5 - 5.1 mmol/L    Comment: HEMOLYSIS AT THIS LEVEL MAY AFFECT RESULT   Chloride 96 (L) 98 - 111 mmol/L   CO2 21 (L) 22 - 32 mmol/L   Glucose, Bld 99 70 - 99 mg/dL    Comment: Glucose reference range applies only to samples taken after fasting for at least 8 hours.   BUN 15 8 - 23 mg/dL   Creatinine, Ser 1.88 (H) 0.61 - 1.24 mg/dL   Calcium 9.3 8.9 - 10.3 mg/dL   Total Protein 7.5 6.5 - 8.1 g/dL   Albumin 4.1 3.5 - 5.0 g/dL   AST 43 (H) 15 - 41 U/L    Comment: HEMOLYSIS AT THIS LEVEL MAY AFFECT RESULT   ALT 19 0 - 44 U/L    Comment: HEMOLYSIS AT THIS LEVEL MAY AFFECT RESULT   Alkaline Phosphatase 76 38 - 126 U/L   Total Bilirubin 0.8 0.3 - 1.2 mg/dL    Comment: HEMOLYSIS AT THIS LEVEL MAY AFFECT RESULT   GFR, Estimated 40 (L) >60 mL/min    Comment: (NOTE) Calculated using the CKD-EPI Creatinine Equation (2021)    Anion gap 15 5 - 15    Comment: Performed at [facility], [place] [address] [place], [place] [address]  CBC with Differential     Status: Abnormal   Collection Time: 08/17/22 10:34 AM  Result Value Ref Range   WBC 12.0 (H) 4.0 - 10.5 K/uL   RBC 4.43 4.22 - 5.81 MIL/uL   Hemoglobin 14.3 13.0 - 17.0 g/dL   HCT 42.0 39.0 - 52.0 %   MCV 94.8 80.0 - 100.0 fL   MCH 32.3 26.0 - 34.0 pg   MCHC 34.0 30.0 - 36.0 g/dL   RDW 17.5 (H) 11.5 - 15.5 %   Platelets 280 150 - 400 K/uL   nRBC 0.0 0.0 - 0.2 %   Neutrophils Relative % 80 %   Neutro Abs 9.6 (H) 1.7 - 7.7 K/uL   Lymphocytes Relative 12 %   Lymphs Abs 1.4 0.7 - 4.0 K/uL    Monocytes Relative 7 %   Monocytes Absolute 0.9 0.1 - 1.0 K/uL   Eosinophils Relative 1 %   Eosinophils Absolute 0.1 0.0 - 0.5 K/uL   Basophils Relative 0 %   Basophils Absolute 0.1 0.0 - 0.1 K/uL   Immature Granulocytes 0 %   Abs Immature Granulocytes 0.04 0.00 - 0.07 K/uL    Comment: Performed at [facility] [address] [place], [place] [address]  Protime-INR  Status: None   Collection Time: 08/17/22 10:34 AM  Result Value Ref Range   Prothrombin Time 13.3 11.4 - 15.2 seconds   INR 1.0 0.8 - 1.2    Comment: (NOTE) INR goal varies based on device and disease states. Performed at [facility], [place] [address], [place], [place] [address]   APTT     Status: None   Collection Time: 08/17/22 10:34 AM  Result Value Ref Range   aPTT 29 24 - 36 seconds    Comment: Performed at [facility] [address] [place], [place] [address]  Troponin I (High Sensitivity)     Status: None   Collection Time: 08/17/22 10:34 AM  Result Value Ref Range   Troponin I (High Sensitivity) 7 <18 ng/L    Comment: (NOTE) Elevated high sensitivity troponin I (hsTnI) values and significant  changes across serial measurements may suggest ACS but many other  chronic and acute conditions are known to elevate hsTnI results.  Refer to the "Links" section for chest pain algorithms and additional  guidance. Performed at [facility], [place] [address], [place], [place] [address]    DG Chest Port 1 View  Result Date: 08/17/2022 CLINICAL DATA:  Questionable sepsis. EXAM: PORTABLE CHEST 1 VIEW COMPARISON:  04/10/2022. FINDINGS: 1054 hours. Hyperexpanded lungs with prominent interstitial markings, unchanged and consistent with emphysema. Few nodular opacities in the left lower lung may reflect developing infection or aspiration. Stable cardiac and mediastinal contours. No pleural effusion or pneumothorax. IMPRESSION: 1. Few nodular opacities in the left lower lung may reflect  developing infection or aspiration. Follow-up to resolution is recommended. 2.  Emphysema (ICD10-J43.9). Electronically Signed   By: Emmit [place]ers M.D.   On: 08/17/2022 11:06    Pending Labs Unresulted Labs (From admission, onward)     Start     Ordered   08/17/22 1327  Blood gas, venous  Once,   R       Question:  Specimen collection method  Answer:  IV Team=IV Team collect   08/17/22 1326   08/17/22 1313  MRSA Next Gen by PCR, Nasal  (MRSA Screening)  Once,   URGENT        08/17/22 1312   08/17/22 1034  Lactic acid, plasma  (Septic presentation on arrival (screening labs, nursing and treatment orders for obvious sepsis))  Now then every 2 hours,   R (with STAT occurrences)      08/17/22 1035   08/17/22 1034  Blood Culture (routine x 2)  (Septic presentation on arrival (screening labs, nursing and treatment orders for obvious sepsis))  BLOOD CULTURE X 2,   STAT      08/17/22 1035   08/17/22 1034  Brain natriuretic peptide  (Septic presentation on arrival (screening labs, nursing and treatment orders for obvious sepsis))  ONCE - URGENT,   URGENT        08/17/22 1035   08/17/22 1034  Urinalysis, w/ Reflex to Culture (Infection Suspected) -Urine, Clean Catch  (Septic presentation on arrival (screening labs, nursing and treatment orders for obvious sepsis))  Once,   URGENT       Question:  Specimen Source  Answer:  Urine, Clean Catch   08/17/22 1035            Vitals/Pain Today's Vitals   08/17/22 1130 08/17/22 1145 08/17/22 1330 08/17/22 1342  BP: (!) '80/58 98/62 99/74 '$ 95/71  Pulse: 86 76 88 88  Resp: (!) 23 19  19  Temp:    (!) 97.5 F (36.4 C)  TempSrc:    Oral  SpO2: 100% 98% 100% 100%  Weight:      Height:      PainSc:        Isolation Precautions No active isolations  Medications Medications  lactated ringers infusion (has no administration in time range)  ceFEPIme (MAXIPIME) 2 g in sodium chloride 0.9 % 100 mL IVPB (has no administration in time range)   vancomycin (VANCOREADY) IVPB 750 mg/150 mL (has no administration in time range)  lactated ringers bolus 1,000 mL (has no administration in time range)  enoxaparin (LOVENOX) injection 30 mg (has no administration in time range)  acetaminophen (TYLENOL) tablet 650 mg (has no administration in time range)    Or  acetaminophen (TYLENOL) suppository 650 mg (has no administration in time range)  senna-docusate (Senokot-S) tablet 1 tablet (has no administration in time range)  ondansetron (ZOFRAN) tablet 4 mg (has no administration in time range)    Or  ondansetron (ZOFRAN) injection 4 mg (has no administration in time range)  atorvastatin (LIPITOR) tablet 20 mg (has no administration in time range)  mometasone-formoterol (DULERA) 100-5 MCG/ACT inhaler 2 puff (has no administration in time range)  sertraline (ZOLOFT) tablet 50 mg (has no administration in time range)  traZODone (DESYREL) tablet 50 mg (has no administration in time range)  lactated ringers bolus 1,000 mL (0 mLs Intravenous Stopped 08/17/22 1200)    And  lactated ringers bolus 250 mL (0 mLs Intravenous Stopped 08/17/22 1339)  ceFEPIme (MAXIPIME) 2 g in sodium chloride 0.9 % 100 mL IVPB (0 g Intravenous Stopped 08/17/22 1240)  metroNIDAZOLE (FLAGYL) IVPB 500 mg (0 mg Intravenous Stopped 08/17/22 1236)  vancomycin (VANCOCIN) IVPB 1000 mg/200 mL premix (1,000 mg Intravenous New Bag/Given 08/17/22 1248)  methylPREDNISolone sodium succinate (SOLU-MEDROL) 125 mg/2 mL injection 125 mg (125 mg Intravenous Given 08/17/22 1236)  alum & mag hydroxide-simeth (MAALOX/MYLANTA) 200-200-20 MG/5ML suspension 30 mL (30 mLs Oral Given 08/17/22 1234)    And  lidocaine (XYLOCAINE) 2 % viscous mouth solution 15 mL (15 mLs Oral Given 08/17/22 1234)    Mobility walks     Focused Assessments Cardiac Assessment Handoff:    Lab Results  Component Value Date   CKTOTAL 75 01/31/2011   CKMB 2.1 01/31/2011   TROPONINI <0.30 12/19/2013   Lab Results   Component Value Date   DDIMER 0.92 (H) 01/25/2022   Does the Patient currently have chest pain? No   , Renal Assessment Handoff:       , Pulmonary Assessment Handoff:  Lung sounds: L Breath Sounds: Rhonchi R Breath Sounds: Rhonchi O2 Device: Nasal Cannula O2 Flow Rate (L/min): 3 L/min    R Recommendations: See Admitting Provider Note  Report given to:   Additional Notes:

## 2022-08-17 NOTE — Plan of Care (Signed)
  Problem: Education: Goal: Knowledge of General Education information will improve Description: Including pain rating scale, medication(s)/side effects and non-pharmacologic comfort measures Outcome: Progressing   Problem: Health Behavior/Discharge Planning: Goal: Ability to manage health-related needs will improve Outcome: Progressing   Problem: Clinical Measurements: Goal: Ability to maintain clinical measurements within normal limits will improve Outcome: Progressing   Problem: Activity: Goal: Risk for activity intolerance will decrease Outcome: Progressing   Problem: Nutrition: Goal: Adequate nutrition will be maintained Outcome: Progressing   Problem: Coping: Goal: Level of anxiety will decrease Outcome: Progressing   Problem: Elimination: Goal: Will not experience complications related to bowel motility Outcome: Progressing Goal: Will not experience complications related to urinary retention Outcome: Progressing   

## 2022-08-17 NOTE — ED Notes (Signed)
RN unable to obtain blood cultures. IV team order placed.

## 2022-08-17 NOTE — H&P (Signed)
Date: 08/17/2022               Patient Name:  Jaime Cline MRN: [id number]  DOB: August 10, 1960 Age / Sex: 62 y.o., male   PCP: Pcp, No         Medical Service: Internal Medicine Teaching Service         Attending Physician: Dr. Charise Killian, MD    First Contact: Dr. Vena Rua, MD Pager: [phone]  Second Contact: Dr. Delene Ruffini, MD Pager: [phone]       After Hours (After 5p/  First Contact Pager: [phone]  weekends / holidays): Second Contact Pager: [phone]   Chief Complaint: SOB  History of Present Illness: Jaime Cline is a 62 year old with a history of being COPD complicated by pulmonary hypertension and right heart dysfunction, gastritis and HTN who presented to the ED for evaluation of shortness of breath. Patient called EMS due to worsening shortness of breath and was found to have SBP in the 70s and SpO2 also in the 70s. Patient reports over the last few days, he has had increased cough and shortness of breath with occasional wheezing. He reports a productive cough initially of green sputum but now whitish.  Last night, he could not catch his breath after using his Advair, albuterol inhaler and nebulizer treatments multiple times. He also reports feeling dizzy after getting up from bed and reports feeling feverish, chills, acid reflux and having watery stools 5-6 times a day for the past 3 days. He denies any chest pain, headaches, vision changes, N/V, abdominal pain, dysuria or sick contact.  ED course: Found to be tachypneic with RR in 20s, tachycardic w/ HR 90s-101, hypotensive with SBP in 60-70s, hypoxic requiring 3 L [place] and WBC of 12.0. CXR showed opacities in the LLL concerning for infection or aspiration. Code sepsis was called with initiation of vancomycin, cefepime, Flagyl, Solu-Medrol and IV fluids.  IMTS consulted for admission  Meds:  No outpatient medications have been marked as taking for the 08/17/22 encounter [facility] Encounter).  Atorvastatin 20 mg  daily Sertraline 50 mg daily Trazodone 50 mg as needed for sleep As needed albuterol inhaler Daily Advair inhaler  Allergies: Allergies as of 08/17/2022 - Review Complete 08/17/2022  Allergen Reaction Noted   Other Other (See Comments) 09/06/2013   Past Medical History:  Diagnosis Date   Alcohol abuse    Asthma    Bronchitis    Chronic back pain    Chronic knee pain    Hypertension     Family History:  Family History  Problem Relation Age of Onset   Cancer Mother    Cancer Sister     Social History: Currently lives with his sister. On disability but used to work as a Training and development officer at [employer].  Mostly independent with ADLs but gets help his sister.  Smoked 1/2 ppd for about 30 years but currently smokes 1 pack/week.  Drinks occasionally, 1 pack of beer lasting 1.5 weeks.  Denies any illicit drug use.  Review of Systems: A complete ROS was negative except as per HPI.   Physical Exam: Blood pressure 104/64, pulse 91, temperature 97.8 F (36.6 C), temperature source Oral, resp. rate 18, height '5\' 10"'$  (1.778 m), weight 40.8 kg, SpO2 100 %.  General: Chronically ill, cachectic elderly man laying in bed. No acute distress. HEENT: Bitemporal wasting. Dry mucous membrane.  CV: Tachycardic. Regular rhythm. No murmurs, rubs, or gallops. No LE edema Pulmonary: On 2 LNC. Lungs CTAB. Poor  air movement throughout with coarse lung sounds. Mild expiratory wheezes in the upper lung fields. No rales.  Abdominal: Soft, nontender, nondistended. Normal bowel sounds. Extremities: Palpable pulses. Normal ROM. Skin: Warm and dry. No obvious rash or lesions. Decreased skin turgor. Neuro: A&Ox3. Moves all extremities. Normal sensation. No focal deficit. Psych: Normal mood and affect  EKG: personally reviewed my interpretation is sinus rhythm with HR of 94 and right axis deviation  DG Chest Port 1 View  IMPRESSION: 1. Few nodular opacities in the left lower lung may reflect developing infection or  aspiration. Follow-up to resolution is recommended. 2.  Emphysema (ICD10-J43.9).    Assessment & Plan by Problem: Principal Problem:   Sepsis [facility])  Jaime Cline is a 62 year old with a history of being COPD complicated by pulmonary hypertension and right heart dysfunction, gastritis and HTN who presented via EMS for evaluation of shortness of breath and admitted for sepsis secondary to pneumonia.   #Sepsis #Community-acquired pneumonia Patient with a history of COPD and multiple hospitalizations for COPD exacerbation presenting for shortness of breath. Patient found to be hypoxic, tachycardic, tachypneic and hypotension with mild leukocytosis and signs of pneumonia on imaging. Presentation consistent with sepsis from his pulmonary infection. Lactate elevated to 3.5, troponins neg x1, UA negative, BNP 37.2, VBG unremarkable, RVP negative. Status post 1 dose of vancomycin, cefepime and Flagyl in the ED and 2.25 L of IVF. Patient's BP remains soft with SBP in the 90s to 100s but with normal mentation.  Patient remained alert and oriented x 4 and comfortable on 2 L [place].  On chart review, patient has baseline SBP in the 100s.  -Start Rocephin and azithromycin -Discontinue cefepime -Follow-up MRSA screen, discontinue Vanc if negative -IV LR 150 cc/hr, with IVF boluses to keep MAP >65 -F/u procalcitonin, respiratory culture -F/u blood culture -Trend CBC, lactate and fever curve  #COPD exacerbation Patient has history of end-stage COPD with multiple hospital hospitalizations for COPD over the last year.  Reports he has an oxygen tank at home but does not use it daily.  Over the last few days, he has had increased shortness of breath, wheezing, and cough that is productive.  He tried his inhalers and nebulizer treatment without significant improvement. On exam, patient with mild expiratory wheezes but comfortable appearing on 2 L [place]. S/p Solu-Medrol 125 mg x 1 in the ER -Prednisone 40 mg daily -Daily  Spiriva and Dulera -As needed DuoNebs -O2 supplementation, wean for O2 goal 88-92%  #AKI Found to have a creatinine of 1.88 on admission from a baseline of normal kidney function. AKI secondary to hypotension in the setting of poor p.o. intake and acute pulmonary infection.-Patient also reports patient also reports having diarrhea for the past 3 days. S/p 2250 cc of IVF in the ED. -IVLR 150 cc/hr -Check GI panel -Trend BMP  #Severe protein malnutrition Patient with pulmonary cachexia with reported poor p.o. intake at home due to food being stuck in his throat.  BMI of 12.1. Patient has been gradually losing weight since last year due to poor p.o. intake and likely end-stage COPD. Reports he has never weighed more than 120 lbs. During his last hospitalization 3-4 months ago, palliative care was consulted and patient was discharged with plan for palliative care follow-up in the outpatient.  Patient reports someone called him but he no one has come to the house since he left the SNF. -SLP consult -RD consult -Ensure for protein supplementation between meals -Consider re-engaging palliative care prior to  discharge  #Depression -Sertraline 50 mg daily -Trazodone 50 mg as needed for sleep  #Gastritis Patient reported having some acid reflux the last few days. He was previously on famotidine 20 mg daily but reports he does not have a PCP and does not have any more refills. -Protonix 40 mg daily  CODE STATUS: DNR DIET: Regular PPx: Lovenox  Dispo: Admit patient to Observation with expected length of stay less than 2 midnights.  Signed: Lacinda Axon, MD 08/17/2022, 3:24 PM  Pager: [phone] Internal Medicine Teaching Service After 5pm on weekdays and 1pm on weekends: On Call pager: [phone]

## 2022-08-17 NOTE — ED Provider Notes (Signed)
[facility] Provider Note   CSN: [id number] Arrival date & time: 08/17/22  1008     History  Chief Complaint  Patient presents with   Shortness of Breath    Jaime Cline is a 62 y.o. male.  The history is provided by the patient. No language interpreter was used.  Shortness of Breath Severity:  Severe Onset quality:  Gradual Duration:  3 days Timing:  Constant Progression:  Waxing and waning Chronicity:  New Context: URI   Worsened by:  Coughing Associated symptoms: cough, fever (subjective), sputum production and wheezing   Associated symptoms: no abdominal pain, no chest pain, no headaches, no neck pain, no rash and no vomiting   Risk factors: no hx of PE/DVT        Home Medications Prior to Admission medications   Medication Sig Start Date End Date Taking? Authorizing Provider  acetaminophen (TYLENOL) 500 MG tablet Take 1,000 mg by mouth every 6 (six) hours as needed for mild pain.    [provider]  albuterol (PROVENTIL) (2.5 MG/3ML) 0.083% nebulizer solution Take 2.5 mg by nebulization every 8 (eight) hours as needed for wheezing or shortness of breath.    [provider]  albuterol (VENTOLIN HFA) 108 (90 Base) MCG/ACT inhaler Inhale 1-2 puffs into the lungs every 6 (six) hours as needed for wheezing. 01/27/22   Damita Lack, MD  Aspirin-Salicylamide-Caffeine (BC FAST PAIN RELIEF) 650-195-33.3 MG PACK Take 1 Package by mouth daily as needed (pain).    [provider]  atorvastatin (LIPITOR) 20 MG tablet Take 1 tablet (20 mg total) by mouth daily. 01/27/22   Amin, Jeanella Flattery, MD  cholecalciferol (VITAMIN D3) 25 MCG (1000 UNIT) tablet Take 1,000 Units by mouth daily.    [provider]  Ensure (ENSURE) Take 237 mLs by mouth 3 (three) times daily between meals.    [provider]  famotidine (PEPCID) 20 MG tablet Take 1 tablet (20 mg total) by mouth 2 (two) times  daily. Patient taking differently: Take 20 mg by mouth daily. 01/27/22   Amin, Jeanella Flattery, MD  fluticasone-salmeterol (ADVAIR) 100-50 MCG/ACT AEPB Inhale 1 puff into the lungs 2 (two) times daily. 01/27/22   Amin, Jeanella Flattery, MD  guaiFENesin (ROBITUSSIN) 100 MG/5ML liquid Take 10 mLs by mouth every 6 (six) hours as needed for cough or to loosen phlegm. 04/19/22   Barb Merino, MD  ibuprofen (ADVIL) 400 MG tablet Take 1 tablet (400 mg total) by mouth every 6 (six) hours as needed for fever, headache or moderate pain. 04/19/22   Barb Merino, MD  ipratropium-albuterol (DUONEB) 0.5-2.5 (3) MG/3ML SOLN Take 3 mLs by nebulization 2 (two) times daily. 04/19/22   Barb Merino, MD  Morphine Sulfate (MORPHINE CONCENTRATE) 10 MG/0.5ML SOLN concentrated solution Take 0.25 mLs (5 mg total) by mouth every 2 (two) hours as needed for severe pain, shortness of breath or anxiety. 04/14/22   Barb Merino, MD  nicotine (NICODERM CQ - DOSED IN MG/24 HOURS) 14 mg/24hr patch Place 1 patch (14 mg total) onto the skin daily. 04/20/22   Barb Merino, MD  predniSONE (DELTASONE) 10 MG tablet 3 tabs daily for 3 days 2 tabs daily for 3 days  1 tabs daily to continue 04/19/22   Barb Merino, MD  sertraline (ZOLOFT) 50 MG tablet Take 1 tablet (50 mg total) by mouth daily. 01/10/22   Larene Pickett, PA-C  traZODone (DESYREL) 50 MG tablet Take 1 tablet (50  mg total) by mouth at bedtime as needed for sleep. 04/19/22   Barb Merino, MD  albuterol (PROVENTIL,VENTOLIN) 90 MCG/ACT inhaler Inhale 2 puffs into the lungs every 6 (six) hours as needed. Shortness of breath and wheezing   03/22/12  [provider]      Allergies    Other    Review of Systems   Review of Systems  Constitutional:  Positive for chills, fatigue and fever (subjective).  HENT:  Positive for congestion. Negative for dental problem.   Eyes:  Negative for visual disturbance.  Respiratory:  Positive for cough, sputum production, chest  tightness, shortness of breath and wheezing.   Cardiovascular:  Negative for chest pain, palpitations and leg swelling.  Gastrointestinal:  Negative for abdominal pain, constipation, diarrhea, nausea and vomiting.  Genitourinary:  Negative for flank pain.  Musculoskeletal:  Negative for back pain and neck pain.  Skin:  Negative for rash.  Neurological:  Negative for light-headedness and headaches.  Psychiatric/Behavioral:  Negative for agitation.   All other systems reviewed and are negative.   Physical Exam Updated Vital Signs BP (!) 80/58   Pulse 86   Temp 97.8 F (36.6 C) (Oral)   Resp (!) 23   Ht '5\' 10"'$  (1.778 m)   Wt 40.8 kg   SpO2 100%   BMI 12.91 kg/m  Physical Exam Vitals and nursing note reviewed.  Constitutional:      General: He is not in acute distress.    Appearance: He is well-developed. He is ill-appearing. He is not toxic-appearing or diaphoretic.  HENT:     Head: Normocephalic and atraumatic.  Eyes:     Extraocular Movements: Extraocular movements intact.     Conjunctiva/sclera: Conjunctivae normal.     Pupils: Pupils are equal, round, and reactive to light.  Cardiovascular:     Rate and Rhythm: Regular rhythm. Tachycardia present.     Pulses: Normal pulses.     Heart sounds: No murmur heard. Pulmonary:     Effort: Pulmonary effort is normal. Tachypnea present. No respiratory distress.     Breath sounds: Rhonchi present.  Chest:     Chest wall: No tenderness.  Abdominal:     Palpations: Abdomen is soft.     Tenderness: There is no abdominal tenderness.  Musculoskeletal:        General: No swelling.     Cervical back: Neck supple.     Right lower leg: No tenderness. No edema.     Left lower leg: No tenderness. No edema.  Skin:    General: Skin is warm and dry.     Capillary Refill: Capillary refill takes less than 2 seconds.     Findings: No erythema.  Neurological:     General: No focal deficit present.     Mental Status: He is alert.   Psychiatric:        Mood and Affect: Mood normal.     ED Results / Procedures / Treatments   Labs (all labs ordered are listed, but only abnormal results are displayed) Labs Reviewed  COMPREHENSIVE METABOLIC PANEL - Abnormal; Notable for the following components:      Result Value   Sodium 132 (*)    Chloride 96 (*)    CO2 21 (*)    Creatinine, Ser 1.88 (*)    AST 43 (*)    GFR, Estimated 40 (*)    All other components within normal limits  CBC WITH DIFFERENTIAL/PLATELET - Abnormal; Notable for the following components:  WBC 12.0 (*)    RDW 17.5 (*)    Neutro Abs 9.6 (*)    All other components within normal limits  URINALYSIS, W/ REFLEX TO CULTURE (INFECTION SUSPECTED) - Abnormal; Notable for the following components:   Specific Gravity, Urine <1.005 (*)    Bacteria, UA RARE (*)    All other components within normal limits  RESP PANEL BY RT-PCR (RSV, FLU A&B, COVID)  RVPGX2  CULTURE, BLOOD (ROUTINE X 2)  CULTURE, BLOOD (ROUTINE X 2)  MRSA NEXT GEN BY PCR, NASAL  PROTIME-INR  APTT  LACTIC ACID, PLASMA  LACTIC ACID, PLASMA  BRAIN NATRIURETIC PEPTIDE  BLOOD GAS, VENOUS  TROPONIN I (HIGH SENSITIVITY)  TROPONIN I (HIGH SENSITIVITY)    EKG EKG Interpretation  Date/Time:  Thursday August 17 2022 10:47:59 EST Ventricular Rate:  94 PR Interval:  127 QRS Duration: 86 QT Interval:  366 QTC Calculation: 458 R Axis:   89 Text Interpretation: Sinus rhythm Borderline right axis deviation when compared to prior, similar appearance. No STEMI Confirmed by Antony Blackbird [phone]) on 08/17/2022 10:49:33 AM  Radiology DG Chest Port 1 View  Result Date: 08/17/2022 CLINICAL DATA:  Questionable sepsis. EXAM: PORTABLE CHEST 1 VIEW COMPARISON:  04/10/2022. FINDINGS: 1054 hours. Hyperexpanded lungs with prominent interstitial markings, unchanged and consistent with emphysema. Few nodular opacities in the left lower lung may reflect developing infection or aspiration. Stable cardiac  and mediastinal contours. No pleural effusion or pneumothorax. IMPRESSION: 1. Few nodular opacities in the left lower lung may reflect developing infection or aspiration. Follow-up to resolution is recommended. 2.  Emphysema (ICD10-J43.9). Electronically Signed   By: Emmit Alexanders M.D.   On: 08/17/2022 11:06    Procedures Procedures    CRITICAL CARE Performed by: Gwenyth Allegra Kateleen Encarnacion Total critical care time: 35 minutes Critical care time was exclusive of separately billable procedures and treating other patients. Critical care was necessary to treat or prevent imminent or life-threatening deterioration. Critical care was time spent personally by me on the following activities: development of treatment plan with patient and/or surrogate as well as nursing, discussions with consultants, evaluation of patient's response to treatment, examination of patient, obtaining history from patient or surrogate, ordering and performing treatments and interventions, ordering and review of laboratory studies, ordering and review of radiographic studies, pulse oximetry and re-evaluation of patient's condition.  Medications Ordered in ED Medications  lactated ringers infusion (has no administration in time range)  lactated ringers bolus 1,000 mL (1,000 mLs Intravenous New Bag/Given 08/17/22 1046)    And  lactated ringers bolus 250 mL (has no administration in time range)  ceFEPIme (MAXIPIME) 2 g in sodium chloride 0.9 % 100 mL IVPB (has no administration in time range)  metroNIDAZOLE (FLAGYL) IVPB 500 mg (500 mg Intravenous New Bag/Given 08/17/22 1124)  vancomycin (VANCOCIN) IVPB 1000 mg/200 mL premix (has no administration in time range)  methylPREDNISolone sodium succinate (SOLU-MEDROL) 125 mg/2 mL injection 125 mg (has no administration in time range)    ED Course/ Medical Decision Making/ A&P                             Medical Decision Making Amount and/or Complexity of Data Reviewed Labs:  ordered. Radiology: ordered. ECG/medicine tests: ordered.  Risk OTC drugs. Prescription drug management. Decision regarding hospitalization.    KIMAR SALEE is a 62 y.o. male with a past medical history significant for COPD and asthma reportedly on some intermittent home oxygen,  hypertension, alcohol use, and previous pneumonia who presents for respiratory distress.  According to EMS, patient called out for shortness of breath and hypotension and has had productive cough.  This is been going for last few days he reports an Szymon ingestion troubles.  He currently denies any chest pain but has the shortness of breath.  He was given DuoNebs with EMS with improvement in the breathing.  His oxygen saturation was reportedly in the 70s on arrival.  He has had improvement in wheezing and route but blood pressure was found to be in the 0000000 and Q000111Q systolic.  Patient reports has had some shaking chills and subjective fevers at home and was found to be tachypneic, tachycardic and hypotensive.  He was also hypoxic.  Will make him a code sepsis for suspected pneumonia with sepsis.  On exam, patient had coarse breath sounds bilaterally.  His legs are nonedematous and he did not have evidence of rales.  Low suspicion for fluid overload so we will give him 30 cc/kg given the hypotension.  He will get broad-spectrum antibiotics.  His pulses were intact in extremities and patient is mentating well now that his oxygen saturations are in the 90s.  He is denying nausea vomiting or abdominal pain.  No tenderness on my exam.  Patient breathing heavily.  Patient will need admission after workup is completed.  Suspect pneumonia.  11:40 AM X-ray obtained showing evidence of emphysema and likely pneumonia.  He does have a leukocytosis.  Will wait for rest of workup and will call for admission for him.  If blood pressures not heart improved with fluids, will start on pressors and call ICU.  Blood pressure improved with  fluids.  X-ray shows pneumonia.  Patient will be admitted by medicine for further management of sepsis and pneumonia.  Oxygen saturation doing much better on oxygen.  Patient admitted to medicine for further management.        Final Clinical Impression(s) / ED Diagnoses Final diagnoses:  Sepsis due to pneumonia [facility])     Clinical Impression: 1. Sepsis due to pneumonia [facility])     Disposition: Admit  This note was prepared with assistance of Dragon voice recognition software. Occasional wrong-word or sound-a-like substitutions may have occurred due to the inherent limitations of voice recognition software.     Caius Silbernagel, Gwenyth Allegra, MD 08/17/22 [phone]

## 2022-08-17 NOTE — Sepsis Progress Note (Signed)
Discussing blood work with bedside RN [place] via secure  chat. Pt has been stuck multiple time by multiple staff members, and no one was able to get blood cultures or lactic acid.

## 2022-08-17 NOTE — ED Notes (Signed)
Pt ambulatory to bathroom

## 2022-08-17 NOTE — Progress Notes (Signed)
Pharmacy Antibiotic Note  Jaime Cline is a 62 y.o. male admitted on 08/17/2022 with  suspected pneumonia .  Pharmacy has been consulted for vancomycin and cefepime dosing. Pt with SOB, hypotensive, WBC 12, afebrile. Scr 1.88, BL ~1.   Plan: Vancomycin 1,000 mg IV x 1, then 750 mg q48h (eAUC 528.3, Scr 1.88, using TBW, Vd 0.72)  Cefepime 2g IV q24h Monitor C&S, renal function, s/sx improvement, LOT and vancomycin levels at [facility] as indicated   Height: '5\' 10"'$  (177.8 cm) Weight: 40.8 kg (90 lb) IBW/kg (Calculated) : 73  Temp (24hrs), Avg:97.8 F (36.6 C), Min:97.8 F (36.6 C), Max:97.8 F (36.6 C)  No results for input(s): "WBC", "CREATININE", "LATICACIDVEN", "VANCOTROUGH", "VANCOPEAK", "VANCORANDOM", "GENTTROUGH", "GENTPEAK", "GENTRANDOM", "TOBRATROUGH", "TOBRAPEAK", "TOBRARND", "AMIKACINPEAK", "AMIKACINTROU", "AMIKACIN" in the last 168 hours.  CrCl cannot be calculated (Patient's most recent lab result is older than the maximum 21 days allowed.).    Allergies  Allergen Reactions   Other Other (See Comments)    BP med from [facility] about 3 years ago, caused some seizures, due to getting wrong BP med at that time.  Unknown name of med    Antimicrobials this admission: vancomycin 2/29 >>  cefepime 2/29 >>  metronidazole 2/29 x 1  Microbiology results: 2/29 BCx: sent 2/29 RVP: sent 2/29 MRSA pcr: ordered    Eliseo Gum, PharmD PGY1 Pharmacy Resident   08/17/2022  1:12 PM

## 2022-08-17 NOTE — Sepsis Progress Note (Signed)
eLink is following this Code Sepsis. °

## 2022-08-18 ENCOUNTER — Inpatient Hospital Stay (HOSPITAL_COMMUNITY): Payer: Medicaid Other

## 2022-08-18 DIAGNOSIS — A419 Sepsis, unspecified organism: Secondary | ICD-10-CM | POA: Diagnosis not present

## 2022-08-18 DIAGNOSIS — F17210 Nicotine dependence, cigarettes, uncomplicated: Secondary | ICD-10-CM | POA: Diagnosis not present

## 2022-08-18 DIAGNOSIS — F32A Depression, unspecified: Secondary | ICD-10-CM | POA: Insufficient documentation

## 2022-08-18 DIAGNOSIS — E8729 Other acidosis: Secondary | ICD-10-CM | POA: Insufficient documentation

## 2022-08-18 DIAGNOSIS — R1013 Epigastric pain: Secondary | ICD-10-CM | POA: Insufficient documentation

## 2022-08-18 DIAGNOSIS — J9601 Acute respiratory failure with hypoxia: Secondary | ICD-10-CM | POA: Diagnosis not present

## 2022-08-18 DIAGNOSIS — N179 Acute kidney failure, unspecified: Secondary | ICD-10-CM | POA: Insufficient documentation

## 2022-08-18 LAB — BASIC METABOLIC PANEL
Anion gap: 9 (ref 5–15)
BUN: 10 mg/dL (ref 8–23)
CO2: 20 mmol/L — ABNORMAL LOW (ref 22–32)
Calcium: 8.3 mg/dL — ABNORMAL LOW (ref 8.9–10.3)
Chloride: 104 mmol/L (ref 98–111)
Creatinine, Ser: 1.11 mg/dL (ref 0.61–1.24)
GFR, Estimated: >60 mL/min (ref >60)
Glucose, Bld: 177 mg/dL — ABNORMAL HIGH (ref 70–99)
Potassium: 3.7 mmol/L (ref 3.5–5.1)
Sodium: 133 mmol/L — ABNORMAL LOW (ref 135–145)

## 2022-08-18 LAB — GASTROINTESTINAL PANEL BY PCR, STOOL (REPLACES STOOL CULTURE)

## 2022-08-18 LAB — CBC
HCT: 29.4 % — ABNORMAL LOW (ref 39.0–52.0)
Hemoglobin: 9.8 g/dL — ABNORMAL LOW (ref 13.0–17.0)
MCH: 31.9 pg (ref 26.0–34.0)
MCHC: 33.3 g/dL (ref 30.0–36.0)
MCV: 95.8 fL (ref 80.0–100.0)
Platelets: 224 10*3/uL (ref 150–400)
RBC: 3.07 MIL/uL — ABNORMAL LOW (ref 4.22–5.81)
RDW: 17.1 % — ABNORMAL HIGH (ref 11.5–15.5)
WBC: 6.3 10*3/uL (ref 4.0–10.5)
nRBC: 0 % (ref 0.0–0.2)

## 2022-08-18 LAB — GLUCOSE, CAPILLARY
Glucose-Capillary: 157 mg/dL — ABNORMAL HIGH (ref 70–99)
Glucose-Capillary: 156 mg/dL — ABNORMAL HIGH (ref 70–99)

## 2022-08-18 LAB — LACTIC ACID, PLASMA
Lactic Acid, Venous: 2.9 mmol/L (ref 0.5–1.9)
Lactic Acid, Venous: 3 mmol/L (ref 0.5–1.9)
Lactic Acid, Venous: 2.8 mmol/L (ref 0.5–1.9)

## 2022-08-18 MED ORDER — ADULT MULTIVITAMIN W/MINERALS CH
1.0000 | ORAL_TABLET | Freq: Every day | ORAL | Status: DC
  Administered 2022-08-18 – 2022-08-20 (×3): 1 via ORAL
  Filled 2022-08-18 (×3): qty 1

## 2022-08-18 MED ORDER — INSULIN ASPART 100 UNIT/ML IJ SOLN
0.0000 [IU] | Freq: Three times a day (TID) | INTRAMUSCULAR | Status: DC
  Administered 2022-08-18 (×2): 2 [IU] via SUBCUTANEOUS
  Administered 2022-08-19 – 2022-08-20 (×3): 1 [IU] via SUBCUTANEOUS

## 2022-08-18 MED ORDER — ENSURE ENLIVE PO LIQD
237.0000 mL | Freq: Three times a day (TID) | ORAL | Status: DC
  Administered 2022-08-18 – 2022-08-20 (×7): 237 mL via ORAL

## 2022-08-18 MED ORDER — ALUM & MAG HYDROXIDE-SIMETH 200-200-20 MG/5ML PO SUSP
15.0000 mL | Freq: Four times a day (QID) | ORAL | Status: DC | PRN
  Administered 2022-08-18: 15 mL via ORAL
  Filled 2022-08-18: qty 30

## 2022-08-18 MED ORDER — IPRATROPIUM-ALBUTEROL 0.5-2.5 (3) MG/3ML IN SOLN: 3.0000 mL | Freq: Four times a day (QID) | RESPIRATORY_TRACT | Status: DC

## 2022-08-18 MED ORDER — AZITHROMYCIN 250 MG PO TABS
500.0000 mg | ORAL_TABLET | Freq: Every day | ORAL | Status: AC
  Administered 2022-08-18 – 2022-08-19 (×2): 500 mg via ORAL
  Filled 2022-08-18 (×2): qty 2

## 2022-08-18 MED ORDER — BANATROL TF EN LIQD
60.0000 mL | Freq: Two times a day (BID) | ENTERAL | Status: DC
  Administered 2022-08-18 – 2022-08-20 (×5): 60 mL via ORAL
  Filled 2022-08-18 (×5): qty 60

## 2022-08-18 MED ORDER — LACTATED RINGERS IV BOLUS
500.0000 mL | Freq: Once | INTRAVENOUS | Status: AC
  Administered 2022-08-18: 500 mL via INTRAVENOUS

## 2022-08-18 MED ORDER — GUAIFENESIN-DM 100-10 MG/5ML PO SYRP
5.0000 mL | ORAL_SOLUTION | Freq: Four times a day (QID) | ORAL | Status: DC | PRN
  Administered 2022-08-19: 5 mL via ORAL
  Filled 2022-08-18: qty 5

## 2022-08-18 MED ORDER — SODIUM CHLORIDE 0.9 % IV SOLN
2.0000 g | INTRAVENOUS | Status: DC
  Administered 2022-08-18 – 2022-08-20 (×2): 2 g via INTRAVENOUS
  Filled 2022-08-18 (×2): qty 20

## 2022-08-18 NOTE — Evaluation (Signed)
Physical Therapy Evaluation Patient Details Name: Jaime Cline MRN: [id number] DOB: 19-Apr-1961 Today's Date: 08/18/2022  History of Present Illness  Pt is a 62 y.o. male who presented 08/17/22 with SOB, productive cough, and hypotension. Admitted for sepsis secondary to community-acquired pneumonia. PMH includes COPD, pulmonary HTN, alcohol abuse, asthma, and HTN   Clinical Impression  Pt presents with condition above and deficits mentioned below, see PT Problem List. PTA, he was mod I for functional mobility, often with no UE support but occasionally holding onto furniture or using his RW. He lives with his sister in a 1-level apartment with x7 STE. He does endorse x4 falls in the past 6 months and reports fatiguing quickly with activity, ranging from being on RA to needing 5L of supplemental O2 via [place] at home. Currently, pt is demonstrating deficits in strength, balance, activity tolerance, and aerobic endurance. He was able to ambulate without UE support at a min guard assist level but displayed DOE and increased trunk sway as distance progressed. He would benefit from a rollator for community distance mobility as it would provide him a seat to rest as needed with his endurance deficits. Recommending a tub bench as well as pt had been unable to stand for showers due to fear of falling. This would reduce the risk of this. Pt is currently requiring 2L of supplemental O2 to maintain sats >/= 94% when ambulating but is able to maintain sats at 98% on RA when at rest. Recommending follow-up with HHPT as pt reports he is functioning below his baseline. Will continue to follow acutely.       Recommendations for follow up therapy are one component of a multi-disciplinary discharge planning process, led by the attending physician.  Recommendations may be updated based on patient status, additional functional criteria and insurance authorization.  Follow Up Recommendations Home health PT      Assistance  Recommended at Discharge Intermittent Supervision/Assistance  Patient can return home with the following  A little help with bathing/dressing/bathroom;Assistance with cooking/housework;Assist for transportation;Help with stairs or ramp for entrance    Equipment Recommendations Rollator (4 wheels);Other (comment) (tub bench)  Recommendations for Other Services       Functional Status Assessment Patient has had a recent decline in their functional status and demonstrates the ability to make significant improvements in function in a reasonable and predictable amount of time.     Precautions / Restrictions Precautions Precautions: Fall;Other (comment) Precaution Comments: watch SpO2 Restrictions Weight Bearing Restrictions: No      Mobility  Bed Mobility Overal bed mobility: Modified Independent             General bed mobility comments: Extra time, but able to transition supine > sit R EOB with HOB elevated without assistance    Transfers Overall transfer level: Needs assistance Equipment used: None Transfers: Sit to/from Stand Sit to Stand: Min guard           General transfer comment: Min guard assist for safety, no LOB    Ambulation/Gait Ambulation/Gait assistance: Min guard Gait Distance (Feet): 300 Feet Assistive device: None Gait Pattern/deviations: Step-through pattern, Decreased stride length Gait velocity: reduced Gait velocity interpretation: 1.31 - 2.62 ft/sec, indicative of limited community ambulator   General Gait Details: Pt able to negotiate tight spaces walking laps in room, increasing speed as cued. As distance and speed progressed, pt became more SOB and needed to slow down and perform standing rest breaks, noted increased trunk sway as well, but no LOB.  [place] for safety  Stairs            Wheelchair Mobility    Modified Rankin (Stroke Patients Only)       Balance Overall balance assessment: Mild deficits observed, not formally  tested                                           Pertinent Vitals/Pain Pain Assessment Pain Assessment: No/denies pain    Home Living Family/patient expects to be discharged to:: Private residence Living Arrangements: Other relatives (sister) Available Help at Discharge: Family;Available 24 hours/day Type of Home: Apartment Home Access: Stairs to enter Entrance Stairs-Rails: Left (ascending) Entrance Stairs-Number of Steps: 7   Home Layout: One level Home Equipment: Rolling Walker (2 wheels);Grab bars - tub/shower;Other (comment) (O2 (intermittently none and up to 5 L as needed))      Prior Function Prior Level of Function : Needs assist             Mobility Comments: Does not hold onto furniture often but intermittently will or use RW as needed. x4 falls in past 6 months. ADLs Comments: Takes baths due to fear of falling standing in shower. Pt likes to cook. Sister does the cleaning. Does not drive. Manages his own meds and finances.     Hand Dominance   Dominant Hand: Right    Extremity/Trunk Assessment   Upper Extremity Assessment Upper Extremity Assessment: Defer to OT evaluation    Lower Extremity Assessment Lower Extremity Assessment: Generalized weakness    Cervical / Trunk Assessment Cervical / Trunk Assessment: Normal  Communication   Communication: No difficulties  Cognition Arousal/Alertness: Awake/alert Behavior During Therapy: WFL for tasks assessed/performed Overall Cognitive Status: Within Functional Limits for tasks assessed                                          General Comments General comments (skin integrity, edema, etc.): BP stable with changes in position (SBP 127-133); SpO2 98% at rest on RA, down to 83% on RA when ambulating, >/= 94% on 2L when ambulating; provided IS and educated pt on use and frequency of use    Exercises     Assessment/Plan    PT Assessment Patient needs continued PT  services  PT Problem List Decreased strength;Decreased activity tolerance;Decreased mobility;Decreased balance;Cardiopulmonary status limiting activity       PT Treatment Interventions DME instruction;Gait training;Stair training;Functional mobility training;Therapeutic activities;Therapeutic exercise;Balance training;Neuromuscular re-education;Patient/family education    PT Goals (Current goals can be found in the Care Plan section)  Acute Rehab PT Goals Patient Stated Goal: to improve PT Goal Formulation: With patient Time For Goal Achievement: 09/01/22 Potential to Achieve Goals: Good    Frequency Min 3X/week     Co-evaluation               AM-PAC PT "6 Clicks" Mobility  Outcome Measure Help needed turning from your back to your side while in a flat bed without using bedrails?: None Help needed moving from lying on your back to sitting on the side of a flat bed without using bedrails?: None Help needed moving to and from a bed to a chair (including a wheelchair)?: A Little Help needed standing up from a chair using your arms (e.g., wheelchair or bedside chair)?:  A Little Help needed to walk in hospital room?: A Little Help needed climbing 3-5 steps with a railing? : A Little 6 Click Score: 20    End of Session   Activity Tolerance: Patient tolerated treatment well Patient left: in chair;with call bell/phone within reach;with chair alarm set;with nursing/sitter in room Nurse Communication: Mobility status;Other (comment) (vitals) PT Visit Diagnosis: Unsteadiness on feet (R26.81);Other abnormalities of gait and mobility (R26.89);Muscle weakness (generalized) (M62.81);History of falling (Z91.81);Difficulty in walking, not elsewhere classified (R26.2)    Time: [id number] PT Time Calculation (min) (ACUTE ONLY): 30 min   Charges:   PT Evaluation $PT Eval Moderate Complexity: 1 Mod PT Treatments $Therapeutic Activity: 8-22 mins        Moishe Spice, PT, DPT Acute  Rehabilitation Services  Office: [phone]   Orvan Falconer 08/18/2022, 9:55 AM

## 2022-08-18 NOTE — Progress Notes (Signed)
SATURATION QUALIFICATIONS: (This note is used to comply with regulatory documentation for home oxygen)  Patient Saturations on Room Air at Rest = 98%  Patient Saturations on Room Air while Ambulating = 83%  Patient Saturations on 2 Liters of oxygen while Ambulating = 94%  Please briefly explain why patient needs home oxygen: Pt is requiring supplemental O2 to maintain his sats >/= 90% when ambulating   Moishe Spice, PT, DPT Acute Rehabilitation Services  Office: [phone]

## 2022-08-18 NOTE — Progress Notes (Signed)
Modified Barium Swallow Study  Patient Details  Name: SHUFORD BYWATERS MRN: [id number] Date of Birth: [birth year]  Today's Date: 08/18/2022  Modified Barium Swallow completed.  Full report located under Chart Review in the Imaging Section.  History of Present Illness Mr. 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 ED course: Found to be tachypneic with RR in 20s, tachycardic w/ HR 90s-101, hypotensive with SBP in 60-70s, hypoxic requiring 3 L [place] and WBC of 12.0. CXR showed opacities in the LLL concerning for infection or aspiration. Code sepsis was called. BSE 4/0/23 functional pharyngeal swallow but clinical indicators and subjective report of possible esophageal dysphagia. Reviewed strategies for COPD/respiratory compromise.   Clinical Impression Pt exhibited minimal pharyngoesophgeal phase dysphagia with the oral and pharyngeal phase within normal range. There was one instance of trace, flash laryngeal penetration due to decreased timing of laryngeal closure with large cup sip thin barium that spontaneously exited the vestibule during the swallow. Swallow was initiated at the pyriform sinuses with thin and valleculae with all other consistencies. There was no pharyngeal residue and laryngeal elevation, anterior hyhoid and epiglottic movement were all adequate. He does have a prominent cricopharyngeus muscle leading to minimaly reduced distention and flow of barium however no retention present at or below the PES. He eructated during and after the study. MBS does not diagnose below the level of the PES however esophageal scan suspected possible minimal retrograde movement. Pt coughed x 2 during study without barium present in laryngeal vestibule or below. Suspect his symptoms are cervical esophageal and esophageal in nature. Reviewed  compensatory strategies with pt verbalizing understanding. Continue regular texture, thin liquids and no further ST needed at this time. Factors that may increase risk of adverse event in presence of aspiration Phineas Douglas & Padilla 2021):    Swallow Evaluation Recommendations Recommendations: PO diet PO Diet Recommendation: Regular;Thin liquids (Level 0) Liquid Administration via: Straw;Cup Medication Administration: Whole meds with liquid Supervision: Patient able to self-feed Swallowing strategies  : Slow rate;Small bites/sips Postural changes: Stay upright 30-60 min after meals;Position pt fully upright for meals Oral care recommendations: Oral care BID (2x/day)      Houston Siren 08/18/2022,11:24 AM

## 2022-08-18 NOTE — Progress Notes (Signed)
HD#1 Subjective:   Summary: Jaime Cline is a 62 y.o. male with pertinent PMH of COPD, pulmonary hypertension with cor pulmonale, HTN, and gastritis who presented with dyspnea, hypoxemia, and hypotension is admitted for sepsis 2/2 CAP.   Overnight Events: None  Patient is feeling much better this morning with improvement in his breathing.  He is still not back at baseline but did not have significant dyspnea when wearing his oxygen and walking around the room with physical therapy.  He did have about 4 episodes of diarrhea today without any blood.  Of note he does have oxygen at home but states that it was not working so he has not been using it.  Objective:  Vital signs in last 24 hours: Vitals:   08/18/22 0621 08/18/22 0749 08/18/22 0925 08/18/22 1711  BP: 110/62 126/85  132/79  Pulse: (!) 104 95  (!) 106  Resp: '18 17  16  '$ Temp: 98.1 F (36.7 C) 97.8 F (36.6 C)  98.2 F (36.8 C)  TempSrc: Oral Oral  Oral  SpO2: 98% 100% 100% 100%  Weight:      Height:       Supplemental O2: Nasal Cannula SpO2: 100 % O2 Flow Rate (L/min): 3 L/min   Physical Exam:  Constitutional: Chronically ill and thin appearing elderly male, in no acute distress Cardiovascular: regular rate and rhythm Pulmonary/Chest: normal work of breathing on room air, decreased air movement throughout lungs without focal wheezing Abdominal: soft, non-tender, non-distended, positive bowel sounds MSK: Globally thin without lower extremity edema Neurological: alert & oriented x 3, No focal deficits noted Skin: warm and dry  Filed Weights   08/17/22 1031  Weight: 40.8 kg      Intake/Output Summary (Last 24 hours) at 08/18/2022 1718 Last data filed at 08/18/2022 0908 Gross per 24 hour  Intake 3782.27 ml  Output 700 ml  Net 3082.27 ml   Net IO Since Admission: 3,442.27 mL [08/18/22 1718]  Pertinent Labs:    Latest Ref Rng & Units 08/18/2022    4:04 AM 08/17/2022   10:34 AM 04/12/2022    5:13 AM  CBC   WBC 4.0 - 10.5 K/uL 6.3  12.0  8.9   Hemoglobin 13.0 - 17.0 g/dL 9.8  14.3  10.6   Hematocrit 39.0 - 52.0 % 29.4  42.0  31.8   Platelets 150 - 400 K/uL 224  280  214        Latest Ref Rng & Units 08/18/2022    4:04 AM 08/17/2022   10:34 AM 04/11/2022    2:44 AM  CMP  Glucose 70 - 99 mg/dL 177  99  307   BUN 8 - 23 mg/dL '10  15  21   '$ Creatinine 0.61 - 1.24 mg/dL 1.11  1.88  1.02   Sodium 135 - 145 mmol/L 133  132  133   Potassium 3.5 - 5.1 mmol/L 3.7  4.2  3.9   Chloride 98 - 111 mmol/L 104  96  97   CO2 22 - 32 mmol/L '20  21  24   '$ Calcium 8.9 - 10.3 mg/dL 8.3  9.3  8.7   Total Protein 6.5 - 8.1 g/dL  7.5    Total Bilirubin 0.3 - 1.2 mg/dL  0.8    Alkaline Phos 38 - 126 U/L  76    AST 15 - 41 U/L  43    ALT 0 - 44 U/L  19      Assessment/Plan:  Principal Problem:   Sepsis ([place]) Active Problems:   CAP (community acquired pneumonia)   Protein-calorie malnutrition, severe   COPD with acute exacerbation ([place])   AKI (acute kidney injury) ([place])   Metabolic acidosis, increased anion gap   Depression   Dyspepsia   Patient Summary: Jaime Cline is a 62 y.o. male with pertinent PMH of COPD, pulmonary hypertension with cor pulmonale, HTN, and gastritis who presented with dyspnea, hypoxemia, and hypotension is admitted for sepsis 2/2 CAP, on hospital day 1.   #Sepsis #Community-acquired pneumonia Respiratory status is improved today and patient is saturating well on 2 L nasal cannula.  His lactate did go up to 4.9 last night but is trending down to 2.5.  We will continue to trend this to normal.  He is still coughing up whitish sputum but this is getting less.  He is having bothersome coughing at night and we will add a medication to help for his sleep.  -Continue Rocephin and azithromycin - Robitussin for nighttime cough -Legionella antigen -F/u respiratory culture -F/u blood culture -Trend CBC, lactate and fever curve   #COPD exacerbation Stable on 2 L per nasal cannula  today without significant wheezing on exam. S/p Solu-Medrol 125 mg x 1 and will continue on prednisone for 4 days. -Prednisone 40 mg daily -Daily Spiriva and Dulera - DuoNebs every 6 hours -O2 supplementation, wean for O2 goal 88-92%   #AKI #Anion gap metabolic acidosis Found to have a creatinine of 1.88 and bicarb of 21 with a gap of 15 on admission.  This was all secondary to GI losses and poor p.o. intake along with low blood pressures and lactic acidosis.  Trending lactic acid as above.  This morning renal function back to baseline with creatinine of 1.11 and bicarb still low at 20 but anion gap of 9.   #Severe protein malnutrition Patient with pulmonary cachexia with reported poor p.o. intake at home due to food being stuck in his throat.  BMI of 12.1. Patient has been gradually losing weight since last year due to poor p.o. intake and likely end-stage COPD.  RD and SLP consulted with MBS today that showed no significant aspiration or dysphagia.  Will continue with regular diet and appreciate RD evaluation and management. -Ensure for protein supplementation between meals -Consider re-engaging palliative care prior to discharge   #Depression -Sertraline 50 mg daily -Trazodone 50 mg as needed for sleep   #Gastritis -Protonix 40 mg daily  Diet: Normal IVF: None, VTE: Enoxaparin Code: DNR PT/OT recs: Home Health, [facility].   Dispo: Anticipated discharge to Home in 2-3 days pending further improvement and management of acute illness.   Johny Blamer, DO Internal Medicine Resident PGY-1 Pager: [phone]  Please contact the on call pager after 5 pm and on weekends at [phone].

## 2022-08-18 NOTE — Evaluation (Addendum)
Clinical/Bedside Swallow Evaluation Patient Details  Name: Jaime Cline MRN: [id number] Date of Birth: 1960/08/21  Today's Date: 08/18/2022 Time: SLP Start Time (ACUTE ONLY): 0827 SLP Stop Time (ACUTE ONLY): 0839 SLP Time Calculation (min) (ACUTE ONLY): 12 min  Past Medical History:  Past Medical History:  Diagnosis Date   Alcohol abuse    Asthma    Bronchitis    Chronic back pain    Chronic knee pain    Hypertension    Past Surgical History: No past surgical history on file. 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 ED course: Found to be tachypneic with RR in 20s, tachycardic w/ HR 90s-101, hypotensive with SBP in 60-70s, hypoxic requiring 3 L [place] and WBC of 12.0. CXR showed opacities in the LLL concerning for infection or aspiration. Code sepsis was called. BSE 4/0/23 functional pharyngeal swallow but clinical indicators and subjective report of possible esophageal dysphagia. Reviewed strategies for COPD/respiratory compromise.    Assessment / Plan / Recommendation  Clinical Impression  Pt has history of GER and reports pharyngeal globus sensation frequently during meals. Suspect esophageal or cervical esophageal, possibly PES involvement. Swallows with regular texture are somewhat effortful with immediate throat clearing noted. He affirms SOB when eating and recalls strategies for GER and COPD taught by SLP during last years BSE. There were not signs of aspiration with thin but he did have eructation. Given that he is here with possible pna, has GER, history of COPD and instrumental assessment would be beneficial to fully evaluate and MBS is scheduled today at 10:30. He is allowed to continue regular texture, thin  liquids until then. SLP Visit Diagnosis: Dysphagia, unspecified (R13.10)    Aspiration Risk   Mild aspiration risk    Diet Recommendation Regular;Thin liquid   Liquid Administration via: Straw;Cup Medication Administration: Whole meds with liquid Supervision: Patient able to self feed Compensations: Slow rate;Small sips/bites Postural Changes: Seated upright at 90 degrees    Other  Recommendations Oral Care Recommendations: Oral care BID    Recommendations for follow up therapy are one component of a multi-disciplinary discharge planning process, led by the attending physician.  Recommendations may be updated based on patient status, additional functional criteria and insurance authorization.  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 ED course: Found to be tachypneic with RR in 20s, tachycardic w/ HR 90s-101, hypotensive with SBP in 60-70s, hypoxic requiring 3 L [place] and WBC of 12.0. CXR showed opacities in the LLL concerning for infection or aspiration. Code sepsis was called. BSE 4/0/23 functional pharyngeal swallow but clinical indicators and subjective report of possible esophageal dysphagia. Reviewed strategies for COPD/respiratory compromise. Type of Study: Bedside Swallow Evaluation Previous Swallow Assessment:  (see HPI) Diet Prior to this Study: Regular;Thin liquids (Level 0) Temperature Spikes Noted: No Respiratory Status: Room air History of Recent Intubation: No Behavior/Cognition: Alert;Pleasant mood;Cooperative  Oral Cavity Assessment: Within Functional Limits Oral Care Completed by SLP: No Oral Cavity - Dentition: Edentulous Vision: Functional for self-feeding Self-Feeding Abilities: Able  to feed self Patient Positioning: Upright in chair Baseline Vocal Quality: Normal Volitional Cough: Strong Volitional Swallow: Able to elicit    Oral/Motor/Sensory Function Overall Oral Motor/Sensory Function: Within functional limits   Ice Chips Ice chips: Not tested   Thin Liquid Thin Liquid: Within functional limits    Nectar Thick Nectar Thick Liquid: Not tested   Honey Thick Honey Thick Liquid: Not tested   Puree Puree: Not tested   Solid     Solid: Impaired Pharyngeal Phase Impairments: Throat Clearing - Immediate;Other (comments) (effortful swallow)      Houston Siren 08/18/2022,8:48 AM

## 2022-08-18 NOTE — Progress Notes (Signed)
Initial Nutrition Assessment  DOCUMENTATION CODES:   Severe malnutrition in context of chronic illness, Underweight  INTERVENTION:  - Add Ensure Enlive po TID, each supplement provides 350 kcal and 20 grams of protein.  - Add Magic cup TID with meals, each supplement provides 290 kcal and 9 grams of protein  - Add MVI q day.   - Add Banatrol BID.   NUTRITION DIAGNOSIS:   Severe Malnutrition related to chronic illness as evidenced by severe muscle depletion, severe fat depletion, energy intake < 75% for > or equal to 1 month.  GOAL:   Patient will meet greater than or equal to 90% of their needs  MONITOR:   PO intake, Supplement acceptance  REASON FOR ASSESSMENT:   Consult Assessment of nutrition requirement/status  ASSESSMENT:   62 y.o. male admits related to SOB. PMH includes: alcohol abuse, asthma, bronchitis, HTN. Pt is currently receiving medical management related to sepsis.  Meds reviewed: lipitor, sliding scale insulin, prednisone. Labs reviewed: Na low.   The pt reports that he has been eating well since admission. He reports that he is drinking the Ensure and would even be agreeable to 3 per day. Pt states that he was not eating well for about a month PTA due to poor appetite. Pt has experienced wt loss but the time frame does not qualify for malnutrition. However, pt has significant wasting to qualify for malnutrition. He also reports some diarrhea, RD will add Banatrol BID for now. Will add supplements and continue to monitor PO intakes.  NUTRITION - FOCUSED PHYSICAL EXAM:  Flowsheet Row Most Recent Value  Orbital Region Severe depletion  Upper Arm Region Severe depletion  Thoracic and Lumbar Region Severe depletion  Buccal Region Severe depletion  Temple Region Severe depletion  Clavicle Bone Region Severe depletion  Clavicle and Acromion Bone Region Severe depletion  Scapular Bone Region Severe depletion  Dorsal Hand Severe depletion  Patellar Region Severe depletion  Anterior Thigh Region Severe depletion  Posterior Calf Region Severe depletion  Edema (RD Assessment) None  Hair Reviewed  Eyes Reviewed  Mouth Reviewed  Skin Reviewed  Nails Reviewed       Diet Order:   Diet Order             Diet regular Room service appropriate? Yes; Fluid consistency: Thin  Diet effective now                   EDUCATION NEEDS:   Not appropriate for education at this time  Skin:  Skin Assessment: Reviewed RN Assessment  Last BM:  2/29 - type 7  Height:   Ht Readings from Last 1 Encounters:  08/17/22 '5\' 10"'$  (1.778 m)    Weight:   Wt Readings from Last 1 Encounters:  08/17/22 40.8 kg    Ideal Body Weight:     BMI:  Body mass index is 12.91 kg/m.  Estimated Nutritional Needs:   Kcal:  1225-1430 kcals  Protein:  60-70 gm  Fluid:  >/= 1.2 L  Thalia Bloodgood, RD, LDN, CNSC.

## 2022-08-18 NOTE — Evaluation (Signed)
Occupational Therapy Evaluation Patient Details Name: Jaime Cline MRN: [id number] DOB: [birth year] Today's Date: 08/18/2022   History of Present Illness Pt is a 62 y.o. male who presented 08/17/22 with SOB, productive cough, and hypotension. Admitted for sepsis secondary to community-acquired pneumonia. PMH includes COPD, pulmonary HTN, alcohol abuse, asthma, and HTN   Clinical Impression   PTA, pt predominantly independent and living with sister. Upon eval, pt endorses decreased activity tolerance, strength, balance, and insight into deficits. Pt ambulating with min guard A - close supervision. Pt educated regarding basic energy conservation strategies, activity pacing, and breathing strategies. Recommending discharge home with [place]. Will follow acutely.      Recommendations for follow up therapy are one component of a multi-disciplinary discharge planning process, led by the attending physician.  Recommendations may be updated based on patient status, additional functional criteria and insurance authorization.   Follow Up Recommendations  Home health OT     Assistance Recommended at Discharge Intermittent Supervision/Assistance  Patient can return home with the following A little help with walking and/or transfers;A little help with bathing/dressing/bathroom;Assistance with cooking/housework;Assist for transportation;Help with stairs or ramp for entrance    Functional Status Assessment  Patient has had a recent decline in their functional status and demonstrates the ability to make significant improvements in function in a reasonable and predictable amount of time.  Equipment Recommendations  BSC/3in1    Recommendations for Other Services       Precautions / Restrictions Precautions Precautions: Fall;Other (comment) Precaution Comments: watch SpO2 Restrictions Weight Bearing Restrictions: No      Mobility Bed Mobility Overal bed mobility: Modified Independent              General bed mobility comments: Extra time, but able to transition supine > sit R EOB with HOB elevated without assistance    Transfers Overall transfer level: Needs assistance Equipment used: None Transfers: Sit to/from Stand Sit to Stand: Min guard           General transfer comment: Min guard assist for safety, no LOB      Balance Overall balance assessment: Mild deficits observed, not formally tested                                         ADL either performed or assessed with clinical judgement   ADL Overall ADL's : Needs assistance/impaired Eating/Feeding: Independent;Sitting   Grooming: Supervision/safety;Standing   Upper Body Bathing: Set up;Sitting   Lower Body Bathing: Supervison/ safety;Sit to/from stand   Upper Body Dressing : Set up;Sitting   Lower Body Dressing: Supervision/safety;Sit to/from stand Lower Body Dressing Details (indicate cue type and reason): educated re: compensatory techniques Toilet Transfer: Min guard;Ambulation Toilet Transfer Details (indicate cue type and reason): MIn guard A for safety     Tub/ Shower Transfer: Tub transfer;Min guard;Ambulation Tub/Shower Transfer Details (indicate cue type and reason): simulated in room Functional mobility during ADLs: Min guard General ADL Comments: Min gaurd A for safety. Pt with decresaed activity tolernace and poor awareness of current limitations. Even when asked to pace self, pt walking at fast pace and quickly becoming short of breath with SpO2 dropping as low as 88%.     Vision Ability to See in Adequate Light: 0 Adequate Patient Visual Report: No change from baseline Vision Assessment?: No apparent visual deficits Additional Comments: Able to read numbers on pulse ox  Perception     Praxis      Pertinent Vitals/Pain Pain Assessment Pain Assessment: No/denies pain     Hand Dominance Right   Extremity/Trunk Assessment Upper Extremity Assessment Upper  Extremity Assessment: Generalized weakness   Lower Extremity Assessment Lower Extremity Assessment: Defer to PT evaluation   Cervical / Trunk Assessment Cervical / Trunk Assessment: Normal   Communication Communication Communication: No difficulties   Cognition Arousal/Alertness: Awake/alert Behavior During Therapy: WFL for tasks assessed/performed Overall Cognitive Status: No family/caregiver present to determine baseline cognitive functioning                                 General Comments: following all commands. Able to generally explain how he manages his oxygen at home. Believe cognition near pt's personal baseline. Mild memory difficulty, as he recalls that someone has educated regarding target SpO2, but unable to recall which are "good numbers"     General Comments  Desatting to 88% SpO2 with functional mobility and requiring 2+ mins and incresae in O2 from 2 L to 4L.    Exercises     Shoulder Instructions      Home Living Family/patient expects to be discharged to:: Private residence Living Arrangements: Other relatives (sister) Available Help at Discharge: Family;Available 24 hours/day Type of Home: Apartment Home Access: Stairs to enter Entrance Stairs-Number of Steps: 7 Entrance Stairs-Rails: Left Home Layout: One level     Bathroom Shower/Tub: Teacher, early years/pre: Standard     Home Equipment: Conservation officer, nature (2 wheels);Grab bars - tub/shower;Other (comment) (oxygen as needed. Recently up to 5L when up)          Prior Functioning/Environment Prior Level of Function : Needs assist             Mobility Comments: Does not hold onto furniture often but intermittently will or use RW as needed. x4 falls in past 6 months. ADLs Comments: Takes baths due to fear of falling standing in shower. Pt likes to cook. Sister does the cleaning. Does not drive. Manages his own meds, cooking, and finances.        OT Problem List:  Decreased strength;Decreased activity tolerance;Impaired balance (sitting and/or standing);Cardiopulmonary status limiting activity      OT Treatment/Interventions: Therapeutic exercise;Self-care/ADL training;DME and/or AE instruction;Balance training;Patient/family education;Cognitive remediation/compensation;Therapeutic activities    OT Goals(Current goals can be found in the care plan section) Acute Rehab OT Goals Patient Stated Goal: walk more OT Goal Formulation: With patient Time For Goal Achievement: 09/01/22 Potential to Achieve Goals: Good  OT Frequency: Min 2X/week    Co-evaluation              AM-PAC OT "6 Clicks" Daily Activity     Outcome Measure Help from another person eating meals?: None Help from another person taking care of personal grooming?: A Little Help from another person toileting, which includes using toliet, bedpan, or urinal?: A Little Help from another person bathing (including washing, rinsing, drying)?: A Little Help from another person to put on and taking off regular upper body clothing?: A Little Help from another person to put on and taking off regular lower body clothing?: A Little 6 Click Score: 19   End of Session Equipment Utilized During Treatment: Gait belt;Oxygen Nurse Communication: Mobility status  Activity Tolerance: Patient tolerated treatment well Patient left: in bed;with call bell/phone within reach;with bed alarm set  OT Visit Diagnosis: Unsteadiness on feet (  R26.81);Muscle weakness (generalized) (M62.81);Other abnormalities of gait and mobility (R26.89)                Time: 1241-1310 OT Time Calculation (min): 29 min Charges:  OT General Charges $OT Visit: 1 Visit OT Evaluation $OT Eval Low Complexity: 1 Low OT Treatments $Self Care/Home Management : 8-22 mins  Elder Cyphers, OTR/L [facility] Acute Rehabilitation Office: [phone]   Magnus Ivan 08/18/2022, 2:18 PM

## 2022-08-18 NOTE — TOC Initial Note (Addendum)
Transition of Care (TOC) - Initial/Assessment Note   Spoke to patient at bedside with Dr Nikki Dom present. Confirmed patient has Medicaid.   Consult for medication assistance. Patient states he does not have a PCP therefore he cannot get medications. Dr Nikki Dom offered for his team to follow in clinic. Patient accepted.   In secure chat Dr Nikki Dom told NCM their team will make follow up appointment.   Follow up appointment is with Dr Johnney Ou 08/29/22 at 1015 am . Information on AVS    Patient confirmed face sheet information.   Patient lives with sister . He has home oxygen , not sure company name.  Called [place] with [employer]. His oxygen is with [employer]. At discharge he will need a portable tank delivered to room.   Ordered Rollator and tub bench with [employer] , [place].   Patient does not have transportation home. Asked for Rollator and tub bench to be delivered to patient's home. Portable tank will need to be delivered to hospital room.   [employer] will need to be called at discharge.   Patient unaware he can use Medicaid transportation for appointments. Provided Medicaid transportation information and other transportation resources. Added Medicaid transportation number to AVS    PT recommending HHPT. Discussed with patient. Patient in agreement .   Cory with [facility] unable to accept patient  Levada Dy with [facility] unable to accept.   Hoyle Sauer with [facility] unable to accept  Amy with [facility] unable to accept   [facility] with [facility] unable to accept.   Kelly with [facility] can accept . Will need signed orders. MD and patient aware   Calvin with [facility] unable to accept referral   Portable tank for home has been delivered to his room. Patient now believes he will have transportation home at discharge  Patient Details  Name: Jaime Cline MRN: [id number] Date of Birth: April 05, 1961  Transition [facility]) CM/SW Contact:    Marilu Favre, RN Phone Number: 08/18/2022,  10:10 AM  Clinical Narrative:                   Expected Discharge Plan: [place] Barriers to Discharge: Continued Medical Work up   Patient Goals and CMS Choice Patient states their goals for this hospitalization and ongoing recovery are:: to return to home CMS Medicare.gov Compare Post Acute Care list provided to:: Patient Choice offered to / list presented to : Patient [place] ownership interest in [facility]provided to:: Patient    Expected Discharge Plan and Services   Discharge Planning Services: CM Consult Post Acute Care Choice: [place] arrangements for the past 2 months: Apartment                 DME Arranged: Tub bench, Walker rolling with seat DME Agency: [employer] Date DME Agency Contacted: 08/18/22 Time DME Agency Contacted: [id number] Representative spoke with at DME Agency: [place]: PT          Prior Living Arrangements/Services Living arrangements for the past 2 months: Apartment Lives with:: Siblings Patient language and need for interpreter reviewed:: Yes Do you feel safe going back to the place where you live?: Yes      Need for Family Participation in Patient Care: Yes (Comment) Care giver support system in place?: Yes (comment)   Criminal Activity/Legal Involvement Pertinent to Current Situation/Hospitalization: No - Comment as needed  Activities of Daily Living Home Assistive Devices/Equipment: Walker (specify type), Oxygen (walker with seat)  ADL Screening (condition at time of admission) Patient's cognitive ability adequate to safely complete daily activities?: Yes Is the patient deaf or have difficulty hearing?: No Does the patient have difficulty seeing, even when wearing glasses/contacts?: No Does the patient have difficulty concentrating, remembering, or making decisions?: No Patient able to express need for assistance with ADLs?: Yes Does the patient have difficulty dressing or  bathing?: No Independently performs ADLs?: Yes (appropriate for developmental age) Does the patient have difficulty walking or climbing stairs?: No Weakness of Legs: Both Weakness of Arms/Hands: None  Permission Sought/Granted   Permission granted to share information with : No              Emotional Assessment Appearance:: Appears stated age Attitude/Demeanor/Rapport: Engaged Affect (typically observed): Accepting Orientation: : Oriented to Self, Oriented to Place, Oriented to  Time, Oriented to Situation Alcohol / Substance Use: Not Applicable Psych Involvement: No (comment)  Admission diagnosis:  Sepsis ([place]) [A41.9] Sepsis due to pneumonia ([place]) [J18.9, A41.9] Patient Active Problem List   Diagnosis Date Noted   Sepsis ([place]) 08/17/2022   Dyspnea 04/12/2022   Palliative care encounter 04/11/2022   Goals of care, counseling/discussion 04/11/2022   Counseling and coordination of care 04/11/2022   COPD with acute exacerbation ([place]) 04/10/2022   Syncope 09/28/2021   Protein-calorie malnutrition, severe 09/27/2021   Hyponatremia    Hypotension due to hypovolemia    Syncope and collapse 09/26/2021   Community acquired pneumonia of right lower lobe of lung    COPD exacerbation ([place]) 08/27/2021   Asthma, chronic 12/31/2012   Smoker 12/31/2012   Knee pain, bilateral 12/31/2012   Iron deficiency anemia 12/31/2012   Knee pain, chronic 12/31/2012   PCP:  Pcp, No Pharmacy:   [facility], [address] [place] [address] Phone: [phone] Fax: [phone]  [id number]Phone: [phone]: [phone] [id number] [address]: [phone]: [phone] [address]: [phone]: [phone]   Social Determinants of Health (SDOH) Social History: [place] (04/10/2022)  Housing: Low Risk  (04/10/2022)  Transportation Needs: No Transportation Needs (04/10/2022)  Utilities: Not At Risk (04/10/2022)  Tobacco Use: High Risk (04/10/2022)   SDOH Interventions:     Readmission Risk Interventions    04/12/2022    4:02 PM 01/27/2022   12:27 PM  Readmission Risk Prevention Plan  Transportation Screening Complete Complete  PCP or Specialist Appt within 5-7 Days Complete Not Complete  Not Complete comments  new PCP made for 8/28  Home Care Screening Complete Complete  Medication Review (RN CM) Complete Complete

## 2022-08-19 DIAGNOSIS — J189 Pneumonia, unspecified organism: Secondary | ICD-10-CM

## 2022-08-19 DIAGNOSIS — A419 Sepsis, unspecified organism: Secondary | ICD-10-CM | POA: Diagnosis not present

## 2022-08-19 LAB — CBC
HCT: 30.4 % — ABNORMAL LOW (ref 39.0–52.0)
Hemoglobin: 10.2 g/dL — ABNORMAL LOW (ref 13.0–17.0)
MCH: 32.4 pg (ref 26.0–34.0)
MCHC: 33.6 g/dL (ref 30.0–36.0)
MCV: 96.5 fL (ref 80.0–100.0)
Platelets: 229 10*3/uL (ref 150–400)
RBC: 3.15 MIL/uL — ABNORMAL LOW (ref 4.22–5.81)
RDW: 17.4 % — ABNORMAL HIGH (ref 11.5–15.5)
WBC: 10.8 10*3/uL — ABNORMAL HIGH (ref 4.0–10.5)
nRBC: 0 % (ref 0.0–0.2)

## 2022-08-19 LAB — RENAL FUNCTION PANEL
Albumin: 3.4 g/dL — ABNORMAL LOW (ref 3.5–5.0)
Anion gap: 10 (ref 5–15)
BUN: 6 mg/dL — ABNORMAL LOW (ref 8–23)
CO2: 25 mmol/L (ref 22–32)
Calcium: 9.1 mg/dL (ref 8.9–10.3)
Chloride: 103 mmol/L (ref 98–111)
Creatinine, Ser: 0.74 mg/dL (ref 0.61–1.24)
GFR, Estimated: >60 mL/min (ref >60)
Glucose, Bld: 122 mg/dL — ABNORMAL HIGH (ref 70–99)
Phosphorus: 2.5 mg/dL (ref 2.5–4.6)
Potassium: 3.8 mmol/L (ref 3.5–5.1)
Sodium: 138 mmol/L (ref 135–145)

## 2022-08-19 LAB — GLUCOSE, CAPILLARY
Glucose-Capillary: 93 mg/dL (ref 70–99)
Glucose-Capillary: 128 mg/dL — ABNORMAL HIGH (ref 70–99)
Glucose-Capillary: 125 mg/dL — ABNORMAL HIGH (ref 70–99)
Glucose-Capillary: 137 mg/dL — ABNORMAL HIGH (ref 70–99)

## 2022-08-19 LAB — STREP PNEUMONIAE URINARY ANTIGEN: Strep Pneumo Urinary Antigen: NEGATIVE

## 2022-08-19 MED ORDER — IPRATROPIUM-ALBUTEROL 0.5-2.5 (3) MG/3ML IN SOLN
3.0000 mL | RESPIRATORY_TRACT | Status: DC | PRN
  Administered 2022-08-20: 3 mL via RESPIRATORY_TRACT
  Filled 2022-08-19: qty 3

## 2022-08-19 NOTE — Progress Notes (Signed)
HD#2 Subjective:   Summary: Jaime Cline is a 62 y.o. male with pertinent PMH of COPD, pulmonary hypertension with cor pulmonale, HTN, and gastritis who presented with dyspnea, hypoxemia, and hypotension is admitted for sepsis 2/2 CAP.   Overnight Events: None  Is a little bit better this morning.  Still feels considerably short of breath when trying to get up out of the chair and use walker to get to bedside commode.  He does live with his sister who is able to help some.  Uses 2 L home O2.  Has oxygen.  Does not have a primary care doctor.  Willing to establish with [facility].  Still having diarrhea.  Does not feel ready to go home.  Complains of multiple IV sticks overnight.  Says he was wheezing earlier.  Discussed possibility of inflammatory bowel disease given chronic nature of diarrhea and findings of inflammation in terminal ileum on old CT  Objective:  Vital signs in last 24 hours: Vitals:   08/19/22 0337 08/19/22 0744 08/19/22 0824 08/19/22 1223  BP: 123/84 (!) 126/94    Pulse: 95 100  (!) 102  Resp: '16 17  18  '$ Temp: 98 F (36.7 C) 98.2 F (36.8 C)    TempSrc: Oral Oral    SpO2: 100% 100% 100%   Weight:      Height:       Supplemental O2: Nasal Cannula SpO2: 100 % O2 Flow Rate (L/min): 3 L/min FiO2 (%): 32 %   Physical Exam:  Constitutional: Chronically ill and thin appearing elderly male, in no acute distress Cardiovascular: regular rate and rhythm Pulmonary/Chest: normal work of breathing on room air, decreased air movement throughout lungs without focal wheezing Abdominal: soft, non-tender, non-distended, positive bowel sounds MSK: Globally thin without lower extremity edema Neurological: alert & oriented x 3, No focal deficits noted Skin: warm and dry  Filed Weights   08/17/22 1031  Weight: 40.8 kg      Intake/Output Summary (Last 24 hours) at 08/19/2022 1655 Last data filed at 08/19/2022 1600 Gross per 24 hour  Intake 100 ml  Output 300 ml  Net -200 ml    Net IO Since Admission: 3,482.27 mL [08/19/22 1655]  Pertinent Labs:    Latest Ref Rng & Units 08/19/2022    4:23 AM 08/18/2022    4:04 AM 08/17/2022   10:34 AM  CBC  WBC 4.0 - 10.5 K/uL 10.8  6.3  12.0   Hemoglobin 13.0 - 17.0 g/dL 10.2  9.8  14.3   Hematocrit 39.0 - 52.0 % 30.4  29.4  42.0   Platelets 150 - 400 K/uL 229  224  280        Latest Ref Rng & Units 08/19/2022    4:23 AM 08/18/2022    4:04 AM 08/17/2022   10:34 AM  CMP  Glucose 70 - 99 mg/dL 122  177  99   BUN 8 - 23 mg/dL '6  10  15   '$ Creatinine 0.61 - 1.24 mg/dL 0.74  1.11  1.88   Sodium 135 - 145 mmol/L 138  133  132   Potassium 3.5 - 5.1 mmol/L 3.8  3.7  4.2   Chloride 98 - 111 mmol/L 103  104  96   CO2 22 - 32 mmol/L '25  20  21   '$ Calcium 8.9 - 10.3 mg/dL 9.1  8.3  9.3   Total Protein 6.5 - 8.1 g/dL   7.5   Total Bilirubin 0.3 - 1.2  mg/dL   0.8   Alkaline Phos 38 - 126 U/L   76   AST 15 - 41 U/L   43   ALT 0 - 44 U/L   19     Assessment/Plan:   Principal Problem:   Sepsis ([place]) Active Problems:   CAP (community acquired pneumonia)   Protein-calorie malnutrition, severe   COPD with acute exacerbation (HCC)   AKI (acute kidney injury) ([place])   Metabolic acidosis, increased anion gap   Depression   Dyspepsia   Patient Summary: Jaime Cline is a 62 y.o. male with pertinent PMH of COPD, pulmonary hypertension with cor pulmonale, HTN, and gastritis who presented with dyspnea, hypoxemia, and hypotension is admitted for sepsis 2/2 CAP, on hospital day 2.   #Sepsis #Community-acquired pneumonia Breathing has improved.  Still feels significant dyspnea with exertion.  He wears 2 L at baseline at home, currently.  Is satting 100% on 3 L and have asked nursing to turn him down to 2 to prevent over oxygenation and CO2 retention.  Will give DuoNeb for symptoms.  He is otherwise hemodynamically stable.  Requiring any further fluid resuscitation.  Strep pneumo antigen negative leukocytosis today in the setting of  steroid initiation. -Continue Rocephin, likely selection to orals tomorrow. - Robitussin for nighttime cough -Legionella antigen pending -F/u respiratory culture -F/u blood culture -Trend CBC, lactate and fever curve   #COPD exacerbation Stable on 2 L per nasal cannula today without significant wheezing on exam. S/p Solu-Medrol 125 mg x 1 and will continue on prednisone for 4 days. -Prednisone 40 mg daily 2/4 -Daily Spiriva and Dulera - DuoNebs every 6 hours -O2 supplementation, wean for O2 goal 88-92%   #AKI #Anion gap metabolic acidosis AKI resolved.   Diarrhea #Gastritis This is an intermittent/recurrent issue for this patient.  Old CT abdomen from 2007 showing areas of inflammation in the terminal ileum.  Suspicious for inflammatory bowel disease, possibly Crohn's.  This could explain his weight loss and ongoing diarrhea. No recent colonoscopy records to indicate this is ever been worked up.  Will monitor for improvement with the steroids he is receiving for COPD exacerbation.  Will need GI follow-up as an outpatient for scope.  He is overdue for colonoscopy regardless. -Protonix 40 mg daily   #Severe protein malnutrition Patient with pulmonary cachexia with reported poor p.o. intake at home due to food being stuck in his throat.  BMI of 12.1. Patient has been gradually losing weight since last year due to poor p.o. intake and likely end-stage COPD.  RD and SLP consulted with MBS today that showed no significant aspiration or dysphagia.  Will continue with regular diet and appreciate RD evaluation and management. -Ensure for protein supplementation between meals -Consider re-engaging palliative care prior to discharge   #Depression -Sertraline 50 mg daily -Trazodone 50 mg as needed for sleep   Diet: Normal IVF: None, VTE: Enoxaparin Code: DNR PT/OT recs: Home Health, Rolator.   Dispo: Anticipated discharge to Home in 2-3 days pending further improvement and management of  acute illness.   Delene Ruffini, MD

## 2022-08-19 NOTE — Hospital Course (Addendum)
3/2: Patient reports difficulty breathing and wheezing overnight and this morning. Still experiencing a productive cough with clear phlegm. Patient is requesting mask as opposed to [place]. Also having some diarrhea with ~10x episodes yesterday. Denies fever or abdominal pain.

## 2022-08-19 NOTE — Progress Notes (Signed)
   08/19/22 1000  Mobility  Activity Ambulated with assistance to bathroom  Level of Assistance Standby assist, set-up cues, supervision of patient - no hands on  Assistive Device Front wheel walker  Distance Ambulated (ft) 20 ft  Activity Response Tolerated well  Mobility Referral Yes  $Mobility charge 1 Mobility   Mobility Specialist Progress Note  Pt was in bed and agreeable. Had no c/o pain. Left in chair w/ all needs met and call bell in reach.  Lucious Groves Mobility Specialist  Please contact via SecureChat or Rehab office at [phone]

## 2022-08-20 DIAGNOSIS — A419 Sepsis, unspecified organism: Secondary | ICD-10-CM | POA: Diagnosis not present

## 2022-08-20 DIAGNOSIS — J189 Pneumonia, unspecified organism: Secondary | ICD-10-CM | POA: Diagnosis not present

## 2022-08-20 LAB — CBC
HCT: 29.3 % — ABNORMAL LOW (ref 39.0–52.0)
Hemoglobin: 9.5 g/dL — ABNORMAL LOW (ref 13.0–17.0)
MCH: 31.7 pg (ref 26.0–34.0)
MCHC: 32.4 g/dL (ref 30.0–36.0)
MCV: 97.7 fL (ref 80.0–100.0)
Platelets: 201 10*3/uL (ref 150–400)
RBC: 3 MIL/uL — ABNORMAL LOW (ref 4.22–5.81)
RDW: 17.6 % — ABNORMAL HIGH (ref 11.5–15.5)
WBC: 10.3 10*3/uL (ref 4.0–10.5)
nRBC: 0 % (ref 0.0–0.2)

## 2022-08-20 LAB — GLUCOSE, CAPILLARY
Glucose-Capillary: 104 mg/dL — ABNORMAL HIGH (ref 70–99)
Glucose-Capillary: 140 mg/dL — ABNORMAL HIGH (ref 70–99)

## 2022-08-20 MED ORDER — AZITHROMYCIN 500 MG PO TABS: 500.0000 mg | ORAL_TABLET | Freq: Every day | ORAL | 0 refills | Status: AC

## 2022-08-20 MED ORDER — PREDNISONE 20 MG PO TABS: 40.0000 mg | ORAL_TABLET | Freq: Every day | ORAL | 0 refills | Status: AC

## 2022-08-20 MED ORDER — CEFDINIR 300 MG PO CAPS: 300.0000 mg | ORAL_CAPSULE | Freq: Two times a day (BID) | ORAL | 0 refills | Status: AC

## 2022-08-20 MED ORDER — TRELEGY ELLIPTA 200-62.5-25 MCG/ACT IN AEPB: 1.0000 | INHALATION_SPRAY | Freq: Every day | RESPIRATORY_TRACT | 0 refills | Status: DC

## 2022-08-20 NOTE — TOC Transition Note (Addendum)
Transition of Care [facility]) - CM/SW Discharge Note   Patient Details  Name: Jaime Cline MRN: [id number] Date of Birth: 10-07-1960  [facility]) CM/SW Contact:  Zenon Mayo, RN Phone Number: 08/20/2022, 12:52 PM   Clinical Narrative:    Patient is for dc today, the rollator and tub bench has been delivered to patient's home on 3/1, [employer] will bring a oxygen tank to patient's room prior to dc today. Staff RN notified. Per Staff RN patient will need ast with trasnportation. Assisted with cab voucher. Waiver signed.   Final next level of care: Home w Home Health Services Barriers to Discharge: Continued Medical Work up   Patient Goals and CMS Choice CMS Medicare.gov Compare Post Acute Care list provided to:: Patient Choice offered to / list presented to : Patient  Discharge Placement                         Discharge Plan and Services Additional resources added to the After Visit Summary for     Discharge Planning Services: CM Consult Post Acute Care Choice: Home Health          DME Arranged: Tub bench, Walker rolling with seat DME Agency: [employer] Date DME Agency Contacted: 08/18/22 Time DME Agency Contacted: [id number] Representative spoke with at DME Agency: [place]: PT          Social Determinants of Health ([place]) Interventions [place]: No Food Insecurity (04/10/2022)  Housing: Low Risk  (04/10/2022)  Transportation Needs: No Transportation Needs (04/10/2022)  Utilities: Not At Risk (04/10/2022)  Tobacco Use: High Risk (04/10/2022)     Readmission Risk Interventions    04/12/2022    4:02 PM 01/27/2022   12:27 PM  Readmission Risk Prevention Plan  Transportation Screening Complete Complete  PCP or Specialist Appt within 5-7 Days Complete Not Complete  Not Complete comments  new PCP made for 8/28  Home Care Screening Complete Complete  Medication Review (RN CM) Complete Complete

## 2022-08-20 NOTE — Progress Notes (Signed)
   08/20/22 1200  Mobility  Activity Ambulated with assistance in hallway  Level of Assistance Standby assist, set-up cues, supervision of patient - no hands on  Assistive Device Front wheel walker  Distance Ambulated (ft) 180 ft  Activity Response Tolerated well  Mobility Referral Yes  $Mobility charge 1 Mobility   Mobility Specialist Progress Note  Pre-Mobility: 94% SpO2(3L) During Mobility: 85-92% SpO2(3L) Post-Mobility: 91% SpO2(3L)  Pt was in bed and agreeable. X2 seated breaks d/t feeling very SOB but recovered w/ pursed lip breathing. Returned to bed w/ all needs met and call bell in reach.   Jaime Cline Mobility Specialist  Please contact via SecureChat or Rehab office at [phone]

## 2022-08-20 NOTE — Discharge Summary (Signed)
Name: Jaime Cline MRN: [id number] DOB: 05-16-1961 62 y.o. PCP: Riesa Pope, MD  Date of Admission: 08/17/2022 10:08 AM Date of Discharge: 08/20/22 Attending Physician: Dr. Philipp Ovens  Discharge Diagnosis: Principal Problem:   Sepsis due to pneumonia [facility]) Active Problems:   CAP (community acquired pneumonia)   Protein-calorie malnutrition, severe   COPD with acute exacerbation ([place])   AKI (acute kidney injury) ([place])   Metabolic acidosis, increased anion gap   Depression   Dyspepsia    Discharge Medications: Allergies as of 08/20/2022       Reactions   Other Other (See Comments)   BP med from [facility] about 3 years ago, caused some seizures, due to getting wrong BP med at that time.  Unknown name of med        Medication List     STOP taking these medications    Advair Diskus 100-50 MCG/ACT Aepb Generic drug: fluticasone-salmeterol   ipratropium-albuterol 0.5-2.5 (3) MG/3ML Soln Commonly known as: DUONEB   OVER THE COUNTER MEDICATION       TAKE these medications    acetaminophen 500 MG tablet Commonly known as: TYLENOL Take 1,000 mg by mouth every 6 (six) hours as needed for mild pain.   atorvastatin 20 MG tablet Commonly known as: LIPITOR Take 1 tablet (20 mg total) by mouth daily.   azithromycin 500 MG tablet Commonly known as: Zithromax Take 1 tablet (500 mg total) by mouth daily for 2 days.   BC Fast Pain Relief 650-195-33.3 MG Pack Generic drug: Aspirin-Salicylamide-Caffeine Take 1 Package by mouth daily as needed (pain).   cefdinir 300 MG capsule Commonly known as: OMNICEF Take 1 capsule (300 mg total) by mouth 2 (two) times daily for 2 days.   predniSONE 20 MG tablet Commonly known as: DELTASONE Take 2 tablets (40 mg total) by mouth daily with breakfast for 2 days. Start taking on: August 21, 2022   sertraline 50 MG tablet Commonly known as: ZOLOFT Take 1 tablet (50 mg total) by mouth daily.   traZODone 50 MG tablet Commonly  known as: DESYREL Take 1 tablet (50 mg total) by mouth at bedtime as needed for sleep.   Trelegy Ellipta 200-62.5-25 MCG/ACT Aepb Generic drug: Fluticasone-Umeclidin-Vilant Inhale 1 Inhalation into the lungs daily.   albuterol (2.5 MG/3ML) 0.083% nebulizer solution Commonly known as: PROVENTIL Take 2.5 mg by nebulization every 8 (eight) hours as needed for wheezing or shortness of breath.   Ventolin HFA 108 (90 Base) MCG/ACT inhaler Generic drug: albuterol Inhale 1-2 puffs into the lungs every 6 (six) hours as needed for wheezing.               Durable Medical Equipment  (From admission, onward)           Start     Ordered   08/20/22 1209  For home use only DME 3 n 1  Once        08/20/22 1209   08/18/22 0957  For home use only DME Tub bench  Once        08/18/22 0956   08/18/22 0957  For home use only DME Tub bench  Once        08/18/22 0956   08/18/22 0956  For home use only DME 4 wheeled rolling walker with seat  Once       Question:  Patient needs a walker to treat with the following condition  Answer:  Weakness   08/18/22 0956  Disposition and follow-up:   Jaime Cline was discharged from [facility] in Stable condition.  At the hospital follow up visit please address:  1.  Follow-up:  a.  Pneumonia-treated with ceftriaxone and azithromycin while hospitalized switched to cefdinir.  Azithromycin continued for 2 additional days outpatient.  Breathing back to baseline.  No fever, hemodynamically stable.  Ensure no further recurrence of symptoms.      b.  End-stage COPD-Advair stopped he was started on Trelegy.  Some wheezing but given DuoNeb prior to discharge.  Lungs were clear otherwise with no evidence of pneumonia present from admission.  Would benefit from establishing with a pulmonologist as outpatient.  Palliative care has also been consulted for this patient before and he would likely benefit from outpatient palliative  follow-up.   c.  Diarrhea.  This been an ongoing issue for the patient.  CT scan from 2007 shows terminal ileum inflammation suggestive of possible inflammatory bowel disease.  Further supportive this is that his diarrhea has typically resolved with steroid initiation for COPD exacerbations and when there was suspected possible adrenal insufficiency.  Would benefit from referral to GI for colonoscopy.  Colonoscopy would likely have to be done inpatient given his significant lung disease.   d.  2.  Labs / imaging needed at time of follow-up: none  3.  Pending labs/ test needing follow-up: none  4.  Medication Changes  Started: trelegy, prednisone 2 days  Stopped: dulera/advair  Abx -  Cefdinir 2 days, azithro 2 days  Follow-up Appointments:  Follow-up Information     Riesa Pope, MD Follow up.   Specialty: Internal Medicine Why: August 29, 2022 at 1015 am Contact information: [place] [address] [phone]         medicaid transportation. Call.   Contact information: [place], [place] Follow up.   Specialty: Home Health Services Contact information: [address] [place] [address] [phone]                 Hospital Course by problem list:  Pneumonia-patient admitted with sepsis criteria fluid resuscitated and treated with IV antibiotics.  Pneumonia improved with no auscultated lung findings so he was switched over to oral antibiotics.  Leukocytosis and fever resolved.  Did see slight bump of leukocytosis after initiating steroids for COPD exacerbation.  COPD exacerbation.  Likely improved with DuoNebs, breathing treatments, azithromycin and steroids.  Advair was switched to Trelegy on discharge.  He was evaluated by PT OT and they recommended home health PT OT.  Given his significant pulmonary cachexia and deconditioning, he was discharged with rolling walker, bedside commode, tub bench as well to  assist at home.  Discharge Subjective: Breathing feels okay, no new complaints.  Voices understanding of discharge plans later today.  Is asking to make sure that he has DME prior to discharge.  He will follow-up in clinic.  Discharge Exam:   BP (!) 129/91 (BP Location: Left Arm)   Pulse (!) 109   Temp 98.4 F (36.9 C) (Oral)   Resp 17   Ht '5\' 10"'$  (1.778 m)   Wt 40.8 kg   SpO2 100%   BMI 12.91 kg/m  Constitutional: Chronically ill and thin appearing elderly male, in no acute distress Cardiovascular: regular rate and rhythm Pulmonary/Chest: normal work of breathing on room air, decreased air movement throughout lungs diffuse mild wheeze today Abdominal: soft, non-tender, non-distended,  positive bowel sounds MSK: Globally thin without lower extremity edema Neurological: alert & oriented x 3, No focal deficits noted Skin: warm and dry  Pertinent Labs, Studies, and Procedures:     Latest Ref Rng & Units 08/20/2022    4:19 AM 08/19/2022    4:23 AM 08/18/2022    4:04 AM  CBC  WBC 4.0 - 10.5 K/uL 10.3  10.8  6.3   Hemoglobin 13.0 - 17.0 g/dL 9.5  10.2  9.8   Hematocrit 39.0 - 52.0 % 29.3  30.4  29.4   Platelets 150 - 400 K/uL 201  229  224        Latest Ref Rng & Units 08/19/2022    4:23 AM 08/18/2022    4:04 AM 08/17/2022   10:34 AM  CMP  Glucose 70 - 99 mg/dL 122  177  99   BUN 8 - 23 mg/dL '6  10  15   '$ Creatinine 0.61 - 1.24 mg/dL 0.74  1.11  1.88   Sodium 135 - 145 mmol/L 138  133  132   Potassium 3.5 - 5.1 mmol/L 3.8  3.7  4.2   Chloride 98 - 111 mmol/L 103  104  96   CO2 22 - 32 mmol/L '25  20  21   '$ Calcium 8.9 - 10.3 mg/dL 9.1  8.3  9.3   Total Protein 6.5 - 8.1 g/dL   7.5   Total Bilirubin 0.3 - 1.2 mg/dL   0.8   Alkaline Phos 38 - 126 U/L   76   AST 15 - 41 U/L   43   ALT 0 - 44 U/L   19     DG Swallowing Func-Speech Pathology  Result Date: 08/18/2022 Table formatting from the original result was not included. Modified Barium Swallow Study Patient Details Name: JAMELL COMBEE MRN: [id number] Date of Birth: 11/23/1960 Today's Date: 08/18/2022 HPI/PMH: HPI: Mr. Juntunen is a 62 year old with a history of being COPD complicated by pulmonary hypertension and right heart dysfunction, alcohol abuse, gastritis and HTN who presented to the ED for evaluation of shortness of breath. ED course: Found to be tachypneic with RR in 20s, tachycardic w/ HR 90s-101, hypotensive with SBP in 60-70s, hypoxic requiring 3 L [place] and WBC of 12.0. CXR showed opacities in the LLL concerning for infection or aspiration. Code sepsis was called. BSE 4/0/23 functional pharyngeal swallow but clinical indicators and subjective report of possible esophageal dysphagia. Reviewed strategies for COPD/respiratory compromise. Clinical Impression: Clinical Impression: Pt exhibited minimal pharyngoesophgeal phase dysphagia with the oral and pharyngeal phase within normal range. There was one instance of trace, flash laryngeal penetration due to decreased timing of laryngeal closure with large cup sip thin barium that spontaneously exited the vestibule during the swallow. Swallow was initiated at the pyriform sinuses with thin and valleculae with all other consistencies. There was no pharyngeal residue and laryngeal elevation, anterior hyhoid and epiglottic movement were all adequate. He does have a prominent cricopharyngeus muscle leading to minimaly reduced distention and flow of barium however no retention present at or below the PES. He eructated during and after the study. MBS does not diagnose below the level of the PES however esophageal scan suspected possible minimal retrograde movement. Pt coughed x 2 during study without barium present in laryngeal vestibule or below. Suspect his symptoms are cervical esophageal and esophageal in nature. Reviewed compensatory strategies with pt verbalizing understanding. Continue regular texture, thin liquids and no further ST needed at this time.  Factors that may increase risk of  adverse event in presence of aspiration ([place] 2021): No data recorded Recommendations/Plan: Swallowing Evaluation Recommendations Swallowing Evaluation Recommendations Recommendations: PO diet PO Diet Recommendation: Regular; Thin liquids (Level 0) Liquid Administration via: Straw; Cup Medication Administration: Whole meds with liquid Supervision: Patient able to self-feed Swallowing strategies  : Slow rate; Small bites/sips Postural changes: Stay upright 30-60 min after meals; Position pt fully upright for meals Oral care recommendations: Oral care BID (2x/day) Treatment Plan Treatment Plan Treatment recommendations: No treatment recommended at this time Follow-up recommendations: No SLP follow up Functional status assessment: Patient has not had a recent decline in their functional status. Recommendations Recommendations for follow up therapy are one component of a multi-disciplinary discharge planning process, led by the attending physician.  Recommendations may be updated based on patient status, additional functional criteria and insurance authorization. Assessment: Orofacial Exam: Orofacial Exam Oral Cavity: Oral Hygiene: WFL Oral Cavity - Dentition: Edentulous Orofacial Anatomy: WFL Oral Motor/Sensory Function: WFL Anatomy: No data recorded Thin Liquids: Thin Liquids (Level 0) Thin Liquids : Impaired Bolus delivery method: Cup; Spoon; Straw Thin Liquid - Impairment: Pharyngeal impairment Initiation of swallow : Pyriform sinuses Soft palate elevation: No bolus between soft palate (SP)/pharyngeal wall (PW) Laryngeal elevation: Complete superior movement of thyroid cartilage with complete approximation of arytenoids to epiglottic petiole Anterior hyoid excursion: Complete Epiglottic movement: Complete Laryngeal vestibule closure: Incomplete, narrow column air/contrast in laryngeal vestibule Pharyngeal stripping wave : Present - complete Pharyngeal contraction (A/P view only): N/A Pharyngoesophageal  segment opening: Partial distention/partial duration, partial obstruction of flow Tongue base retraction: No contrast between tongue base and posterior pharyngeal wall (PPW) Pharyngeal residue: Complete pharyngeal clearance Penetration/Aspiration Scale (PAS) score: 2.  Material enters airway, remains ABOVE vocal cords then ejected out  Mildly Thick Liquids: Mildly thick liquids (Level 2, nectar thick) Mildly thick liquids (Level 2, nectar thick): Impaired Bolus delivery method: Cup; Spoon Mildly Thick Liquid - Impairment: Pharyngeal impairment Initiation of swallow : Valleculae Soft palate elevation: No bolus between soft palate (SP)/pharyngeal wall (PW) Laryngeal elevation: Complete superior movement of thyroid cartilage with complete approximation of arytenoids to epiglottic petiole Anterior hyoid excursion: Complete Epiglottic movement: Complete Laryngeal vestibule closure: Complete, no air/contrast in laryngeal vestibule Pharyngeal stripping wave : Present - complete Pharyngeal contraction (A/P view only): N/A Pharyngoesophageal segment opening: Partial distention/partial duration, partial obstruction of flow Tongue base retraction: No contrast between tongue base and posterior pharyngeal wall (PPW) Pharyngeal residue: Complete pharyngeal clearance Penetration/Aspiration Scale (PAS) score: 1.  Material does not enter airway  Moderately Thick Liquids: Moderately thick liquids (Level 3, honey thick) Moderately thick liquids (Level 3, honey thick): Impaired Bolus delivery method: Spoon Moderately Thick Liquid - Impairment: Pharyngeal impairment Initiation of swallow : Valleculae Soft palate elevation: No bolus between soft palate (SP)/pharyngeal wall (PW) Laryngeal elevation: Complete superior movement of thyroid cartilage with complete approximation of arytenoids to epiglottic petiole Anterior hyoid excursion: Complete Epiglottic movement: Complete Laryngeal vestibule closure: Complete, no air/contrast in  laryngeal vestibule Pharyngeal stripping wave : Present - complete Pharyngeal contraction (A/P view only): N/A Pharyngoesophageal segment opening: Partial distention/partial duration, partial obstruction of flow Tongue base retraction: No contrast between tongue base and posterior pharyngeal wall (PPW) Pharyngeal residue: Complete pharyngeal clearance Penetration/Aspiration Scale (PAS) score: 1.  Material does not enter airway  Puree: Puree Puree: Impaired Puree - Impairment: Pharyngeal impairment Initiation of swallow: Valleculae Soft palate elevation: No bolus between soft palate (SP)/pharyngeal wall (PW) Laryngeal elevation: Complete superior movement of thyroid  cartilage with complete approximation of arytenoids to epiglottic petiole Anterior hyoid excursion: Complete Epiglottic movement: Complete Laryngeal vestibule closure: Complete, no air/contrast in laryngeal vestibule Pharyngeal stripping wave : Present - complete Pharyngeal contraction (A/P view only): N/A Pharyngoesophageal segment opening: Partial distention/partial duration, partial obstruction of flow Tongue base retraction: No contrast between tongue base and posterior pharyngeal wall (PPW) Pharyngeal residue: N/A Penetration/Aspiration Scale (PAS) score: 1.  Material does not enter airway Solid: Solid Solid: Impaired Solid - Impairment: Pharyngeal impairment Initiation of swallow: Valleculae Soft palate elevation: No bolus between soft palate (SP)/pharyngeal wall (PW) Laryngeal elevation: Complete superior movement of thyroid cartilage with complete approximation of arytenoids to epiglottic petiole Anterior hyoid excursion: Complete Epiglottic movement: Complete Laryngeal vestibule closure: Complete, no air/contrast in laryngeal vestibule Pharyngeal stripping wave : Present - complete Pharyngeal contraction (A/P view only): N/A Pharyngoesophageal segment opening: Partial distention/partial duration, partial obstruction of flow Tongue base  retraction: No contrast between tongue base and posterior pharyngeal wall (PPW) Pharyngeal residue: Complete pharyngeal clearance Penetration/Aspiration Scale (PAS) score: 1.  Material does not enter airway Pill: Pill Pill: Not Tested Compensatory Strategies: No data recorded  General Information: Caregiver present: No  Diet Prior to this Study: Regular; Thin liquids (Level 0)   Temperature : Normal   Respiratory Status: WFL   Supplemental O2: None (Room air)   History of Recent Intubation: No  Behavior/Cognition: Alert; Pleasant mood; Cooperative Self-Feeding Abilities: Able to self-feed Baseline vocal quality/speech: Normal Volitional Cough: Able to elicit Volitional Swallow: Able to elicit No data recorded Goal Planning: No data recorded No data recorded No data recorded No data recorded Consulted and agree with results and recommendations: Patient Pain: Pain Assessment Pain Assessment: No/denies pain End of Session: Start Time:SLP Start Time (ACUTE ONLY): 1034 Stop Time: SLP Stop Time (ACUTE ONLY): 1044 Time Calculation:SLP Time Calculation (min) (ACUTE ONLY): 10 min Charges: SLP Evaluations $ SLP Speech Visit: 1 Visit SLP Evaluations $BSS Swallow: 1 Procedure $MBS Swallow: 1 Procedure SLP visit diagnosis: SLP Visit Diagnosis: Dysphagia, pharyngoesophageal phase (R13.14) Past Medical History: Past Medical History: Diagnosis Date  Alcohol abuse   Asthma   Bronchitis   Chronic back pain   Chronic knee pain   Hypertension  Past Surgical History: No past surgical history on file. Houston Siren 08/18/2022, 11:24 AM  DG Chest Port 1 View  Result Date: 08/17/2022 CLINICAL DATA:  Questionable sepsis. EXAM: PORTABLE CHEST 1 VIEW COMPARISON:  04/10/2022. FINDINGS: 1054 hours. Hyperexpanded lungs with prominent interstitial markings, unchanged and consistent with emphysema. Few nodular opacities in the left lower lung may reflect developing infection or aspiration. Stable cardiac and mediastinal contours. No  pleural effusion or pneumothorax. IMPRESSION: 1. Few nodular opacities in the left lower lung may reflect developing infection or aspiration. Follow-up to resolution is recommended. 2.  Emphysema (ICD10-J43.9). Electronically Signed   By: Emmit Alexanders M.D.   On: 08/17/2022 11:06     Discharge Instructions: Discharge Instructions     Call MD for:  difficulty breathing, headache or visual disturbances   Complete by: As directed    Call MD for:  extreme fatigue   Complete by: As directed    Call MD for:  hives   Complete by: As directed    Call MD for:  persistant dizziness or light-headedness   Complete by: As directed    Call MD for:  persistant nausea and vomiting   Complete by: As directed    Call MD for:  redness, tenderness, or signs of infection (pain,  swelling, redness, odor or green/yellow discharge around incision site)   Complete by: As directed    Call MD for:  severe uncontrolled pain   Complete by: As directed    Call MD for:  temperature >100.4   Complete by: As directed    Diet - low sodium heart healthy   Complete by: As directed    Increase activity slowly   Complete by: As directed        Signed: Delene Ruffini, MD 08/20/2022, 1:46 PM   Pager: [phone]

## 2022-08-20 NOTE — Discharge Instructions (Addendum)
Dear Mr. Shiltz,   Thank you for trusting [place] with your care.  We treated you in the hospital for pneumonia and a COPD exacerbation.   For your pneumonia, please take the antibiotic Cefdinir twice daily for two more days. Please also take the azithromycin '500mg'$  once daily for two more days.   For your COPD exacerbation, we have stopped your advair and duoneb and replaced it with a triple therapy medicine called Trelegy. Please use this once daily. Please also take '40mg'$  (2 tablets) of the Prednisone daily for two more days.   We want to see you in the [facility] for a hospital follow up. Our clinic is in the basement of the hospital. You have an appointment set up for the 12th at 10:15AM with Dr. Milford Cage. We can also try to get you seen by a lung doctor and stomach doctor.

## 2022-08-22 LAB — CULTURE, BLOOD (ROUTINE X 2)
Culture: NO GROWTH
Culture: NO GROWTH
Special Requests: ADEQUATE
Special Requests: ADEQUATE

## 2022-08-22 LAB — LEGIONELLA PNEUMOPHILA SEROGP 1 UR AG: L. pneumophila Serogp 1 Ur Ag: NEGATIVE

## 2022-08-29 ENCOUNTER — Encounter: Payer: Medicaid Other | Admitting: Student

## 2022-09-27 ENCOUNTER — Emergency Department (HOSPITAL_COMMUNITY): Payer: Medicaid Other

## 2022-09-27 ENCOUNTER — Encounter (HOSPITAL_COMMUNITY): Payer: Self-pay

## 2022-09-27 ENCOUNTER — Emergency Department (HOSPITAL_COMMUNITY)
Admission: EM | Admit: 2022-09-27 | Discharge: 2022-09-28 | Disposition: A | Discharge status: Home / Self Care | Payer: Medicaid Other | Attending: Emergency Medicine | Admitting: Emergency Medicine

## 2022-09-27 DIAGNOSIS — Z79899 Other long term (current) drug therapy: Secondary | ICD-10-CM | POA: Insufficient documentation

## 2022-09-27 DIAGNOSIS — Z7982 Long term (current) use of aspirin: Secondary | ICD-10-CM | POA: Insufficient documentation

## 2022-09-27 DIAGNOSIS — J45909 Unspecified asthma, uncomplicated: Secondary | ICD-10-CM | POA: Diagnosis not present

## 2022-09-27 DIAGNOSIS — E871 Hypo-osmolality and hyponatremia: Secondary | ICD-10-CM | POA: Diagnosis not present

## 2022-09-27 DIAGNOSIS — J441 Chronic obstructive pulmonary disease with (acute) exacerbation: Secondary | ICD-10-CM | POA: Diagnosis not present

## 2022-09-27 DIAGNOSIS — D72819 Decreased white blood cell count, unspecified: Secondary | ICD-10-CM | POA: Diagnosis not present

## 2022-09-27 DIAGNOSIS — R7401 Elevation of levels of liver transaminase levels: Secondary | ICD-10-CM | POA: Insufficient documentation

## 2022-09-27 DIAGNOSIS — I10 Essential (primary) hypertension: Secondary | ICD-10-CM | POA: Insufficient documentation

## 2022-09-27 DIAGNOSIS — Z1152 Encounter for screening for COVID-19: Secondary | ICD-10-CM | POA: Insufficient documentation

## 2022-09-27 DIAGNOSIS — R079 Chest pain, unspecified: Secondary | ICD-10-CM | POA: Diagnosis present

## 2022-09-27 LAB — CBC
HCT: 38.3 % — ABNORMAL LOW (ref 39.0–52.0)
Hemoglobin: 13.3 g/dL (ref 13.0–17.0)
MCH: 32.8 pg (ref 26.0–34.0)
MCHC: 34.7 g/dL (ref 30.0–36.0)
MCV: 94.6 fL (ref 80.0–100.0)
Platelets: 175 10*3/uL (ref 150–400)
RBC: 4.05 MIL/uL — ABNORMAL LOW (ref 4.22–5.81)
RDW: 16.1 % — ABNORMAL HIGH (ref 11.5–15.5)
WBC: 3.8 10*3/uL — ABNORMAL LOW (ref 4.0–10.5)
nRBC: 0 % (ref 0.0–0.2)

## 2022-09-27 LAB — COMPREHENSIVE METABOLIC PANEL
ALT: 56 U/L — ABNORMAL HIGH (ref 0–44)
AST: 104 U/L — ABNORMAL HIGH (ref 15–41)
Albumin: 3.9 g/dL (ref 3.5–5.0)
Alkaline Phosphatase: 75 U/L (ref 38–126)
Anion gap: 15 (ref 5–15)
BUN: 5 mg/dL — ABNORMAL LOW (ref 8–23)
CO2: 20 mmol/L — ABNORMAL LOW (ref 22–32)
Calcium: 8.5 mg/dL — ABNORMAL LOW (ref 8.9–10.3)
Chloride: 95 mmol/L — ABNORMAL LOW (ref 98–111)
Creatinine, Ser: 0.68 mg/dL (ref 0.61–1.24)
GFR, Estimated: >60 mL/min (ref >60)
Glucose, Bld: 80 mg/dL (ref 70–99)
Potassium: 3.7 mmol/L (ref 3.5–5.1)
Sodium: 130 mmol/L — ABNORMAL LOW (ref 135–145)
Total Bilirubin: 0.3 mg/dL (ref 0.3–1.2)
Total Protein: 6.8 g/dL (ref 6.5–8.1)

## 2022-09-27 LAB — TROPONIN I (HIGH SENSITIVITY): Troponin I (High Sensitivity): 4 ng/L (ref <18)

## 2022-09-27 LAB — CBG MONITORING, ED: Glucose-Capillary: 91 mg/dL (ref 70–99)

## 2022-09-27 NOTE — ED Provider Triage Note (Signed)
Emergency Medicine Provider Triage Evaluation Note  AABAN SPIELBERG , a 62 y.o. male  was evaluated in triage.  Pt complains of chest pain. Diffused pain to chest x 2 days along with sob, weakness, L arm numbness and recurrent falls.  Denies hitting head or LOC.  Hx of COPD and have been using increase inhaler.  No fever, productive cough, dysuria  Review of Systems  Positive: As above Negative: As above  Physical Exam  BP 112/71 (BP Location: Left Arm)   Pulse 78   Temp 98.2 F (36.8 C) (Oral)   Resp 17   SpO2 98%  Gen:   Awake, no distress   Resp:  Normal effort  MSK:   Moves extremities without difficulty  Other:    Medical Decision Making  Medically screening exam initiated at 6:20 PM.  Appropriate orders placed.  Jenelle Mages was informed that the remainder of the evaluation will be completed by another provider, this initial triage assessment does not replace that evaluation, and the importance of remaining in the ED until their evaluation is complete.     Fayrene Helper, PA-C 09/27/22 1824

## 2022-09-27 NOTE — ED Triage Notes (Addendum)
Pt bib ems from home; L sided cp x 2 days, worse with palpation and inspiration; states he has had to use more neb treatments at home than normal for copd; lung sounds clear with ems; NAD in triage, laughing and conversing with staff; fell twice in past 2 days, ems reports bilateral leg weakness at baseline; also states L arm keeps going numb intermittently; did not hit head, not on thinners; 120/84, cbg 107, 100% RA, RR 18, pulse 72

## 2022-09-28 ENCOUNTER — Other Ambulatory Visit: Payer: Self-pay

## 2022-09-28 LAB — TROPONIN I (HIGH SENSITIVITY): Troponin I (High Sensitivity): 3 ng/L (ref <18)

## 2022-09-28 LAB — RESP PANEL BY RT-PCR (RSV, FLU A&B, COVID)  RVPGX2
Influenza A by PCR: NEGATIVE
Influenza B by PCR: NEGATIVE
Resp Syncytial Virus by PCR: NEGATIVE
SARS Coronavirus 2 by RT PCR: NEGATIVE

## 2022-09-28 MED ORDER — METHYLPREDNISOLONE SODIUM SUCC 125 MG IJ SOLR
125.0000 mg | INTRAMUSCULAR | Status: AC
  Administered 2022-09-28: 125 mg via INTRAVENOUS
  Filled 2022-09-28: qty 2

## 2022-09-28 MED ORDER — IPRATROPIUM-ALBUTEROL 0.5-2.5 (3) MG/3ML IN SOLN
3.0000 mL | Freq: Once | RESPIRATORY_TRACT | Status: AC
  Administered 2022-09-28: 3 mL via RESPIRATORY_TRACT
  Filled 2022-09-28: qty 3

## 2022-09-28 MED ORDER — SODIUM CHLORIDE 0.9 % IV BOLUS
500.0000 mL | Freq: Once | INTRAVENOUS | Status: AC
  Administered 2022-09-28: 500 mL via INTRAVENOUS

## 2022-09-28 MED ORDER — MAGNESIUM SULFATE 2 GM/50ML IV SOLN
2.0000 g | Freq: Once | INTRAVENOUS | Status: AC
  Administered 2022-09-28: 2 g via INTRAVENOUS
  Filled 2022-09-28: qty 50

## 2022-09-28 MED ORDER — LACTATED RINGERS IV BOLUS: 1000.0000 mL | Freq: Once | INTRAVENOUS | Status: DC

## 2022-09-28 MED ORDER — LACTATED RINGERS IV BOLUS: 500.0000 mL | Freq: Once | INTRAVENOUS | Status: DC

## 2022-09-28 MED ORDER — PREDNISONE 10 MG (21) PO TBPK: ORAL_TABLET | Freq: Every day | ORAL | 0 refills | Status: DC

## 2022-09-28 MED ORDER — LORAZEPAM 1 MG PO TABS
0.5000 mg | ORAL_TABLET | ORAL | Status: AC
  Administered 2022-09-28: 0.5 mg via ORAL
  Filled 2022-09-28: qty 1

## 2022-09-28 NOTE — ED Provider Notes (Signed)
[place] [facility]  Provider Note   CSN: [id number] Arrival date & time: 09/27/22  1813     History  Chief Complaint  Patient presents with   Chest Pain    Jaime Cline is a 62 y.o. male.   Chest Pain  Patient is a 62 year old male with a past medical history significant for asthma, bronchitis, alcohol abuse, hypertension, chronic back and knee pain.  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  He is present emergency room today with some chest tightness for the past 3 days.  He states he also feels short of breath.  Denies any nausea or vomiting.  He denies any leg swelling but states that he feels that his legs are weak which is baseline for his functional status.  He states that he has had some falls recently because he is fatigued and weak.  He states that since he has been here in the emergency room his chest tightness has improved some but he still feels short of breath.  No recent surgeries, hospitalization, long travel, hemoptysis, estrogen containing OCP, cancer history.  No unilateral leg swelling.  No history of PE or VTE.      Home Medications Prior to Admission medications   Medication Sig Start Date End Date Taking? Authorizing Provider  predniSONE (STERAPRED UNI-PAK 21 TAB) 10 MG (21) TBPK tablet Take by mouth daily. Take 6 tabs by mouth daily  for 2 days, then 5 tabs for 2 days, then 4 tabs for 2 days, then 3 tabs for 2 days, 2 tabs for 2 days, then 1 tab by mouth daily for 2 days 09/28/22  Yes Kamarri Fischetti S, PA  acetaminophen (TYLENOL) 500 MG tablet Take 1,000 mg by mouth every 6 (six) hours as needed for mild pain.    [provider]  albuterol (PROVENTIL) (2.5 MG/3ML) 0.083% nebulizer solution Take 2.5 mg by nebulization every 8 (eight) hours as needed for wheezing or shortness of breath.    [provider]  albuterol (VENTOLIN HFA) 108 (90 Base) MCG/ACT inhaler Inhale 1-2 puffs into the lungs  every 6 (six) hours as needed for wheezing. 01/27/22   Dimple NanasAmin, Ankit Chirag, MD  Aspirin-Salicylamide-Caffeine (BC FAST PAIN RELIEF) 650-195-33.3 MG PACK Take 1 Package by mouth daily as needed (pain).    [provider]  atorvastatin (LIPITOR) 20 MG tablet Take 1 tablet (20 mg total) by mouth daily. 01/27/22   Amin, Loura HaltAnkit Chirag, MD  Fluticasone-Umeclidin-Vilant (TRELEGY ELLIPTA) 200-62.5-25 MCG/ACT AEPB Inhale 1 Inhalation into the lungs daily. 08/20/22   Adron BeneGawaluck, [place], MD  sertraline (ZOLOFT) 50 MG tablet Take 1 tablet (50 mg total) by mouth daily. 01/10/22   Garlon HatchetSanders, Lisa M, PA-C  traZODone (DESYREL) 50 MG tablet Take 1 tablet (50 mg total) by mouth at bedtime as needed for sleep. 04/19/22   Dorcas CarrowGhimire, Kuber, MD  albuterol (PROVENTIL,VENTOLIN) 90 MCG/ACT inhaler Inhale 2 puffs into the lungs every 6 (six) hours as needed. Shortness of breath and wheezing   03/22/12  [provider]      Allergies    Other    Review of Systems   Review of Systems  Cardiovascular:  Positive for chest pain.    Physical Exam Updated Vital Signs BP 119/77 (BP Location: Right Arm)   Pulse 87   Temp (!) 97.4 F (36.3 C) (Oral)   Resp 19   SpO2 96%  Physical Exam Vitals and nursing note reviewed.  Constitutional:  General: He is not in acute distress. HENT:     Head: Normocephalic and atraumatic.     Nose: Nose normal.  Eyes:     General: No scleral icterus. Cardiovascular:     Rate and Rhythm: Normal rate and regular rhythm.     Pulses: Normal pulses.     Heart sounds: Normal heart sounds.  Pulmonary:     Effort: Pulmonary effort is normal. No respiratory distress.     Breath sounds: Wheezing present.     Comments: Inspiratory and expiratory wheezing, speaking in full sentences.    Abdominal:     Palpations: Abdomen is soft.     Tenderness: There is no abdominal tenderness.  Musculoskeletal:     Cervical back: Normal range of motion.     Right lower leg: No edema.      Left lower leg: No edema.  Skin:    General: Skin is warm and dry.     Capillary Refill: Capillary refill takes less than 2 seconds.  Neurological:     Mental Status: He is alert. Mental status is at baseline.  Psychiatric:        Mood and Affect: Mood normal.        Behavior: Behavior normal.    ED Results / Procedures / Treatments   Labs (all labs ordered are listed, but only abnormal results are displayed) Labs Reviewed  CBC - Abnormal; Notable for the following components:      Result Value   WBC 3.8 (*)    RBC 4.05 (*)    HCT 38.3 (*)    RDW 16.1 (*)    All other components within normal limits  COMPREHENSIVE METABOLIC PANEL - Abnormal; Notable for the following components:   Sodium 130 (*)    Chloride 95 (*)    CO2 20 (*)    BUN 5 (*)    Calcium 8.5 (*)    AST 104 (*)    ALT 56 (*)    All other components within normal limits  RESP PANEL BY RT-PCR (RSV, FLU A&B, COVID)  RVPGX2  CBG MONITORING, ED  TROPONIN I (HIGH SENSITIVITY)  TROPONIN I (HIGH SENSITIVITY)    EKG EKG Interpretation  Date/Time:  Wednesday September 27 2022 18:27:06 EDT Ventricular Rate:  73 PR Interval:  134 QRS Duration: 80 QT Interval:  406 QTC Calculation: 447 R Axis:   91 Text Interpretation: Normal sinus rhythm Rightward axis Borderline ECG T wave inversion Confirmed by Zadie Rhine ([id number]) on 09/28/2022 12:29:31 AM  Radiology DG Chest 1 View  Result Date: 09/27/2022 CLINICAL DATA:  Chest pain EXAM: CHEST  1 VIEW COMPARISON:  08/17/2022 and prior studies FINDINGS: Cardiomediastinal silhouette is unremarkable. Emphysema changes are again noted. Previously identified nodular opacities overlying the LOWER LEFT lung are less conspicuous on this study. There is no evidence of focal airspace disease, pulmonary edema, mass, pleural effusion, or pneumothorax. No acute bony abnormalities are identified. IMPRESSION: 1. No evidence of acute cardiopulmonary disease. 2. Emphysema. Electronically  Signed   By: Harmon Pier M.D.   On: 09/27/2022 19:00    Procedures Procedures    Medications Ordered in ED Medications  ipratropium-albuterol (DUONEB) 0.5-2.5 (3) MG/3ML nebulizer solution 3 mL (3 mLs Nebulization Given 09/28/22 0412)  ipratropium-albuterol (DUONEB) 0.5-2.5 (3) MG/3ML nebulizer solution 3 mL (3 mLs Nebulization Given 09/28/22 0412)  ipratropium-albuterol (DUONEB) 0.5-2.5 (3) MG/3ML nebulizer solution 3 mL (3 mLs Nebulization Given 09/28/22 0407)  methylPREDNISolone sodium succinate (SOLU-MEDROL) 125 mg/2 mL injection 125  mg (125 mg Intravenous Given 09/28/22 0407)  sodium chloride 0.9 % bolus 500 mL (0 mLs Intravenous Stopped 09/28/22 0544)  LORazepam (ATIVAN) tablet 0.5 mg (0.5 mg Oral Given 09/28/22 0635)  magnesium sulfate IVPB 2 g 50 mL (0 g Intravenous Stopped 09/28/22 0750)    ED Course/ Medical Decision Making/ A&P                             Medical Decision Making Risk Prescription drug management.   This patient presents to the ED for concern of SOB, this involves a number of treatment options, and is a complaint that carries with it a high risk of complications and morbidity. A differential diagnosis was considered for the patient's symptoms which is discussed below:   The causes for shortness of breath include but are not limited to Cardiac (AHF, pericardial effusion and tamponade, arrhythmias, ischemia, etc) Respiratory (COPD, asthma, pneumonia, pneumothorax, primary pulmonary hypertension, PE/VQ mismatch) Hematological (anemia) Neuromuscular (ALS, Guillain-Barr, etc)    Co morbidities: Discussed in HPI   Brief History:  Patient is a 62 year old male with a past medical history significant for asthma, bronchitis, alcohol abuse, hypertension, chronic back and knee pain.  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  He is present emergency room today with some chest tightness for the past 3 days.  He states he also feels short of breath.  Denies  any nausea or vomiting.  He denies any leg swelling but states that he feels that his legs are weak which is baseline for his functional status.  He states that he has had some falls recently because he is fatigued and weak.  He states that since he has been here in the emergency room his chest tightness has improved some but he still feels short of breath.  No recent surgeries, hospitalization, long travel, hemoptysis, estrogen containing OCP, cancer history.  No unilateral leg swelling.  No history of PE or VTE.     EMR reviewed including pt PMHx, past surgical history and past visits to ER.   See HPI for more details   Lab Tests:  I ordered and independently interpreted labs. Labs notable for CMP with mild hyponatremia of 130, AST ALT marginally elevated but consistent with alcohol use  CBC with mild leukopenia no anemia  Trops 4>>3    Imaging Studies:  NAD. I personally reviewed all imaging studies and no acute abnormality found. I agree with radiology interpretation. Chest x-ray unremarkable emphysematous changes   Cardiac Monitoring:  The patient was maintained on a cardiac monitor.  I personally viewed and interpreted the cardiac monitored which showed an underlying rhythm of: NSR EKG non-ischemic   Medicines ordered:  I ordered medication including given 3 DuoNebs, Solu-Medrol via IV, 500 mL normal saline bolus, 0.5 mg of Ativan and magnesium 2 g for wheezing Reevaluation of the patient after these medicines showed that the patient improved I have reviewed the patients home medicines and have made adjustments as needed   Critical Interventions:   treatment of wheezing   Consults/Attending Physician      Reevaluation:  After the interventions noted above I re-evaluated patient and found that they have :resolved   Social Determinants of Health:  The patient's social determinants of health were a factor in the care of this patient    Problem List /  ED Course:  Patient with end-stage COPD complaining of chest tightness and shortness of breath.  Reassuring cardiac  workup here.  Chest x-ray without pneumonia or infiltrate or pneumothorax.  Significant proved minimal wheezing after treatment.  Brief episode of tachycardia after 3 breathing treatments back-to-back however after little bit of Ativan and time patient's tachycardia resolved and he states he is asymptomatic completely now.  Will discharge home with outpatient follow-up.  Return precautions discussed   Dispostion:  After consideration of the diagnostic results and the patients response to treatment, I feel that the patent would benefit from discharge with outpatient follow-up  Final Clinical Impression(s) / ED Diagnoses Final diagnoses:  COPD exacerbation    Rx / DC Orders ED Discharge Orders          Ordered    predniSONE (STERAPRED UNI-PAK 21 TAB) 10 MG (21) TBPK tablet  Daily        09/28/22 0719              Gailen Shelter, PA 09/29/22 0501    Zadie Rhine, MD 09/30/22 1115

## 2022-09-28 NOTE — ED Notes (Signed)
DC instructions reviewed with pt. PT verbalized understanding.  PT Dc.  

## 2022-10-07 ENCOUNTER — Emergency Department (HOSPITAL_COMMUNITY)
Admission: EM | Admit: 2022-10-07 | Discharge: 2022-10-07 | Disposition: A | Discharge status: Home / Self Care | Payer: Medicaid Other | Attending: Emergency Medicine | Admitting: Emergency Medicine

## 2022-10-07 ENCOUNTER — Other Ambulatory Visit: Payer: Self-pay

## 2022-10-07 ENCOUNTER — Emergency Department (HOSPITAL_COMMUNITY): Payer: Medicaid Other

## 2022-10-07 ENCOUNTER — Encounter (HOSPITAL_COMMUNITY): Payer: Self-pay

## 2022-10-07 DIAGNOSIS — Z20822 Contact with and (suspected) exposure to covid-19: Secondary | ICD-10-CM | POA: Insufficient documentation

## 2022-10-07 DIAGNOSIS — F17210 Nicotine dependence, cigarettes, uncomplicated: Secondary | ICD-10-CM | POA: Diagnosis not present

## 2022-10-07 DIAGNOSIS — R0602 Shortness of breath: Secondary | ICD-10-CM | POA: Diagnosis present

## 2022-10-07 DIAGNOSIS — R053 Chronic cough: Secondary | ICD-10-CM

## 2022-10-07 DIAGNOSIS — I10 Essential (primary) hypertension: Secondary | ICD-10-CM | POA: Insufficient documentation

## 2022-10-07 DIAGNOSIS — J441 Chronic obstructive pulmonary disease with (acute) exacerbation: Secondary | ICD-10-CM | POA: Diagnosis not present

## 2022-10-07 DIAGNOSIS — J45909 Unspecified asthma, uncomplicated: Secondary | ICD-10-CM | POA: Diagnosis not present

## 2022-10-07 DIAGNOSIS — Z79899 Other long term (current) drug therapy: Secondary | ICD-10-CM | POA: Insufficient documentation

## 2022-10-07 DIAGNOSIS — Z7951 Long term (current) use of inhaled steroids: Secondary | ICD-10-CM | POA: Insufficient documentation

## 2022-10-07 LAB — TROPONIN I (HIGH SENSITIVITY)
Troponin I (High Sensitivity): 4 ng/L (ref <18)
Troponin I (High Sensitivity): 5 ng/L (ref <18)

## 2022-10-07 LAB — SARS CORONAVIRUS 2 BY RT PCR: SARS Coronavirus 2 by RT PCR: NEGATIVE

## 2022-10-07 LAB — BRAIN NATRIURETIC PEPTIDE: B Natriuretic Peptide: 60.4 pg/mL (ref 0.0–100.0)

## 2022-10-07 LAB — CBC WITH DIFFERENTIAL/PLATELET
Abs Immature Granulocytes: 0.03 10*3/uL (ref 0.00–0.07)
Basophils Absolute: 0 10*3/uL (ref 0.0–0.1)
Basophils Relative: 0 %
Eosinophils Absolute: 0 10*3/uL (ref 0.0–0.5)
Eosinophils Relative: 0 %
HCT: 41.6 % (ref 39.0–52.0)
Hemoglobin: 13.8 g/dL (ref 13.0–17.0)
Immature Granulocytes: 0 %
Lymphocytes Relative: 9 %
Lymphs Abs: 0.7 10*3/uL (ref 0.7–4.0)
MCH: 33.1 pg (ref 26.0–34.0)
MCHC: 33.2 g/dL (ref 30.0–36.0)
MCV: 99.8 fL (ref 80.0–100.0)
Monocytes Absolute: 1.3 10*3/uL — ABNORMAL HIGH (ref 0.1–1.0)
Monocytes Relative: 16 %
Neutro Abs: 6.1 10*3/uL (ref 1.7–7.7)
Neutrophils Relative %: 75 %
Platelets: 180 10*3/uL (ref 150–400)
RBC: 4.17 MIL/uL — ABNORMAL LOW (ref 4.22–5.81)
RDW: 15.2 % (ref 11.5–15.5)
WBC: 8.3 10*3/uL (ref 4.0–10.5)
nRBC: 0 % (ref 0.0–0.2)

## 2022-10-07 LAB — BLOOD GAS, VENOUS
Acid-Base Excess: 3.4 mmol/L — ABNORMAL HIGH (ref 0.0–2.0)
Bicarbonate: 30.4 mmol/L — ABNORMAL HIGH (ref 20.0–28.0)
O2 Saturation: 21.6 %
Patient temperature: 37
pCO2, Ven: 55 mmHg (ref 44–60)
pH, Ven: 7.35 (ref 7.25–7.43)
pO2, Ven: <31 mmHg — CL (ref 32–45)

## 2022-10-07 LAB — BASIC METABOLIC PANEL
Anion gap: 12 (ref 5–15)
BUN: 18 mg/dL (ref 8–23)
CO2: 23 mmol/L (ref 22–32)
Calcium: 8.9 mg/dL (ref 8.9–10.3)
Chloride: 98 mmol/L (ref 98–111)
Creatinine, Ser: 0.78 mg/dL (ref 0.61–1.24)
GFR, Estimated: >60 mL/min (ref >60)
Glucose, Bld: 107 mg/dL — ABNORMAL HIGH (ref 70–99)
Potassium: 3.3 mmol/L — ABNORMAL LOW (ref 3.5–5.1)
Sodium: 133 mmol/L — ABNORMAL LOW (ref 135–145)

## 2022-10-07 MED ORDER — IPRATROPIUM-ALBUTEROL 0.5-2.5 (3) MG/3ML IN SOLN
3.0000 mL | Freq: Once | RESPIRATORY_TRACT | Status: AC
  Administered 2022-10-07: 3 mL via RESPIRATORY_TRACT
  Filled 2022-10-07: qty 3

## 2022-10-07 MED ORDER — POTASSIUM CHLORIDE CRYS ER 20 MEQ PO TBCR
40.0000 meq | EXTENDED_RELEASE_TABLET | Freq: Once | ORAL | Status: AC
  Administered 2022-10-07: 40 meq via ORAL
  Filled 2022-10-07: qty 2

## 2022-10-07 MED ORDER — AZITHROMYCIN 250 MG PO TABS: 250.0000 mg | ORAL_TABLET | Freq: Every day | ORAL | 0 refills | Status: DC

## 2022-10-07 MED ORDER — GUAIFENESIN-DM 100-10 MG/5ML PO SYRP: 5.0000 mL | ORAL_SOLUTION | ORAL | 0 refills | Status: DC | PRN

## 2022-10-07 MED ORDER — ALBUTEROL SULFATE HFA 108 (90 BASE) MCG/ACT IN AERS
1.0000 | INHALATION_SPRAY | RESPIRATORY_TRACT | Status: DC | PRN
  Administered 2022-10-07: 1 via RESPIRATORY_TRACT
  Filled 2022-10-07: qty 6.7

## 2022-10-07 MED ORDER — ALBUTEROL SULFATE HFA 108 (90 BASE) MCG/ACT IN AERS: 1.0000 | INHALATION_SPRAY | Freq: Four times a day (QID) | RESPIRATORY_TRACT | 1 refills | Status: DC | PRN

## 2022-10-07 MED ORDER — METHYLPREDNISOLONE SODIUM SUCC 125 MG IJ SOLR
62.5000 mg | Freq: Once | INTRAMUSCULAR | Status: AC
  Administered 2022-10-07: 62.5 mg via INTRAVENOUS
  Filled 2022-10-07: qty 2

## 2022-10-07 NOTE — ED Triage Notes (Signed)
Patient brought in by EMS due to shortness of breath. Pt wears home O2 at 2-3L all the time. Per EMS patient ran out of his oxygen about a week ago. Patient has history of COPD. Sickness noted at the house with wife and son. Productive cough noted by EMS.

## 2022-10-07 NOTE — Discharge Instructions (Addendum)
It is imperative that you stop smoking cigarettes  Please follow-up with your PCP in regards to oxygen tank refill  It was a pleasure caring for you today in the emergency department.  Please return to the emergency department for any worsening or worrisome symptoms.

## 2022-10-07 NOTE — ED Provider Notes (Signed)
[place] EMERGENCY DEPARTMENT AT [facility] Provider Note  CSN: [id number] Arrival date & time: 10/07/22 4098  Chief Complaint(s) Shortness of Breath  HPI Jaime Cline is a 62 y.o. 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  Patient reports he continues to smoke cigarettes, he ran out of his home inhalers and his home oxygen.  Reports worsening cough and difficulty breathing over the past few days.  Subjective fever at home last few days, no chills or bodyaches.  Cough productive with green/yellow phlegm.  Unable to ambulate more than 5 to 10 feet without becoming severely dyspneic without his oxygen.  Positive sick contacts of family member with URI.  Pt requesting SNF placement  Past Medical History Past Medical History:  Diagnosis Date   Alcohol abuse    Asthma    Bronchitis    Chronic back pain    Chronic knee pain    Hypertension    Patient Active Problem List   Diagnosis Date Noted   AKI (acute kidney injury) 08/18/2022   Metabolic acidosis, increased anion gap 08/18/2022   Depression 08/18/2022   Dyspepsia 08/18/2022   Sepsis due to pneumonia 08/17/2022   Dyspnea 04/12/2022   Palliative care encounter 04/11/2022   Goals of care, counseling/discussion 04/11/2022   Counseling and coordination of care 04/11/2022   COPD with acute exacerbation 04/10/2022   Syncope 09/28/2021   Protein-calorie malnutrition, severe 09/27/2021   Hyponatremia    Hypotension due to hypovolemia    Syncope and collapse 09/26/2021   CAP (community acquired pneumonia)    COPD exacerbation 08/27/2021   Asthma, chronic 12/31/2012   Smoker 12/31/2012   Knee pain, bilateral 12/31/2012   Iron deficiency anemia 12/31/2012   Knee pain, chronic 12/31/2012   Home  Medication(s) Prior to Admission medications   Medication Sig Start Date End Date Taking? Authorizing Provider  albuterol (VENTOLIN HFA) 108 (90 Base) MCG/ACT inhaler Inhale 1-2 puffs into the lungs every 6 (six) hours as needed for wheezing or shortness of breath. 10/07/22  Yes Tanda Rockers A, DO  azithromycin (ZITHROMAX) 250 MG tablet Take 1 tablet (250 mg total) by mouth daily. Take first 2 tablets together, then 1 every day until finished. 10/07/22  Yes Sloan Leiter, DO  acetaminophen (TYLENOL) 500 MG tablet Take 1,000 mg by mouth every 6 (six) hours as needed for mild pain.    [provider]  albuterol (PROVENTIL) (2.5 MG/3ML) 0.083% nebulizer solution Take 2.5 mg by nebulization every 8 (eight) hours as needed for wheezing or shortness of breath.    [provider]  albuterol (VENTOLIN HFA) 108 (90 Base) MCG/ACT inhaler Inhale 1-2 puffs into the lungs every 6 (six) hours as needed for wheezing. 01/27/22   Dimple Nanas, MD  Aspirin-Salicylamide-Caffeine (BC FAST PAIN RELIEF) 650-195-33.3 MG PACK Take 1 Package by mouth daily as needed (pain).    [provider]  atorvastatin (LIPITOR) 20 MG tablet Take 1 tablet (20 mg total) by mouth daily. 01/27/22   Amin, Loura Halt, MD  Fluticasone-Umeclidin-Vilant (TRELEGY ELLIPTA) 200-62.5-25 MCG/ACT AEPB Inhale 1 Inhalation into the lungs daily. 08/20/22   Adron Bene, MD  predniSONE (STERAPRED UNI-PAK 21 TAB) 10 MG (21) TBPK tablet Take by mouth daily. Take 6 tabs by mouth daily  for 2 days, then 5  tabs for 2 days, then 4 tabs for 2 days, then 3 tabs for 2 days, 2 tabs for 2 days, then 1 tab by mouth daily for 2 days 09/28/22   Gailen Shelter, PA  sertraline (ZOLOFT) 50 MG tablet Take 1 tablet (50 mg total) by mouth daily. 01/10/22   Garlon Hatchet, PA-C  traZODone (DESYREL) 50 MG tablet Take 1 tablet (50 mg total) by mouth at bedtime as needed for sleep. 04/19/22   Dorcas Carrow, MD  albuterol (PROVENTIL,VENTOLIN) 90  MCG/ACT inhaler Inhale 2 puffs into the lungs every 6 (six) hours as needed. Shortness of breath and wheezing   03/22/12  [provider]                                                                                                                                    Past Surgical History History reviewed. No pertinent surgical history. Family History Family History  Problem Relation Age of Onset   Cancer Mother    Cancer Sister     Social History Social History   Tobacco Use   Smoking status: Every Day    Packs/day: 1    Types: Cigarettes  Substance Use Topics   Alcohol use: Yes    Alcohol/week: 28.0 standard drinks of alcohol    Types: 28 Cans of beer per week   Drug use: No   Allergies Other  Review of Systems Review of Systems  Constitutional:  Positive for fatigue and fever. Negative for chills.  HENT:  Positive for congestion. Negative for facial swelling and trouble swallowing.   Eyes:  Negative for photophobia and visual disturbance.  Respiratory:  Positive for cough, chest tightness and shortness of breath.   Cardiovascular:  Negative for chest pain and palpitations.  Gastrointestinal:  Negative for abdominal pain, nausea and vomiting.  Endocrine: Negative for polydipsia and polyuria.  Genitourinary:  Negative for difficulty urinating and hematuria.  Musculoskeletal:  Negative for gait problem and joint swelling.  Skin:  Negative for pallor and rash.  Neurological:  Negative for syncope and headaches.  Psychiatric/Behavioral:  Negative for agitation and confusion.     Physical Exam Vital Signs  I have reviewed the triage vital signs BP 114/78   Pulse (!) 107   Temp 98.4 F (36.9 C) (Oral)   Resp 20   Ht  (1.778 m)   Wt 40.8 kg   SpO2 97%   BMI 12.91 kg/m  Physical Exam Vitals and nursing note reviewed.  Constitutional:      General: He is not in acute distress.    Appearance: He is well-developed.  HENT:     Head: Normocephalic and  atraumatic.     Right Ear: External ear normal.     Left Ear: External ear normal.     Mouth/Throat:     Mouth: Mucous membranes are moist.  Eyes:     General: No scleral  icterus. Cardiovascular:     Rate and Rhythm: Normal rate and regular rhythm.     Pulses: Normal pulses.     Heart sounds: Normal heart sounds.  Pulmonary:     Effort: Pulmonary effort is normal. Tachypnea present. No respiratory distress.     Breath sounds: Wheezing present.  Abdominal:     General: Abdomen is flat.     Palpations: Abdomen is soft.     Tenderness: There is no abdominal tenderness.  Musculoskeletal:     Right lower leg: No edema.     Left lower leg: No edema.  Skin:    General: Skin is warm and dry.     Capillary Refill: Capillary refill takes less than 2 seconds.  Neurological:     Mental Status: He is alert and oriented to person, place, and time.  Psychiatric:        Mood and Affect: Mood normal.        Behavior: Behavior normal.     ED Results and Treatments Labs (all labs ordered are listed, but only abnormal results are displayed) Labs Reviewed  BASIC METABOLIC PANEL - Abnormal; Notable for the following components:      Result Value   Sodium 133 (*)    Potassium 3.3 (*)    Glucose, Bld 107 (*)    All other components within normal limits  CBC WITH DIFFERENTIAL/PLATELET - Abnormal; Notable for the following components:   RBC 4.17 (*)    Monocytes Absolute 1.3 (*)    All other components within normal limits  BLOOD GAS, VENOUS - Abnormal; Notable for the following components:   pO2, Ven <31 (*)    Bicarbonate 30.4 (*)    Acid-Base Excess 3.4 (*)    All other components within normal limits  SARS CORONAVIRUS 2 BY RT PCR  BRAIN NATRIURETIC PEPTIDE  TROPONIN I (HIGH SENSITIVITY)  TROPONIN I (HIGH SENSITIVITY)                                                                                                                          Radiology DG Chest Port 1 View  Result  Date: 10/07/2022 CLINICAL DATA:  coughj EXAM: PORTABLE CHEST - 1 VIEW COMPARISON:  09/27/2022 FINDINGS: Pulmonary hyperinflation with attenuated bronchovascular markings as before. No focal infiltrate or overt edema. Heart size and mediastinal contours are within normal limits. Aortic Atherosclerosis (ICD10-170.0). No effusion. Visualized bones unremarkable. IMPRESSION: Pulmonary hyperinflation without acute findings. Electronically Signed   By: Corlis Leak M.D.   On: 10/07/2022 08:50    Pertinent labs & imaging results that were available during my care of the patient were reviewed by me and considered in my medical decision making (see MDM for details).  Medications Ordered in ED Medications  albuterol (VENTOLIN HFA) 108 (90 Base) MCG/ACT inhaler 1-2 puff (has no administration in time range)  ipratropium-albuterol (DUONEB) 0.5-2.5 (3) MG/3ML nebulizer solution 3 mL (3 mLs Nebulization Given 10/07/22 0843)  methylPREDNISolone sodium succinate (SOLU-MEDROL)  125 mg/2 mL injection 62.5 mg (62.5 mg Intravenous Given 10/07/22 0837)  ipratropium-albuterol (DUONEB) 0.5-2.5 (3) MG/3ML nebulizer solution 3 mL (3 mLs Nebulization Given 10/07/22 1032)  potassium chloride SA (KLOR-CON M) CR tablet 40 mEq (40 mEq Oral Given 10/07/22 1044)                                                                                                                                     Procedures Procedures  (including critical care time)  Medical Decision Making / ED Course    Medical Decision Making:    ASMAR BROZEK is a 62 y.o. 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 The complaint involves an extensive differential diagnosis and also carries with it a high risk of complications and morbidity.   Serious etiology was considered. Ddx includes but is not limited to: In my evaluation of this patient's dyspnea my DDx includes, but is not limited to, pneumonia, pulmonary embolism, pneumothorax, pulmonary edema, metabolic acidosis, asthma, COPD, cardiac cause, anemia, anxiety, etc.    Complete initial physical exam performed, notably the patient  was tachypneic, wheezing throughout, hypoxic on room air, placed on 3 L nasal cannula with improvement of respiratory status.    Reviewed and confirmed nursing documentation for past medical history, family history, social history.  Vital signs reviewed.    Clinical Course as of 10/07/22 1343  Sat Oct 07, 2022  1032 Potassium(!): 3.3 Replaced orally [SG]  1211 Feeling better on recheck, will trial off cannula  [SG]  1228 Pulse ox 95-97% on room air, on recheck with pt he only uses the cannula PRN [SG]    Clinical Course User Index [SG] Sloan Leiter, DO   Patient is feeling much better on recheck, wheezing has nearly resolved after nebulized breathing treatments and steroids.  He is breathing comfortably on ambient air.  Labs stable, chest x-ray without acute infiltrate.  Spouse and son at home to help.  Advised to follow-up with PCP in regards to seeking SNF placement in the future if he still wants this  BNP is not resulted, he does not appear to be in acute CHF exacerbation or volume overload at this time.  Presentation today likely secondary to acute on chronic COPD exacerbation.  Given refill on bronchodilators, he is currently taking prednisone.  Will also give azithromycin, advised follow-up with pulmonology and PCP.  Smoking cessation was encouraged  The patient improved significantly and was discharged in stable condition. Detailed discussions were had with the patient regarding current findings, and need for close f/u with PCP or on call doctor. The patient has been instructed to return immediately if the symptoms worsen in any way for  re-evaluation. Patient verbalized understanding and is  in agreement with current care plan. All questions answered prior to discharge.    Additional history obtained: -Additional history obtained from na -External records from outside source obtained and reviewed including: Chart review including previous notes, labs, imaging, consultation notes including prior ED visits, prior admission, prior labs and imaging Multiple ED visits for COPD exacerbation noted Last evaluation was 4/10 in the emergency department, was discharged after nebulized breathing treatments steroids mag and improvement to his respiratory status  Lab Tests: -I ordered, reviewed, and interpreted labs.   The pertinent results include:   Labs Reviewed  BASIC METABOLIC PANEL - Abnormal; Notable for the following components:      Result Value   Sodium 133 (*)    Potassium 3.3 (*)    Glucose, Bld 107 (*)    All other components within normal limits  CBC WITH DIFFERENTIAL/PLATELET - Abnormal; Notable for the following components:   RBC 4.17 (*)    Monocytes Absolute 1.3 (*)    All other components within normal limits  BLOOD GAS, VENOUS - Abnormal; Notable for the following components:   pO2, Ven <31 (*)    Bicarbonate 30.4 (*)    Acid-Base Excess 3.4 (*)    All other components within normal limits  SARS CORONAVIRUS 2 BY RT PCR  BRAIN NATRIURETIC PEPTIDE  TROPONIN I (HIGH SENSITIVITY)  TROPONIN I (HIGH SENSITIVITY)    Notable for as above  EKG   EKG Interpretation  Date/Time:  Saturday October 07 2022 07:59:08 EDT Ventricular Rate:  99 PR Interval:  120 QRS Duration: 87 QT Interval:  340 QTC Calculation: 437 R Axis:   87 Text Interpretation: Sinus rhythm Right atrial enlargement Borderline right axis deviation Abnormal R-wave progression, late transition Nonspecific T abnrm, anterolateral leads Interpretation limited secondary to artifact similar to prior no stemi Confirmed by Tanda Rockers ([id number]) on 10/07/2022  10:33:05 AM         Imaging Studies ordered: I ordered imaging studies including cxr I independently visualized the following imaging with scope of interpretation limited to determining acute life threatening conditions related to emergency care; findings noted above, significant for stable I independently visualized and interpreted imaging. I agree with the radiologist interpretation   Medicines ordered and prescription drug management: Meds ordered this encounter  Medications   ipratropium-albuterol (DUONEB) 0.5-2.5 (3) MG/3ML nebulizer solution 3 mL   methylPREDNISolone sodium succinate (SOLU-MEDROL) 125 mg/2 mL injection 62.5 mg   ipratropium-albuterol (DUONEB) 0.5-2.5 (3) MG/3ML nebulizer solution 3 mL   potassium chloride SA (KLOR-CON M) CR tablet 40 mEq   albuterol (VENTOLIN HFA) 108 (90 Base) MCG/ACT inhaler    Sig: Inhale 1-2 puffs into the lungs every 6 (six) hours as needed for wheezing or shortness of breath.    Dispense:  1 each    Refill:  1   azithromycin (ZITHROMAX) 250 MG tablet    Sig: Take 1 tablet (250 mg total) by mouth daily. Take first 2 tablets together, then 1 every day until finished.    Dispense:  6 tablet    Refill:  0   albuterol (VENTOLIN HFA) 108 (90 Base) MCG/ACT inhaler 1-2 puff    -I have reviewed the patients home medicines and have made adjustments as needed   Consultations Obtained: na   Cardiac Monitoring: The patient was maintained on a cardiac monitor.  I personally viewed and interpreted the cardiac monitored which showed an underlying rhythm of: NSR  Social Determinants of Health:  Diagnosis or treatment significantly limited by social  determinants of health: current smoker Counseled patient for approximately 2 minutes regarding smoking cessation. Discussed risks of smoking and how they applied and affected their visit here today. Patient not ready to quit at this time, however will follow up with their primary doctor when they are.    CPT code: 16109: intermediate counseling for smoking cessation     Reevaluation: After the interventions noted above, I reevaluated the patient and found that they have improved  Co morbidities that complicate the patient evaluation  Past Medical History:  Diagnosis Date   Alcohol abuse    Asthma    Bronchitis    Chronic back pain    Chronic knee pain    Hypertension       Dispostion: Disposition decision including need for hospitalization was considered, and patient discharged from emergency department.    Final Clinical Impression(s) / ED Diagnoses Final diagnoses:  COPD with acute exacerbation  Chronic cough     This chart was dictated using voice recognition software.  Despite best efforts to proofread,  errors can occur which can change the documentation meaning.    Sloan Leiter, DO 10/07/22 1343

## 2022-10-08 ENCOUNTER — Other Ambulatory Visit: Payer: Self-pay

## 2022-10-08 ENCOUNTER — Inpatient Hospital Stay (HOSPITAL_COMMUNITY)
Admission: EM | Admit: 2022-10-08 | Discharge: 2022-10-17 | DRG: 190 | Disposition: A | Discharge status: Hospice | Payer: Medicaid Other | Attending: Internal Medicine | Admitting: Internal Medicine

## 2022-10-08 ENCOUNTER — Emergency Department (HOSPITAL_COMMUNITY): Payer: Medicaid Other

## 2022-10-08 DIAGNOSIS — J441 Chronic obstructive pulmonary disease with (acute) exacerbation: Secondary | ICD-10-CM | POA: Diagnosis not present

## 2022-10-08 DIAGNOSIS — R54 Age-related physical debility: Secondary | ICD-10-CM | POA: Diagnosis present

## 2022-10-08 DIAGNOSIS — F32A Depression, unspecified: Secondary | ICD-10-CM | POA: Diagnosis present

## 2022-10-08 DIAGNOSIS — Z66 Do not resuscitate: Secondary | ICD-10-CM | POA: Diagnosis present

## 2022-10-08 DIAGNOSIS — I10 Essential (primary) hypertension: Secondary | ICD-10-CM | POA: Diagnosis present

## 2022-10-08 DIAGNOSIS — E871 Hypo-osmolality and hyponatremia: Secondary | ICD-10-CM | POA: Diagnosis present

## 2022-10-08 DIAGNOSIS — Z7951 Long term (current) use of inhaled steroids: Secondary | ICD-10-CM

## 2022-10-08 DIAGNOSIS — J440 Chronic obstructive pulmonary disease with acute lower respiratory infection: Secondary | ICD-10-CM | POA: Diagnosis present

## 2022-10-08 DIAGNOSIS — R627 Adult failure to thrive: Secondary | ICD-10-CM | POA: Diagnosis present

## 2022-10-08 DIAGNOSIS — Z1152 Encounter for screening for COVID-19: Secondary | ICD-10-CM

## 2022-10-08 DIAGNOSIS — F419 Anxiety disorder, unspecified: Secondary | ICD-10-CM | POA: Diagnosis present

## 2022-10-08 DIAGNOSIS — F17210 Nicotine dependence, cigarettes, uncomplicated: Secondary | ICD-10-CM | POA: Diagnosis present

## 2022-10-08 DIAGNOSIS — E43 Unspecified severe protein-calorie malnutrition: Secondary | ICD-10-CM | POA: Diagnosis present

## 2022-10-08 DIAGNOSIS — J189 Pneumonia, unspecified organism: Secondary | ICD-10-CM

## 2022-10-08 DIAGNOSIS — R64 Cachexia: Secondary | ICD-10-CM | POA: Diagnosis present

## 2022-10-08 DIAGNOSIS — Z79899 Other long term (current) drug therapy: Secondary | ICD-10-CM

## 2022-10-08 DIAGNOSIS — Z9981 Dependence on supplemental oxygen: Secondary | ICD-10-CM

## 2022-10-08 DIAGNOSIS — J9621 Acute and chronic respiratory failure with hypoxia: Secondary | ICD-10-CM | POA: Diagnosis present

## 2022-10-08 DIAGNOSIS — B9789 Other viral agents as the cause of diseases classified elsewhere: Secondary | ICD-10-CM | POA: Diagnosis present

## 2022-10-08 DIAGNOSIS — R5381 Other malaise: Secondary | ICD-10-CM | POA: Diagnosis present

## 2022-10-08 DIAGNOSIS — Z515 Encounter for palliative care: Secondary | ICD-10-CM

## 2022-10-08 DIAGNOSIS — J159 Unspecified bacterial pneumonia: Secondary | ICD-10-CM | POA: Diagnosis present

## 2022-10-08 DIAGNOSIS — Z681 Body mass index (BMI) 19 or less, adult: Secondary | ICD-10-CM

## 2022-10-08 DIAGNOSIS — Z7982 Long term (current) use of aspirin: Secondary | ICD-10-CM

## 2022-10-08 DIAGNOSIS — D649 Anemia, unspecified: Secondary | ICD-10-CM | POA: Diagnosis present

## 2022-10-08 LAB — RESPIRATORY PANEL BY PCR

## 2022-10-08 LAB — CBC WITH DIFFERENTIAL/PLATELET
Abs Immature Granulocytes: 0.01 10*3/uL (ref 0.00–0.07)
Basophils Absolute: 0 10*3/uL (ref 0.0–0.1)
Basophils Relative: 0 %
Eosinophils Absolute: 0 10*3/uL (ref 0.0–0.5)
Eosinophils Relative: 0 %
HCT: 38.9 % — ABNORMAL LOW (ref 39.0–52.0)
Hemoglobin: 12.8 g/dL — ABNORMAL LOW (ref 13.0–17.0)
Immature Granulocytes: 0 %
Lymphocytes Relative: 11 %
Lymphs Abs: 0.6 10*3/uL — ABNORMAL LOW (ref 0.7–4.0)
MCH: 32.9 pg (ref 26.0–34.0)
MCHC: 32.9 g/dL (ref 30.0–36.0)
MCV: 100 fL (ref 80.0–100.0)
Monocytes Absolute: 1.2 10*3/uL — ABNORMAL HIGH (ref 0.1–1.0)
Monocytes Relative: 24 %
Neutro Abs: 3.4 10*3/uL (ref 1.7–7.7)
Neutrophils Relative %: 65 %
Platelets: 203 10*3/uL (ref 150–400)
RBC: 3.89 MIL/uL — ABNORMAL LOW (ref 4.22–5.81)
RDW: 15.3 % (ref 11.5–15.5)
WBC: 5.2 10*3/uL (ref 4.0–10.5)
nRBC: 0 % (ref 0.0–0.2)

## 2022-10-08 LAB — RESP PANEL BY RT-PCR (RSV, FLU A&B, COVID)  RVPGX2
Influenza A by PCR: NEGATIVE
Influenza B by PCR: NEGATIVE
Resp Syncytial Virus by PCR: NEGATIVE
SARS Coronavirus 2 by RT PCR: NEGATIVE

## 2022-10-08 LAB — BASIC METABOLIC PANEL
Anion gap: 12 (ref 5–15)
BUN: 13 mg/dL (ref 8–23)
CO2: 22 mmol/L (ref 22–32)
Calcium: 9.1 mg/dL (ref 8.9–10.3)
Chloride: 99 mmol/L (ref 98–111)
Creatinine, Ser: 0.69 mg/dL (ref 0.61–1.24)
GFR, Estimated: >60 mL/min (ref >60)
Glucose, Bld: 95 mg/dL (ref 70–99)
Potassium: 3.5 mmol/L (ref 3.5–5.1)
Sodium: 133 mmol/L — ABNORMAL LOW (ref 135–145)

## 2022-10-08 LAB — STREP PNEUMONIAE URINARY ANTIGEN: Strep Pneumo Urinary Antigen: NEGATIVE

## 2022-10-08 LAB — BRAIN NATRIURETIC PEPTIDE: B Natriuretic Peptide: 109.6 pg/mL — ABNORMAL HIGH (ref 0.0–100.0)

## 2022-10-08 MED ORDER — ALBUTEROL SULFATE (2.5 MG/3ML) 0.083% IN NEBU
2.5000 mg | INHALATION_SOLUTION | RESPIRATORY_TRACT | Status: DC | PRN
  Administered 2022-10-10 – 2022-10-17 (×4): 2.5 mg via RESPIRATORY_TRACT
  Filled 2022-10-08 (×4): qty 3

## 2022-10-08 MED ORDER — AZITHROMYCIN 250 MG PO TABS
250.0000 mg | ORAL_TABLET | Freq: Once | ORAL | Status: AC
  Administered 2022-10-08: 250 mg via ORAL
  Filled 2022-10-08: qty 1

## 2022-10-08 MED ORDER — SODIUM CHLORIDE 0.9 % IV SOLN
2.0000 g | INTRAVENOUS | Status: AC
  Administered 2022-10-09 – 2022-10-13 (×5): 2 g via INTRAVENOUS
  Filled 2022-10-08 (×5): qty 20

## 2022-10-08 MED ORDER — METHYLPREDNISOLONE SODIUM SUCC 125 MG IJ SOLR
125.0000 mg | Freq: Once | INTRAMUSCULAR | Status: AC
  Administered 2022-10-08: 125 mg via INTRAVENOUS
  Filled 2022-10-08: qty 2

## 2022-10-08 MED ORDER — METHYLPREDNISOLONE SODIUM SUCC 125 MG IJ SOLR
80.0000 mg | Freq: Two times a day (BID) | INTRAMUSCULAR | Status: DC
  Administered 2022-10-09 – 2022-10-10 (×3): 80 mg via INTRAVENOUS
  Filled 2022-10-08 (×3): qty 2

## 2022-10-08 MED ORDER — SODIUM CHLORIDE 0.9 % IV SOLN
1.0000 g | Freq: Once | INTRAVENOUS | Status: AC
  Administered 2022-10-08: 1 g via INTRAVENOUS
  Filled 2022-10-08: qty 10

## 2022-10-08 MED ORDER — ONDANSETRON HCL 4 MG PO TABS
4.0000 mg | ORAL_TABLET | Freq: Four times a day (QID) | ORAL | Status: DC | PRN
  Administered 2022-10-10: 4 mg via ORAL
  Filled 2022-10-08: qty 1

## 2022-10-08 MED ORDER — GUAIFENESIN ER 600 MG PO TB12
1200.0000 mg | ORAL_TABLET | Freq: Two times a day (BID) | ORAL | Status: DC
  Administered 2022-10-08 – 2022-10-17 (×18): 1200 mg via ORAL
  Filled 2022-10-08 (×18): qty 2

## 2022-10-08 MED ORDER — MOMETASONE FURO-FORMOTEROL FUM 200-5 MCG/ACT IN AERO
2.0000 | INHALATION_SPRAY | Freq: Two times a day (BID) | RESPIRATORY_TRACT | Status: DC
  Administered 2022-10-08 – 2022-10-17 (×18): 2 via RESPIRATORY_TRACT
  Filled 2022-10-08 (×5): qty 8.8

## 2022-10-08 MED ORDER — ACETAMINOPHEN 650 MG RE SUPP: 650.0000 mg | Freq: Four times a day (QID) | RECTAL | Status: DC | PRN

## 2022-10-08 MED ORDER — ENOXAPARIN SODIUM 30 MG/0.3ML IJ SOSY
30.0000 mg | PREFILLED_SYRINGE | INTRAMUSCULAR | Status: DC
  Administered 2022-10-08 – 2022-10-11 (×4): 30 mg via SUBCUTANEOUS
  Filled 2022-10-08 (×4): qty 0.3

## 2022-10-08 MED ORDER — IPRATROPIUM-ALBUTEROL 0.5-2.5 (3) MG/3ML IN SOLN
3.0000 mL | Freq: Once | RESPIRATORY_TRACT | Status: AC
  Administered 2022-10-08: 3 mL via RESPIRATORY_TRACT
  Filled 2022-10-08: qty 3

## 2022-10-08 MED ORDER — SODIUM CHLORIDE 0.9 % IV SOLN
500.0000 mg | INTRAVENOUS | Status: DC
  Administered 2022-10-09 – 2022-10-10 (×2): 500 mg via INTRAVENOUS
  Filled 2022-10-08 (×2): qty 5

## 2022-10-08 MED ORDER — IPRATROPIUM-ALBUTEROL 0.5-2.5 (3) MG/3ML IN SOLN
3.0000 mL | Freq: Four times a day (QID) | RESPIRATORY_TRACT | Status: DC
  Administered 2022-10-08 – 2022-10-14 (×24): 3 mL via RESPIRATORY_TRACT
  Filled 2022-10-08 (×23): qty 3

## 2022-10-08 MED ORDER — ONDANSETRON HCL 4 MG/2ML IJ SOLN: 4.0000 mg | Freq: Four times a day (QID) | INTRAMUSCULAR | Status: DC | PRN

## 2022-10-08 MED ORDER — ACETAMINOPHEN 325 MG PO TABS
650.0000 mg | ORAL_TABLET | Freq: Four times a day (QID) | ORAL | Status: DC | PRN
  Administered 2022-10-12 – 2022-10-15 (×3): 650 mg via ORAL
  Filled 2022-10-08 (×4): qty 2

## 2022-10-08 NOTE — H&P (Signed)
History and Physical    Jaime Cline:[id number] DOB: 1960/09/13 DOA: 10/08/2022  PCP: Patient, [name]   Patient coming from: Home  I have personally briefly reviewed patient's old medical records in [facility]  Chief Complaint: shortness of breath  HPI: Jaime Cline is a 62 y.o. male with medical history significant of COPD on home oxygen via nasal cannula 2 to 3 L/min, chronic tobacco use, alcohol abuse, hypertension, chronic back pain, malnutrition, recent admission from 08/17/2022-08/20/2022 for COPD exacerbation and pneumonia presented with worsening shortness of breath.  Patient continues to smoke cigarettes.  Patient was seen in the ED yesterday for worsening shortness of breath, chest x-ray was clear and was sent back home.  Patient continues to have worsening shortness of breath with cough, yellow-green sputum.  He gets very short winded even after walking 5 to 10 feet.  He has not had his home medications for 2 weeks apparently.  Denies any fever, nausea, vomiting, chest pain, palpitations, loss of consciousness, seizures, diarrhea, dysuria, recent travels.  ED Course: He initially needed 10 L oxygen via nasal cannula.  Chest x-ray showed possible new patchy right basilar opacity, suspicious for pneumonia.  He was started on Rocephin, azithromycin and also IV Solu-Medrol was given. Hospitalist service was called to evaluate the patient.  Review of Systems: As per HPI otherwise all other systems were reviewed and are negative.   Past Medical History:  Diagnosis Date   Alcohol abuse    Asthma    Bronchitis    Chronic back pain    Chronic knee pain    Hypertension     No past surgical history on file.   reports that he has been smoking cigarettes. He has been smoking an average of 1 pack per day. He does not have any smokeless tobacco history on file. He reports current alcohol use of about 28.0 standard drinks of alcohol per week. He reports that he does not use  drugs.  Allergies  Allergen Reactions   Other Other (See Comments)    BP med from [facility] about 3 years ago, caused some seizures, due to getting wrong BP med at that time.  Unknown name of med    Family History  Problem Relation Age of Onset   Cancer Mother    Cancer Sister     Prior to Admission medications   Medication Sig Start Date End Date Taking? Authorizing Provider  acetaminophen (TYLENOL) 500 MG tablet Take 1,000 mg by mouth every 6 (six) hours as needed for mild pain.    [provider]  albuterol (PROVENTIL) (2.5 MG/3ML) 0.083% nebulizer solution Take 2.5 mg by nebulization every 8 (eight) hours as needed for wheezing or shortness of breath.    [provider]  albuterol (VENTOLIN HFA) 108 (90 Base) MCG/ACT inhaler Inhale 1-2 puffs into the lungs every 6 (six) hours as needed for wheezing. 01/27/22   Amin, Loura Halt, MD  albuterol (VENTOLIN HFA) 108 (90 Base) MCG/ACT inhaler Inhale 1-2 puffs into the lungs every 6 (six) hours as needed for wheezing or shortness of breath. 10/07/22   Sloan Leiter, DO  Aspirin-Salicylamide-Caffeine (BC FAST PAIN RELIEF) [phone] MG PACK Take 1 Package by mouth daily as needed (pain).    [provider]  atorvastatin (LIPITOR) 20 MG tablet Take 1 tablet (20 mg total) by mouth daily. 01/27/22   Amin, Loura Halt, MD  azithromycin (ZITHROMAX) 250 MG tablet Take 1 tablet (250 mg total) by mouth  daily. Take first 2 tablets together, then 1 every day until finished. 10/07/22   Sloan Leiter, DO  Fluticasone-Umeclidin-Vilant (TRELEGY ELLIPTA) 200-62.5-25 MCG/ACT AEPB Inhale 1 Inhalation into the lungs daily. 08/20/22   Adron Bene, MD  guaiFENesin-dextromethorphan (ROBITUSSIN DM) 100-10 MG/5ML syrup Take 5 mLs by mouth every 4 (four) hours as needed for cough. 10/07/22   Sloan Leiter, DO  predniSONE (STERAPRED UNI-PAK 21 TAB) 10 MG (21) TBPK tablet Take by mouth daily. Take 6 tabs by mouth daily  for 2 days, then 5  tabs for 2 days, then 4 tabs for 2 days, then 3 tabs for 2 days, 2 tabs for 2 days, then 1 tab by mouth daily for 2 days 09/28/22   Gailen Shelter, PA  sertraline (ZOLOFT) 50 MG tablet Take 1 tablet (50 mg total) by mouth daily. 01/10/22   Garlon Hatchet, PA-C  traZODone (DESYREL) 50 MG tablet Take 1 tablet (50 mg total) by mouth at bedtime as needed for sleep. 04/19/22   Dorcas Carrow, MD  albuterol (PROVENTIL,VENTOLIN) 90 MCG/ACT inhaler Inhale 2 puffs into the lungs every 6 (six) hours as needed. Shortness of breath and wheezing   03/22/12  [provider]    Physical Exam: Vitals:   10/08/22 1430 10/08/22 1445 10/08/22 1500 10/08/22 1600  BP:  121/80 (!) 119/91 116/83  Pulse: (!) 101 (!) 102 (!) 30 93  Resp: Temp:      TempSrc:      SpO2: 100% 98% 100% 100%    Constitutional: NAD, calm, comfortable.  Looks chronically ill and deconditioned.  Currently on 3 L oxygen by nasal cannula.  Extremely thinly built. Vitals:   10/08/22 1430 10/08/22 1445 10/08/22 1500 10/08/22 1600  BP:  121/80 (!) 119/91 116/83  Pulse: (!) 101 (!) 102 (!) 30 93  Resp: Temp:      TempSrc:      SpO2: 100% 98% 100% 100%   Eyes: PERRL, lids and conjunctivae normal ENMT: Mucous membranes are moist. Posterior pharynx clear of any exudate or lesions. Neck: normal, supple, no masses, no thyromegaly Respiratory: bilateral decreased breath sounds at bases with scattered wheezing and crackles.  Intermittently tachypneic.  No accessory muscle use.  Cardiovascular: S1 S2 positive, intermittently tachycardic.  No extremity edema. 2+ pedal pulses.  Abdomen: no tenderness, no masses palpated. No hepatosplenomegaly. Bowel sounds positive.  Musculoskeletal: no clubbing / cyanosis. No joint deformity upper and lower extremities.  Skin: no rashes, lesions, ulcers. No induration Neurologic: CN 2-12 grossly intact. Moving extremities. No focal neurologic deficits.  Psychiatric: Flat affect.   Not agitated.   Labs on Admission: I have personally reviewed following labs and imaging studies  CBC: Recent Labs  Lab 10/07/22 0815 10/08/22 1400  WBC 8.3 5.2  NEUTROABS 6.1 3.4  HGB 13.8 12.8*  HCT 41.6 38.9*  MCV 99.8 100.0  PLT 180 203   Basic Metabolic Panel: Recent Labs  Lab 10/07/22 0815 10/08/22 1400  NA 133* 133*  K 3.3* 3.5  CL 98 99  CO2 23 22  GLUCOSE 107* 95  BUN 18 13  CREATININE 0.78 0.69  CALCIUM 8.9 9.1   GFR: Estimated Creatinine Clearance: 56 mL/min (by C-G formula based on SCr of 0.69 mg/dL). Liver Function Tests: No results for input(s): "AST", "ALT", "ALKPHOS", "BILITOT", "PROT", "ALBUMIN" in the last 168 hours. No results for input(s): "LIPASE", "AMYLASE" in the last 168 hours. No results for input(s): "AMMONIA"  in the last 168 hours. Coagulation Profile: No results for input(s): "INR", "PROTIME" in the last 168 hours. Cardiac Enzymes: No results for input(s): "CKTOTAL", "CKMB", "CKMBINDEX", "TROPONINI" in the last 168 hours. BNP (last 3 results) No results for input(s): "PROBNP" in the last 8760 hours. HbA1C: No results for input(s): "HGBA1C" in the last 72 hours. CBG: No results for input(s): "GLUCAP" in the last 168 hours. Lipid Profile: No results for input(s): "CHOL", "HDL", "LDLCALC", "TRIG", "CHOLHDL", "LDLDIRECT" in the last 72 hours. Thyroid Function Tests: No results for input(s): "TSH", "T4TOTAL", "FREET4", "T3FREE", "THYROIDAB" in the last 72 hours. Anemia Panel: No results for input(s): "VITAMINB12", "FOLATE", "FERRITIN", "TIBC", "IRON", "RETICCTPCT" in the last 72 hours. Urine analysis:    Component Value Date/Time   COLORURINE YELLOW 08/17/2022 1252   APPEARANCEUR CLEAR 08/17/2022 1252   LABSPEC <1.005 (L) 08/17/2022 1252   PHURINE 6.0 08/17/2022 1252   GLUCOSEU NEGATIVE 08/17/2022 1252   HGBUR NEGATIVE 08/17/2022 1252   BILIRUBINUR NEGATIVE 08/17/2022 1252   KETONESUR NEGATIVE 08/17/2022 1252   PROTEINUR NEGATIVE  08/17/2022 1252   UROBILINOGEN 0.2 03/27/2014 0158   NITRITE NEGATIVE 08/17/2022 1252   LEUKOCYTESUR NEGATIVE 08/17/2022 1252    Radiological Exams on Admission: DG Chest 2 View  Result Date: 10/08/2022 CLINICAL DATA:  Shortness of breath EXAM: CHEST - 2 VIEW COMPARISON:  10/07/2022 FINDINGS: The heart size and mediastinal contours are within normal limits. Hyperinflated, hyperlucent lungs. New patchy right basilar opacity. Left lung is clear. No pleural effusion or pneumothorax. The visualized skeletal structures are unremarkable. IMPRESSION: 1. New patchy right basilar opacity, suspicious for pneumonia. 2. COPD. Electronically Signed   By: Duanne Guess D.O.   On: 10/08/2022 14:29   DG Chest Port 1 View  Result Date: 10/07/2022 CLINICAL DATA:  coughj EXAM: PORTABLE CHEST - 1 VIEW COMPARISON:  09/27/2022 FINDINGS: Pulmonary hyperinflation with attenuated bronchovascular markings as before. No focal infiltrate or overt edema. Heart size and mediastinal contours are within normal limits. Aortic Atherosclerosis (ICD10-170.0). No effusion. Visualized bones unremarkable. IMPRESSION: Pulmonary hyperinflation without acute findings. Electronically Signed   By: Corlis Leak M.D.   On: 10/07/2022 08:50     Assessment/Plan  Possible community-acquired right basilar bacterial pneumonia COPD exacerbation Acute on chronic respiratory failure with hypoxia Tobacco abuse ongoing Severe malnutrition/failure to thrive -Patient presented with worsening dyspnea and was found to be tachypneic, tachycardic and required 10 L oxygen via nasal cannula initially in the ED.  Normally uses 2 to 3 L oxygen via nasal cannula.  Patient continues to smoke cigarettes. -Chest x-ray showed possible right basilar pneumonia.  Will continue Rocephin and Zithromax.  Continue Solu-Medrol along with nebs and Dulera. -Counseled regarding tobacco cessation -Nutrition consult.  Palliative care consultation for goals of care  discussion: Mild qualify for home hospice.  Overall prognosis is very poor.  Hyponatremia -Mild.  Monitor  Normocytic anemia -Questionable cause.  Hemoglobin currently stable.  Monitor intermittently.  Depression -Resume home regimen once medications have been verified  DVT prophylaxis: Lovenox Code Status: DNR.  Patient has been DNR during prior admissions as well Family Communication: None at bedside Disposition Plan: Home in 1 to 2 days once clinically improved Consults called: Consult palliative care Admission status: Observation/telemetry  Severity of Illness: The appropriate patient status for this patient is OBSERVATION. Observation status is judged to be reasonable and necessary in order to provide the required intensity of service to ensure the patient's safety. The patient's presenting symptoms, physical exam findings, and initial radiographic and laboratory  data in the context of their medical condition is felt to place them at decreased risk for further clinical deterioration. Furthermore, it is anticipated that the patient will be medically stable for discharge from the hospital within 2 midnights of admission.     Glade Lloyd MD [facility]  10/08/2022, 5:18 PM

## 2022-10-08 NOTE — ED Triage Notes (Signed)
Patient BIB EMS from home with SOB x1wk, out of breathing tx medication x1wk. H/O CHF/COPD. Patient wears home O2 2-3liters at home. 154/115, 110, 98% with neb tx. CBG 143.

## 2022-10-08 NOTE — ED Provider Notes (Signed)
Patient's care assumed at 3:00 from [facility], [place].  Patient has a history of COPD.  Patient was seen yesterday for similar symptoms.  Patient became increasingly short of breath today.  Patient was recently diagnosed with right lower lobe pneumonia.  Patient reports he does not have any medications at home for his COPD.  Patient tells me that he has been losing weight.  Patient was given a breathing treatment here and reports he is feeling some better.  Patient's initial O2 sat was 81%.  He is currently 100% on 3 L.  Physical exam on vitals are stable lungs patient has decreased breath sounds with rhonchi faint wheezing.  unasigned medicine consulted for admission   Osie Cheeks 10/08/22 1642    Cardama, Amadeo Garnet, MD 10/09/22 [id number]

## 2022-10-08 NOTE — ED Provider Notes (Signed)
EMERGENCY DEPARTMENT AT [facility] Provider Note  CSN: [id number] Arrival date & time: 10/08/22 1331  Chief Complaint(s) Shortness of Breath  HPI Jaime Cline is a 62 y.o. male with past medical history as below, significant for chronic alcohol abuse, chronic tobacco use, COPD on home oxygen dependence 2 to 3 L, hypertension, chronic back pain, malnutrition, who presents to the ED with complaint of cough, difficulty breathing.  Patient reports he continues to smoke cigarettes. He states that he has not had his home medications for two weeks due to no refills being sent to the pharmacy.  Reports worsening cough and difficulty breathing over the past few days.  Denies fever.  Cough productive with green/yellow phlegm.  Unable to ambulate more than 5 to 10 feet without becoming severely dyspneic without his oxygen.  Positive sick contacts of family member with URI.  Past Medical History Past Medical History:  Diagnosis Date   Alcohol abuse    Asthma    Bronchitis    Chronic back pain    Chronic knee pain    Hypertension    Patient Active Problem List   Diagnosis Date Noted   AKI (acute kidney injury) 08/18/2022   Metabolic acidosis, increased anion gap 08/18/2022   Depression 08/18/2022   Dyspepsia 08/18/2022   Sepsis due to pneumonia 08/17/2022   Dyspnea 04/12/2022   Palliative care encounter 04/11/2022   Goals of care, counseling/discussion 04/11/2022   Counseling and coordination of care 04/11/2022   COPD with acute exacerbation 04/10/2022   Syncope 09/28/2021   Protein-calorie malnutrition, severe 09/27/2021   Hyponatremia    Hypotension due to hypovolemia    Syncope and collapse 09/26/2021   CAP (community acquired pneumonia)    COPD exacerbation 08/27/2021   Asthma, chronic 12/31/2012   Smoker 12/31/2012   Knee pain, bilateral 12/31/2012   Iron deficiency anemia 12/31/2012   Knee pain, chronic 12/31/2012   Home Medication(s) Prior to Admission  medications   Medication Sig Start Date End Date Taking? Authorizing Provider  acetaminophen (TYLENOL) 500 MG tablet Take 1,000 mg by mouth every 6 (six) hours as needed for mild pain.    [provider]  albuterol (PROVENTIL) (2.5 MG/3ML) 0.083% nebulizer solution Take 2.5 mg by nebulization every 8 (eight) hours as needed for wheezing or shortness of breath.    [provider]  albuterol (VENTOLIN HFA) 108 (90 Base) MCG/ACT inhaler Inhale 1-2 puffs into the lungs every 6 (six) hours as needed for wheezing. 01/27/22   Amin, Loura Halt, MD  albuterol (VENTOLIN HFA) 108 (90 Base) MCG/ACT inhaler Inhale 1-2 puffs into the lungs every 6 (six) hours as needed for wheezing or shortness of breath. 10/07/22   Sloan Leiter, DO  Aspirin-Salicylamide-Caffeine (BC FAST PAIN RELIEF) [phone] MG PACK Take 1 Package by mouth daily as needed (pain).    [provider]  atorvastatin (LIPITOR) 20 MG tablet Take 1 tablet (20 mg total) by mouth daily. 01/27/22   Amin, Loura Halt, MD  azithromycin (ZITHROMAX) 250 MG tablet Take 1 tablet (250 mg total) by mouth daily. Take first 2 tablets together, then 1 every day until finished. 10/07/22   Sloan Leiter, DO  Fluticasone-Umeclidin-Vilant (TRELEGY ELLIPTA) 200-62.5-25 MCG/ACT AEPB Inhale 1 Inhalation into the lungs daily. 08/20/22   Adron Bene, MD  guaiFENesin-dextromethorphan (ROBITUSSIN DM) 100-10 MG/5ML syrup Take 5 mLs by mouth every 4 (four) hours as needed for cough. 10/07/22   Sloan Leiter, DO  predniSONE (STERAPRED Carlus Pavlov  21 TAB) 10 MG (21) TBPK tablet Take by mouth daily. Take 6 tabs by mouth daily  for 2 days, then 5 tabs for 2 days, then 4 tabs for 2 days, then 3 tabs for 2 days, 2 tabs for 2 days, then 1 tab by mouth daily for 2 days 09/28/22   Gailen Shelter, PA  sertraline (ZOLOFT) 50 MG tablet Take 1 tablet (50 mg total) by mouth daily. 01/10/22   Garlon Hatchet, PA-C  traZODone (DESYREL) 50 MG tablet Take 1 tablet  (50 mg total) by mouth at bedtime as needed for sleep. 04/19/22   Dorcas Carrow, MD  albuterol (PROVENTIL,VENTOLIN) 90 MCG/ACT inhaler Inhale 2 puffs into the lungs every 6 (six) hours as needed. Shortness of breath and wheezing   03/22/12  [provider]                                                                                                                                    Past Surgical History No past surgical history on file. Family History Family History  Problem Relation Age of Onset   Cancer Mother    Cancer Sister     Social History Social History   Tobacco Use   Smoking status: Every Day    Packs/day: 1    Types: Cigarettes  Substance Use Topics   Alcohol use: Yes    Alcohol/week: 28.0 standard drinks of alcohol    Types: 28 Cans of beer per week   Drug use: No   Allergies Other  Review of Systems Review of Systems  Constitutional:  Negative for chills.  HENT:  Positive for congestion.   Respiratory:  Positive for cough, chest tightness and shortness of breath.   Cardiovascular:  Negative for chest pain.  Skin:  Negative for pallor.    Physical Exam Vital Signs  I have reviewed the triage vital signs BP 121/80   Pulse (!) 102   Temp 98.3 F (36.8 C) (Oral)   Resp 19   SpO2 98%  Physical Exam Vitals and nursing note reviewed.  Constitutional:      General: He is not in acute distress.    Appearance: He is well-developed.  HENT:     Head: Normocephalic and atraumatic.     Right Ear: External ear normal.     Left Ear: External ear normal.     Mouth/Throat:     Mouth: Mucous membranes are moist.  Eyes:     General: No scleral icterus. Cardiovascular:     Rate and Rhythm: Normal rate and regular rhythm.     Pulses: Normal pulses.     Heart sounds: Normal heart sounds.  Pulmonary:     Effort: Pulmonary effort is normal. Tachypnea present. No respiratory distress.     Breath sounds: Wheezing present.  Abdominal:     General:  Abdomen is flat.     Palpations: Abdomen is soft.  Tenderness: There is no abdominal tenderness.  Musculoskeletal:     Right lower leg: No edema.     Left lower leg: No edema.  Skin:    General: Skin is warm and dry.     Capillary Refill: Capillary refill takes less than 2 seconds.  Neurological:     Mental Status: He is alert and oriented to person, place, and time.  Psychiatric:        Mood and Affect: Mood normal.        Behavior: Behavior normal.     ED Results and Treatments Labs (all labs ordered are listed, but only abnormal results are displayed) Labs Reviewed  BASIC METABOLIC PANEL - Abnormal; Notable for the following components:      Result Value   Sodium 133 (*)    All other components within normal limits  CBC WITH DIFFERENTIAL/PLATELET - Abnormal; Notable for the following components:   RBC 3.89 (*)    Hemoglobin 12.8 (*)    HCT 38.9 (*)    Lymphs Abs 0.6 (*)    Monocytes Absolute 1.2 (*)    All other components within normal limits  BRAIN NATRIURETIC PEPTIDE                                                                                                                          Radiology DG Chest 2 View  Result Date: 10/08/2022 CLINICAL DATA:  Shortness of breath EXAM: CHEST - 2 VIEW COMPARISON:  10/07/2022 FINDINGS: The heart size and mediastinal contours are within normal limits. Hyperinflated, hyperlucent lungs. New patchy right basilar opacity. Left lung is clear. No pleural effusion or pneumothorax. The visualized skeletal structures are unremarkable. IMPRESSION: 1. New patchy right basilar opacity, suspicious for pneumonia. 2. COPD. Electronically Signed   By: Duanne Guess D.O.   On: 10/08/2022 14:29    Pertinent labs & imaging results that were available during my care of the patient were reviewed by me and considered in my medical decision making (see MDM for details).  Medications Ordered in ED Medications  ipratropium-albuterol (DUONEB)  0.5-2.5 (3) MG/3ML nebulizer solution 3 mL (has no administration in time range)                                                                                                                                     Procedures Procedures  (including critical care time)  Medical Decision Making / ED Course    Medical Decision Making:     This patient presents to the ED for concern of shortness of breath, this involves an extensive number of treatment options, and is a complaint that carries with it a high risk of complications and morbidity.  The differential diagnosis includes COPD exacerbation, fluid overload, pneumonia, others   Co morbidities that complicate the patient evaluation  History of COPD, noncompliance with medications   Additional history obtained:  Additional history obtained from EMS External records from outside source obtained and reviewed including emergency department notes from yesterday   Lab Tests:  I Ordered, and personally interpreted labs.  The pertinent results include: Grossly unremarkable CBC   Imaging Studies ordered:  I ordered imaging studies including chest x-ray I independently visualized and interpreted imaging which showed  1. New patchy right basilar opacity, suspicious for pneumonia.  2. COPD.   I agree with the radiologist interpretation   Cardiac Monitoring: / EKG:  The patient was maintained on a cardiac monitor.  I personally viewed and interpreted the cardiac monitored which showed an underlying rhythm of: Atrial fibrillation   Problem List / ED Course / Critical interventions / Medication management   I ordered medication including DuoNeb for wheezing Reevaluation of the patient after these medicines showed that the patient improved I have reviewed the patients home medicines and have made adjustments as needed   Social Determinants of Health:  Patient has Medicaid is his primary insurance type   Test / Admission -  Considered:  Patient with audible wheezing, likely COPD exacerbation. Patient endorses not taking home medication regimen for the past two weeks, likely contributing. Does appear to have a right lower lobe pneumonia, prescribed Azithromax yesterday. Disposition pending reassessment, BNP results. Patient care transferred to Langston Masker, PA-C at shift handoff.   Final Clinical Impression(s) / ED Diagnoses Final diagnoses:  COPD exacerbation  Community acquired pneumonia of right lower lobe of lung     This chart was dictated using voice recognition software.  Despite best efforts to proofread,  errors can occur which can change the documentation meaning.       Darrick Grinder, PA-C 10/08/22 1457    Sloan Leiter, DO 10/08/22 1551

## 2022-10-09 DIAGNOSIS — B9789 Other viral agents as the cause of diseases classified elsewhere: Secondary | ICD-10-CM | POA: Diagnosis present

## 2022-10-09 DIAGNOSIS — I10 Essential (primary) hypertension: Secondary | ICD-10-CM | POA: Diagnosis present

## 2022-10-09 DIAGNOSIS — Z515 Encounter for palliative care: Secondary | ICD-10-CM | POA: Diagnosis not present

## 2022-10-09 DIAGNOSIS — J440 Chronic obstructive pulmonary disease with acute lower respiratory infection: Secondary | ICD-10-CM | POA: Diagnosis present

## 2022-10-09 DIAGNOSIS — Z66 Do not resuscitate: Secondary | ICD-10-CM | POA: Diagnosis present

## 2022-10-09 DIAGNOSIS — F32A Depression, unspecified: Secondary | ICD-10-CM | POA: Diagnosis present

## 2022-10-09 DIAGNOSIS — J159 Unspecified bacterial pneumonia: Secondary | ICD-10-CM | POA: Diagnosis present

## 2022-10-09 DIAGNOSIS — Z7951 Long term (current) use of inhaled steroids: Secondary | ICD-10-CM | POA: Diagnosis not present

## 2022-10-09 DIAGNOSIS — J9621 Acute and chronic respiratory failure with hypoxia: Secondary | ICD-10-CM | POA: Diagnosis present

## 2022-10-09 DIAGNOSIS — Z9981 Dependence on supplemental oxygen: Secondary | ICD-10-CM | POA: Diagnosis not present

## 2022-10-09 DIAGNOSIS — F419 Anxiety disorder, unspecified: Secondary | ICD-10-CM | POA: Diagnosis present

## 2022-10-09 DIAGNOSIS — R627 Adult failure to thrive: Secondary | ICD-10-CM | POA: Diagnosis present

## 2022-10-09 DIAGNOSIS — J189 Pneumonia, unspecified organism: Secondary | ICD-10-CM | POA: Diagnosis not present

## 2022-10-09 DIAGNOSIS — R54 Age-related physical debility: Secondary | ICD-10-CM | POA: Diagnosis present

## 2022-10-09 DIAGNOSIS — Z7982 Long term (current) use of aspirin: Secondary | ICD-10-CM | POA: Diagnosis not present

## 2022-10-09 DIAGNOSIS — Z79899 Other long term (current) drug therapy: Secondary | ICD-10-CM | POA: Diagnosis not present

## 2022-10-09 DIAGNOSIS — J441 Chronic obstructive pulmonary disease with (acute) exacerbation: Secondary | ICD-10-CM | POA: Diagnosis present

## 2022-10-09 DIAGNOSIS — E43 Unspecified severe protein-calorie malnutrition: Secondary | ICD-10-CM | POA: Diagnosis present

## 2022-10-09 DIAGNOSIS — Z7189 Other specified counseling: Secondary | ICD-10-CM | POA: Diagnosis not present

## 2022-10-09 DIAGNOSIS — D649 Anemia, unspecified: Secondary | ICD-10-CM | POA: Diagnosis present

## 2022-10-09 DIAGNOSIS — F17210 Nicotine dependence, cigarettes, uncomplicated: Secondary | ICD-10-CM | POA: Diagnosis present

## 2022-10-09 DIAGNOSIS — Z681 Body mass index (BMI) 19 or less, adult: Secondary | ICD-10-CM | POA: Diagnosis not present

## 2022-10-09 DIAGNOSIS — Z1152 Encounter for screening for COVID-19: Secondary | ICD-10-CM | POA: Diagnosis not present

## 2022-10-09 DIAGNOSIS — E871 Hypo-osmolality and hyponatremia: Secondary | ICD-10-CM | POA: Diagnosis present

## 2022-10-09 DIAGNOSIS — R64 Cachexia: Secondary | ICD-10-CM | POA: Diagnosis present

## 2022-10-09 LAB — CBC
HCT: 35.8 % — ABNORMAL LOW (ref 39.0–52.0)
Hemoglobin: 12.1 g/dL — ABNORMAL LOW (ref 13.0–17.0)
MCH: 33.2 pg (ref 26.0–34.0)
MCHC: 33.8 g/dL (ref 30.0–36.0)
MCV: 98.1 fL (ref 80.0–100.0)
Platelets: 205 10*3/uL (ref 150–400)
RBC: 3.65 MIL/uL — ABNORMAL LOW (ref 4.22–5.81)
RDW: 15.1 % (ref 11.5–15.5)
WBC: 4 10*3/uL (ref 4.0–10.5)
nRBC: 0 % (ref 0.0–0.2)

## 2022-10-09 LAB — COMPREHENSIVE METABOLIC PANEL
ALT: 18 U/L (ref 0–44)
AST: 26 U/L (ref 15–41)
Albumin: 3.2 g/dL — ABNORMAL LOW (ref 3.5–5.0)
Alkaline Phosphatase: 47 U/L (ref 38–126)
Anion gap: 13 (ref 5–15)
BUN: 15 mg/dL (ref 8–23)
CO2: 24 mmol/L (ref 22–32)
Calcium: 9 mg/dL (ref 8.9–10.3)
Chloride: 96 mmol/L — ABNORMAL LOW (ref 98–111)
Creatinine, Ser: 0.83 mg/dL (ref 0.61–1.24)
GFR, Estimated: >60 mL/min (ref >60)
Glucose, Bld: 166 mg/dL — ABNORMAL HIGH (ref 70–99)
Potassium: 3.6 mmol/L (ref 3.5–5.1)
Sodium: 133 mmol/L — ABNORMAL LOW (ref 135–145)
Total Bilirubin: 0.6 mg/dL (ref 0.3–1.2)
Total Protein: 6.7 g/dL (ref 6.5–8.1)

## 2022-10-09 LAB — MAGNESIUM: Magnesium: 1.6 mg/dL — ABNORMAL LOW (ref 1.7–2.4)

## 2022-10-09 MED ORDER — SERTRALINE HCL 50 MG PO TABS
50.0000 mg | ORAL_TABLET | Freq: Every day | ORAL | Status: DC
  Administered 2022-10-09 – 2022-10-17 (×9): 50 mg via ORAL
  Filled 2022-10-09 (×9): qty 1

## 2022-10-09 MED ORDER — TRAZODONE HCL 50 MG PO TABS
50.0000 mg | ORAL_TABLET | Freq: Every evening | ORAL | Status: DC | PRN
  Administered 2022-10-10 – 2022-10-16 (×5): 50 mg via ORAL
  Filled 2022-10-09 (×6): qty 1

## 2022-10-09 MED ORDER — ALPRAZOLAM 0.25 MG PO TABS
0.2500 mg | ORAL_TABLET | Freq: Three times a day (TID) | ORAL | Status: DC | PRN
  Administered 2022-10-09 – 2022-10-12 (×6): 0.25 mg via ORAL
  Filled 2022-10-09 (×6): qty 1

## 2022-10-09 MED ORDER — GABAPENTIN 100 MG PO CAPS
200.0000 mg | ORAL_CAPSULE | Freq: Every day | ORAL | Status: AC
  Administered 2022-10-09 – 2022-10-10 (×3): 200 mg via ORAL
  Filled 2022-10-09 (×3): qty 2

## 2022-10-09 NOTE — Progress Notes (Signed)
PROGRESS NOTE    Jaime Cline  ZOX:[id number] DOB: 04-19-1961 DOA: 10/08/2022 PCP: Patient, [name]   Brief Narrative:   62 y.o. male with medical history significant of COPD on home oxygen via nasal cannula 2 to 3 L/min, chronic tobacco use, alcohol abuse, hypertension, chronic back pain, malnutrition, recent admission from 08/17/2022-08/20/2022 for COPD exacerbation and pneumonia presented with worsening shortness of breath.  On presentation, he initially needed 10 L oxygen via nasal cannula. Chest x-ray showed possible new patchy right basilar opacity, suspicious for pneumonia. He was started on Rocephin, azithromycin and also IV Solu-Medrol was given.   Assessment & Plan:   Possible community-acquired right basilar bacterial pneumonia COPD exacerbation Acute on chronic respiratory failure with hypoxia Tobacco abuse ongoing Severe malnutrition/failure to thrive -Patient presented with worsening dyspnea and was found to be tachypneic, tachycardic and required 10 L oxygen via nasal cannula initially in the ED.  Normally uses 2 to 3 L oxygen via nasal cannula.  Currently on 2 L oxygen via nasal cannula.  Patient continues to smoke cigarettes. -Chest x-ray showed possible right basilar pneumonia.  Currently on Rocephin and Zithromax.  Continue Solu-Medrol along with nebs and Dulera.  Check procalcitonin in AM.  COVID-19/RSV/influenza testing negative.  Respiratory virus panel positive for rhinovirus. -Counseled regarding tobacco cessation -Nutrition consult.  Palliative care consultation for goals of care discussion: Might qualify for home hospice.  Overall prognosis is very poor.   Hyponatremia -Mild.  Monitor.  Increase oral intake.  Hypomagnesemia -Replace.  Repeat a.m. labs   Normocytic anemia -Questionable cause.  Hemoglobin currently stable.  Monitor intermittently.   Depression -Resume home regimen once medications have been verified.  Apparently patient has not been taking any  medications for the last 2 weeks as he ran out of them.   DVT prophylaxis: Lovenox Code Status: DNR.   Family Communication: None at bedside Disposition Plan: Status is: Observation The patient will require care spanning > 2 midnights and should be moved to inpatient because: Of severity of illness.  Need for IV steroids and antibiotics  Consultants: Palliative care  Procedures: None  Antimicrobials: Rocephin and Zithromax from 10/08/2022 onwards   Subjective: Patient seen and examined at bedside.  Still complains of shortness of breath with exertion but slightly better.  Does not feel ready to go home.  No fever, vomiting, chest pain reported.  Objective: Vitals:   10/09/22 0600 10/09/22 0751 10/09/22 0753 10/09/22 0807  BP: 116/82   111/78  Pulse: 75   88  Resp: 19   16  Temp:    97.7 F (36.5 C)  TempSrc:    Oral  SpO2: 100% 100% 100% 99%   No intake or output data in the 24 hours ending 10/09/22 0824 There were no vitals filed for this visit.  Examination:  General exam: Appears calm and comfortable.  Looks chronically ill and deconditioned.  Extremely thinly built.  On 2 L oxygen by nasal cannula. Respiratory system: Bilateral decreased breath sounds at bases with some scattered crackles and wheezing Cardiovascular system: S1 & S2 heard, Rate controlled Gastrointestinal system: Abdomen is nondistended, soft and nontender. Normal bowel sounds heard. Extremities: No cyanosis, clubbing, edema  Central nervous system: Alert and oriented.  Slow to respond.  Poor historian no focal neurological deficits. Moving extremities Skin: No petechiae or rashes  psychiatry: Mostly flat affect.  Currently not agitated.     Data Reviewed: I have personally reviewed following labs and imaging studies  CBC: Recent Labs  Lab 10/07/22 0815 10/08/22 1400 10/09/22 0048  WBC 8.3 5.2 4.0  NEUTROABS 6.1 3.4  --   HGB 13.8 12.8* 12.1*  HCT 41.6 38.9* 35.8*  MCV 99.8 100.0 98.1  PLT  180 203 205   Basic Metabolic Panel: Recent Labs  Lab 10/07/22 0815 10/08/22 1400 10/09/22 0048  NA 133* 133* 133*  K 3.3* 3.5 3.6  CL 98 99 96*  CO2 GLUCOSE 107* 95 166*  BUN CREATININE 0.78 0.69 0.83  CALCIUM 8.9 9.1 9.0  MG  --   --  1.6*   GFR: Estimated Creatinine Clearance: 53.9 mL/min (by C-G formula based on SCr of 0.83 mg/dL). Liver Function Tests: Recent Labs  Lab 10/09/22 0048  AST 26  ALT 18  ALKPHOS 47  BILITOT 0.6  PROT 6.7  ALBUMIN 3.2*   No results for input(s): "LIPASE", "AMYLASE" in the last 168 hours. No results for input(s): "AMMONIA" in the last 168 hours. Coagulation Profile: No results for input(s): "INR", "PROTIME" in the last 168 hours. Cardiac Enzymes: No results for input(s): "CKTOTAL", "CKMB", "CKMBINDEX", "TROPONINI" in the last 168 hours. BNP (last 3 results) No results for input(s): "PROBNP" in the last 8760 hours. HbA1C: No results for input(s): "HGBA1C" in the last 72 hours. CBG: No results for input(s): "GLUCAP" in the last 168 hours. Lipid Profile: No results for input(s): "CHOL", "HDL", "LDLCALC", "TRIG", "CHOLHDL", "LDLDIRECT" in the last 72 hours. Thyroid Function Tests: No results for input(s): "TSH", "T4TOTAL", "FREET4", "T3FREE", "THYROIDAB" in the last 72 hours. Anemia Panel: No results for input(s): "VITAMINB12", "FOLATE", "FERRITIN", "TIBC", "IRON", "RETICCTPCT" in the last 72 hours. Sepsis Labs: No results for input(s): "PROCALCITON", "LATICACIDVEN" in the last 168 hours.  Recent Results (from the past 240 hour(s))  SARS Coronavirus 2 by RT PCR (hospital order, performed in [facility] hospital lab) *cepheid single result test* Anterior Nasal Swab     Status: None   Collection Time: 10/07/22  8:15 AM   Specimen: Anterior Nasal Swab  Result Value Ref Range Status   SARS Coronavirus 2 by RT PCR NEGATIVE NEGATIVE Final    Comment: (NOTE) SARS-CoV-2 target nucleic acids are NOT DETECTED.  The  SARS-CoV-2 RNA is generally detectable in upper and lower respiratory specimens during the acute phase of infection. The lowest concentration of SARS-CoV-2 viral copies this assay can detect is 250 copies / mL. A negative result does not preclude SARS-CoV-2 infection and should not be used as the sole basis for treatment or other patient management decisions.  A negative result may occur with improper specimen collection / handling, submission of specimen other than nasopharyngeal swab, presence of viral mutation(s) within the areas targeted by this assay, and inadequate number of viral copies (<250 copies / mL). A negative result must be combined with clinical observations, patient history, and epidemiological information.  Fact Sheet for Patients:   RoadLapTop.co.za  Fact Sheet for Healthcare Providers: http://kim-miller.com/  This test is not yet approved or  cleared by the [place] FDA and has been authorized for detection and/or diagnosis of SARS-CoV-2 by FDA under an Emergency Use Authorization (EUA).  This EUA will remain in effect (meaning this test can be used) for the duration of the COVID-19 declaration under Section 564(b)(1) of the Act, 21 U.S.C. section 360bbb-3(b)(1), unless the authorization is terminated or revoked sooner.  Performed at [facility], [address], [place], [place] [address]   Respiratory (~20 pathogens) panel by PCR  Status: Abnormal   Collection Time: 10/08/22  5:48 PM   Specimen: Nasopharyngeal Swab; Respiratory  Result Value Ref Range Status   Adenovirus NOT DETECTED NOT DETECTED Final   Coronavirus 229E NOT DETECTED NOT DETECTED Final    Comment: (NOTE) The Coronavirus on the Respiratory Panel, DOES NOT test for the novel  Coronavirus (2019 nCoV)    Coronavirus HKU1 NOT DETECTED NOT DETECTED Final   Coronavirus NL63 NOT DETECTED NOT DETECTED Final   Coronavirus OC43  NOT DETECTED NOT DETECTED Final   Metapneumovirus NOT DETECTED NOT DETECTED Final   Rhinovirus / Enterovirus DETECTED (A) NOT DETECTED Final   Influenza A NOT DETECTED NOT DETECTED Final   Influenza B NOT DETECTED NOT DETECTED Final   Parainfluenza Virus 1 NOT DETECTED NOT DETECTED Final   Parainfluenza Virus 2 NOT DETECTED NOT DETECTED Final   Parainfluenza Virus 3 NOT DETECTED NOT DETECTED Final   Parainfluenza Virus 4 NOT DETECTED NOT DETECTED Final   Respiratory Syncytial Virus NOT DETECTED NOT DETECTED Final   Bordetella pertussis NOT DETECTED NOT DETECTED Final   Bordetella Parapertussis NOT DETECTED NOT DETECTED Final   Chlamydophila pneumoniae NOT DETECTED NOT DETECTED Final   Mycoplasma pneumoniae NOT DETECTED NOT DETECTED Final    Comment: Performed at [facility], [address], [place], [place] [address]  Resp panel by RT-PCR (RSV, Flu A&B, Covid) Anterior Nasal Swab     Status: None   Collection Time: 10/08/22  5:48 PM   Specimen: Anterior Nasal Swab  Result Value Ref Range Status   SARS Coronavirus 2 by RT PCR NEGATIVE NEGATIVE Final   Influenza A by PCR NEGATIVE NEGATIVE Final   Influenza B by PCR NEGATIVE NEGATIVE Final    Comment: (NOTE) The Xpert Xpress SARS-CoV-2/FLU/RSV plus assay is intended as an aid in the diagnosis of influenza from Nasopharyngeal swab specimens and should not be used as a sole basis for treatment. Nasal washings and aspirates are unacceptable for Xpert Xpress SARS-CoV-2/FLU/RSV testing.  Fact Sheet for Patients: BloggerCourse.com  Fact Sheet for Healthcare Providers: SeriousBroker.it  This test is not yet approved or cleared by the [place] FDA and has been authorized for detection and/or diagnosis of SARS-CoV-2 by FDA under an Emergency Use Authorization (EUA). This EUA will remain in effect (meaning this test can be used) for the duration of the COVID-19 declaration under  Section 564(b)(1) of the Act, 21 U.S.C. section 360bbb-3(b)(1), unless the authorization is terminated or revoked.     Resp Syncytial Virus by PCR NEGATIVE NEGATIVE Final    Comment: (NOTE) Fact Sheet for Patients: BloggerCourse.com  Fact Sheet for Healthcare Providers: SeriousBroker.it  This test is not yet approved or cleared by the [place] FDA and has been authorized for detection and/or diagnosis of SARS-CoV-2 by FDA under an Emergency Use Authorization (EUA). This EUA will remain in effect (meaning this test can be used) for the duration of the COVID-19 declaration under Section 564(b)(1) of the Act, 21 U.S.C. section 360bbb-3(b)(1), unless the authorization is terminated or revoked.  Performed at [facility], [address], [place], [place] [address]          Radiology Studies: DG Chest 2 View  Result Date: 10/08/2022 CLINICAL DATA:  Shortness of breath EXAM: CHEST - 2 VIEW COMPARISON:  10/07/2022 FINDINGS: The heart size and mediastinal contours are within normal limits. Hyperinflated, hyperlucent lungs. New patchy right basilar opacity. Left lung is clear. No pleural effusion or pneumothorax. The visualized skeletal structures  are unremarkable. IMPRESSION: 1. New patchy right basilar opacity, suspicious for pneumonia. 2. COPD. Electronically Signed   By: Duanne Guess D.O.   On: 10/08/2022 14:29   DG Chest Port 1 View  Result Date: 10/07/2022 CLINICAL DATA:  coughj EXAM: PORTABLE CHEST - 1 VIEW COMPARISON:  09/27/2022 FINDINGS: Pulmonary hyperinflation with attenuated bronchovascular markings as before. No focal infiltrate or overt edema. Heart size and mediastinal contours are within normal limits. Aortic Atherosclerosis (ICD10-170.0). No effusion. Visualized bones unremarkable. IMPRESSION: Pulmonary hyperinflation without acute findings. Electronically Signed   By: Corlis Leak M.D.   On: 10/07/2022 08:50         Scheduled Meds:  enoxaparin (LOVENOX) injection  30 mg Subcutaneous Q24H   gabapentin  200 mg Oral QHS   guaiFENesin  1,200 mg Oral BID   ipratropium-albuterol  3 mL Nebulization Q6H   methylPREDNISolone (SOLU-MEDROL) injection  80 mg Intravenous Q12H   mometasone-formoterol  2 puff Inhalation BID   Continuous Infusions:  azithromycin     cefTRIAXone (ROCEPHIN)  IV            Glade Lloyd, MD [facility] 10/09/2022, 8:24 AM

## 2022-10-09 NOTE — ED Notes (Signed)
ED TO INPATIENT HANDOFF REPORT  ED Nurse Name and Phone # Lew Dawes RN [phone]  S Name/Age/Gender Jaime Cline 62 y.o. male Room/Bed: 009C/009C  Code Status   Code Status: DNR  Home/SNF/Other Home Patient oriented to: self, place, time, and situation Is this baseline? Yes   Triage Complete: Triage complete  Chief Complaint COPD exacerbation [J44.1]  Triage Note Patient BIB EMS from home with SOB x1wk, out of breathing tx medication x1wk. H/O CHF/COPD. Patient wears home O2 2-3liters at home. 154/115, 110, 98% with neb tx. CBG 143.    Allergies Allergies  Allergen Reactions   Other Other (See Comments)    BP med from [facility] about 3 years ago, caused some seizures, due to getting wrong BP med at that time.  Unknown name of med    Level of Care/Admitting Diagnosis ED Disposition     ED Disposition  Admit   Condition  --   Comment  Hospital Area: [facility] [[id number]]  Level of Care: Telemetry Medical [104]  May place patient in observation at [facility] or [facility] if equivalent level of care is available:: Yes  Covid Evaluation: Asymptomatic - no recent exposure (last 10 days) testing not required  Diagnosis: COPD exacerbation [331795]  Admitting Physician: Glade Lloyd [[id number]]  Attending Physician: Glade Lloyd [[id number]]          B Medical/Surgery History Past Medical History:  Diagnosis Date   Alcohol abuse    Asthma    Bronchitis    Chronic back pain    Chronic knee pain    Hypertension    No past surgical history on file.   A IV Location/Drains/Wounds Patient Lines/Drains/Airways Status     Active Line/Drains/Airways     Name Placement date Placement time Site Days   Peripheral IV 10/08/22 22 G Anterior;Left Forearm 10/08/22  1407  Forearm  1            Intake/Output Last 24 hours No intake or output data in the 24 hours ending 10/09/22 0936  Labs/Imaging Results for orders placed or performed during  the hospital encounter of 10/08/22 (from the past 48 hour(s))  Basic metabolic panel     Status: Abnormal   Collection Time: 10/08/22  2:00 PM  Result Value Ref Range   Sodium 133 (L) 135 - 145 mmol/L   Potassium 3.5 3.5 - 5.1 mmol/L   Chloride 99 98 - 111 mmol/L   CO2 22 22 - 32 mmol/L   Glucose, Bld 95 70 - 99 mg/dL    Comment: Glucose reference range applies only to samples taken after fasting for at least 8 hours.   BUN 13 8 - 23 mg/dL   Creatinine, Ser 5.28 0.61 - 1.24 mg/dL   Calcium 9.1 8.9 - 41.3 mg/dL   GFR, Estimated >24 >40 mL/min    Comment: (NOTE) Calculated using the CKD-EPI Creatinine Equation (2021)    Anion gap 12 5 - 15    Comment: Performed at [facility], [address] [place], [place] [address]  CBC with Differential     Status: Abnormal   Collection Time: 10/08/22  2:00 PM  Result Value Ref Range   WBC 5.2 4.0 - 10.5 K/uL   RBC 3.89 (L) 4.22 - 5.81 MIL/uL   Hemoglobin 12.8 (L) 13.0 - 17.0 g/dL   HCT 53.6 (L) 64.4 - 03.4 %   MCV 100.0 80.0 - 100.0 fL   MCH 32.9 26.0 - 34.0 pg  MCHC 32.9 30.0 - 36.0 g/dL   RDW 16.1 09.6 - 04.5 %   Platelets 203 150 - 400 K/uL   nRBC 0.0 0.0 - 0.2 %   Neutrophils Relative % 65 %   Neutro Abs 3.4 1.7 - 7.7 K/uL   Lymphocytes Relative 11 %   Lymphs Abs 0.6 (L) 0.7 - 4.0 K/uL   Monocytes Relative 24 %   Monocytes Absolute 1.2 (H) 0.1 - 1.0 K/uL   Eosinophils Relative 0 %   Eosinophils Absolute 0.0 0.0 - 0.5 K/uL   Basophils Relative 0 %   Basophils Absolute 0.0 0.0 - 0.1 K/uL   Immature Granulocytes 0 %   Abs Immature Granulocytes 0.01 0.00 - 0.07 K/uL    Comment: Performed at [facility], [address] [place], [place] [address]  Brain natriuretic peptide     Status: Abnormal   Collection Time: 10/08/22  2:00 PM  Result Value Ref Range   B Natriuretic Peptide 109.6 (H) 0.0 - 100.0 pg/mL    Comment: Performed at [facility], [address] [place], [place] [address]  Respiratory (~20  pathogens) panel by PCR     Status: Abnormal   Collection Time: 10/08/22  5:48 PM   Specimen: Nasopharyngeal Swab; Respiratory  Result Value Ref Range   Adenovirus NOT DETECTED NOT DETECTED   Coronavirus 229E NOT DETECTED NOT DETECTED    Comment: (NOTE) The Coronavirus on the Respiratory Panel, DOES NOT test for the novel  Coronavirus (2019 nCoV)    Coronavirus HKU1 NOT DETECTED NOT DETECTED   Coronavirus NL63 NOT DETECTED NOT DETECTED   Coronavirus OC43 NOT DETECTED NOT DETECTED   Metapneumovirus NOT DETECTED NOT DETECTED   Rhinovirus / Enterovirus DETECTED (A) NOT DETECTED   Influenza A NOT DETECTED NOT DETECTED   Influenza B NOT DETECTED NOT DETECTED   Parainfluenza Virus 1 NOT DETECTED NOT DETECTED   Parainfluenza Virus 2 NOT DETECTED NOT DETECTED   Parainfluenza Virus 3 NOT DETECTED NOT DETECTED   Parainfluenza Virus 4 NOT DETECTED NOT DETECTED   Respiratory Syncytial Virus NOT DETECTED NOT DETECTED   Bordetella pertussis NOT DETECTED NOT DETECTED   Bordetella Parapertussis NOT DETECTED NOT DETECTED   Chlamydophila pneumoniae NOT DETECTED NOT DETECTED   Mycoplasma pneumoniae NOT DETECTED NOT DETECTED    Comment: Performed at [facility], [address], [place], [place] [address]  Resp panel by RT-PCR (RSV, Flu A&B, Covid) Anterior Nasal Swab     Status: None   Collection Time: 10/08/22  5:48 PM   Specimen: Anterior Nasal Swab  Result Value Ref Range   SARS Coronavirus 2 by RT PCR NEGATIVE NEGATIVE   Influenza A by PCR NEGATIVE NEGATIVE   Influenza B by PCR NEGATIVE NEGATIVE    Comment: (NOTE) The Xpert Xpress SARS-CoV-2/FLU/RSV plus assay is intended as an aid in the diagnosis of influenza from Nasopharyngeal swab specimens and should not be used as a sole basis for treatment. Nasal washings and aspirates are unacceptable for Xpert Xpress SARS-CoV-2/FLU/RSV testing.  Fact Sheet for Patients: BloggerCourse.com  Fact Sheet for Healthcare  Providers: SeriousBroker.it  This test is not yet approved or cleared by the [place] FDA and has been authorized for detection and/or diagnosis of SARS-CoV-2 by FDA under an Emergency Use Authorization (EUA). This EUA will remain in effect (meaning this test can be used) for the duration of the COVID-19 declaration under Section 564(b)(1) of the Act, 21 U.S.C. section 360bbb-3(b)(1), unless the authorization  is terminated or revoked.     Resp Syncytial Virus by PCR NEGATIVE NEGATIVE    Comment: (NOTE) Fact Sheet for Patients: BloggerCourse.com  Fact Sheet for Healthcare Providers: SeriousBroker.it  This test is not yet approved or cleared by the [place] FDA and has been authorized for detection and/or diagnosis of SARS-CoV-2 by FDA under an Emergency Use Authorization (EUA). This EUA will remain in effect (meaning this test can be used) for the duration of the COVID-19 declaration under Section 564(b)(1) of the Act, 21 U.S.C. section 360bbb-3(b)(1), unless the authorization is terminated or revoked.  Performed at [facility], [address], [place], [place] [address]   Strep pneumoniae urinary antigen     Status: None   Collection Time: 10/08/22 10:37 PM  Result Value Ref Range   Strep Pneumo Urinary Antigen NEGATIVE NEGATIVE    Comment:        Infection due to S. pneumoniae cannot be absolutely ruled out since the antigen present may be below the detection limit of the test. Performed at [facility], [address], [place], [place] [address]   CBC     Status: Abnormal   Collection Time: 10/09/22 12:48 AM  Result Value Ref Range   WBC 4.0 4.0 - 10.5 K/uL   RBC 3.65 (L) 4.22 - 5.81 MIL/uL   Hemoglobin 12.1 (L) 13.0 - 17.0 g/dL   HCT 09.8 (L) 11.9 - 14.7 %   MCV 98.1 80.0 - 100.0 fL   MCH 33.2 26.0 - 34.0 pg   MCHC 33.8 30.0 - 36.0 g/dL   RDW 82.9 56.2 - 13.0 %    Platelets 205 150 - 400 K/uL   nRBC 0.0 0.0 - 0.2 %    Comment: Performed at [facility], [address], [place], [place] [address]  Comprehensive metabolic panel     Status: Abnormal   Collection Time: 10/09/22 12:48 AM  Result Value Ref Range   Sodium 133 (L) 135 - 145 mmol/L   Potassium 3.6 3.5 - 5.1 mmol/L   Chloride 96 (L) 98 - 111 mmol/L   CO2 24 22 - 32 mmol/L   Glucose, Bld 166 (H) 70 - 99 mg/dL    Comment: Glucose reference range applies only to samples taken after fasting for at least 8 hours.   BUN 15 8 - 23 mg/dL   Creatinine, Ser 4.69 0.61 - 1.24 mg/dL   Calcium 9.0 8.9 - 62.9 mg/dL   Total Protein 6.7 6.5 - 8.1 g/dL   Albumin 3.2 (L) 3.5 - 5.0 g/dL   AST 26 15 - 41 U/L   ALT 18 0 - 44 U/L   Alkaline Phosphatase 47 38 - 126 U/L   Total Bilirubin 0.6 0.3 - 1.2 mg/dL   GFR, Estimated >52 >84 mL/min    Comment: (NOTE) Calculated using the CKD-EPI Creatinine Equation (2021)    Anion gap 13 5 - 15    Comment: Performed at [facility], [address] [place], [place] [address]  Magnesium     Status: Abnormal   Collection Time: 10/09/22 12:48 AM  Result Value Ref Range   Magnesium 1.6 (L) 1.7 - 2.4 mg/dL    Comment: Performed at [facility], [address], [place], [place] [address]   DG Chest 2 View  Result Date: 10/08/2022 CLINICAL DATA:  Shortness of breath EXAM: CHEST - 2 VIEW COMPARISON:  10/07/2022 FINDINGS: The heart size and mediastinal contours are  within normal limits. Hyperinflated, hyperlucent lungs. New patchy right basilar opacity. Left lung is clear. No pleural effusion or pneumothorax. The visualized skeletal structures are unremarkable. IMPRESSION: 1. New patchy right basilar opacity, suspicious for pneumonia. 2. COPD. Electronically Signed   By: Duanne Guess D.O.   On: 10/08/2022 14:29    Pending Labs Unresulted Labs (From admission, onward)     Start     Ordered   10/15/22 0500  Creatinine, serum  (enoxaparin (LOVENOX)     CrCl >/= 30 ml/min)  Weekly,   R     Comments: while on enoxaparin therapy    10/08/22 1718   10/08/22 1713  Legionella Pneumophila Serogp 1 Ur Ag  (COPD / Pneumonia / Cellulitis / Lower Extremity Wound)  Once,   R        10/08/22 1718            Vitals/Pain Today's Vitals   10/09/22 0600 10/09/22 0751 10/09/22 0753 10/09/22 0807  BP: 116/82   111/78  Pulse: 75   88  Resp: 19   16  Temp:    97.7 F (36.5 C)  TempSrc:    Oral  SpO2: 100% 100% 100% 99%  PainSc:    0-No pain    Isolation Precautions Droplet precaution  Medications Medications  enoxaparin (LOVENOX) injection 30 mg (30 mg Subcutaneous Given 10/08/22 1814)  cefTRIAXone (ROCEPHIN) 2 g in sodium chloride 0.9 % 100 mL IVPB (has no administration in time range)  azithromycin (ZITHROMAX) 500 mg in sodium chloride 0.9 % 250 mL IVPB (has no administration in time range)  methylPREDNISolone sodium succinate (SOLU-MEDROL) 125 mg/2 mL injection 80 mg (80 mg Intravenous Given 10/09/22 0613)  mometasone-formoterol (DULERA) 200-5 MCG/ACT inhaler 2 puff (2 puffs Inhalation Given 10/09/22 0753)  ipratropium-albuterol (DUONEB) 0.5-2.5 (3) MG/3ML nebulizer solution 3 mL (3 mLs Nebulization Given 10/09/22 0751)  albuterol (PROVENTIL) (2.5 MG/3ML) 0.083% nebulizer solution 2.5 mg (has no administration in time range)  acetaminophen (TYLENOL) tablet 650 mg (has no administration in time range)    Or  acetaminophen (TYLENOL) suppository 650 mg (has no administration in time range)  ondansetron (ZOFRAN) tablet 4 mg (has no administration in time range)    Or  ondansetron (ZOFRAN) injection 4 mg (has no administration in time range)  guaiFENesin (MUCINEX) 12 hr tablet 1,200 mg (1,200 mg Oral Given 10/08/22 2226)  gabapentin (NEURONTIN) capsule 200 mg (200 mg Oral Given 10/09/22 0322)  ipratropium-albuterol (DUONEB) 0.5-2.5 (3) MG/3ML nebulizer solution 3 mL (3 mLs Nebulization Given 10/08/22 1524)  cefTRIAXone (ROCEPHIN) 1 g in sodium  chloride 0.9 % 100 mL IVPB (0 g Intravenous Stopped 10/08/22 1749)  azithromycin (ZITHROMAX) tablet 250 mg (250 mg Oral Given 10/08/22 1719)  methylPREDNISolone sodium succinate (SOLU-MEDROL) 125 mg/2 mL injection 125 mg (125 mg Intravenous Given 10/08/22 1719)    Mobility walks with device     Focused Assessments Pulmonary Assessment Handoff:  Lung sounds: Bilateral Breath Sounds: Expiratory wheezes O2 Device: Nasal Cannula O2 Flow Rate (L/min): 2 L/min    R Recommendations: See Admitting Provider Note  Report given to:   Additional Notes: Patient is alert oriented.

## 2022-10-09 NOTE — Evaluation (Signed)
Clinical/Bedside Swallow Evaluation Patient Details  Name: Jaime Cline MRN: [id number] Date of Birth: 1960-08-23  Today's Date: 10/09/2022 Time: SLP Start Time (ACUTE ONLY): 1355 SLP Stop Time (ACUTE ONLY): 1406 SLP Time Calculation (min) (ACUTE ONLY): 11 min  Past Medical History:  Past Medical History:  Diagnosis Date   Alcohol abuse    Asthma    Bronchitis    Chronic back pain    Chronic knee pain    Hypertension    Past Surgical History: No past surgical history on file. HPI:  62 y.o. male presented with worsening SOB.  Chest x-ray showed possible new patchy right basilar opacity, suspicious for pneumonia.  Prior medical hx  significant for COPD on home oxygen, chronic tobacco use, alcohol abuse, hypertension, chronic back pain, malnutrition, recent admission from 08/17/2022 -08/20/2022 for COPD exacerbation and pneumonia. MBS on 08/18/22: normal oral and pharyngeal swallow. Concerns for potential esophageal dysphagia.  Regular solids/thin liquids recommended. No SLP f/u recommended.    Assessment / Plan / Recommendation  Clinical Impression  Pt participated in clinical swallowing assessment. He confessed to some anxiety and ongoing SOB. Nasal cannula was under his chin; put it back in place. Sp02 was in upper 90s for duration of session; RR <20.  He presented with thorough mastication, the appearance of a brisk swallow response, no s/s of aspiration.  Clinical presentation was similar to findings described during last admission. Doubt an oropharyngeal dysphagia is contributing to current situation. There was a question during last admission of an esophageal dysphagia with potential for post-prandial aspiration. Esophagram from April 2023 found "disruption of primary peristaltic waves on almost all swallows,  with to and fro motion of barium in the esophagus, compatible with  nonspecific esophageal dysmotility disorder."  Recommend that he resume regular solids/thin liquids, taking his time  during meals; should remain upright 45 minutes after eating and avoid PO intake two hours before bedtime. No further SLP f/u needed. D/W RN. SLP Visit Diagnosis: Dysphagia, unspecified (R13.10)           Diet Recommendation   Regular solids, thin liquids  Medication Administration: Whole meds with liquid    Other  Recommendations Recommended Consults: Consider GI evaluation Oral Care Recommendations: Oral care BID    Recommendations for follow up therapy are one component of a multi-disciplinary discharge planning process, led by the attending physician.  Recommendations may be updated based on patient status, additional functional criteria and insurance authorization.  Follow up Recommendations No SLP follow up        Swallow Study   General Date of Onset: 10/08/22 HPI: 62 y.o. male presented with worsening SOB.  Chest x-ray showed possible new patchy right basilar opacity, suspicious for pneumonia.  Prior medical hx  significant for COPD on home oxygen, chronic tobacco use, alcohol abuse, hypertension, chronic back pain, malnutrition, recent admission from 08/17/2022 -08/20/2022 for COPD exacerbation and pneumonia. MBS on 08/18/22: normal oral and pharyngeal swallow. Concerns for potential esophageal dysphagia.  Regular solids/thin liquids recommended. No SLP f/u recommended. Type of Study: Bedside Swallow Evaluation Previous Swallow Assessment: see HPI Diet Prior to this Study: Regular;Thin liquids (Level 0) Temperature Spikes Noted: No Respiratory Status: Nasal cannula History of Recent Intubation: No Behavior/Cognition: Alert Oral Cavity Assessment: Within Functional Limits Oral Care Completed by SLP: No Vision: Functional for self-feeding Self-Feeding Abilities: Able to feed self Patient Positioning: Upright in bed Baseline Vocal Quality: Normal Volitional Cough: Strong Volitional Swallow: Able to elicit    Oral/Motor/Sensory Function Overall Oral  Motor/Sensory Function:  Within functional limits   Ice Chips Ice chips: Within functional limits   Thin Liquid Thin Liquid: Within functional limits    Nectar Thick Nectar Thick Liquid: Not tested   Honey Thick Honey Thick Liquid: Not tested   Puree Puree: Within functional limits   Solid     Solid: Within functional limits      Blenda Mounts Laurice 10/09/2022,2:19 PM  Marchelle Folks L. Samson Frederic, MA CCC/SLP Clinical Specialist - Acute Care SLP Acute Rehabilitation Services Office number [phone]

## 2022-10-09 NOTE — ED Notes (Signed)
Floor called to let them know patient was coming

## 2022-10-10 DIAGNOSIS — J189 Pneumonia, unspecified organism: Secondary | ICD-10-CM

## 2022-10-10 DIAGNOSIS — Z7189 Other specified counseling: Secondary | ICD-10-CM | POA: Diagnosis not present

## 2022-10-10 DIAGNOSIS — J441 Chronic obstructive pulmonary disease with (acute) exacerbation: Secondary | ICD-10-CM | POA: Diagnosis not present

## 2022-10-10 LAB — LEGIONELLA PNEUMOPHILA SEROGP 1 UR AG: L. pneumophila Serogp 1 Ur Ag: NEGATIVE

## 2022-10-10 LAB — PROCALCITONIN: Procalcitonin: 0.12 ng/mL

## 2022-10-10 MED ORDER — METHYLPREDNISOLONE SODIUM SUCC 125 MG IJ SOLR
80.0000 mg | INTRAMUSCULAR | Status: DC
  Administered 2022-10-11 – 2022-10-14 (×4): 80 mg via INTRAVENOUS
  Filled 2022-10-10 (×5): qty 2

## 2022-10-10 MED ORDER — ADULT MULTIVITAMIN W/MINERALS CH
1.0000 | ORAL_TABLET | Freq: Every day | ORAL | Status: DC
  Administered 2022-10-10 – 2022-10-12 (×3): 1 via ORAL
  Filled 2022-10-10 (×3): qty 1

## 2022-10-10 MED ORDER — ENSURE ENLIVE PO LIQD
237.0000 mL | Freq: Three times a day (TID) | ORAL | Status: DC
  Administered 2022-10-10 – 2022-10-12 (×5): 237 mL via ORAL

## 2022-10-10 NOTE — Evaluation (Signed)
Physical Therapy Evaluation Patient Details Name: Jaime Cline MRN: [id number] DOB: 1960/11/30 Today's Date: 10/10/2022  History of Present Illness  Pt is a 62 y/o M admitted on 10/08/22 after presenting with c/o worsening SOB. Pt initially required 10L O2 & chest x-ray showed possible new patchy R basilar opacity, suspicious for PNA. Pt positive for rhinovirus/enterovirus. PMH: COPD on 2-3L home O2, chronic tobacco use, alcohol abuse, HTN, chronic back pain, malnutrition, recent admission 08/17/22-08/20/22 for COPD exacerbation & PNA  Clinical Impression  Pt seen for PT evaluation with pt agreeable to tx. Pt reports he lives in an apartment with 7 steps with L rail to enter with his sister (home all day) & son (works during the day). Pt notes he has a rollator but it doesn't fit in all areas of his apartment & he endorses many falls over the past 6 months. On this date, pt requires min assist to complete stand pivot bed>recliner with RW 2/2 1 mild LOB with min assist to correct. Pt is able to ambulate increased distances with RW & CGA with standing rest breaks PRN 2/2 SOB. PT educates pt on recommendation to use RW for mobility at this time. Continue to recommend ongoing PT services to address balance, strengthening, & gait with LRAD.    Recommendations for follow up therapy are one component of a multi-disciplinary discharge planning process, led by the attending physician.  Recommendations may be updated based on patient status, additional functional criteria and insurance authorization.  Follow Up Recommendations       Assistance Recommended at Discharge Intermittent Supervision/Assistance  Patient can return home with the following  A little help with walking and/or transfers;Assistance with cooking/housework;A little help with bathing/dressing/bathroom;Help with stairs or ramp for entrance;Assist for transportation    Equipment Recommendations Rolling walker (2 wheels)  Recommendations for  Other Services       Functional Status Assessment Patient has had a recent decline in their functional status and demonstrates the ability to make significant improvements in function in a reasonable and predictable amount of time.     Precautions / Restrictions Precautions Precautions: Fall Restrictions Weight Bearing Restrictions: No      Mobility  Bed Mobility Overal bed mobility: Modified Independent Bed Mobility: Supine to Sit     Supine to sit: Modified independent (Device/Increase time), HOB elevated          Transfers Overall transfer level: Needs assistance Equipment used: Rolling walker (2 wheels) Transfers: Sit to/from Stand, Bed to chair/wheelchair/BSC Sit to Stand: Min guard   Step pivot transfers: Min assist (1 LOB 2/2 scissoring stepping with min assist to correct, use of RW)       General transfer comment: education/cuing re: hand placement during STS, transfers from low EOB.    Ambulation/Gait Ambulation/Gait assistance: Min guard Gait Distance (Feet): 110 Feet Assistive device: Rolling walker (2 wheels) Gait Pattern/deviations: Step-through pattern, Decreased stride length, Trunk flexed Gait velocity: decreased     General Gait Details: Pt pushes RW slightly out in front of him with slight forward flexed postured.  Stairs            Wheelchair Mobility    Modified Rankin (Stroke Patients Only)       Balance Overall balance assessment: Needs assistance, History of Falls Sitting-balance support: Feet supported Sitting balance-Leahy Scale: Fair     Standing balance support: During functional activity, Bilateral upper extremity supported, Reliant on assistive device for balance Standing balance-Leahy Scale: Poor  Pertinent Vitals/Pain Pain Assessment Pain Assessment: No/denies pain    Home Living Family/patient expects to be discharged to:: Private residence Living Arrangements: Other  relatives Available Help at Discharge: Family;Available 24 hours/day Type of Home: Apartment Home Access: Stairs to enter Entrance Stairs-Rails: Left Entrance Stairs-Number of Steps: 7   Home Layout: One level Home Equipment: Rolling Walker (2 wheels);Grab bars - tub/shower;Other (comment) Additional Comments: Lives with sister (home all day) & son (works during the day).    Prior Function Prior Level of Function : Needs assist             Mobility Comments: Pt reports he has a rollator but doesn't always use it (notes it won't fit in some hallways). Pt endorses many falls in the past 6 months.       Hand Dominance        Extremity/Trunk Assessment   Upper Extremity Assessment Upper Extremity Assessment: Generalized weakness    Lower Extremity Assessment Lower Extremity Assessment: Generalized weakness       Communication   Communication: No difficulties  Cognition Arousal/Alertness: Awake/alert Behavior During Therapy: WFL for tasks assessed/performed Overall Cognitive Status: Within Functional Limits for tasks assessed                                          General Comments General comments (skin integrity, edema, etc.): Pt on 4L/min via nasal cannula throughout session with SPO2 >90% but pt endorsing SOB at times with PT educating him on pursed lip breathing. Pt alternates wearing nasal cannula in nose vs mouth with PT educating him to wear it in nose. Pt notes he's on 4-5L/min O2 at home (chart states 2-3L/min).    Exercises     Assessment/Plan    PT Assessment Patient needs continued PT services  PT Problem List Decreased strength;Cardiopulmonary status limiting activity;Decreased activity tolerance;Decreased balance;Decreased mobility;Decreased knowledge of use of DME;Decreased safety awareness       PT Treatment Interventions DME instruction;Therapeutic exercise;Gait training;Stair training;Neuromuscular re-education;Balance  training;Functional mobility training;Therapeutic activities;Patient/family education    PT Goals (Current goals can be found in the Care Plan section)  Acute Rehab PT Goals Patient Stated Goal: get stronger, get better PT Goal Formulation: With patient Time For Goal Achievement: 10/24/22 Potential to Achieve Goals: Good    Frequency Min 3X/week     Co-evaluation               AM-PAC PT "6 Clicks" Mobility  Outcome Measure Help needed turning from your back to your side while in a flat bed without using bedrails?: None Help needed moving from lying on your back to sitting on the side of a flat bed without using bedrails?: A Little Help needed moving to and from a bed to a chair (including a wheelchair)?: A Little Help needed standing up from a chair using your arms (e.g., wheelchair or bedside chair)?: A Little Help needed to walk in hospital room?: A Little Help needed climbing 3-5 steps with a railing? : A Little 6 Click Score: 19    End of Session Equipment Utilized During Treatment: Oxygen Activity Tolerance: Patient tolerated treatment well Patient left: with chair alarm set;in chair;with call bell/phone within reach   PT Visit Diagnosis: Unsteadiness on feet (R26.81);History of falling (Z91.81);Other abnormalities of gait and mobility (R26.89);Muscle weakness (generalized) (M62.81)    Time: 1610-9604 PT Time Calculation (min) (ACUTE ONLY): 19 min  Charges:   PT Evaluation $PT Eval Low Complexity: 1 Low          Aleda Grana, PT, DPT 10/10/22, 11:08 AM   Sandi Mariscal 10/10/2022, 11:06 AM

## 2022-10-10 NOTE — Progress Notes (Signed)
Initial Nutrition Assessment  DOCUMENTATION CODES:   Severe malnutrition in context of chronic illness, Underweight  INTERVENTION:  Continue Regular diet per ST  Ensure Enlive po TID, each supplement provides 350 kcal and 20 grams of protein. MVI with minerals daily Request updated measured weight  NUTRITION DIAGNOSIS:   Severe Malnutrition related to chronic illness (end stage COPD) as evidenced by severe muscle depletion, severe fat depletion.  GOAL:   Patient will meet greater than or equal to 90% of their needs  MONITOR:   PO intake, Supplement acceptance, Labs, Weight trends, Skin  REASON FOR ASSESSMENT:   Consult Poor PO  ASSESSMENT:   Pt admitted with COPD exacerbation and possible CAP. PMH significant for COPD on [place] 2-3L/min, chronic tobacco use, alcohol use, HTN, chronic back pain, malnutrition, recently admitted 2/29-3/3 for COPD exacerbation and PNA.  Seen by ST this admission and prior admission. Prior esophagram 09/2021 found to have nonspecific esophageal dysmotility disorder. Recommend this admission to continue with regular diet, thin liquids this admission with appropriate precautions.   Palliative following for end stage COPD. Pt wishes for DNR/DNI but full scope otherwise. Continue to follow pending clinical course.   Spoke with pt at bedside. He is very pleasant and in good spirits.   Pt reports having a good appetite. Observed sandwich with 2 banana pudding cups and 2 bananas on bedside table uneaten which was delivered for lunch about 3 hours ago, however pt reports being unable to eat d/t visits from providers.   He states at home he was eating at his baseline. He recalls eating a large breakfast including 4 eggs, 4 sausage, liver pudding, grits and juice for breakfast. He likes to snack on cookies throughout the day. He has a table and mini fridge at bedside with snacks and reports eating a "good sized snack" around 2-3 am. He recalls drinking a 6 pack  of Ensure daily.   Meal completions: 4/23: 50% breakfast  Pt reports weighing about 120 lbs his whole life. He reports a weight loss of 40 lbs in over a year, though he is confused as he reports being told this is d/t his COPD exacerbation. Noted palliative discussed this is a typical occurrence for people with COPD exacerbation. Explained to pt that this is common and also occurs d/t increased nutritional needs r/t COPD.   Reviewed wight history. Noted pt's weight to be 45.7 kg on 09/27/21 and a weight of 40.8 kg on 08/17/22. Documented weight on 04/20 is likely stated versus actual measured weight. Will request updated weight to assess for changes to weight status.   Medications: solu-medrol, IV abx  Labs: sodium 133  NUTRITION - FOCUSED PHYSICAL EXAM: With noted muscle and fat deficits and ongoing weight loss, this is indicative of chronic undernutrition despite pt's report of a large meal and intake of 6 Ensure daily.  Flowsheet Row Most Recent Value  Orbital Region Severe depletion  Upper Arm Region Severe depletion  Thoracic and Lumbar Region Severe depletion  Buccal Region Severe depletion  Temple Region Severe depletion  Clavicle Bone Region Severe depletion  Clavicle and Acromion Bone Region Severe depletion  Scapular Bone Region Severe depletion  Dorsal Hand Severe depletion  Patellar Region Severe depletion  Anterior Thigh Region Severe depletion  Posterior Calf Region Severe depletion  Edema (RD Assessment) None  Hair Reviewed  Eyes Reviewed  Mouth Other (Comment)  [edentulous, wears denture]  Skin Reviewed  Nails Reviewed       Diet Order:  Diet Order             Diet regular Room service appropriate? Yes; Fluid consistency: Thin  Diet effective now                   EDUCATION NEEDS:   Education needs have been addressed  Skin:  Skin Assessment: Skin Integrity Issues: Skin Integrity Issues:: Other (Comment) Other: non-pressure wound-  coccyx  Last BM:  4/23  Height:   Ht Readings from Last 1 Encounters:  10/07/22  (1.778 m)    Weight:   Wt Readings from Last 1 Encounters:  10/07/22 40.8 kg    Ideal Body Weight:  75.5 kg  BMI:  There is no height or weight on file to calculate BMI.  Estimated Nutritional Needs:   Kcal:  1900-2100  Protein:  95-105g  Fluid:  >/=1.9L  Drusilla Kanner, RDN, LDN Clinical Nutrition

## 2022-10-10 NOTE — Consult Note (Signed)
Consultation Note Date: 10/10/2022   Patient Name: Jaime Cline  DOB: Jan 31, 1961  MRN: [id number]  Age / Sex: 62 y.o., male  PCP: Patient, [name] Referring Physician: Glade Lloyd, MD  Reason for Consultation: Establishing goals of care end-stage COPD exacerbation, ?  Qualify for home hospice  HPI/Patient Profile: 62 y.o. male  with past medical history of COPD on home oxygen via nasal cannula 2 to 3 L/min, chronic tobacco use, alcohol abuse, hypertension, chronic back pain, malnutrition, recent admission from 08/17/2022-08/20/2022 for COPD exacerbation and pneumonia,  admitted on 10/08/2022 with worsening shortness of breath.   Patient was also seen in the ED yesterday for dyspnea but sent home.  Had not taken home medications for about 2 weeks.  Patient currently admitted for possible right basilar bacterial pneumonia, COPD exacerbation acute on chronic respiratory failure with hypoxia, failure to thrive. PMT has been consulted to assist with goals of care conversation and discussion of the option of home hospice.  Clinical Assessment and Goals of Care:  I have reviewed medical records including EPIC notes, labs and imaging, assessed the patient and then met at the bedside to discuss diagnosis prognosis, GOC, EOL wishes, disposition and options.  I introduced Palliative Medicine as specialized medical care for people living with serious illness. It focuses on providing relief from the symptoms and stress of a serious illness. The goal is to improve quality of life for both the patient and the family.  We discussed a brief life review of the patient and then focused on their current illness.  The natural disease trajectory and expectations at EOL were discussed.  I attempted to elicit values and goals of care important to the patient.    Medical History Review and Understanding:  We discussed patient's chronic  comorbidities and the "ups and downs" he has had since the last time he had a discussion with PMT at [facility] (03/2022).  He did not quite grasp the severity of his illness, as he asked me what "end-stage COPD"  means. Education was provided.  Social History: Patient confirms that he still lives with his sister-in-law and son. He enjoys walking around, lamenting that this has gotten much more difficult for him over the past 2 weeks.  He is a strong Curator.  He previously enjoyed fishing and spending time outdoors, which he hopes to get back to doing.  Functional and Nutritional State: Patient reports his appetite is not what it should be.  He can only walk a few feet due to worsening dyspnea on exertion.  Albumin of 3.2 noted on 4/22.  Palliative Symptoms: Bilateral leg/knee pain, dyspnea, cough  Advance Directives: A detailed discussion regarding advanced directives was had.  No HCPOA documentation on file.  Code Status: Concepts specific to code status, artifical feeding and hydration, and rehospitalization were considered and discussed.  DNR/DNI confirmed.  Discussion: Patient recalls prior conversations with PMT  and is open to revisiting goals of care discussions during this admission.  He states things have been up and down over the past 6 months and reiterates his difficulty with obtaining medications that he is supposed to be taking.  He attributes his current COPD exacerbation to his lack of medications.  He is also concerned about his significant weight loss and states he was told this is also due to COPD, which he does not believe.  Counseled patient that this is actually a common finding in patients who have progressed to end-stage COPD and unintentional weight loss is  viewed as a criteria for eligibility for hospice services.  Reviewed prognosis of less than 6 months and that COPD is not a reversible disease process once damage has occurred.  He verbalizes understanding and appreciation  of my honesty.  His goal is to attempt rehab at SNF again and improve his mobility, which would improve his quality of life.  He states "hospice is the last thing I am thinking about."  He understands the importance of continuing to reflect on his boundaries and care preferences should his suffering increase.  His goals also include obtaining transportation assistance and [employer] portable oxygen.  He tells me that outpatient palliative care was never arranged after his previous hospitalizations and he remains open to a referral.   The difference between aggressive medical intervention and comfort care was considered in light of the patient's goals of care. Hospice and Palliative Care services outpatient were explained and offered.   Discussed the importance of continued conversation with family and the medical providers regarding overall plan of care and treatment options, ensuring decisions are within the context of the patient's values and GOCs.   Questions and concerns were addressed.  Hard Choices booklet left for review. The family was encouraged to call with questions or concerns.  PMT will continue to support holistically.   SUMMARY OF RECOMMENDATIONS   -Continue DNR/DNI -Continue full scope otherwise -Patient is very hopeful for improvement and wishes to attempt rehab again -His goal is to improve his mobility/independence, obtain transportation assistance, and portable oxygen -Patient acknowledges information regarding his poor prognosis, however does not seem emotionally ready to process this in great depth -TOC assistance appreciated with the above as well as outpatient palliative care referral -Ongoing goals of care discussions pending clinical course -Psychosocial and emotional support provided -PMT will continue to follow and support   Prognosis:  < 6 months  Discharge Planning: To Be Determined      Primary Diagnoses: Present on Admission:  COPD exacerbation  Physical  Exam Vitals and nursing note reviewed.  Constitutional:      General: He is not in acute distress.    Appearance: He is ill-appearing.     Interventions: Nasal cannula in place.     Comments: 4L  Cardiovascular:     Rate and Rhythm: Normal rate.  Pulmonary:     Effort: Pulmonary effort is normal. No respiratory distress.  Neurological:     Mental Status: He is alert.  Psychiatric:        Mood and Affect: Mood normal.        Behavior: Behavior normal.    Vital Signs: BP (!) 157/92 (BP Location: Right Arm)   Pulse (!) 101   Temp 98 F (36.7 C)   Resp 18   SpO2 100%  Pain Scale: 0-10   Pain Score: 0-No pain   SpO2: SpO2: 100 % O2 Device:SpO2: 100 % O2 Flow Rate: .O2 Flow Rate (L/min): 4 L/min   Palliative Assessment/Data: 50%     MDM: high    Rocky Gladden Jeni Salles, PA-C  Palliative Medicine Team Team phone # [phone]  Thank you for allowing the Palliative Medicine Team to assist in the care of this patient. Please utilize secure chat with additional questions, if there is no response within 30 minutes please call the above phone number.  Palliative Medicine Team providers are available by phone from 7am to 7pm daily and can be reached through the team cell phone.  Should this patient require assistance outside of these  hours, please call the patient's attending physician.

## 2022-10-10 NOTE — Plan of Care (Signed)
  Problem: Education: Goal: Knowledge of disease or condition will improve Outcome: Progressing   Problem: Respiratory: Goal: Ability to maintain a clear airway will improve Outcome: Progressing Goal: Levels of oxygenation will improve Outcome: Progressing Goal: Ability to maintain adequate ventilation will improve Outcome: Progressing

## 2022-10-10 NOTE — Progress Notes (Signed)
Patient had a lot of questions regarding his at home medications and about discharge. He said he talked with the MD about it when he stopped by this AM. Patient asked for an incentive spirometer so he can get up the phlegm in his throat. I placed an IS at bedside. He is also concerned about not getting any sleep. I let him know that he has sleep medication in his chart for bedtime and to ask for it tonight.

## 2022-10-10 NOTE — Progress Notes (Signed)
PROGRESS NOTE    Jaime Cline  ZOX:[id number] DOB: 18-Aug-1960 DOA: 10/08/2022 PCP: Patient, [name]   Brief Narrative:   62 y.o. male with medical history significant of COPD on home oxygen via nasal cannula 2 to 3 L/min, chronic tobacco use, alcohol abuse, hypertension, chronic back pain, malnutrition, recent admission from 08/17/2022-08/20/2022 for COPD exacerbation and pneumonia presented with worsening shortness of breath.  On presentation, he initially needed 10 L oxygen via nasal cannula. Chest x-ray showed possible new patchy right basilar opacity, suspicious for pneumonia. He was started on Rocephin, azithromycin and also IV Solu-Medrol was given.   Assessment & Plan:   Possible community-acquired right basilar bacterial pneumonia COPD exacerbation Acute on chronic respiratory failure with hypoxia Tobacco abuse ongoing Severe malnutrition/failure to thrive -Patient presented with worsening dyspnea and was found to be tachypneic, tachycardic and required 10 L oxygen via nasal cannula initially in the ED.  Normally uses 2 to 3 L oxygen via nasal cannula.  Currently on 2 L oxygen via nasal cannula.  Patient continues to smoke cigarettes. -Chest x-ray showed possible right basilar pneumonia.  Currently on Rocephin and Zithromax.  Decree Solu-Medrol to 80 mg IV daily.  Continue nebs and Dulera. COVID-19/RSV/influenza testing negative.  Respiratory virus panel positive for rhinovirus. -Diet as per SLP recommendations. -Counseled regarding tobacco cessation -Nutrition consult.  Palliative care consultation for goals of care discussion is pending: Might qualify for home hospice.  Overall prognosis is very poor.   Hyponatremia -Mild.  Monitor routinely.  Increase oral intake.  Hypomagnesemia -No labs today.  Normocytic anemia -Questionable cause.  Hemoglobin currently stable.  Monitor intermittently.   Depression -Continue sertraline and as needed Xanax.  Apparently patient has not  been taking any medications for the last 2 weeks as he ran out of them.   DVT prophylaxis: Lovenox Code Status: DNR.   Family Communication: None at bedside Disposition Plan: Status is: to inpatient because: Of severity of illness.  Need for IV steroids and antibiotics  Consultants: Palliative care  Procedures: None  Antimicrobials: Rocephin and Zithromax from 10/08/2022 onwards   Subjective: Patient seen and examined at bedside.  Complains of shortness of breath with even mild exertion but feels slightly better.  Does not feel ready to go home today.  No fever, vomiting, chest pain reported. Objective: Vitals:   10/09/22 1929 10/10/22 0004 10/10/22 0243 10/10/22 0403  BP:    (!) 152/96  Pulse:  99 97 (!) 101  Resp:  20 20 16   Temp:    98 F (36.7 C)  TempSrc:      SpO2: 95% 96% 95% 98%    Intake/Output Summary (Last 24 hours) at 10/10/2022 0803 Last data filed at 10/10/2022 0244 Gross per 24 hour  Intake --  Output 550 ml  Net -550 ml   There were no vitals filed for this visit.  Examination:  General: On 2 L oxygen via nasal cannula.  Looks chronically ill and deconditioned.  Extremely thinly built.  No distress ENT/neck: No thyromegaly.  JVD is not elevated  respiratory: Decreased breath sounds at bases bilaterally with some crackles; some wheezing present CVS: S1-S2 heard, mild intermittent tachycardia present Abdominal: Soft, nontender, slightly distended; no organomegaly, bowel sounds normally heard  extremities: Trace lower extremity edema; no cyanosis  CNS: Awake and alert.  Slow to respond.  Poor historian.  No focal neurologic deficit.  Moves extremities Lymph: No obvious lymphadenopathy Skin: No obvious ecchymosis/lesions  psych: Affect is mostly flat.  Not agitated currently.   Musculoskeletal: No obvious joint swelling/deformity     Data Reviewed: I have personally reviewed following labs and imaging studies  CBC: Recent Labs  Lab 10/07/22 0815  10/08/22 1400 10/09/22 0048  WBC 8.3 5.2 4.0  NEUTROABS 6.1 3.4  --   HGB 13.8 12.8* 12.1*  HCT 41.6 38.9* 35.8*  MCV 99.8 100.0 98.1  PLT 180 203 205    Basic Metabolic Panel: Recent Labs  Lab 10/07/22 0815 10/08/22 1400 10/09/22 0048  NA 133* 133* 133*  K 3.3* 3.5 3.6  CL 98 99 96*  CO2 GLUCOSE 107* 95 166*  BUN CREATININE 0.78 0.69 0.83  CALCIUM 8.9 9.1 9.0  MG  --   --  1.6*    GFR: Estimated Creatinine Clearance: 53.9 mL/min (by C-G formula based on SCr of 0.83 mg/dL). Liver Function Tests: Recent Labs  Lab 10/09/22 0048  AST 26  ALT 18  ALKPHOS 47  BILITOT 0.6  PROT 6.7  ALBUMIN 3.2*    No results for input(s): "LIPASE", "AMYLASE" in the last 168 hours. No results for input(s): "AMMONIA" in the last 168 hours. Coagulation Profile: No results for input(s): "INR", "PROTIME" in the last 168 hours. Cardiac Enzymes: No results for input(s): "CKTOTAL", "CKMB", "CKMBINDEX", "TROPONINI" in the last 168 hours. BNP (last 3 results) No results for input(s): "PROBNP" in the last 8760 hours. HbA1C: No results for input(s): "HGBA1C" in the last 72 hours. CBG: No results for input(s): "GLUCAP" in the last 168 hours. Lipid Profile: No results for input(s): "CHOL", "HDL", "LDLCALC", "TRIG", "CHOLHDL", "LDLDIRECT" in the last 72 hours. Thyroid Function Tests: No results for input(s): "TSH", "T4TOTAL", "FREET4", "T3FREE", "THYROIDAB" in the last 72 hours. Anemia Panel: No results for input(s): "VITAMINB12", "FOLATE", "FERRITIN", "TIBC", "IRON", "RETICCTPCT" in the last 72 hours. Sepsis Labs: Recent Labs  Lab 10/10/22 0224  PROCALCITON 0.12    Recent Results (from the past 240 hour(s))  SARS Coronavirus 2 by RT PCR (hospital order, performed in [facility] hospital lab) *cepheid single result test* Anterior Nasal Swab     Status: None   Collection Time: 10/07/22  8:15 AM   Specimen: Anterior Nasal Swab  Result Value Ref Range Status   SARS  Coronavirus 2 by RT PCR NEGATIVE NEGATIVE Final    Comment: (NOTE) SARS-CoV-2 target nucleic acids are NOT DETECTED.  The SARS-CoV-2 RNA is generally detectable in upper and lower respiratory specimens during the acute phase of infection. The lowest concentration of SARS-CoV-2 viral copies this assay can detect is 250 copies / mL. A negative result does not preclude SARS-CoV-2 infection and should not be used as the sole basis for treatment or other patient management decisions.  A negative result may occur with improper specimen collection / handling, submission of specimen other than nasopharyngeal swab, presence of viral mutation(s) within the areas targeted by this assay, and inadequate number of viral copies (<250 copies / mL). A negative result must be combined with clinical observations, patient history, and epidemiological information.  Fact Sheet for Patients:   RoadLapTop.co.za  Fact Sheet for Healthcare Providers: http://kim-miller.com/  This test is not yet approved or  cleared by the [place] FDA and has been authorized for detection and/or diagnosis of SARS-CoV-2 by FDA under an Emergency Use Authorization (EUA).  This EUA will remain in effect (meaning this test can be used) for the duration of the COVID-19 declaration under Section 564(b)(1) of the Act, 21  U.S.C. section 360bbb-3(b)(1), unless the authorization is terminated or revoked sooner.  Performed at [facility], [address] [place], [place] [address]   Respiratory (~20 pathogens) panel by PCR     Status: Abnormal   Collection Time: 10/08/22  5:48 PM   Specimen: Nasopharyngeal Swab; Respiratory  Result Value Ref Range Status   Adenovirus NOT DETECTED NOT DETECTED Final   Coronavirus 229E NOT DETECTED NOT DETECTED Final    Comment: (NOTE) The Coronavirus on the Respiratory Panel, DOES NOT test for the novel  Coronavirus (2019  nCoV)    Coronavirus HKU1 NOT DETECTED NOT DETECTED Final   Coronavirus NL63 NOT DETECTED NOT DETECTED Final   Coronavirus OC43 NOT DETECTED NOT DETECTED Final   Metapneumovirus NOT DETECTED NOT DETECTED Final   Rhinovirus / Enterovirus DETECTED (A) NOT DETECTED Final   Influenza A NOT DETECTED NOT DETECTED Final   Influenza B NOT DETECTED NOT DETECTED Final   Parainfluenza Virus 1 NOT DETECTED NOT DETECTED Final   Parainfluenza Virus 2 NOT DETECTED NOT DETECTED Final   Parainfluenza Virus 3 NOT DETECTED NOT DETECTED Final   Parainfluenza Virus 4 NOT DETECTED NOT DETECTED Final   Respiratory Syncytial Virus NOT DETECTED NOT DETECTED Final   Bordetella pertussis NOT DETECTED NOT DETECTED Final   Bordetella Parapertussis NOT DETECTED NOT DETECTED Final   Chlamydophila pneumoniae NOT DETECTED NOT DETECTED Final   Mycoplasma pneumoniae NOT DETECTED NOT DETECTED Final    Comment: Performed at [facility], [address] [place], [place] [address]  Resp panel by RT-PCR (RSV, Flu A&B, Covid) Anterior Nasal Swab     Status: None   Collection Time: 10/08/22  5:48 PM   Specimen: Anterior Nasal Swab  Result Value Ref Range Status   SARS Coronavirus 2 by RT PCR NEGATIVE NEGATIVE Final   Influenza A by PCR NEGATIVE NEGATIVE Final   Influenza B by PCR NEGATIVE NEGATIVE Final    Comment: (NOTE) The Xpert Xpress SARS-CoV-2/FLU/RSV plus assay is intended as an aid in the diagnosis of influenza from Nasopharyngeal swab specimens and should not be used as a sole basis for treatment. Nasal washings and aspirates are unacceptable for Xpert Xpress SARS-CoV-2/FLU/RSV testing.  Fact Sheet for Patients: BloggerCourse.com  Fact Sheet for Healthcare Providers: SeriousBroker.it  This test is not yet approved or cleared by the [place] FDA and has been authorized for detection and/or diagnosis of SARS-CoV-2 by FDA under an Emergency Use  Authorization (EUA). This EUA will remain in effect (meaning this test can be used) for the duration of the COVID-19 declaration under Section 564(b)(1) of the Act, 21 U.S.C. section 360bbb-3(b)(1), unless the authorization is terminated or revoked.     Resp Syncytial Virus by PCR NEGATIVE NEGATIVE Final    Comment: (NOTE) Fact Sheet for Patients: BloggerCourse.com  Fact Sheet for Healthcare Providers: SeriousBroker.it  This test is not yet approved or cleared by the [place] FDA and has been authorized for detection and/or diagnosis of SARS-CoV-2 by FDA under an Emergency Use Authorization (EUA). This EUA will remain in effect (meaning this test can be used) for the duration of the COVID-19 declaration under Section 564(b)(1) of the Act, 21 U.S.C. section 360bbb-3(b)(1), unless the authorization is terminated or revoked.  Performed at [facility], [address], [place], [place] [address]          Radiology Studies: DG Chest 2 View  Result Date: 10/08/2022 CLINICAL DATA:  Shortness of breath EXAM: CHEST -  2 VIEW COMPARISON:  10/07/2022 FINDINGS: The heart size and mediastinal contours are within normal limits. Hyperinflated, hyperlucent lungs. New patchy right basilar opacity. Left lung is clear. No pleural effusion or pneumothorax. The visualized skeletal structures are unremarkable. IMPRESSION: 1. New patchy right basilar opacity, suspicious for pneumonia. 2. COPD. Electronically Signed   By: Duanne Guess D.O.   On: 10/08/2022 14:29        Scheduled Meds:  enoxaparin (LOVENOX) injection  30 mg Subcutaneous Q24H   gabapentin  200 mg Oral QHS   guaiFENesin  1,200 mg Oral BID   ipratropium-albuterol  3 mL Nebulization Q6H   methylPREDNISolone (SOLU-MEDROL) injection  80 mg Intravenous Q12H   mometasone-formoterol  2 puff Inhalation BID   sertraline  50 mg Oral Daily   Continuous Infusions:   azithromycin 500 mg (10/09/22 1731)   cefTRIAXone (ROCEPHIN)  IV 2 g (10/09/22 1627)          Glade Lloyd, MD [facility] 10/10/2022, 8:03 AM

## 2022-10-11 DIAGNOSIS — Z7189 Other specified counseling: Secondary | ICD-10-CM | POA: Diagnosis not present

## 2022-10-11 DIAGNOSIS — Z789 Other specified health status: Secondary | ICD-10-CM

## 2022-10-11 DIAGNOSIS — R0602 Shortness of breath: Secondary | ICD-10-CM

## 2022-10-11 DIAGNOSIS — Z711 Person with feared health complaint in whom no diagnosis is made: Secondary | ICD-10-CM

## 2022-10-11 DIAGNOSIS — Z66 Do not resuscitate: Secondary | ICD-10-CM

## 2022-10-11 DIAGNOSIS — J189 Pneumonia, unspecified organism: Secondary | ICD-10-CM | POA: Diagnosis not present

## 2022-10-11 DIAGNOSIS — Z515 Encounter for palliative care: Secondary | ICD-10-CM | POA: Diagnosis not present

## 2022-10-11 DIAGNOSIS — J441 Chronic obstructive pulmonary disease with (acute) exacerbation: Secondary | ICD-10-CM | POA: Diagnosis not present

## 2022-10-11 MED ORDER — AZITHROMYCIN 250 MG PO TABS
500.0000 mg | ORAL_TABLET | Freq: Every day | ORAL | Status: AC
  Administered 2022-10-11 – 2022-10-12 (×2): 500 mg via ORAL
  Filled 2022-10-11 (×2): qty 2

## 2022-10-11 MED ORDER — MAGNESIUM SULFATE 2 GM/50ML IV SOLN
2.0000 g | Freq: Once | INTRAVENOUS | Status: AC
  Administered 2022-10-11: 2 g via INTRAVENOUS
  Filled 2022-10-11: qty 50

## 2022-10-11 NOTE — Progress Notes (Signed)
Physical Therapy Treatment Patient Details Name: Jaime Cline MRN: [id number] DOB: [birth year] Today's Date: 10/11/2022   History of Present Illness Pt is a 62 y/o M admitted on 10/08/22 after presenting with c/o worsening SOB. Pt initially required 10L O2 & chest x-ray showed possible new patchy R basilar opacity, suspicious for PNA. Pt positive for rhinovirus/enterovirus. PMH: COPD on 2-3L home O2, chronic tobacco use, alcohol abuse, HTN, chronic back pain, malnutrition, recent admission 08/17/22-08/20/22 for COPD exacerbation & PNA    PT Comments    Pt seen for PT tx with pt agreeable to tx. Pt received with nasal cannula off - PT continues to educate pt on need for supplemental O2 use & when using it, to wear it in nostrils vs mouth. Pt on 4L/min throughout session with inconsistent pleth waveform so unsure of accuracy/how low SpO2 gets during session but >90% at beginning & end. Pt requires rest breaks throughout session 2/2 SOB with PT providing education/cuing for pursed lip breathing. Pt ambulates around unit with RW & CGA<>close supervision with education re: proper use of AD but poor return demo. Pt would benefit from continued PT services to address strength, balance, gait & stair negotiation.    Recommendations for follow up therapy are one component of a multi-disciplinary discharge planning process, led by the attending physician.  Recommendations may be updated based on patient status, additional functional criteria and insurance authorization.  Follow Up Recommendations       Assistance Recommended at Discharge Intermittent Supervision/Assistance  Patient can return home with the following A little help with walking and/or transfers;Assistance with cooking/housework;A little help with bathing/dressing/bathroom;Help with stairs or ramp for entrance;Assist for transportation   Equipment Recommendations  Rolling walker (2 wheels)    Recommendations for Other Services        Precautions / Restrictions Precautions Precautions: Fall Restrictions Weight Bearing Restrictions: No     Mobility  Bed Mobility Overal bed mobility: Modified Independent Bed Mobility: Supine to Sit     Supine to sit: Modified independent (Device/Increase time), HOB elevated          Transfers   Equipment used: None Transfers: Sit to/from Stand Sit to Stand: Independent           General transfer comment: Pt able to transfer STS from EOB without assistance without AD, when using RW pt requires cuing for hand placement to push to standing.    Ambulation/Gait Ambulation/Gait assistance: Min guard, Supervision Gait Distance (Feet): 110 Feet Assistive device: Rolling walker (2 wheels) Gait Pattern/deviations: Step-through pattern, Decreased stride length, Trunk flexed Gait velocity: decreased     General Gait Details: Education to ambulate within base of AD but poor return demo. Pt requires 2-3 standing rest breaks 2/2 SOB during gait.   Stairs             Wheelchair Mobility    Modified Rankin (Stroke Patients Only)       Balance Overall balance assessment: Needs assistance, History of Falls Sitting-balance support: Feet supported Sitting balance-Leahy Scale: Fair     Standing balance support: During functional activity, Bilateral upper extremity supported, Reliant on assistive device for balance Standing balance-Leahy Scale: Fair                              Cognition Arousal/Alertness: Awake/alert Behavior During Therapy: WFL for tasks assessed/performed Overall Cognitive Status: No family/caregiver present to determine baseline cognitive functioning  General Comments: decreased safety awareness especially in regards to O2 use        Exercises Other Exercises Other Exercises: Pt performs 5x STS x 2 sets without BUE support with task focusing on BLE strengthening & endurance  training; pt requires seated rest break between each set 2/2 SOB.    General Comments        Pertinent Vitals/Pain Pain Assessment Pain Assessment: No/denies pain    Home Living                          Prior Function            PT Goals (current goals can now be found in the care plan section) Acute Rehab PT Goals Patient Stated Goal: get stronger, get better PT Goal Formulation: With patient Time For Goal Achievement: 10/24/22 Potential to Achieve Goals: Good Progress towards PT goals: Progressing toward goals    Frequency    Min 3X/week      PT Plan Current plan remains appropriate    Co-evaluation              AM-PAC PT "6 Clicks" Mobility   Outcome Measure  Help needed turning from your back to your side while in a flat bed without using bedrails?: None Help needed moving from lying on your back to sitting on the side of a flat bed without using bedrails?: None Help needed moving to and from a bed to a chair (including a wheelchair)?: A Little Help needed standing up from a chair using your arms (e.g., wheelchair or bedside chair)?: A Little Help needed to walk in hospital room?: A Little Help needed climbing 3-5 steps with a railing? : A Little 6 Click Score: 20    End of Session Equipment Utilized During Treatment: Oxygen Activity Tolerance: Patient tolerated treatment well Patient left: in chair;with chair alarm set;with call bell/phone within reach   PT Visit Diagnosis: Unsteadiness on feet (R26.81);History of falling (Z91.81);Other abnormalities of gait and mobility (R26.89);Muscle weakness (generalized) (M62.81)     Time: 1610-9604 PT Time Calculation (min) (ACUTE ONLY): 24 min  Charges:  $Therapeutic Activity: 23-37 mins                     Aleda Grana, PT, DPT 10/11/22, 3:02 PM   Sandi Mariscal 10/11/2022, 3:00 PM

## 2022-10-11 NOTE — Progress Notes (Signed)
Daily Progress Note   Patient Name: Jaime Cline       Date: 10/11/2022 DOB: 05-Jul-1960  Age: 62 y.o. MRN#: [id number] Attending Physician: Dorcas Carrow, MD Primary Care Physician: Patient, [name] Admit Date: 10/08/2022  Reason for Consultation/Follow-up: Establishing goals of care  Subjective:  12:40 PM I have reviewed medical records including EPIC notes, MAR, and labs. Received report from primary RN - no acute concerns.  Went to visit patient at bedside - no family/visitors present. Patient was lying in bed asleep - I did not attempt to wake him to preserve comfort. No signs or non-verbal gestures of pain or discomfort noted. No respiratory distress, mild increased work of breathing, or secretions noted. He is on 4L O2 [place].  4:00 PM Went back to patient's bedside - he is awake, alert, oriented, and able to participate in conversation. He [place] up sitting in the chair. No signs or non-verbal gestures of pain or discomfort noted. No respiratory distress, mild increased work of breathing, or secretions noted. Patient endorses feeling short of breath, though notes this is "always" and not new. He denies pain; reports feeling "half and half" but "better after his nap." He also is feeding himself a "late lunch." 25% eaten off his tray.  Emotional support provided to patient. Therapeutic listening provided as he reflects over his interval history since admission. He has no questions or follow up needs from PMT meeting yesterday. He confirms his goal is for discharge to rehab with outpatient Palliative Care to follow.  Introduced, reviewed, but did not finalize MOST form. Patient would like to review and reflect on information. If he would like to complete prior to discharge he was instructed to let  nursing staff know so PMT can return to assist with completion. He also understands it can be completed outpatient.   Patient requests his IV be "covered." Wrapped IV with kerlix. Site was clean, dry, intact.   All questions and concerns addressed. Encouraged to call with questions and/or concerns. PMT card provided.  Reached out to PT for clarification on if he qualifies for SNF.   Length of Stay: 2  Current Medications: Scheduled Meds:   azithromycin  500 mg Oral Daily   enoxaparin (LOVENOX) injection  30 mg Subcutaneous Q24H   feeding supplement  237 mL Oral TID BM  guaiFENesin  1,200 mg Oral BID   ipratropium-albuterol  3 mL Nebulization Q6H   methylPREDNISolone (SOLU-MEDROL) injection  80 mg Intravenous Q24H   mometasone-formoterol  2 puff Inhalation BID   multivitamin with minerals  1 tablet Oral Daily   sertraline  50 mg Oral Daily    Continuous Infusions:  cefTRIAXone (ROCEPHIN)  IV Stopped (10/10/22 1839)    PRN Meds: acetaminophen **OR** acetaminophen, albuterol, ALPRAZolam, ondansetron **OR** ondansetron (ZOFRAN) IV, traZODone  Physical Exam Vitals and nursing note reviewed.  Constitutional:      General: He is not in acute distress. Pulmonary:     Effort: No respiratory distress.     Comments: Short of breath Skin:    General: Skin is warm and dry.  Neurological:     Mental Status: He is alert and oriented to person, place, and time.     Motor: Weakness present.  Psychiatric:        Attention and Perception: Attention normal.        Behavior: Behavior is cooperative.        Cognition and Memory: Cognition and memory normal.             Vital Signs: BP 108/76   Pulse 99   Temp 97.8 F (36.6 C) (Oral)   Resp 18   Wt 44.1 kg   SpO2 97%   BMI 13.95 kg/m  SpO2: SpO2: 97 % O2 Device: O2 Device: Nasal Cannula O2 Flow Rate: O2 Flow Rate (L/min): 4 L/min  Intake/output summary:  Intake/Output Summary (Last 24 hours) at 10/11/2022 1240 Last data filed  at 10/11/2022 1100 Gross per 24 hour  Intake 1326.11 ml  Output 200 ml  Net 1126.11 ml   LBM: Last BM Date : 10/10/22 Baseline Weight: Weight: 45.8 kg Most recent weight: Weight: 44.1 kg       Palliative Assessment/Data: PPS 50%      Patient Active Problem List   Diagnosis Date Noted   AKI (acute kidney injury) 08/18/2022   Metabolic acidosis, increased anion gap 08/18/2022   Depression 08/18/2022   Dyspepsia 08/18/2022   Sepsis due to pneumonia 08/17/2022   Dyspnea 04/12/2022   Palliative care encounter 04/11/2022   Goals of care, counseling/discussion 04/11/2022   Counseling and coordination of care 04/11/2022   COPD with acute exacerbation 04/10/2022   Syncope 09/28/2021   Protein-calorie malnutrition, severe 09/27/2021   Hyponatremia    Hypotension due to hypovolemia    Syncope and collapse 09/26/2021   CAP (community acquired pneumonia)    COPD exacerbation 08/27/2021   Asthma, chronic 12/31/2012   Smoker 12/31/2012   Knee pain, bilateral 12/31/2012   Iron deficiency anemia 12/31/2012   Knee pain, chronic 12/31/2012    Palliative Care Assessment & Plan   Patient Profile: 62 y.o. male  with past medical history of COPD on home oxygen via nasal cannula 2 to 3 L/min, chronic tobacco use, alcohol abuse, hypertension, chronic back pain, malnutrition, recent admission from 08/17/2022-08/20/2022 for COPD exacerbation and pneumonia,  admitted on 10/08/2022 with worsening shortness of breath.    Patient was also seen in the ED yesterday for dyspnea but sent home.  Had not taken home medications for about 2 weeks.  Patient currently admitted for possible right basilar bacterial pneumonia, COPD exacerbation acute on chronic respiratory failure with hypoxia, failure to thrive. PMT has been consulted to assist with goals of care conversation and discussion of the option of home hospice.  Assessment: Principal Problem:   COPD exacerbation  Concern about end of  life  Recommendations/Plan: Continue to treat the treatable Continue DNR/DNI as previously documented - durable DNR form completed and placed in shadow chart. Copy was made and will be scanned into Vynca/ACP tab Patient confirms goal is for discharge to rehab with outpatient Palliative Care to follow; however, upon further reviewing documentation he does not qualify for rehab and is considering home with hospice. Patient did not mention this - will follow up tomorrow 4/25 MOST form introduced, reviewed, but not finalized - he requested to reflect on information given PMT will continue to follow and support holistically  Goals of Care and Additional Recommendations: Limitations on Scope of Treatment: Full Scope Treatment  Code Status:    Code Status Orders  (From admission, onward)           Start     Ordered   10/08/22 1715  Do not attempt resuscitation (DNR)  Continuous       Question Answer Comment  If patient has no pulse and is not breathing Do Not Attempt Resuscitation   If patient has a pulse and/or is breathing: Medical Treatment Goals LIMITED ADDITIONAL INTERVENTIONS: Use medication/IV fluids and cardiac monitoring as indicated; Do not use intubation or mechanical ventilation (DNI), also provide comfort medications.  Transfer to Progressive/Stepdown as indicated, avoid Intensive Care.   Consent: Discussion documented in EHR or advanced directives reviewed      10/08/22 1718           Code Status History     Date Active Date Inactive Code Status Order ID Comments User Context   08/17/2022 1342 08/20/2022 2329 DNR [id number]  Steffanie Rainwater, MD ED   04/11/2022 0955 04/20/2022 0006 DNR [id number]  Alena Bills, DO Inpatient   04/10/2022 2200 04/11/2022 0954 Full Code [id number]  Dorcas Carrow, MD Inpatient   01/24/2022 2347 01/27/2022 2033 Full Code [id number]  Eduard Clos, MD ED   09/26/2021 0437 10/03/2021 2043 Full Code [id number]  Marolyn Haller, MD ED    08/27/2021 1447 08/29/2021 1948 Full Code [id number]  Steffanie Rainwater, MD ED   03/15/2012 0317 03/15/2012 1159 Full Code [id number]  Schinlever, Arie Sabina, PA ED       Prognosis:  < 6 months  Discharge Planning: To Be Determined  Care plan was discussed with primary RN, patient, PT  Thank you for allowing the Palliative Medicine Team to assist in the care of this patient.   Haskel Khan, NP  Please contact Palliative Medicine Team phone at [phone] for questions and concerns.   *Portions of this note are a verbal dictation therefore any spelling and/or grammatical errors are due to the "Dragon Medical One" system interpretation.

## 2022-10-11 NOTE — Progress Notes (Signed)
PROGRESS NOTE    Jaime Cline  XBJ:[id number] DOB: 04/26/1961 DOA: 10/08/2022 PCP: Patient, [name]   Brief Narrative:   62 y.o. 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  On presentation, he initially needed 10 L oxygen via nasal cannula. Chest x-ray showed possible new patchy right basilar opacity, suspicious for pneumonia. He was started on Rocephin, azithromycin and also IV Solu-Medrol was given.   Assessment & Plan:   COPD with acute exacerbation, possible community-acquired right basilar bacterial pneumonia Acute on chronic respiratory failure with hypoxia Ongoing smoker. Severe malnutrition/failure to thrive  -Patient presented with worsening dyspnea and was found to be tachypneic, tachycardic and required 10 L oxygen via nasal cannula initially in the ED.  Normally uses 2 to 3 L oxygen via nasal cannula.  Some stabilization today.  Continues to remain symptomatic.  -Chest x-ray showed possible right basilar pneumonia.  Currently on Rocephin and Zithromax.  Solu-Medrol to 80 mg IV daily.  Continue nebs and Dulera. COVID-19/RSV/influenza testing negative.  Respiratory virus panel positive for rhinovirus. -Diet as per SLP recommendations. -Counseled regarding tobacco cessation -Overall poor prognosis.  He does qualify to enroll into hospice program at home.  Patient is agreeable for hospice services at home.  Followed by palliative.   Hyponatremia -Mild.  Monitor routinely.  Increase oral intake.  Hypomagnesemia -Replaced.  Normocytic anemia -Questionable cause.  Hemoglobin currently stable.  Monitor intermittently.   Depression -Continue sertraline and as needed Xanax.  Apparently patient has not been taking any  medications for the last 2 weeks as he ran out of them.   DVT prophylaxis: Lovenox Code Status: DNR.   Family Communication: None at bedside Disposition Plan: Status is: to inpatient because: Of severity of illness.  Need for IV steroids and antibiotics.  Persistently symptomatic.  Consultants: Palliative care  Procedures: None  Antimicrobials: Rocephin and Zithromax from 10/08/2022 onwards   Subjective: Patient seen and examined.  He tells me he is still feels chest tightness and wheezing.  Better than before.  Discussed about long-term management plan and palliative care discussion about palliation and hospice follow-up. Patient wants to see if he can go to a SNF, however he did walk with good mobility with physical therapy and he does not qualify to go to SNF. He is interested on additional hospice services at home, will discuss with palliative care to reinforce his understanding of hospice services.   Objective: Vitals:   10/11/22 0318 10/11/22 0847 10/11/22 0901 10/11/22 0949  BP:  108/76    Pulse:  (!) 117 (!) 101 99  Resp:      Temp:  97.8 F (36.6 C)    TempSrc:  Oral    SpO2:  97%    Weight: 44.1 kg       Intake/Output Summary (Last 24 hours) at 10/11/2022 1346 Last data filed at 10/11/2022 1100 Gross per 24 hour  Intake 1326.11 ml  Output 200 ml  Net 1126.11 ml   Filed Weights   10/10/22 1811 10/11/22 0318  Weight: 45.8 kg 44.1 kg    Examination:  General: Chronically sick looking.  Frail and debilitated.  Cachectic.  On 2 L oxygen. Cardiovascular: S1-S2 normal regular rate rhythm. Respiratory: Bilateral diffuse expiratory wheezes.  Not in any distress at rest.  Slightly  tachypneic on communication. Gastrointestinal: Soft.  Nontender.  Bowel sound present. Ext: No edema or swelling. Neuro: Alert and awake.  Oriented.     Data Reviewed: I have personally reviewed following labs and imaging studies  CBC: Recent Labs  Lab 10/07/22 0815 10/08/22 1400  10/09/22 0048  WBC 8.3 5.2 4.0  NEUTROABS 6.1 3.4  --   HGB 13.8 12.8* 12.1*  HCT 41.6 38.9* 35.8*  MCV 99.8 100.0 98.1  PLT 180 203 205   Basic Metabolic Panel: Recent Labs  Lab 10/07/22 0815 10/08/22 1400 10/09/22 0048  NA 133* 133* 133*  K 3.3* 3.5 3.6  CL 98 99 96*  CO2 GLUCOSE 107* 95 166*  BUN CREATININE 0.78 0.69 0.83  CALCIUM 8.9 9.1 9.0  MG  --   --  1.6*   GFR: Estimated Creatinine Clearance: 58.3 mL/min (by C-G formula based on SCr of 0.83 mg/dL). Liver Function Tests: Recent Labs  Lab 10/09/22 0048  AST 26  ALT 18  ALKPHOS 47  BILITOT 0.6  PROT 6.7  ALBUMIN 3.2*   No results for input(s): "LIPASE", "AMYLASE" in the last 168 hours. No results for input(s): "AMMONIA" in the last 168 hours. Coagulation Profile: No results for input(s): "INR", "PROTIME" in the last 168 hours. Cardiac Enzymes: No results for input(s): "CKTOTAL", "CKMB", "CKMBINDEX", "TROPONINI" in the last 168 hours. BNP (last 3 results) No results for input(s): "PROBNP" in the last 8760 hours. HbA1C: No results for input(s): "HGBA1C" in the last 72 hours. CBG: No results for input(s): "GLUCAP" in the last 168 hours. Lipid Profile: No results for input(s): "CHOL", "HDL", "LDLCALC", "TRIG", "CHOLHDL", "LDLDIRECT" in the last 72 hours. Thyroid Function Tests: No results for input(s): "TSH", "T4TOTAL", "FREET4", "T3FREE", "THYROIDAB" in the last 72 hours. Anemia Panel: No results for input(s): "VITAMINB12", "FOLATE", "FERRITIN", "TIBC", "IRON", "RETICCTPCT" in the last 72 hours. Sepsis Labs: Recent Labs  Lab 10/10/22 0224  PROCALCITON 0.12    Recent Results (from the past 240 hour(s))  SARS Coronavirus 2 by RT PCR (hospital order, performed in [facility] hospital lab) *cepheid single result test* Anterior Nasal Swab     Status: None   Collection Time: 10/07/22  8:15 AM   Specimen: Anterior Nasal Swab  Result Value Ref Range Status   SARS Coronavirus 2 by RT  PCR NEGATIVE NEGATIVE Final    Comment: (NOTE) SARS-CoV-2 target nucleic acids are NOT DETECTED.  The SARS-CoV-2 RNA is generally detectable in upper and lower respiratory specimens during the acute phase of infection. The lowest concentration of SARS-CoV-2 viral copies this assay can detect is 250 copies / mL. A negative result does not preclude SARS-CoV-2 infection and should not be used as the sole basis for treatment or other patient management decisions.  A negative result may occur with improper specimen collection / handling, submission of specimen other than nasopharyngeal swab, presence of viral mutation(s) within the areas targeted by this assay, and inadequate number of viral copies (<250 copies / mL). A negative result must be combined with clinical observations, patient history, and epidemiological information.  Fact Sheet for Patients:   RoadLapTop.co.za  Fact Sheet for Healthcare Providers: http://kim-miller.com/  This test is not yet approved or  cleared by the [place] FDA and has been authorized for detection and/or diagnosis of SARS-CoV-2 by FDA under an Emergency Use Authorization (EUA).  This EUA will remain in effect (meaning this test can be used) for the duration of the  COVID-19 declaration under Section 564(b)(1) of the Act, 21 U.S.C. section 360bbb-3(b)(1), unless the authorization is terminated or revoked sooner.  Performed at [facility], [address], [place], [place] [address]   Respiratory (~20 pathogens) panel by PCR     Status: Abnormal   Collection Time: 10/08/22  5:48 PM   Specimen: Nasopharyngeal Swab; Respiratory  Result Value Ref Range Status   Adenovirus NOT DETECTED NOT DETECTED Final   Coronavirus 229E NOT DETECTED NOT DETECTED Final    Comment: (NOTE) The Coronavirus on the Respiratory Panel, DOES NOT test for the novel  Coronavirus (2019 nCoV)    Coronavirus  HKU1 NOT DETECTED NOT DETECTED Final   Coronavirus NL63 NOT DETECTED NOT DETECTED Final   Coronavirus OC43 NOT DETECTED NOT DETECTED Final   Metapneumovirus NOT DETECTED NOT DETECTED Final   Rhinovirus / Enterovirus DETECTED (A) NOT DETECTED Final   Influenza A NOT DETECTED NOT DETECTED Final   Influenza B NOT DETECTED NOT DETECTED Final   Parainfluenza Virus 1 NOT DETECTED NOT DETECTED Final   Parainfluenza Virus 2 NOT DETECTED NOT DETECTED Final   Parainfluenza Virus 3 NOT DETECTED NOT DETECTED Final   Parainfluenza Virus 4 NOT DETECTED NOT DETECTED Final   Respiratory Syncytial Virus NOT DETECTED NOT DETECTED Final   Bordetella pertussis NOT DETECTED NOT DETECTED Final   Bordetella Parapertussis NOT DETECTED NOT DETECTED Final   Chlamydophila pneumoniae NOT DETECTED NOT DETECTED Final   Mycoplasma pneumoniae NOT DETECTED NOT DETECTED Final    Comment: Performed at [facility], [address] [place], [place] [address]  Resp panel by RT-PCR (RSV, Flu A&B, Covid) Anterior Nasal Swab     Status: None   Collection Time: 10/08/22  5:48 PM   Specimen: Anterior Nasal Swab  Result Value Ref Range Status   SARS Coronavirus 2 by RT PCR NEGATIVE NEGATIVE Final   Influenza A by PCR NEGATIVE NEGATIVE Final   Influenza B by PCR NEGATIVE NEGATIVE Final    Comment: (NOTE) The Xpert Xpress SARS-CoV-2/FLU/RSV plus assay is intended as an aid in the diagnosis of influenza from Nasopharyngeal swab specimens and should not be used as a sole basis for treatment. Nasal washings and aspirates are unacceptable for Xpert Xpress SARS-CoV-2/FLU/RSV testing.  Fact Sheet for Patients: BloggerCourse.com  Fact Sheet for Healthcare Providers: SeriousBroker.it  This test is not yet approved or cleared by the [place] FDA and has been authorized for detection and/or diagnosis of SARS-CoV-2 by FDA under an Emergency Use Authorization (EUA). This EUA  will remain in effect (meaning this test can be used) for the duration of the COVID-19 declaration under Section 564(b)(1) of the Act, 21 U.S.C. section 360bbb-3(b)(1), unless the authorization is terminated or revoked.     Resp Syncytial Virus by PCR NEGATIVE NEGATIVE Final    Comment: (NOTE) Fact Sheet for Patients: BloggerCourse.com  Fact Sheet for Healthcare Providers: SeriousBroker.it  This test is not yet approved or cleared by the [place] FDA and has been authorized for detection and/or diagnosis of SARS-CoV-2 by FDA under an Emergency Use Authorization (EUA). This EUA will remain in effect (meaning this test can be used) for the duration of the COVID-19 declaration under Section 564(b)(1) of the Act, 21 U.S.C. section 360bbb-3(b)(1), unless the authorization is terminated or revoked.  Performed at [facility], [address], [place], [place] [address]          Radiology Studies: No results found.  Scheduled Meds:  azithromycin  500 mg Oral Daily   enoxaparin (LOVENOX) injection  30 mg Subcutaneous Q24H   feeding supplement  237 mL Oral TID BM   guaiFENesin  1,200 mg Oral BID   ipratropium-albuterol  3 mL Nebulization Q6H   methylPREDNISolone (SOLU-MEDROL) injection  80 mg Intravenous Q24H   mometasone-formoterol  2 puff Inhalation BID   multivitamin with minerals  1 tablet Oral Daily   sertraline  50 mg Oral Daily   Continuous Infusions:  cefTRIAXone (ROCEPHIN)  IV Stopped (10/10/22 1839)   magnesium sulfate bolus IVPB        Total time spent: 35 minutes    Dorcas Carrow, MD [facility] 10/11/2022, 1:46 PM

## 2022-10-12 DIAGNOSIS — J189 Pneumonia, unspecified organism: Secondary | ICD-10-CM | POA: Diagnosis not present

## 2022-10-12 DIAGNOSIS — Z7189 Other specified counseling: Secondary | ICD-10-CM | POA: Diagnosis not present

## 2022-10-12 DIAGNOSIS — Z515 Encounter for palliative care: Secondary | ICD-10-CM | POA: Diagnosis not present

## 2022-10-12 DIAGNOSIS — J441 Chronic obstructive pulmonary disease with (acute) exacerbation: Secondary | ICD-10-CM | POA: Diagnosis not present

## 2022-10-12 MED ORDER — DIPHENHYDRAMINE HCL 50 MG/ML IJ SOLN: 12.5000 mg | INTRAMUSCULAR | Status: DC | PRN

## 2022-10-12 MED ORDER — BIOTENE DRY MOUTH MT LIQD
15.0000 mL | Freq: Two times a day (BID) | OROMUCOSAL | Status: DC
  Administered 2022-10-12 – 2022-10-17 (×10): 15 mL via TOPICAL

## 2022-10-12 MED ORDER — ENSURE ENLIVE PO LIQD
237.0000 mL | ORAL | Status: DC | PRN
  Administered 2022-10-15: 237 mL via ORAL

## 2022-10-12 MED ORDER — HALOPERIDOL LACTATE 2 MG/ML PO CONC: 2.0000 mg | Freq: Four times a day (QID) | ORAL | Status: DC | PRN

## 2022-10-12 MED ORDER — HALOPERIDOL 1 MG PO TABS: 2.0000 mg | ORAL_TABLET | Freq: Four times a day (QID) | ORAL | Status: DC | PRN

## 2022-10-12 MED ORDER — LORAZEPAM 1 MG PO TABS
1.0000 mg | ORAL_TABLET | ORAL | Status: DC | PRN
  Administered 2022-10-13: 1 mg via ORAL
  Filled 2022-10-12 (×2): qty 1

## 2022-10-12 MED ORDER — POLYVINYL ALCOHOL 1.4 % OP SOLN: 1.0000 [drp] | Freq: Four times a day (QID) | OPHTHALMIC | Status: DC | PRN

## 2022-10-12 MED ORDER — MORPHINE SULFATE (PF) 2 MG/ML IV SOLN: 2.0000 mg | INTRAVENOUS | Status: DC | PRN

## 2022-10-12 MED ORDER — HALOPERIDOL LACTATE 5 MG/ML IJ SOLN: 2.0000 mg | Freq: Four times a day (QID) | INTRAMUSCULAR | Status: DC | PRN

## 2022-10-12 MED ORDER — GLYCOPYRROLATE 1 MG PO TABS: 1.0000 mg | ORAL_TABLET | ORAL | Status: DC | PRN

## 2022-10-12 MED ORDER — LORAZEPAM 2 MG/ML PO CONC: 1.0000 mg | ORAL | Status: DC | PRN

## 2022-10-12 MED ORDER — GLYCOPYRROLATE 0.2 MG/ML IJ SOLN: 0.2000 mg | INTRAMUSCULAR | Status: DC | PRN

## 2022-10-12 NOTE — Progress Notes (Signed)
PROGRESS NOTE    Jaime Cline  ZOX:[id number] DOB: 03-Jun-1961 DOA: 10/08/2022 PCP: Patient, [name]   Brief Narrative:   62 y.o. 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  On presentation, he initially needed 10 L oxygen via nasal cannula. Chest x-ray showed possible new patchy right basilar opacity, suspicious for pneumonia. He was started on Rocephin, azithromycin and also IV Solu-Medrol was given.  Remains in poor clinical status. Shared decision making 4/25, converting to comfort care and pursuing hospice.  Assessment & Plan:   COPD with acute exacerbation, possible community-acquired right basilar bacterial pneumonia Acute on chronic respiratory failure with hypoxia Ongoing smoker. Severe malnutrition/failure to thrive  -Patient presented with worsening dyspnea and was found to be tachypneic, tachycardic and required 10 L oxygen via nasal cannula initially in the ED.  Normally uses 2 to 3 L oxygen via nasal cannula.   Continues to remain symptomatic.  -Chest x-ray showed possible right basilar pneumonia.  Currently on Rocephin and Zithromax.  Solu-Medrol to 80 mg IV daily.  Continue nebs and Dulera. COVID-19/RSV/influenza testing negative.  Respiratory virus panel positive for rhinovirus. -Counseled regarding tobacco cessation -Overall poor prognosis.  See goal of care discussion below.   Hypomagnesemia -Replaced.  Normocytic anemia -Questionable cause.  Hemoglobin currently stable.  Monitor intermittently.   Depression -Continue sertraline and as needed Xanax.  Apparently patient has not been taking any medications for the last 2 weeks as he ran out of them.  Goal of care discussion: I had detailed discussion  with patient in the morning rounds about his severity of illness, not improving appropriately.  Patient tells me that he has had enough ambulance rides.  He is ready to die peacefully.  Discussed about hospice options at home versus inpatient hospice.  Patient is more interested in inpatient hospice as he does not have adequate support system at home. Called and updated with palliative care team. Patient ultimately converted to comfort care measures in house, he will be referred to inpatient hospice. All end-of-life care and symptomatic support available.   DVT prophylaxis: Comfort care Code Status: Comfort care Family Communication: None at bedside.  Palliative care discussed with patient's brother. Disposition Plan: Status is: Severe cystometric disease  Consultants: Palliative care  Procedures: None  Antimicrobials: Rocephin and Zithromax from 10/08/2022 onwards   Subjective:  Patient seen and examined.  See goal of care discussion above.  He does not feel well.   Objective: Vitals:   10/12/22 0428 10/12/22 0434 10/12/22 0840 10/12/22 0856  BP: 113/83   119/87  Pulse: 97   (!) 103  Resp: 20   18  Temp: 98 F (36.7 C)     TempSrc:      SpO2: 100%  100% 100%  Weight:  45 kg      Intake/Output Summary (Last 24 hours) at 10/12/2022 1334 Last data filed at 10/12/2022 1113 Gross per 24 hour  Intake 750 ml  Output 2400 ml  Net -1650 ml    Filed Weights   10/10/22 1811 10/11/22 0318 10/12/22 0434  Weight: 45.8 kg 44.1 kg 45 kg    Examination:  General: Sick looking.  Frail and debilitated.  Cachectic.  Mildly dyspneic on conversation. Cardiovascular: S1-S2 normal regular rate rhythm. Respiratory: Bilateral diffuse expiratory wheezes.  Not in any distress at rest.  Slightly tachypneic on communication. Gastrointestinal: Soft.  Nontender.  Bowel sound present. Ext: No edema or swelling. Neuro: Alert and awake.  Oriented.     Data Reviewed: I have personally reviewed  following labs and imaging studies  CBC: Recent Labs  Lab 10/07/22 0815 10/08/22 1400 10/09/22 0048  WBC 8.3 5.2 4.0  NEUTROABS 6.1 3.4  --   HGB 13.8 12.8* 12.1*  HCT 41.6 38.9* 35.8*  MCV 99.8 100.0 98.1  PLT 180 203 205    Basic Metabolic Panel: Recent Labs  Lab 10/07/22 0815 10/08/22 1400 10/09/22 0048  NA 133* 133* 133*  K 3.3* 3.5 3.6  CL 98 99 96*  CO2 GLUCOSE 107* 95 166*  BUN CREATININE 0.78 0.69 0.83  CALCIUM 8.9 9.1 9.0  MG  --   --  1.6*    GFR: Estimated Creatinine Clearance: 59.5 mL/min (by C-G formula based on SCr of 0.83 mg/dL). Liver Function Tests: Recent Labs  Lab 10/09/22 0048  AST 26  ALT 18  ALKPHOS 47  BILITOT 0.6  PROT 6.7  ALBUMIN 3.2*    No results for input(s): "LIPASE", "AMYLASE" in the last 168 hours. No results for input(s): "AMMONIA" in the last 168 hours. Coagulation Profile: No results for input(s): "INR", "PROTIME" in the last 168 hours. Cardiac Enzymes: No results for input(s): "CKTOTAL", "CKMB", "CKMBINDEX", "TROPONINI" in the last 168 hours. BNP (last 3 results) No results for input(s): "PROBNP" in the last 8760 hours. HbA1C: No results for input(s): "HGBA1C" in the last 72 hours. CBG: No results for input(s): "GLUCAP" in the last 168 hours. Lipid Profile: No results for input(s): "CHOL", "HDL", "LDLCALC", "TRIG", "CHOLHDL", "LDLDIRECT" in the last 72 hours. Thyroid Function Tests: No results for input(s): "TSH", "T4TOTAL", "FREET4", "T3FREE", "THYROIDAB" in the last 72 hours. Anemia Panel: No results for input(s): "VITAMINB12", "FOLATE", "FERRITIN", "TIBC", "IRON", "RETICCTPCT" in the last 72 hours. Sepsis Labs: Recent Labs  Lab 10/10/22 0224  PROCALCITON 0.12     Recent Results (from the past 240 hour(s))  SARS Coronavirus 2 by RT PCR (hospital order, performed in [facility] hospital lab) *cepheid single result test* Anterior Nasal Swab     Status: None   Collection Time: 10/07/22   8:15 AM   Specimen: Anterior Nasal Swab  Result Value Ref Range Status   SARS Coronavirus 2 by RT PCR NEGATIVE NEGATIVE Final    Comment: (NOTE) SARS-CoV-2 target nucleic acids are NOT DETECTED.  The SARS-CoV-2 RNA is generally detectable in upper and lower respiratory specimens during the acute phase of infection. The lowest concentration of SARS-CoV-2 viral copies this assay can detect is 250 copies / mL. A negative result does not preclude SARS-CoV-2 infection and should not be used as the sole basis for treatment or other patient management decisions.  A negative result may occur with improper specimen collection / handling, submission of specimen other than nasopharyngeal swab, presence of viral mutation(s) within the areas targeted by this assay, and inadequate number of viral copies (<250 copies / mL). A negative result must be combined with clinical observations, patient history, and epidemiological information.  Fact Sheet for Patients:   RoadLapTop.co.za  Fact Sheet for Healthcare Providers: http://kim-miller.com/  This test is not yet approved or  cleared by the [place] FDA and has been authorized for detection and/or diagnosis of SARS-CoV-2 by FDA under an Emergency Use Authorization (EUA).  This EUA will remain in  effect (meaning this test can be used) for the duration of the COVID-19 declaration under Section 564(b)(1) of the Act, 21 U.S.C. section 360bbb-3(b)(1), unless the authorization is terminated or revoked sooner.  Performed at [facility], [address], [place], [place] [address]   Respiratory (~20 pathogens) panel by PCR     Status: Abnormal   Collection Time: 10/08/22  5:48 PM   Specimen: Nasopharyngeal Swab; Respiratory  Result Value Ref Range Status   Adenovirus NOT DETECTED NOT DETECTED Final   Coronavirus 229E NOT DETECTED NOT DETECTED Final    Comment: (NOTE) The Coronavirus  on the Respiratory Panel, DOES NOT test for the novel  Coronavirus (2019 nCoV)    Coronavirus HKU1 NOT DETECTED NOT DETECTED Final   Coronavirus NL63 NOT DETECTED NOT DETECTED Final   Coronavirus OC43 NOT DETECTED NOT DETECTED Final   Metapneumovirus NOT DETECTED NOT DETECTED Final   Rhinovirus / Enterovirus DETECTED (A) NOT DETECTED Final   Influenza A NOT DETECTED NOT DETECTED Final   Influenza B NOT DETECTED NOT DETECTED Final   Parainfluenza Virus 1 NOT DETECTED NOT DETECTED Final   Parainfluenza Virus 2 NOT DETECTED NOT DETECTED Final   Parainfluenza Virus 3 NOT DETECTED NOT DETECTED Final   Parainfluenza Virus 4 NOT DETECTED NOT DETECTED Final   Respiratory Syncytial Virus NOT DETECTED NOT DETECTED Final   Bordetella pertussis NOT DETECTED NOT DETECTED Final   Bordetella Parapertussis NOT DETECTED NOT DETECTED Final   Chlamydophila pneumoniae NOT DETECTED NOT DETECTED Final   Mycoplasma pneumoniae NOT DETECTED NOT DETECTED Final    Comment: Performed at [facility], [address], [place], [place] [address]  Resp panel by RT-PCR (RSV, Flu A&B, Covid) Anterior Nasal Swab     Status: None   Collection Time: 10/08/22  5:48 PM   Specimen: Anterior Nasal Swab  Result Value Ref Range Status   SARS Coronavirus 2 by RT PCR NEGATIVE NEGATIVE Final   Influenza A by PCR NEGATIVE NEGATIVE Final   Influenza B by PCR NEGATIVE NEGATIVE Final    Comment: (NOTE) The Xpert Xpress SARS-CoV-2/FLU/RSV plus assay is intended as an aid in the diagnosis of influenza from Nasopharyngeal swab specimens and should not be used as a sole basis for treatment. Nasal washings and aspirates are unacceptable for Xpert Xpress SARS-CoV-2/FLU/RSV testing.  Fact Sheet for Patients: BloggerCourse.com  Fact Sheet for Healthcare Providers: SeriousBroker.it  This test is not yet approved or cleared by the [place] FDA and has been authorized for  detection and/or diagnosis of SARS-CoV-2 by FDA under an Emergency Use Authorization (EUA). This EUA will remain in effect (meaning this test can be used) for the duration of the COVID-19 declaration under Section 564(b)(1) of the Act, 21 U.S.C. section 360bbb-3(b)(1), unless the authorization is terminated or revoked.     Resp Syncytial Virus by PCR NEGATIVE NEGATIVE Final    Comment: (NOTE) Fact Sheet for Patients: BloggerCourse.com  Fact Sheet for Healthcare Providers: SeriousBroker.it  This test is not yet approved or cleared by the [place] FDA and has been authorized for detection and/or diagnosis of SARS-CoV-2 by FDA under an Emergency Use Authorization (EUA). This EUA will remain in effect (meaning this test can be used) for the duration of the COVID-19 declaration under Section 564(b)(1) of the Act, 21 U.S.C. section 360bbb-3(b)(1), unless the authorization is terminated or revoked.  Performed at [facility], [address] [place], [place] [address]  Radiology Studies: No results found.      Scheduled Meds:  antiseptic oral rinse  15 mL Topical BID   guaiFENesin  1,200 mg Oral BID   ipratropium-albuterol  3 mL Nebulization Q6H   methylPREDNISolone (SOLU-MEDROL) injection  80 mg Intravenous Q24H   mometasone-formoterol  2 puff Inhalation BID   sertraline  50 mg Oral Daily   Continuous Infusions:  cefTRIAXone (ROCEPHIN)  IV 2 g (10/11/22 1738)      Total time spent: 35 minutes    Dorcas Carrow, MD [facility] 10/12/2022, 1:34 PM

## 2022-10-12 NOTE — Progress Notes (Addendum)
Daily Progress Note   Patient Name: Jaime Cline       Date: 10/12/2022 DOB: September 17, 1960  Age: 62 y.o. MRN#: [id number] Attending Physician: Dorcas Carrow, MD Primary Care Physician: Patient, [name] Admit Date: 10/08/2022  Reason for Consultation/Follow-up: Establishing goals of care  Subjective: I have reviewed medical records including EPIC notes, MAR, and labs. Discussed case with Dr. Jerral Ralph - patient interested in [facility] vs home hospice - [facility] also aware and will touch base with patient and his family. Received report from primary RN - no acute concerns. RN reports patient is refusing to wear [place].   Went to visit patient at bedside - no family/visitors present. Patient was lying in bed awake, alert, oriented, and able to participate in conversation. He endorses feeling "not too well; weak and tired." No signs or non-verbal gestures of pain or discomfort noted. No respiratory distress or increased work of breathing. Congested cough noted. Patient has pulled oxygen extention tubing apart and intermittently puts tubing in his mouth - he refuses to wear [place] in nose. Education provided - he wishes to continue to take oxygen intermittently by mouth. Noted breakfast tray at bedside - 25% eaten.  Patient confirms goal for residential hospice placement at [facility]; also open to home hospice but understands he would need additional support - TOC to touch base with patient and family.  We talked about transition to comfort measures in house and what that would entail inclusive of medications to control pain, dyspnea, agitation, nausea, and itching. We discussed stopping all unnecessary measures such as blood draws, needle sticks, oxygen, antibiotics, CBGs/insulin, cardiac monitoring, IVF, and  frequent vital signs. Education provided that other non-pharmacological interventions would be utilized for holistic support and comfort such as spiritual support if requested, repositioning, music therapy, offering comfort feeds, and/or therapeutic listening. All care would focus on how the patient is looking and feeling. Patient opts for transition to full comfort measures today.  All questions and concerns addressed. Encouraged to call with questions and/or concerns. PMT card previously provided.  11:00 AM Called patient's brother/Jaime Cline. Jaime Cline was aware patient was in the hospital; however, he has not been able to connect with any staff for updates regarding patient's status. Reviewed patient's interval history since admission. Education provided that COPD is a non-curable, progressive disease for which  patient has reached end stages. Updated him on patient's decision for transition to full comfort measures today and goal for hospice transfer. Therapeutic listening provided as Jaime Cline tells me about patient living with him for 10 years and being aware of "how sick he was." He describes frequent ambulances at their home. Patient moved back to [facility] in January 2023. Jaime Cline expressed understanding of patient's choices and supports his wishes. He is appreciative for the phone call and updates.   All questions and concerns addressed. Encouraged to call with questions and/or concerns. PMT number provided as well as phone number for 5N nurses station.  Length of Stay: 3  Current Medications: Scheduled Meds:   enoxaparin (LOVENOX) injection  30 mg Subcutaneous Q24H   feeding supplement  237 mL Oral TID BM   guaiFENesin  1,200 mg Oral BID   ipratropium-albuterol  3 mL Nebulization Q6H   methylPREDNISolone (SOLU-MEDROL) injection  80 mg Intravenous Q24H   mometasone-formoterol  2 puff Inhalation BID   multivitamin with minerals  1 tablet Oral Daily   sertraline  50 mg Oral Daily    Continuous  Infusions:  cefTRIAXone (ROCEPHIN)  IV 2 g (10/11/22 1738)    PRN Meds: acetaminophen **OR** acetaminophen, albuterol, ALPRAZolam, ondansetron **OR** ondansetron (ZOFRAN) IV, traZODone  Physical Exam Vitals and nursing note reviewed.  Constitutional:      General: He is not in acute distress.    Appearance: He is ill-appearing.  Pulmonary:     Effort: No respiratory distress.  Skin:    General: Skin is warm and dry.  Neurological:     Mental Status: He is alert and oriented to person, place, and time.     Motor: Weakness present.  Psychiatric:        Attention and Perception: Attention normal.        Behavior: Behavior is cooperative.        Cognition and Memory: Cognition and memory normal.             Vital Signs: BP 119/87 (BP Location: Right Arm)   Pulse (!) 103   Temp 98 F (36.7 C)   Resp 18   Wt 45 kg   SpO2 100%   BMI 14.23 kg/m  SpO2: SpO2: 100 % O2 Device: O2 Device: Room Air O2 Flow Rate: O2 Flow Rate (L/min): 4 L/min  Intake/output summary:  Intake/Output Summary (Last 24 hours) at 10/12/2022 1016 Last data filed at 10/12/2022 4098 Gross per 24 hour  Intake 870 ml  Output 1850 ml  Net -980 ml   LBM: Last BM Date : 10/10/22 Baseline Weight: Weight: 45.8 kg Cline recent weight: Weight: 45 kg       Palliative Assessment/Data: PPS 40%      Patient Active Problem List   Diagnosis Date Noted   AKI (acute kidney injury) 08/18/2022   Metabolic acidosis, increased anion gap 08/18/2022   Depression 08/18/2022   Dyspepsia 08/18/2022   Sepsis due to pneumonia 08/17/2022   Dyspnea 04/12/2022   Palliative care encounter 04/11/2022   Goals of care, counseling/discussion 04/11/2022   Counseling and coordination of care 04/11/2022   COPD with acute exacerbation 04/10/2022   Syncope 09/28/2021   Protein-calorie malnutrition, severe 09/27/2021   Hyponatremia    Hypotension due to hypovolemia    Syncope and collapse 09/26/2021   CAP (community acquired  pneumonia)    COPD exacerbation 08/27/2021   Asthma, chronic 12/31/2012   Smoker 12/31/2012   Knee pain, bilateral 12/31/2012   Iron deficiency  anemia 12/31/2012   Knee pain, chronic 12/31/2012    Palliative Care Assessment & Plan   Patient Profile: 62 y.o. male  with past medical history of COPD on home oxygen via nasal cannula 2 to 3 L/min, chronic tobacco use, alcohol abuse, hypertension, chronic back pain, malnutrition, recent admission from 08/17/2022-08/20/2022 for COPD exacerbation and pneumonia,  admitted on 10/08/2022 with worsening shortness of breath.    Patient was also seen in the ED yesterday for dyspnea but sent home.  Had not taken home medications for about 2 weeks.  Patient currently admitted for possible right basilar bacterial pneumonia, COPD exacerbation acute on chronic respiratory failure with hypoxia, failure to thrive. PMT has been consulted to assist with goals of care conversation and discussion of the option of home hospice.  Assessment: Principal Problem:   COPD exacerbation   Terminal care  Recommendations/Plan: Initiated full comfort measures Continue DNR/DNI as previously documented Patient interested in [facility] transfer vs home hospice - TOC notified and consult placed Added orders for EOL symptom management and to reflect full comfort measures, as well as discontinued orders that were not focused on comfort Unrestricted visitation orders were placed per current [place] EOL visitation policy  Nursing to provide frequent assessments and administer PRN medications as clinically necessary to ensure EOL comfort PMT will continue to follow and support holistically  Symptom Management Morphine PRN pain/dyspnea/increased work of breathing/RR>25 Tylenol PRN pain/fever Biotin twice daily Benadryl PRN itching Robinul PRN secretions Haldol PRN agitation/delirium Ativan PRN anxiety/seizure/sleep/distress Zofran PRN nausea/vomiting Liquifilm Tears PRN  dry eye Continue rocephin, albuterol, duoneb, solumedrol, dulera, guaifenesin, zoloft, trazodone for comfort    Goals of Care and Additional Recommendations: Limitations on Scope of Treatment: Full Comfort Care  Code Status:    Code Status Orders  (From admission, onward)           Start     Ordered   10/08/22 1715  Do not attempt resuscitation (DNR)  Continuous       Question Answer Comment  If patient has no pulse and is not breathing Do Not Attempt Resuscitation   If patient has a pulse and/or is breathing: Medical Treatment Goals LIMITED ADDITIONAL INTERVENTIONS: Use medication/IV fluids and cardiac monitoring as indicated; Do not use intubation or mechanical ventilation (DNI), also provide comfort medications.  Transfer to Progressive/Stepdown as indicated, avoid Intensive Care.   Consent: Discussion documented in EHR or advanced directives reviewed      10/08/22 1718           Code Status History     Date Active Date Inactive Code Status Order ID Comments User Context   08/17/2022 1342 08/20/2022 2329 DNR [id number]  Steffanie Rainwater, MD ED   04/11/2022 0955 04/20/2022 0006 DNR [id number]  Alena Bills, DO Inpatient   04/10/2022 2200 04/11/2022 0954 Full Code [id number]  Dorcas Carrow, MD Inpatient   01/24/2022 2347 01/27/2022 2033 Full Code [id number]  Eduard Clos, MD ED   09/26/2021 0437 10/03/2021 2043 Full Code [id number]  Marolyn Haller, MD ED   08/27/2021 1447 08/29/2021 1948 Full Code [id number]  Steffanie Rainwater, MD ED   03/15/2012 0317 03/15/2012 1159 Full Code [id number]  Schinlever, Arie Sabina, PA ED       Prognosis:  < 2 weeks if not wearing oxygen  Discharge Planning: Hospice facility  Care plan was discussed with primary RN, patient, patient's brother, Dr. Jerral Ralph, [facility]  Thank you for allowing the Palliative Medicine Team to assist in the  care of this patient.  Haskel Khan, NP  Please contact Palliative Medicine Team phone at [phone]  for questions and concerns.   *Portions of this note are a verbal dictation therefore any spelling and/or grammatical errors are due to the "Dragon Medical One" system interpretation.

## 2022-10-12 NOTE — TOC Initial Note (Signed)
Transition of Care [facility]) - Initial/Assessment Note    Patient Details  Name: Jaime Cline MRN: [id number] Date of Birth: [birth year]  Transition of Care [facility]) CM/SW Contact:    Tom-Johnson, Hershal Coria, RN Phone Number: 10/12/2022, 2:43 PM  Clinical Narrative:                  CM consulted for Residential Hospice v/s Home with Hospice. Patient transitioned to full comfort care today. CM called in referral to [facility] per patient's request. Shanita to see patient and eval. Awaiting approval. CM will continue to follow and render compassionate support.        Expected Discharge Plan: Hospice Medical Facility Barriers to Discharge: Other (must enter comment) (Transitioned to full comfort care today. Residential hospice referral sent, awaiting approval.)   Patient Goals and CMS Choice Patient states their goals for this hospitalization and ongoing recovery are:: To go to [facility], Residential hospice. CMS Medicare.gov Compare Post Acute Care list provided to:: Patient Choice offered to / list presented to : Patient, Sibling (Brother, Charles)      Expected Discharge Plan and Services In-house Referral: Hospice / Palliative Care Discharge Planning Services: CM Consult Post Acute Care Choice: Hospice Living arrangements for the past 2 months: Apartment                 DME Arranged: N/A DME Agency: NA       HH Arranged: NA HH Agency: NA        Prior Living Arrangements/Services Living arrangements for the past 2 months: Apartment Lives with:: Siblings (Brother, Hydrologist) Patient language and need for interpreter reviewed:: Yes Do you feel safe going back to the place where you live?: Yes      Need for Family Participation in Patient Care: Yes (Comment) Care giver support system in place?: Yes (comment)   Criminal Activity/Legal Involvement Pertinent to Current Situation/Hospitalization: No - Comment as needed  Activities of Daily Living   ADL Screening  (condition at time of admission) Is the patient deaf or have difficulty hearing?: No Does the patient have difficulty seeing, even when wearing glasses/contacts?: No Does the patient have difficulty concentrating, remembering, or making decisions?: No Does the patient have difficulty dressing or bathing?: No Does the patient have difficulty walking or climbing stairs?: Yes  Permission Sought/Granted Permission sought to share information with : Case Manager, Magazine features editor, Family Supports Permission granted to share information with : Yes, Verbal Permission Granted  Share Information with NAME: Dionicio Stall  Permission granted to share info w AGENCY: [facility]  Permission granted to share info w Relationship: [employer], Residential Hospice.     Emotional Assessment Appearance:: Appears stated age Attitude/Demeanor/Rapport: Engaged, Gracious Affect (typically observed): Accepting, Calm, Hopeful, Pleasant Orientation: : Oriented to Self, Oriented to Place, Oriented to  Time, Oriented to Situation Alcohol / Substance Use: Not Applicable Psych Involvement: No (comment)  Admission diagnosis:  COPD exacerbation [J44.1] Community acquired pneumonia of right lower lobe of lung [J18.9] Patient Active Problem List   Diagnosis Date Noted   AKI (acute kidney injury) 08/18/2022   Metabolic acidosis, increased anion gap 08/18/2022   Depression 08/18/2022   Dyspepsia 08/18/2022   Sepsis due to pneumonia 08/17/2022   Dyspnea 04/12/2022   Palliative care encounter 04/11/2022   Goals of care, counseling/discussion 04/11/2022   Counseling and coordination of care 04/11/2022   COPD with acute exacerbation 04/10/2022   Syncope 09/28/2021   Protein-calorie malnutrition, severe 09/27/2021   Hyponatremia  Hypotension due to hypovolemia    Syncope and collapse 09/26/2021   CAP (community acquired pneumonia)    COPD exacerbation 08/27/2021   Asthma, chronic 12/31/2012    Smoker 12/31/2012   Knee pain, bilateral 12/31/2012   Iron deficiency anemia 12/31/2012   Knee pain, chronic 12/31/2012   PCP:  Patient, [name] Pharmacy:   [facility], [place] - [address] [place] [address] Phone: [phone] Fax: [phone]  [id number] - [address], [place] - [address] [place] [address] Phone: [phone] Fax: [phone]  [id number] [place], [place] - [address] AT [facility] [address] [place] [address] Phone: [phone] Fax: [phone]  [facility] [address] [place] [address] Phone: [phone] Fax: [phone]     Social Determinants of Health (SDOH) Social History: SDOH Screenings   Food Insecurity: No Food Insecurity (10/09/2022)  Housing: Low Risk  (10/09/2022)  Transportation Needs: No Transportation Needs (10/09/2022)  Utilities: Not At Risk (10/09/2022)  Tobacco Use: High Risk (10/07/2022)   SDOH Interventions:     Readmission Risk Interventions    04/12/2022    4:02 PM 01/27/2022   12:27 PM  Readmission Risk Prevention Plan  Transportation Screening Complete Complete  PCP or Specialist Appt within 5-7 Days Complete Not Complete  Not Complete comments  new PCP made for 8/28  Home Care Screening Complete Complete  Medication Review (RN CM) Complete Complete

## 2022-10-12 NOTE — Plan of Care (Signed)
  Problem: Respiratory: Goal: Ability to maintain adequate ventilation will improve Outcome: Progressing Goal: Ability to maintain a clear airway will improve Outcome: Progressing   Problem: Clinical Measurements: Goal: Will remain free from infection Outcome: Progressing Goal: Diagnostic test results will improve Outcome: Progressing Goal: Cardiovascular complication will be avoided Outcome: Progressing   Problem: Nutrition: Goal: Adequate nutrition will be maintained Outcome: Progressing   Problem: Coping: Goal: Level of anxiety will decrease Outcome: Progressing   Problem: Pain Managment: Goal: General experience of comfort will improve Outcome: Progressing   Problem: Safety: Goal: Ability to remain free from injury will improve Outcome: Progressing   Problem: Skin Integrity: Goal: Risk for impaired skin integrity will decrease Outcome: Progressing

## 2022-10-12 NOTE — Progress Notes (Addendum)
Civil engineer, contracting [facility]) Hospital Liaison Note  Referral received for patient/family interest in [facility]. Chart under review by [facility] physician.   At this time patient is not IPU appropriate. We are happy to follow at LTC or home with hospice.   Please call with any questions or concerns. Thank you  Dionicio Stall, [facility] Liaison [phone]

## 2022-10-13 DIAGNOSIS — F419 Anxiety disorder, unspecified: Secondary | ICD-10-CM

## 2022-10-13 DIAGNOSIS — J441 Chronic obstructive pulmonary disease with (acute) exacerbation: Secondary | ICD-10-CM | POA: Diagnosis not present

## 2022-10-13 DIAGNOSIS — J189 Pneumonia, unspecified organism: Secondary | ICD-10-CM | POA: Diagnosis not present

## 2022-10-13 DIAGNOSIS — Z515 Encounter for palliative care: Secondary | ICD-10-CM | POA: Diagnosis not present

## 2022-10-13 DIAGNOSIS — Z7189 Other specified counseling: Secondary | ICD-10-CM | POA: Diagnosis not present

## 2022-10-13 NOTE — TOC Progression Note (Signed)
Transition of Care [facility]) - Progression Note    Patient Details  Name: Jaime Cline MRN: [id number] Date of Birth: 14-Jan-1961  Transition [facility]) CM/SW Contact  Huston Foley Jacklynn Ganong, RN Phone Number: 10/13/2022, 11:35 AM  Clinical Narrative:     Case manager spoke with patient concerning recommendation for Home Health. Patient has no preference. Referral was called to [facility], He will follow up on Monday. Patient asked for assistance with getting smaller portable tanks to be able to go on his porch and outdoors. CM contacted Barbara Cower with [employer], He will have someone contact patient when he returns home. TOC Team will continue to follow.  Expected Discharge Plan: Home w Home Health Services Barriers to Discharge: Continued Medical Work up  Expected Discharge Plan and Services In-house Referral: Hospice / Palliative Care Discharge Planning Services: CM Consult Post Acute Care Choice: Home Health Living arrangements for the past 2 months: Apartment                 DME Arranged: N/A DME Agency: NA       HH Arranged: PT HH Agency: [facility] Date HH Agency Contacted: 10/13/22 Time HH Agency Contacted: 1120 Representative spoke with at [facility] Agency: Ephriam Knuckles   Social Determinants of Health (SDOH) Interventions SDOH Screenings   Food Insecurity: No Food Insecurity (10/09/2022)  Housing: Low Risk  (10/09/2022)  Transportation Needs: No Transportation Needs (10/09/2022)  Utilities: Not At Risk (10/09/2022)  Tobacco Use: High Risk (10/07/2022)    Readmission Risk Interventions    04/12/2022    4:02 PM 01/27/2022   12:27 PM  Readmission Risk Prevention Plan  Transportation Screening Complete Complete  PCP or Specialist Appt within 5-7 Days Complete Not Complete  Not Complete comments  new PCP made for 8/28  Home Care Screening Complete Complete  Medication Review (RN CM) Complete Complete

## 2022-10-13 NOTE — TOC Progression Note (Signed)
Transition of Care [facility]) - Progression Note    Patient Details  Name: Jaime Cline MRN: [id number] Date of Birth: 01/28/1961  Transition of Care [facility]) CM/SW Contact  Huston Foley Jacklynn Ganong, RN Phone Number: 10/13/2022, 3:50 PM  Clinical Narrative:    Case Manager spoke with patient concerning discharge plans. He remains concerned about getting smaller portable oxygen tank, CM explained that [employer] will contact him once he is home. Patient states that he will be  going back home with his son and son's mother. Says he will have some assistance but not a lot. Patient's phone number is [phone].  His son's number is [phone]. CM also spoke with Ola Spurr [phone], a relative that lives in [place] he says patient could live with him if he has no there place to go, but we would need to transport him to [facility]. Patient has not expressed desire to go there. CM has spoken with and updated Amber with Palliative Care. TOC Team will continue to follow.   Expected Discharge Plan: Home w Home Health Services Barriers to Discharge: Continued Medical Work up  Expected Discharge Plan and Services In-house Referral: Hospice / Palliative Care Discharge Planning Services: CM Consult Post Acute Care Choice: Home Health Living arrangements for the past 2 months: Apartment                 DME Arranged: N/A DME Agency: NA       HH Arranged: PT HH Agency: [facility] Date HH Agency Contacted: 10/13/22 Time HH Agency Contacted: 1120 Representative spoke with at [facility] Agency: Ephriam Knuckles   Social Determinants of Health (SDOH) Interventions SDOH Screenings   Food Insecurity: No Food Insecurity (10/09/2022)  Housing: Low Risk  (10/09/2022)  Transportation Needs: No Transportation Needs (10/09/2022)  Utilities: Not At Risk (10/09/2022)  Tobacco Use: High Risk (10/07/2022)    Readmission Risk Interventions    04/12/2022    4:02 PM 01/27/2022   12:27 PM  Readmission Risk  Prevention Plan  Transportation Screening Complete Complete  PCP or Specialist Appt within 5-7 Days Complete Not Complete  Not Complete comments  new PCP made for 8/28  Home Care Screening Complete Complete  Medication Review (RN CM) Complete Complete

## 2022-10-13 NOTE — Progress Notes (Signed)
Daily Progress Note   Patient Name: Jaime Cline       Date: 10/13/2022 DOB: [birth year]  Age: 62 y.o. MRN#: [id number] Attending Physician: Dorcas Carrow, MD Primary Care Physician: Patient, [name] Admit Date: 10/08/2022  Reason for Consultation/Follow-up: Non pain symptom management, Pain control, Psychosocial/spiritual support, and Terminal Care  Subjective:  10:05 AM I have reviewed medical records including EPIC notes, MAR, and labs.  Patient not approved for residential hospice. Received report from primary RN - no acute concerns.  Went to visit patient at bedside -no family/visitors present.  Patient was lying in bed awake, alert, oriented, and able to participate in conversation. No signs or non-verbal gestures of pain or discomfort noted. No respiratory distress, increased work of breathing, or secretions noted.  He denies shortness of breath even though he is wearing 4L O2 nasal cannula in his mouth.  Patient does endorse that he did not sleep well last night and all he wants to do his "go to sleep."  Question symptoms further and he does endorse feeling anxious -will have RN provide dose of Ativan for anxiety.  Patient is not up for much discussion today though he remains very pleasant.  He is agreeable to home with hospice and understands that he would need 24/7 supervision/assistance -he will try to coordinate with family.  Requested RN provide dose of Ativan.  2:24 PM Called brother/Charles -emotional support provided.  Provided updates per RN and my assessment today.  Reviewed that patient was not approved for residential hospice placement and would be seeking discharge home with family members and hospice support.  Leonette Most tells me that he lives in [place] and would be able to  care for patient if it was a last resort as it would be difficult to navigate from living out of state.  Therapeutic listening provided as he reflects on family dynamics.  Leonette Most was able to call patient's son/Junior last night to provide him with updates on patient's current situation.  Junior had planned on visiting patient in the hospital last night and today.  Prior to hospitalization, patient was living with his son's aunt.  Phone numbers provided for Junior: [phone] and [phone].  Leonette Most does not know patient has a niece named Arboriculturist.  2:38 PM Attempted to call son Junior -first number was not in service.  Second phone number went to voicemail - left voicemail with PMT phone number and request to return call.  2:54 PM Charles called PMT back with information regarding patient's uncle who lives in [place] - uncle/Michael Glenda Chroman indicates if necessary patient can come live with him.  Phone number is [phone].  Provided contact information for patient's son/Junior and uncle/Michael Braddy to [facility] who will further explore disposition options with patient and family.  [facility] continues to follow patient for disposition plan.  Length of Stay: 4  Current Medications: Scheduled Meds:   antiseptic oral rinse  15 mL Topical BID   guaiFENesin  1,200 mg Oral BID   ipratropium-albuterol  3 mL Nebulization Q6H   methylPREDNISolone (SOLU-MEDROL) injection  80 mg Intravenous Q24H   mometasone-formoterol  2 puff Inhalation BID   sertraline  50 mg Oral Daily    Continuous Infusions:  cefTRIAXone (ROCEPHIN)  IV 2 g (10/12/22 1829)    PRN Meds: acetaminophen **OR** acetaminophen, albuterol, diphenhydrAMINE, feeding supplement, glycopyrrolate **OR** glycopyrrolate **OR** glycopyrrolate, haloperidol **OR** haloperidol **OR** haloperidol lactate, LORazepam **OR** LORazepam, morphine injection, ondansetron **OR** ondansetron (ZOFRAN) IV, polyvinyl alcohol, traZODone  Physical  Exam Vitals and nursing note reviewed.  Constitutional:      General: He is not in acute distress.    Appearance: He is ill-appearing.  Pulmonary:     Effort: No respiratory distress.  Skin:    General: Skin is warm and dry.  Neurological:     Mental Status: He is alert and oriented to person, place, and time.     Motor: Weakness present.  Psychiatric:        Attention and Perception: Attention normal.        Behavior: Behavior is cooperative.        Cognition and Memory: Cognition and memory normal.             Vital Signs: BP (!) 130/96 (BP Location: Right Arm)   Pulse 96   Temp 98 F (36.7 C) (Oral)   Resp 18   Wt 45 kg   SpO2 98%   BMI 14.23 kg/m  SpO2: SpO2: 98 % O2 Device: O2 Device: Nasal Cannula O2 Flow Rate: O2 Flow Rate (L/min): 4 L/min  Intake/output summary:  Intake/Output Summary (Last 24 hours) at 10/13/2022 0956 Last data filed at 10/13/2022 0700 Gross per 24 hour  Intake --  Output 1900 ml  Net -1900 ml   LBM: Last BM Date : 10/13/22 Baseline Weight: Weight: 45.8 kg Most recent weight: Weight: 45 kg       Palliative Assessment/Data: 40%      Patient Active Problem List   Diagnosis Date Noted   AKI (acute kidney injury) (HCC) 08/18/2022   Metabolic acidosis, increased anion gap 08/18/2022   Depression 08/18/2022   Dyspepsia 08/18/2022   Sepsis due to pneumonia (HCC) 08/17/2022   Dyspnea 04/12/2022   Palliative care encounter 04/11/2022   Goals of care, counseling/discussion 04/11/2022   Counseling and coordination of care 04/11/2022   COPD with acute exacerbation (HCC) 04/10/2022   Syncope 09/28/2021   Protein-calorie malnutrition, severe 09/27/2021   Hyponatremia    Hypotension due to hypovolemia    Syncope and collapse 09/26/2021   CAP (community acquired pneumonia)    COPD exacerbation (HCC) 08/27/2021   Asthma, chronic 12/31/2012   Smoker 12/31/2012   Knee pain, bilateral 12/31/2012   Iron deficiency anemia 12/31/2012   Knee  pain, chronic 12/31/2012    Palliative Care Assessment & Plan   Patient  Profile: 62 y.o. male  with past medical history of COPD on home oxygen via nasal cannula 2 to 3 L/min, chronic tobacco use, alcohol abuse, hypertension, chronic back pain, malnutrition, recent admission from 08/17/2022-08/20/2022 for COPD exacerbation and pneumonia,  admitted on 10/08/2022 with worsening shortness of breath.    Patient was also seen in the ED yesterday for dyspnea but sent home.  Had not taken home medications for about 2 weeks.  Patient currently admitted for possible right basilar bacterial pneumonia, COPD exacerbation acute on chronic respiratory failure with hypoxia, failure to thrive. PMT has been consulted to assist with goals of care conversation and discussion of the option of home hospice.  Assessment: Principal Problem:   COPD exacerbation (HCC)   Terminal care  Recommendations/Plan: Continue full comfort measures Continue DNR/DNI as previously documented Patient not approved for residential hospice placement.  Now exploring discharge home with hospice.  Patient will need 24 7 supervision/assistance - patient and TOC exploring options for discharging into family's care with hospice support Continue current comfort focused medication regimen -no changes today PMT will continue to follow and support holistically  Symptom Management Morphine PRN pain/dyspnea/increased work of breathing/RR>25 Tylenol PRN pain/fever Biotin twice daily Benadryl PRN itching Robinul PRN secretions Haldol PRN agitation/delirium Ativan PRN anxiety/seizure/sleep/distress Zofran PRN nausea/vomiting Liquifilm Tears PRN dry eye Continue rocephin for 5 day course, albuterol, duoneb, solumedrol, dulera, guaifenesin, zoloft, trazodone for comfort   Goals of Care and Additional Recommendations: Limitations on Scope of Treatment: Full Comfort Care  Code Status:    Code Status Orders  (From admission, onward)            Start     Ordered   10/12/22 1041  Do not attempt resuscitation (DNR)  Continuous       Question Answer Comment  If patient has no pulse and is not breathing Do Not Attempt Resuscitation   If patient has a pulse and/or is breathing: Medical Treatment Goals COMFORT MEASURES: Keep clean/warm/dry, use medication by any route; positioning, wound care and other measures to relieve pain/suffering; use oxygen, suction/manual treatment of airway obstruction for comfort; do not transfer unless for comfort needs.   Consent: Discussion documented in EHR or advanced directives reviewed      10/12/22 1045           Code Status History     Date Active Date Inactive Code Status Order ID Comments User Context   10/08/2022 1718 10/12/2022 1045 DNR [id number]  Glade Lloyd, MD ED   08/17/2022 1342 08/20/2022 2329 DNR [id number]  Steffanie Rainwater, MD ED   04/11/2022 0955 04/20/2022 0006 DNR [id number]  Alena Bills, DO Inpatient   04/10/2022 2200 04/11/2022 0954 Full Code [id number]  Dorcas Carrow, MD Inpatient   01/24/2022 2347 01/27/2022 2033 Full Code [id number]  Eduard Clos, MD ED   09/26/2021 0437 10/03/2021 2043 Full Code [id number]  Marolyn Haller, MD ED   08/27/2021 1447 08/29/2021 1948 Full Code [id number]  Steffanie Rainwater, MD ED   03/15/2012 0317 03/15/2012 1159 Full Code [id number]  Schinlever, Arie Sabina, PA ED       Prognosis:  < 6 months  Discharge Planning: Home with Hospice  Care plan was discussed with primary RN, patient, patient's brother, TOC, Dr. Jerral Ralph  Thank you for allowing the Palliative Medicine Team to assist in the care of this patient.  Haskel Khan, NP  Please contact Palliative Medicine Team phone at [phone] for questions and concerns.   *Portions of  this note are a verbal dictation therefore any spelling and/or grammatical errors are due to the "[employer]agon [employer] One" system interpretation.

## 2022-10-13 NOTE — Progress Notes (Signed)
PROGRESS NOTE    Jaime Cline  ZOX:[id number] DOB: 1960/06/28 DOA: 10/08/2022 PCP: Patient, [name]   Brief Narrative:   62 y.o. 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  On presentation, he initially needed 10 L oxygen via nasal cannula. Chest x-ray showed possible new patchy right basilar opacity, suspicious for pneumonia. He was started on Rocephin, azithromycin and also IV Solu-Medrol was given.  Remains in poor clinical status. Shared decision making 4/25, converting to comfort care and pursuing hospice. 4/26, patient does not qualify for inpatient hospice placement.  Working on logistics at home before going home with home hospice.  Assessment & Plan:   COPD with acute exacerbation, possible community-acquired right basilar bacterial pneumonia Acute on chronic respiratory failure with hypoxia Ongoing smoker. Severe malnutrition/failure to thrive  -Patient presented with worsening dyspnea and was found to be tachypneic, tachycardic and required 10 L oxygen via nasal cannula initially in the ED.  Normally uses 2 to 3 L oxygen via nasal cannula.   -Some clinical response today. -Chest x-ray showed possible right basilar pneumonia.  Currently on Rocephin and Zithromax.  Solu-Medrol to 80 mg IV daily.  Continue nebs and Dulera. COVID-19/RSV/influenza testing negative.  Respiratory virus panel positive for rhinovirus. -Counseled regarding tobacco cessation -Overall poor prognosis.  See goal of care discussion below.   Hypomagnesemia -Replaced.  Normocytic anemia -Questionable cause.  Hemoglobin currently stable.  Monitor intermittently.   Depression -Continue sertraline and as needed Xanax.  Apparently patient has not  been taking any medications for the last 2 weeks as he ran out of them.  Goal of care discussion: Patient with advanced COPD, severe debilitating condition, cachexia.  Patient treated for hospice level of care.  Currently continuing all supportive treatment for COPD.  Patient currently not at end-of-life and not qualifying for inpatient hospice.  He is open to going home with home hospice but he does not have anybody at home at this time to be around all the time.  His son works 2 jobs.  He asked to get few days to figure out some families to be around him.  He will talk to his niece.  Appreciate palliative care involvement.   DVT prophylaxis: Comfort care Code Status: Comfort care Family Communication: None at bedside.   Disposition Plan: Status is: Severe systemic disease.  Consultants: Palliative care  Procedures: None  Antimicrobials: Rocephin and Zithromax from 10/08/2022 onwards   Subjective:  Patient seen and examined.  Today he is very pleasant to conversation.  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  His breathing is slightly better today.  Patient tells me that next few days he may be able to talk to his family is including his niece to help him at home so that he can go home with hospice support.   Objective: Vitals:   10/13/22 0518 10/13/22 0822 10/13/22 0826 10/13/22 0829  BP: 130/87 (!) 130/96    Pulse: (!) 106 96    Resp: 20 18    Temp: 97.9 F (36.6 C) 98 F (36.7 C)    TempSrc: Oral Oral    SpO2: 99% 99% 98% 98%  Weight:        Intake/Output Summary (Last 24 hours) at 10/13/2022  1412 Last data filed at 10/13/2022 0700 Gross per 24 hour  Intake --  Output 1250 ml  Net -1250 ml    Filed Weights   10/10/22 1811 10/11/22 0318 10/12/22 0434  Weight: 45.8 kg 44.1 kg 45 kg    Examination:  General: Frail and debilitated.  Cachectic.  Mildly dyspneic on conversation but able to keep up better  conversation. Cardiovascular: S1-S2 normal regular rate rhythm. Respiratory: Bilateral expiratory wheezes.  Good air entry bilateral.  Not in any distress at rest.  Slightly tachypneic on communication. Gastrointestinal: Soft.  Nontender.  Bowel sound present. Ext: No edema or swelling. Neuro: Alert and awake.  Oriented.     Data Reviewed: I have personally reviewed following labs and imaging studies  CBC: Recent Labs  Lab 10/07/22 0815 10/08/22 1400 10/09/22 0048  WBC 8.3 5.2 4.0  NEUTROABS 6.1 3.4  --   HGB 13.8 12.8* 12.1*  HCT 41.6 38.9* 35.8*  MCV 99.8 100.0 98.1  PLT 180 203 205    Basic Metabolic Panel: Recent Labs  Lab 10/07/22 0815 10/08/22 1400 10/09/22 0048  NA 133* 133* 133*  K 3.3* 3.5 3.6  CL 98 99 96*  CO2 23 22 24   GLUCOSE 107* 95 166*  BUN 18 13 15   CREATININE 0.78 0.69 0.83  CALCIUM 8.9 9.1 9.0  MG  --   --  1.6*    GFR: Estimated Creatinine Clearance: 59.5 mL/min (by C-G formula based on SCr of 0.83 mg/dL). Liver Function Tests: Recent Labs  Lab 10/09/22 0048  AST 26  ALT 18  ALKPHOS 47  BILITOT 0.6  PROT 6.7  ALBUMIN 3.2*    No results for input(s): "LIPASE", "AMYLASE" in the last 168 hours. No results for input(s): "AMMONIA" in the last 168 hours. Coagulation Profile: No results for input(s): "INR", "PROTIME" in the last 168 hours. Cardiac Enzymes: No results for input(s): "CKTOTAL", "CKMB", "CKMBINDEX", "TROPONINI" in the last 168 hours. BNP (last 3 results) No results for input(s): "PROBNP" in the last 8760 hours. HbA1C: No results for input(s): "HGBA1C" in the last 72 hours. CBG: No results for input(s): "GLUCAP" in the last 168 hours. Lipid Profile: No results for input(s): "CHOL", "HDL", "LDLCALC", "TRIG", "CHOLHDL", "LDLDIRECT" in the last 72 hours. Thyroid Function Tests: No results for input(s): "TSH", "T4TOTAL", "FREET4", "T3FREE", "THYROIDAB" in the last 72 hours. Anemia Panel: No results for input(s):  "VITAMINB12", "FOLATE", "FERRITIN", "TIBC", "IRON", "RETICCTPCT" in the last 72 hours. Sepsis Labs: Recent Labs  Lab 10/10/22 0224  PROCALCITON 0.12     Recent Results (from the past 240 hour(s))  SARS Coronavirus 2 by RT PCR (hospital order, performed in [facility] hospital lab) *cepheid single result test* Anterior Nasal Swab     Status: None   Collection Time: 10/07/22  8:15 AM   Specimen: Anterior Nasal Swab  Result Value Ref Range Status   SARS Coronavirus 2 by RT PCR NEGATIVE NEGATIVE Final    Comment: (NOTE) SARS-CoV-2 target nucleic acids are NOT DETECTED.  The SARS-CoV-2 RNA is generally detectable in upper and lower respiratory specimens during the acute phase of infection. The lowest concentration of SARS-CoV-2 viral copies this assay can detect is 250 copies / mL. A negative result does not preclude SARS-CoV-2 infection and should not be used as the sole basis for treatment or other patient management decisions.  A negative result may occur with improper specimen collection / handling, submission of specimen other than nasopharyngeal swab, presence of viral mutation(s) within the areas  targeted by this assay, and inadequate number of viral copies (<250 copies / mL). A negative result must be combined with clinical observations, patient history, and epidemiological information.  Fact Sheet for Patients:   RoadLapTop.co.za  Fact Sheet for Healthcare Providers: http://kim-miller.com/  This test is not yet approved or  cleared by the [place] FDA and has been authorized for detection and/or diagnosis of SARS-CoV-2 by FDA under an Emergency Use Authorization (EUA).  This EUA will remain in effect (meaning this test can be used) for the duration of the COVID-19 declaration under Section 564(b)(1) of the Act, 21 U.S.C. section 360bbb-3(b)(1), unless the authorization is terminated or revoked sooner.  Performed at  [facility], [address], [place], [place] [address]   Respiratory (~20 pathogens) panel by PCR     Status: Abnormal   Collection Time: 10/08/22  5:48 PM   Specimen: Nasopharyngeal Swab; Respiratory  Result Value Ref Range Status   Adenovirus NOT DETECTED NOT DETECTED Final   Coronavirus 229E NOT DETECTED NOT DETECTED Final    Comment: (NOTE) The Coronavirus on the Respiratory Panel, DOES NOT test for the novel  Coronavirus (2019 nCoV)    Coronavirus HKU1 NOT DETECTED NOT DETECTED Final   Coronavirus NL63 NOT DETECTED NOT DETECTED Final   Coronavirus OC43 NOT DETECTED NOT DETECTED Final   Metapneumovirus NOT DETECTED NOT DETECTED Final   Rhinovirus / Enterovirus DETECTED (A) NOT DETECTED Final   Influenza A NOT DETECTED NOT DETECTED Final   Influenza B NOT DETECTED NOT DETECTED Final   Parainfluenza Virus 1 NOT DETECTED NOT DETECTED Final   Parainfluenza Virus 2 NOT DETECTED NOT DETECTED Final   Parainfluenza Virus 3 NOT DETECTED NOT DETECTED Final   Parainfluenza Virus 4 NOT DETECTED NOT DETECTED Final   Respiratory Syncytial Virus NOT DETECTED NOT DETECTED Final   Bordetella pertussis NOT DETECTED NOT DETECTED Final   Bordetella Parapertussis NOT DETECTED NOT DETECTED Final   Chlamydophila pneumoniae NOT DETECTED NOT DETECTED Final   Mycoplasma pneumoniae NOT DETECTED NOT DETECTED Final    Comment: Performed at [facility], [address] [place], [place] [address]  Resp panel by RT-PCR (RSV, Flu A&B, Covid) Anterior Nasal Swab     Status: None   Collection Time: 10/08/22  5:48 PM   Specimen: Anterior Nasal Swab  Result Value Ref Range Status   SARS Coronavirus 2 by RT PCR NEGATIVE NEGATIVE Final   Influenza A by PCR NEGATIVE NEGATIVE Final   Influenza B by PCR NEGATIVE NEGATIVE Final    Comment: (NOTE) The Xpert Xpress SARS-CoV-2/FLU/RSV plus assay is intended as an aid in the diagnosis of influenza from Nasopharyngeal swab specimens  and should not be used as a sole basis for treatment. Nasal washings and aspirates are unacceptable for Xpert Xpress SARS-CoV-2/FLU/RSV testing.  Fact Sheet for Patients: BloggerCourse.com  Fact Sheet for Healthcare Providers: SeriousBroker.it  This test is not yet approved or cleared by the [place] FDA and has been authorized for detection and/or diagnosis of SARS-CoV-2 by FDA under an Emergency Use Authorization (EUA). This EUA will remain in effect (meaning this test can be used) for the duration of the COVID-19 declaration under Section 564(b)(1) of the Act, 21 U.S.C. section 360bbb-3(b)(1), unless the authorization is terminated or revoked.     Resp Syncytial Virus by PCR NEGATIVE NEGATIVE Final    Comment: (NOTE) Fact Sheet for Patients: BloggerCourse.com  Fact Sheet for Healthcare Providers: SeriousBroker.it  This test is not yet approved  or cleared by the [place] and has been authorized for detection and/or diagnosis of SARS-CoV-2 by FDA under an Emergency Use Authorization (EUA). This EUA will remain in effect (meaning this test can be used) for the duration of the COVID-19 declaration under Section 564(b)(1) of the Act, 21 U.S.C. section 360bbb-3(b)(1), unless the authorization is terminated or revoked.  Performed at [facility], [address] [place], [place] [address]          Radiology Studies: No results found.      Scheduled Meds:  antiseptic oral rinse  15 mL Topical BID   guaiFENesin  1,200 mg Oral BID   ipratropium-albuterol  3 mL Nebulization Q6H   methylPREDNISolone (SOLU-MEDROL) injection  80 mg Intravenous Q24H   mometasone-formoterol  2 puff Inhalation BID   sertraline  50 mg Oral Daily   Continuous Infusions:  cefTRIAXone (ROCEPHIN)  IV 2 g (10/12/22 1829)      Total time spent: 35 minutes    Dorcas Carrow, MD [facility] 10/13/2022, 2:12 PM

## 2022-10-13 NOTE — Plan of Care (Signed)
  Problem: Activity: Goal: Ability to tolerate increased activity will improve Outcome: Progressing Goal: Will verbalize the importance of balancing activity with adequate rest periods Outcome: Progressing   Problem: Respiratory: Goal: Ability to maintain a clear airway will improve Outcome: Progressing Goal: Levels of oxygenation will improve Outcome: Progressing Goal: Ability to maintain adequate ventilation will improve Outcome: Progressing   

## 2022-10-14 DIAGNOSIS — Z515 Encounter for palliative care: Secondary | ICD-10-CM | POA: Diagnosis not present

## 2022-10-14 DIAGNOSIS — J189 Pneumonia, unspecified organism: Secondary | ICD-10-CM | POA: Diagnosis not present

## 2022-10-14 DIAGNOSIS — J441 Chronic obstructive pulmonary disease with (acute) exacerbation: Secondary | ICD-10-CM | POA: Diagnosis not present

## 2022-10-14 MED ORDER — IPRATROPIUM-ALBUTEROL 0.5-2.5 (3) MG/3ML IN SOLN
3.0000 mL | Freq: Three times a day (TID) | RESPIRATORY_TRACT | Status: DC
  Administered 2022-10-14: 3 mL via RESPIRATORY_TRACT
  Filled 2022-10-14 (×2): qty 3

## 2022-10-14 MED ORDER — PREDNISONE 20 MG PO TABS
40.0000 mg | ORAL_TABLET | Freq: Every day | ORAL | Status: DC
  Administered 2022-10-15 – 2022-10-17 (×3): 40 mg via ORAL
  Filled 2022-10-14 (×3): qty 2

## 2022-10-14 NOTE — Progress Notes (Signed)
Daily Progress Note   Patient Name: Jaime Cline       Date: 10/14/2022 DOB: March 09, 1961  Age: 62 y.o. MRN#: [id number] Attending Physician: Dorcas Carrow, MD Primary Care Physician: Patient, [name] Admit Date: 10/08/2022  Reason for Consultation/Follow-up: Establishing goals of care  Subjective: I have reviewed medical records including EPIC notes, MAR, and labs. Patient required 1 dose of PRN ativan in the past 24 hours. Patient assessed at the bedside and reports feeling much better today. No visitors present.  Patient confirms he has made the decision to go home with hospice at his uncle's house.  He feels this will be arranged for him to be ready to go in a couple of days.  He is glad that he got some rest after taking trazodone last night.  He does not feel any adjustments to his comfort medications required today.  Requesting coffee and Ensure, assistance provided.  All questions and concerns addressed. Encouraged to call with questions and/or concerns. PMT will continue to support holistically.   Length of Stay: 5  Physical Exam Vitals and nursing note reviewed.  Constitutional:      General: He is not in acute distress.    Appearance: He is ill-appearing.  Pulmonary:     Effort: No respiratory distress.  Skin:    General: Skin is warm and dry.  Neurological:     Mental Status: He is alert and oriented to person, place, and time.     Motor: Weakness present.  Psychiatric:        Attention and Perception: Attention normal.        Behavior: Behavior is cooperative.        Cognition and Memory: Cognition and memory normal.            Vital Signs: BP (!) 112/98 (BP Location: Right Arm)   Pulse (!) 104   Temp 97.8 F (36.6 C) (Oral)   Resp 17   Wt 45 kg   SpO2 95%   BMI  14.23 kg/m  SpO2: SpO2: 95 % O2 Device: O2 Device: Room Air O2 Flow Rate: O2 Flow Rate (L/min): 2 L/min (found on [facility])       Palliative Assessment/Data: PPS 40%   Palliative Care Assessment & Plan   Patient Profile: 62 y.o. male  with past medical  history of COPD on home oxygen via nasal cannula 2 to 3 L/min, chronic tobacco use, alcohol abuse, hypertension, chronic back pain, malnutrition, recent admission from 08/17/2022-08/20/2022 for COPD exacerbation and pneumonia,  admitted on 10/08/2022 with worsening shortness of breath.    Patient was also seen in the ED yesterday for dyspnea but sent home.  Had not taken home medications for about 2 weeks.  Patient currently admitted for possible right basilar bacterial pneumonia, COPD exacerbation acute on chronic respiratory failure with hypoxia, failure to thrive. PMT has been consulted to assist with goals of care conversation and discussion of the option of home hospice.  Assessment: Principal Problem:   COPD exacerbation (HCC)   Terminal care  Recommendations/Plan: Continue DNR/DNI  Continue comfort care, no adjustments required today Psychosocial and emotional support provided Patient would like to go home with hospice at his uncles house, requesting some time to make the arrangements before discharge PMT will continue to follow and support holistically  Symptom Management Morphine PRN pain/dyspnea/increased work of breathing/RR>25 Tylenol PRN pain/fever Biotin twice daily Benadryl PRN itching Robinul PRN secretions Haldol PRN agitation/delirium Ativan PRN anxiety/seizure/sleep/distress Zofran PRN nausea/vomiting Liquifilm Tears PRN dry eye Continue rocephin, albuterol, duoneb, solumedrol, dulera, guaifenesin, zoloft, trazodone for comfort    Prognosis:  < 6 months   Discharge Planning: Home with Hospice  Care plan was discussed with primary RN, patient, Dr. Jerral Ralph, [facility]   Total time: I spent 35 minutes in the care of  the patient today in the above activities and documenting the encounter.   Richardson Dopp, PA-C Palliative Medicine Team Team phone # [phone]  Thank you for allowing the Palliative Medicine Team to assist in the care of this patient. Please utilize secure chat with additional questions, if there is no response within 30 minutes please call the above phone number.  Palliative Medicine Team providers are available by phone from 7am to 7pm daily and can be reached through the team cell phone.  Should this patient require assistance outside of these hours, please call the patient's attending physician.  Portions of this note are a verbal dictation therefore any spelling and/or grammatical errors are due to the "Dragon Medical One" system interpretation.

## 2022-10-14 NOTE — Progress Notes (Signed)
PROGRESS NOTE    QUADARIUS HENTON  ZOX:[id number] DOB: 09-Sep-1960 DOA: 10/08/2022 PCP: Patient, [name]   Brief Narrative:  62 y.o. 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  On presentation, he initially needed 10 L oxygen via nasal cannula. Chest x-ray showed possible new patchy right basilar opacity, suspicious for pneumonia. He was started on Rocephin, azithromycin and also IV Solu-Medrol was given.  Remains in poor clinical status. Shared decision making 4/25, converting to comfort care and pursuing hospice. 4/26, patient does not qualify for inpatient hospice placement.  Working on logistics at home before going home with home hospice.  Assessment & Plan:   COPD with acute exacerbation, possible community-acquired right basilar bacterial pneumonia, possible exacerbation due to rhinovirus infection. Acute on chronic respiratory failure with hypoxia Ongoing smoker. Severe malnutrition/failure to thrive  Some clinical improvement today.  On Solu-Medrol, will change to prednisone.  Patient will need prolonged taper and maintenance prednisone. Chest x-ray showed right basilar pneumonia, completed 5 days of antibiotic therapy. Continue nebs and Dulera. COVID-19, RSV and influenza were negative.  Respiratory virus panel was positive for rhinovirus. Continue all supportive care. With overall poor prognosis, home with hospice was discussed and patient has agreed. 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 His uncle who lives in [place] is willing to take him home.  He would like to work with his uncle.  If he goes to his uncle's house, will have to inform local hospice providers to provide care at  home.  Hypomagnesemia -Replaced.  Normocytic anemia - Hemoglobin currently stable.  Monitor intermittently.   Depression -Continue sertraline and as needed Xanax.  Apparently patient has not been taking any medications for the last 2 weeks as he ran out of them.  Goal of care discussion: Patient with advanced COPD, severe debilitating condition, cachexia.  Stabilizing with aggressive treatment.  Currently continuing all supportive treatment for COPD. Patient not currently at end-of-life, however with severe COPD and frailty reaching end-of-life. Does not qualify to go to in-house hospice, patient understands this. He is working either with his uncle or his niece to go and live with them and have a home hospice arrangements for support. Appreciate palliative care and case management help.   DVT prophylaxis: Comfort care Code Status: Comfort care Family Communication: None at bedside.   Disposition Plan: Status is: Severe systemic disease.  Safe disposition plan pending.  Consultants: Palliative care  Procedures: None  Antimicrobials: Rocephin and Zithromax from 10/08/2022-4/26   Subjective:  Patient seen and examined.  He is breathing much better now.  He is talking to his niece for arrangements at her home for him to go home.  He is very clear in his conversation that he does not want to keep coming back to the hospital. He is also talking to his uncle whose wife just died and uncle wants him to come his home.  He wants few days to figure all this out.   Objective: Vitals:   10/14/22 0349 10/14/22 0708 10/14/22 0851 10/14/22 0918  BP:   (!) 112/98   Pulse:   (!) 104   Resp:   17   Temp:   97.8 F (36.6 C)   TempSrc:   Oral   SpO2:  97% 94% 100% 95%  Weight:        Intake/Output Summary (Last 24 hours) at 10/14/2022 1143 Last data filed at 10/14/2022 0800 Gross per 24 hour  Intake 240 ml  Output 200 ml  Net 40 ml   Filed Weights   10/10/22 1811 10/11/22 0318  10/12/22 0434  Weight: 45.8 kg 44.1 kg 45 kg    Examination:  General: Frail and debilitated.  Cachectic.  Mild discomfort on conversation otherwise fairly comfortable. Cardiovascular: S1-S2 normal regular rate rhythm. Respiratory: Good air entry bilateral.  Not in any distress at rest.  On minimal oxygen.  Usually puts it in his mouth. Gastrointestinal: Soft.  Nontender.  Bowel sound present. Ext: No edema or swelling. Neuro: Alert and awake.  Oriented.     Data Reviewed: I have personally reviewed following labs and imaging studies  CBC: Recent Labs  Lab 10/08/22 1400 10/09/22 0048  WBC 5.2 4.0  NEUTROABS 3.4  --   HGB 12.8* 12.1*  HCT 38.9* 35.8*  MCV 100.0 98.1  PLT 203 205   Basic Metabolic Panel: Recent Labs  Lab 10/08/22 1400 10/09/22 0048  NA 133* 133*  K 3.5 3.6  CL 99 96*  CO2 22 24  GLUCOSE 95 166*  BUN 13 15  CREATININE 0.69 0.83  CALCIUM 9.1 9.0  MG  --  1.6*   GFR: Estimated Creatinine Clearance: 59.5 mL/min (by C-G formula based on SCr of 0.83 mg/dL). Liver Function Tests: Recent Labs  Lab 10/09/22 0048  AST 26  ALT 18  ALKPHOS 47  BILITOT 0.6  PROT 6.7  ALBUMIN 3.2*   No results for input(s): "LIPASE", "AMYLASE" in the last 168 hours. No results for input(s): "AMMONIA" in the last 168 hours. Coagulation Profile: No results for input(s): "INR", "PROTIME" in the last 168 hours. Cardiac Enzymes: No results for input(s): "CKTOTAL", "CKMB", "CKMBINDEX", "TROPONINI" in the last 168 hours. BNP (last 3 results) No results for input(s): "PROBNP" in the last 8760 hours. HbA1C: No results for input(s): "HGBA1C" in the last 72 hours. CBG: No results for input(s): "GLUCAP" in the last 168 hours. Lipid Profile: No results for input(s): "CHOL", "HDL", "LDLCALC", "TRIG", "CHOLHDL", "LDLDIRECT" in the last 72 hours. Thyroid Function Tests: No results for input(s): "TSH", "T4TOTAL", "FREET4", "T3FREE", "THYROIDAB" in the last 72 hours. Anemia  Panel: No results for input(s): "VITAMINB12", "FOLATE", "FERRITIN", "TIBC", "IRON", "RETICCTPCT" in the last 72 hours. Sepsis Labs: Recent Labs  Lab 10/10/22 0224  PROCALCITON 0.12    Recent Results (from the past 240 hour(s))  SARS Coronavirus 2 by RT PCR (hospital order, performed in [facility] hospital lab) *cepheid single result test* Anterior Nasal Swab     Status: None   Collection Time: 10/07/22  8:15 AM   Specimen: Anterior Nasal Swab  Result Value Ref Range Status   SARS Coronavirus 2 by RT PCR NEGATIVE NEGATIVE Final    Comment: (NOTE) SARS-CoV-2 target nucleic acids are NOT DETECTED.  The SARS-CoV-2 RNA is generally detectable in upper and lower respiratory specimens during the acute phase of infection. The lowest concentration of SARS-CoV-2 viral copies this assay can detect is 250 copies / mL. A negative result does not preclude SARS-CoV-2 infection and should not be used as the sole basis for treatment or other patient management decisions.  A negative result may occur with improper specimen collection / handling, submission of specimen other than nasopharyngeal swab, presence of viral mutation(s) within the areas targeted by this assay, and inadequate number  of viral copies (<250 copies / mL). A negative result must be combined with clinical observations, patient history, and epidemiological information.  Fact Sheet for Patients:   RoadLapTop.co.za  Fact Sheet for Healthcare Providers: http://kim-miller.com/  This test is not yet approved or  cleared by the [place] FDA and has been authorized for detection and/or diagnosis of SARS-CoV-2 by FDA under an Emergency Use Authorization (EUA).  This EUA will remain in effect (meaning this test can be used) for the duration of the COVID-19 declaration under Section 564(b)(1) of the Act, 21 U.S.C. section 360bbb-3(b)(1), unless the authorization is terminated  or revoked sooner.  Performed at [facility], [address] [place], [place] [address]   Respiratory (~20 pathogens) panel by PCR     Status: Abnormal   Collection Time: 10/08/22  5:48 PM   Specimen: Nasopharyngeal Swab; Respiratory  Result Value Ref Range Status   Adenovirus NOT DETECTED NOT DETECTED Final   Coronavirus 229E NOT DETECTED NOT DETECTED Final    Comment: (NOTE) The Coronavirus on the Respiratory Panel, DOES NOT test for the novel  Coronavirus (2019 nCoV)    Coronavirus HKU1 NOT DETECTED NOT DETECTED Final   Coronavirus NL63 NOT DETECTED NOT DETECTED Final   Coronavirus OC43 NOT DETECTED NOT DETECTED Final   Metapneumovirus NOT DETECTED NOT DETECTED Final   Rhinovirus / Enterovirus DETECTED (A) NOT DETECTED Final   Influenza A NOT DETECTED NOT DETECTED Final   Influenza B NOT DETECTED NOT DETECTED Final   Parainfluenza Virus 1 NOT DETECTED NOT DETECTED Final   Parainfluenza Virus 2 NOT DETECTED NOT DETECTED Final   Parainfluenza Virus 3 NOT DETECTED NOT DETECTED Final   Parainfluenza Virus 4 NOT DETECTED NOT DETECTED Final   Respiratory Syncytial Virus NOT DETECTED NOT DETECTED Final   Bordetella pertussis NOT DETECTED NOT DETECTED Final   Bordetella Parapertussis NOT DETECTED NOT DETECTED Final   Chlamydophila pneumoniae NOT DETECTED NOT DETECTED Final   Mycoplasma pneumoniae NOT DETECTED NOT DETECTED Final    Comment: Performed at [facility], [address], [place], [place] [address]  Resp panel by RT-PCR (RSV, Flu A&B, Covid) Anterior Nasal Swab     Status: None   Collection Time: 10/08/22  5:48 PM   Specimen: Anterior Nasal Swab  Result Value Ref Range Status   SARS Coronavirus 2 by RT PCR NEGATIVE NEGATIVE Final   Influenza A by PCR NEGATIVE NEGATIVE Final   Influenza B by PCR NEGATIVE NEGATIVE Final    Comment: (NOTE) The Xpert Xpress SARS-CoV-2/FLU/RSV plus assay is intended as an aid in the diagnosis of influenza from  Nasopharyngeal swab specimens and should not be used as a sole basis for treatment. Nasal washings and aspirates are unacceptable for Xpert Xpress SARS-CoV-2/FLU/RSV testing.  Fact Sheet for Patients: BloggerCourse.com  Fact Sheet for Healthcare Providers: SeriousBroker.it  This test is not yet approved or cleared by the [place] FDA and has been authorized for detection and/or diagnosis of SARS-CoV-2 by FDA under an Emergency Use Authorization (EUA). This EUA will remain in effect (meaning this test can be used) for the duration of the COVID-19 declaration under Section 564(b)(1) of the Act, 21 U.S.C. section 360bbb-3(b)(1), unless the authorization is terminated or revoked.     Resp Syncytial Virus by PCR NEGATIVE NEGATIVE Final    Comment: (NOTE) Fact Sheet for Patients: BloggerCourse.com  Fact Sheet for Healthcare Providers: SeriousBroker.it  This test is not yet approved or cleared by the [place] FDA  and has been authorized for detection and/or diagnosis of SARS-CoV-2 by FDA under an Emergency Use Authorization (EUA). This EUA will remain in effect (meaning this test can be used) for the duration of the COVID-19 declaration under Section 564(b)(1) of the Act, 21 U.S.C. section 360bbb-3(b)(1), unless the authorization is terminated or revoked.  Performed at [facility], [address], [place], [place] [address]          Radiology Studies: No results found.      Scheduled Meds:  antiseptic oral rinse  15 mL Topical BID   guaiFENesin  1,200 mg Oral BID   ipratropium-albuterol  3 mL Nebulization Q6H   mometasone-formoterol  2 puff Inhalation BID   [START ON 10/15/2022] predniSONE  40 mg Oral Q breakfast   sertraline  50 mg Oral Daily   Continuous Infusions:      Total time spent: 35 minutes    Dorcas Carrow, MD [facility] 10/14/2022, 11:43 AM

## 2022-10-15 DIAGNOSIS — J441 Chronic obstructive pulmonary disease with (acute) exacerbation: Secondary | ICD-10-CM | POA: Diagnosis not present

## 2022-10-15 MED ORDER — IPRATROPIUM-ALBUTEROL 0.5-2.5 (3) MG/3ML IN SOLN
3.0000 mL | Freq: Four times a day (QID) | RESPIRATORY_TRACT | Status: DC | PRN
  Administered 2022-10-15: 3 mL via RESPIRATORY_TRACT
  Filled 2022-10-15: qty 3

## 2022-10-15 NOTE — Progress Notes (Signed)
PROGRESS NOTE    Jaime Cline  ZOX:[id number] DOB: [birth year] DOA: 10/08/2022 PCP: Patient, [name]   Brief Narrative:  62 y.o. 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  On presentation, he initially needed 10 L oxygen via nasal cannula. Chest x-ray showed possible new patchy right basilar opacity, suspicious for pneumonia. He was started on Rocephin, azithromycin and also IV Solu-Medrol was given.  Remains in poor clinical status. Shared decision making 4/25, converting to comfort care and pursuing hospice. 4/26, patient does not qualify for inpatient hospice placement.  Working on logistics at home before going home with home hospice.  Assessment & Plan:   COPD with acute exacerbation, possible community-acquired right basilar bacterial pneumonia, possible exacerbation due to rhinovirus infection. Acute on chronic respiratory failure with hypoxia Ongoing smoker. Severe malnutrition/failure to thrive  Some clinical improvement now.  Prednisone, will need prolonged taper and maintenance prednisone. Chest x-ray showed right basilar pneumonia, completed 5 days of antibiotic therapy. Continue nebs and Dulera. COVID-19, RSV and influenza were negative.  Respiratory virus panel was positive for rhinovirus. Continue all supportive care. With overall poor prognosis, home with hospice was discussed and patient has agreed. 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 His uncle who lives in [place] is willing to take him home.  He would like to work with his uncle.  If he goes to his uncle's house, will have to inform local hospice providers to provide care at home.  Depression -Continue sertraline and  as needed Xanax.  Apparently patient has not been taking any medications for the last 2 weeks as he ran out of them.  Goal of care discussion: Patient with advanced COPD, severe debilitating condition, cachexia.  Stabilizing with aggressive treatment.  Currently continuing all supportive treatment for COPD. Patient not currently at end-of-life, however with severe COPD and frailty reaching end-of-life. Does not qualify to go to in-house hospice, patient understands this. He is trying to coordinate living arrangement with his uncle in [facility], will refer to local hospice care for home hospice.  Addendum:  Detailed call with patient's sister Jaime Cline.  She lives in [place]. She is going to coordinate with her primary, their Uncle and everybody about this place for him to go. Whenever they decide best place to go for permanent living situation, will make referral to the local hospice provider.   DVT prophylaxis: Comfort care Code Status: Comfort care Family Communication: None at bedside.   Disposition Plan: Status is: Severe systemic disease.  Safe disposition plan pending.  Consultants: Palliative care  Procedures: None  Antimicrobials: Rocephin and Zithromax from 10/08/2022-4/26   Subjective:  Patient seen and examined.  Breathing more or less stable.  Pleasant.  He keeps his nasal cannula oxygen and his mouth. He has discussed with his uncle and uncle has agreed to take him home however he will need about a week. We discussed that since patient is stable, he may not be able to stay in the hospital for more than next 24 to 48 hours.  If it does not work out with his uncle, he will go to his current home and go to [employer].   Objective: Vitals:   10/14/22 2320 10/15/22 0700 10/15/22 0713 10/15/22  0806  BP: 96/83   115/72  Pulse:    92  Resp: 16   18  Temp: 98.8 F (37.1 C) 98.7 F (37.1 C)  98.7 F (37.1 C)  TempSrc: Oral   Oral  SpO2:  99%  95% 97%  Weight:        Intake/Output Summary (Last 24 hours) at 10/15/2022 1457 Last data filed at 10/15/2022 0900 Gross per 24 hour  Intake 480 ml  Output 1100 ml  Net -620 ml   Filed Weights   10/10/22 1811 10/11/22 0318 10/12/22 0434  Weight: 45.8 kg 44.1 kg 45 kg    Examination:  General: Frail and debilitated.  Cachectic.  Mild discomfort on conversation otherwise fairly comfortable. Cardiovascular: S1-S2 normal regular rate rhythm. Respiratory: Good air entry bilateral.  Not in any distress at rest.  On minimal oxygen.  Usually puts it in his mouth. Gastrointestinal: Soft.  Nontender.  Bowel sound present. Ext: No edema or swelling. Neuro: Alert and awake.  Oriented.     Data Reviewed: I have personally reviewed following labs and imaging studies  CBC: Recent Labs  Lab 10/09/22 0048  WBC 4.0  HGB 12.1*  HCT 35.8*  MCV 98.1  PLT 205   Basic Metabolic Panel: Recent Labs  Lab 10/09/22 0048  NA 133*  K 3.6  CL 96*  CO2 24  GLUCOSE 166*  BUN 15  CREATININE 0.83  CALCIUM 9.0  MG 1.6*   GFR: Estimated Creatinine Clearance: 59.5 mL/min (by C-G formula based on SCr of 0.83 mg/dL). Liver Function Tests: Recent Labs  Lab 10/09/22 0048  AST 26  ALT 18  ALKPHOS 47  BILITOT 0.6  PROT 6.7  ALBUMIN 3.2*   No results for input(s): "LIPASE", "AMYLASE" in the last 168 hours. No results for input(s): "AMMONIA" in the last 168 hours. Coagulation Profile: No results for input(s): "INR", "PROTIME" in the last 168 hours. Cardiac Enzymes: No results for input(s): "CKTOTAL", "CKMB", "CKMBINDEX", "TROPONINI" in the last 168 hours. BNP (last 3 results) No results for input(s): "PROBNP" in the last 8760 hours. HbA1C: No results for input(s): "HGBA1C" in the last 72 hours. CBG: No results for input(s): "GLUCAP" in the last 168 hours. Lipid Profile: No results for input(s): "CHOL", "HDL", "LDLCALC", "TRIG", "CHOLHDL", "LDLDIRECT" in the last 72  hours. Thyroid Function Tests: No results for input(s): "TSH", "T4TOTAL", "FREET4", "T3FREE", "THYROIDAB" in the last 72 hours. Anemia Panel: No results for input(s): "VITAMINB12", "FOLATE", "FERRITIN", "TIBC", "IRON", "RETICCTPCT" in the last 72 hours. Sepsis Labs: Recent Labs  Lab 10/10/22 0224  PROCALCITON 0.12    Recent Results (from the past 240 hour(s))  SARS Coronavirus 2 by RT PCR (hospital order, performed in [facility] hospital lab) *cepheid single result test* Anterior Nasal Swab     Status: None   Collection Time: 10/07/22  8:15 AM   Specimen: Anterior Nasal Swab  Result Value Ref Range Status   SARS Coronavirus 2 by RT PCR NEGATIVE NEGATIVE Final    Comment: (NOTE) SARS-CoV-2 target nucleic acids are NOT DETECTED.  The SARS-CoV-2 RNA is generally detectable in upper and lower respiratory specimens during the acute phase of infection. The lowest concentration of SARS-CoV-2 viral copies this assay can detect is 250 copies / mL. A negative result does not preclude SARS-CoV-2 infection and should not be used as the sole basis for treatment or other patient management decisions.  A negative result may occur with improper specimen collection / handling, submission of specimen other than  nasopharyngeal swab, presence of viral mutation(s) within the areas targeted by this assay, and inadequate number of viral copies (<250 copies / mL). A negative result must be combined with clinical observations, patient history, and epidemiological information.  Fact Sheet for Patients:   RoadLapTop.co.za  Fact Sheet for Healthcare Providers: http://kim-miller.com/  This test is not yet approved or  cleared by the [place] FDA and has been authorized for detection and/or diagnosis of SARS-CoV-2 by FDA under an Emergency Use Authorization (EUA).  This EUA will remain in effect (meaning this test can be used) for the duration of  the COVID-19 declaration under Section 564(b)(1) of the Act, 21 U.S.C. section 360bbb-3(b)(1), unless the authorization is terminated or revoked sooner.  Performed at [facility], [address], [place], [place] [address]   Respiratory (~20 pathogens) panel by PCR     Status: Abnormal   Collection Time: 10/08/22  5:48 PM   Specimen: Nasopharyngeal Swab; Respiratory  Result Value Ref Range Status   Adenovirus NOT DETECTED NOT DETECTED Final   Coronavirus 229E NOT DETECTED NOT DETECTED Final    Comment: (NOTE) The Coronavirus on the Respiratory Panel, DOES NOT test for the novel  Coronavirus (2019 nCoV)    Coronavirus HKU1 NOT DETECTED NOT DETECTED Final   Coronavirus NL63 NOT DETECTED NOT DETECTED Final   Coronavirus OC43 NOT DETECTED NOT DETECTED Final   Metapneumovirus NOT DETECTED NOT DETECTED Final   Rhinovirus / Enterovirus DETECTED (A) NOT DETECTED Final   Influenza A NOT DETECTED NOT DETECTED Final   Influenza B NOT DETECTED NOT DETECTED Final   Parainfluenza Virus 1 NOT DETECTED NOT DETECTED Final   Parainfluenza Virus 2 NOT DETECTED NOT DETECTED Final   Parainfluenza Virus 3 NOT DETECTED NOT DETECTED Final   Parainfluenza Virus 4 NOT DETECTED NOT DETECTED Final   Respiratory Syncytial Virus NOT DETECTED NOT DETECTED Final   Bordetella pertussis NOT DETECTED NOT DETECTED Final   Bordetella Parapertussis NOT DETECTED NOT DETECTED Final   Chlamydophila pneumoniae NOT DETECTED NOT DETECTED Final   Mycoplasma pneumoniae NOT DETECTED NOT DETECTED Final    Comment: Performed at [facility], [address], [place], [place] [address]  Resp panel by RT-PCR (RSV, Flu A&B, Covid) Anterior Nasal Swab     Status: None   Collection Time: 10/08/22  5:48 PM   Specimen: Anterior Nasal Swab  Result Value Ref Range Status   SARS Coronavirus 2 by RT PCR NEGATIVE NEGATIVE Final   Influenza A by PCR NEGATIVE NEGATIVE Final   Influenza B by PCR NEGATIVE NEGATIVE  Final    Comment: (NOTE) The Xpert Xpress SARS-CoV-2/FLU/RSV plus assay is intended as an aid in the diagnosis of influenza from Nasopharyngeal swab specimens and should not be used as a sole basis for treatment. Nasal washings and aspirates are unacceptable for Xpert Xpress SARS-CoV-2/FLU/RSV testing.  Fact Sheet for Patients: BloggerCourse.com  Fact Sheet for Healthcare Providers: SeriousBroker.it  This test is not yet approved or cleared by the [place] FDA and has been authorized for detection and/or diagnosis of SARS-CoV-2 by FDA under an Emergency Use Authorization (EUA). This EUA will remain in effect (meaning this test can be used) for the duration of the COVID-19 declaration under Section 564(b)(1) of the Act, 21 U.S.C. section 360bbb-3(b)(1), unless the authorization is terminated or revoked.     Resp Syncytial Virus by PCR NEGATIVE NEGATIVE Final    Comment: (NOTE) Fact Sheet for Patients: BloggerCourse.com  Fact Sheet for Healthcare  Providers: SeriousBroker.it  This test is not yet approved or cleared by the [place] and has been authorized for detection and/or diagnosis of SARS-CoV-2 by FDA under an Emergency Use Authorization (EUA). This EUA will remain in effect (meaning this test can be used) for the duration of the COVID-19 declaration under Section 564(b)(1) of the Act, 21 U.S.C. section 360bbb-3(b)(1), unless the authorization is terminated or revoked.  Performed at [facility], [address], [place], [place] [address]          Radiology Studies: No results found.      Scheduled Meds:  antiseptic oral rinse  15 mL Topical BID   guaiFENesin  1,200 mg Oral BID   mometasone-formoterol  2 puff Inhalation BID   predniSONE  40 mg Oral Q breakfast   sertraline  50 mg Oral Daily   Continuous Infusions:      Total time  spent: 35 minutes    Dorcas Carrow, MD [facility] 10/15/2022, 2:57 PM

## 2022-10-15 NOTE — Progress Notes (Signed)
Mobility Specialist: Progress Note   10/15/22 1653  Mobility  Activity Ambulated with assistance in hallway  Level of Assistance Standby assist, set-up cues, supervision of patient - no hands on  Assistive Device Front wheel walker  Distance Ambulated (ft) 230 ft  Activity Response Tolerated well  Mobility Referral Yes  $Mobility charge 1 Mobility   Pre-Mobility: 103 HR, 96% SpO2 Post-Mobility: 121 HR, 98% SpO2  Pt received in the bed and agreeable to mobility. Mod I with bed mobility and standby assist during ambulation. Stopped x3 for standing breaks secondary to SOB, otherwise asymptomatic. Pt back to bed after session with call bell and phone in reach.   Fauna Neuner Mobility Specialist Please contact via SecureChat or Rehab office at [phone]

## 2022-10-15 NOTE — Progress Notes (Signed)
   Medical records reviewed. Patient has not required any PRN medications in the past 24 hours. He is still in the process of coordinating with family to determine where he will live and receive hospice services after discharge.  PMT will follow peripherally and remains available acutely for inpatient comfort care needs. Appreciate TOC assistance with hospice referral and disposition planning.  Thank you for your referral and allowing PMT to assist in [name] care.   Richardson Dopp, [facility] Palliative Medicine Team  Team Phone # [phone]   NO CHARGE

## 2022-10-16 DIAGNOSIS — J441 Chronic obstructive pulmonary disease with (acute) exacerbation: Secondary | ICD-10-CM | POA: Diagnosis not present

## 2022-10-16 MED ORDER — MUSCLE RUB 10-15 % EX CREA
TOPICAL_CREAM | CUTANEOUS | Status: DC | PRN
  Filled 2022-10-16: qty 85

## 2022-10-16 MED ORDER — IPRATROPIUM-ALBUTEROL 0.5-2.5 (3) MG/3ML IN SOLN
3.0000 mL | Freq: Two times a day (BID) | RESPIRATORY_TRACT | Status: DC
  Administered 2022-10-16 – 2022-10-17 (×2): 3 mL via RESPIRATORY_TRACT
  Filled 2022-10-16 (×2): qty 3

## 2022-10-16 NOTE — TOC Progression Note (Signed)
Transition of Care [facility]) - Progression Note    Patient Details  Name: Jaime Cline MRN: [id number] Date of Birth: 06/14/1961  Transition [facility]) CM/SW Contact  Jaime Foley Jacklynn Ganong, RN Phone Number: 10/16/2022, 4:22 PM  Clinical Narrative:    Case Manager was asked to contact patient's sister, Jaime Cline, [phone], she has questions regarding patient's discharge. She states that their  brother lives in [place], and uncle -Jaime Cline lives in [place], none of them are able to have patient live with them. Patient has stated he will be going back home to his son's place at discharge. Case manager has left several messages on son's ph. [phone], with no response.    Expected Discharge Plan: Home w Home Health Services Barriers to Discharge: Continued Medical Work up  Expected Discharge Plan and Services In-house Referral: Hospice / Palliative Care Discharge Planning Services: CM Consult Post Acute Care Choice: Home Health Living arrangements for the past 2 months: Apartment                 DME Arranged: N/A DME Agency: NA       HH Arranged: PT HH Agency: [facility] Date HH Agency Contacted: 10/13/22 Time HH Agency Contacted: 1120 Representative spoke with at [facility] Agency: Jaime Cline   Social Determinants of Health (SDOH) Interventions SDOH Screenings   Food Insecurity: No Food Insecurity (10/09/2022)  Housing: Low Risk  (10/09/2022)  Transportation Needs: No Transportation Needs (10/09/2022)  Utilities: Not At Risk (10/09/2022)  Tobacco Use: High Risk (10/07/2022)    Readmission Risk Interventions    04/12/2022    4:02 PM 01/27/2022   12:27 PM  Readmission Risk Prevention Plan  Transportation Screening Complete Complete  PCP or Specialist Appt within 5-7 Days Complete Not Complete  Not Complete comments  new PCP made for 8/28  Home Care Screening Complete Complete  Medication Review (RN CM) Complete Complete

## 2022-10-16 NOTE — Progress Notes (Signed)
PROGRESS NOTE    Jaime Cline  UXL:[id number] DOB: 04-15-1961 DOA: 10/08/2022 PCP: Patient, [name]   Brief Narrative:  62 y.o. 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  On presentation, he initially needed 10 L oxygen via nasal cannula. Chest x-ray showed possible new patchy right basilar opacity, suspicious for pneumonia. He was started on Rocephin, azithromycin and also IV Solu-Medrol was given.  Remains in poor clinical status. Shared decision making 4/25, converting to comfort care and pursuing hospice. 4/26, patient does not qualify for inpatient hospice placement.  Working on logistics at home before going home with home hospice.  Assessment & Plan:   COPD with acute exacerbation, possible community-acquired right basilar bacterial pneumonia, possible exacerbation due to rhinovirus infection. Acute on chronic respiratory failure with hypoxia Ongoing smoker. Severe malnutrition/failure to thrive  Some clinical improvement now.  Prednisone, will need prolonged taper and maintenance prednisone. Chest x-ray showed right basilar pneumonia, completed 5 days of antibiotic therapy. Continue nebs and Dulera. COVID-19, RSV and influenza were negative.  Respiratory virus panel was positive for rhinovirus. Continue all supportive care. With overall poor prognosis, home with hospice was discussed and patient has agreed. 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 Arrangements at home being made for him to go home with home hospice program.  Depression -Continue sertraline and as needed Xanax.  Apparently patient has not been taking any medications for the last 2 weeks as he ran out of them.  Will  prescribe on discharge.  Goal of care discussion: Patient with advanced COPD, severe debilitating condition, cachexia.  Stabilizing with aggressive treatment.  Currently continuing all supportive treatment for COPD. Patient not currently at end-of-life, however with severe COPD and frailty reaching end-of-life. Does not qualify to go to in-house hospice, patient understands this. He is trying to coordinate living arrangement with his uncle and sister to go home.    DVT prophylaxis: Comfort care Code Status: Comfort care Family Communication: None at bedside.  Sister on the phone 4/28. Disposition Plan: Status is: Severe systemic disease.  Safe disposition plan pending.  Consultants: Palliative care  Procedures: None  Antimicrobials: Rocephin and Zithromax from 10/08/2022-4/26   Subjective:  Patient seen and examined.  He wants some muscle rub for the legs.  Breathing is okay at rest, he gets out of breath on mobility.  Does not like the nasal cannula oxygen.  Wants to see if he can use a mask.   Objective: Vitals:   10/15/22 1912 10/16/22 0739 10/16/22 0828 10/16/22 0834  BP: 122/85 (!) 133/95    Pulse: (!) 108 98 86   Resp: 17 18 18    Temp: 98.2 F (36.8 C)     TempSrc:      SpO2: 100% 100% 99% 99%  Weight:        Intake/Output Summary (Last 24 hours) at 10/16/2022 1159 Last data filed at 10/16/2022 1100 Gross per 24 hour  Intake 600 ml  Output 2300 ml  Net -1700 ml    Filed Weights   10/10/22 1811 10/11/22 0318 10/12/22 0434  Weight: 45.8 kg 44.1 kg 45 kg    Examination:  General: Cachectic.  Frail and debilitated.  Comfortable at rest.  Dyspneic on attempting to complete sentence. Cardiovascular: S1-S2  normal regular rate rhythm. Respiratory: Good air entry bilateral.  Not in any distress at rest.  On minimal oxygen.  Usually puts it in his mouth. Gastrointestinal: Soft.  Nontender.  Bowel sound present. Ext: No edema or swelling. Neuro: Alert and awake.   Oriented.     Data Reviewed: I have personally reviewed following labs and imaging studies  CBC: No results for input(s): "WBC", "NEUTROABS", "HGB", "HCT", "MCV", "PLT" in the last 168 hours.  Basic Metabolic Panel: No results for input(s): "NA", "K", "CL", "CO2", "GLUCOSE", "BUN", "CREATININE", "CALCIUM", "MG", "PHOS" in the last 168 hours.  GFR: Estimated Creatinine Clearance: 59.5 mL/min (by C-G formula based on SCr of 0.83 mg/dL). Liver Function Tests: No results for input(s): "AST", "ALT", "ALKPHOS", "BILITOT", "PROT", "ALBUMIN" in the last 168 hours.  No results for input(s): "LIPASE", "AMYLASE" in the last 168 hours. No results for input(s): "AMMONIA" in the last 168 hours. Coagulation Profile: No results for input(s): "INR", "PROTIME" in the last 168 hours. Cardiac Enzymes: No results for input(s): "CKTOTAL", "CKMB", "CKMBINDEX", "TROPONINI" in the last 168 hours. BNP (last 3 results) No results for input(s): "PROBNP" in the last 8760 hours. HbA1C: No results for input(s): "HGBA1C" in the last 72 hours. CBG: No results for input(s): "GLUCAP" in the last 168 hours. Lipid Profile: No results for input(s): "CHOL", "HDL", "LDLCALC", "TRIG", "CHOLHDL", "LDLDIRECT" in the last 72 hours. Thyroid Function Tests: No results for input(s): "TSH", "T4TOTAL", "FREET4", "T3FREE", "THYROIDAB" in the last 72 hours. Anemia Panel: No results for input(s): "VITAMINB12", "FOLATE", "FERRITIN", "TIBC", "IRON", "RETICCTPCT" in the last 72 hours. Sepsis Labs: Recent Labs  Lab 10/10/22 0224  PROCALCITON 0.12     Recent Results (from the past 240 hour(s))  SARS Coronavirus 2 by RT PCR (hospital order, performed in [facility] hospital lab) *cepheid single result test* Anterior Nasal Swab     Status: None   Collection Time: 10/07/22  8:15 AM   Specimen: Anterior Nasal Swab  Result Value Ref Range Status   SARS Coronavirus 2 by RT PCR NEGATIVE NEGATIVE Final    Comment:  (NOTE) SARS-CoV-2 target nucleic acids are NOT DETECTED.  The SARS-CoV-2 RNA is generally detectable in upper and lower respiratory specimens during the acute phase of infection. The lowest concentration of SARS-CoV-2 viral copies this assay can detect is 250 copies / mL. A negative result does not preclude SARS-CoV-2 infection and should not be used as the sole basis for treatment or other patient management decisions.  A negative result may occur with improper specimen collection / handling, submission of specimen other than nasopharyngeal swab, presence of viral mutation(s) within the areas targeted by this assay, and inadequate number of viral copies (<250 copies / mL). A negative result must be combined with clinical observations, patient history, and epidemiological information.  Fact Sheet for Patients:   RoadLapTop.co.za  Fact Sheet for Healthcare Providers: http://kim-miller.com/  This test is not yet approved or  cleared by the [place] FDA and has been authorized for detection and/or diagnosis of SARS-CoV-2 by FDA under an Emergency Use Authorization (EUA).  This EUA will remain in effect (meaning this test can be used) for the duration of the COVID-19 declaration under Section 564(b)(1) of the Act, 21 U.S.C. section 360bbb-3(b)(1), unless the authorization is terminated or revoked sooner.  Performed at [facility], [address], [place], [place] [address]   Respiratory (~20 pathogens) panel by PCR     Status: Abnormal   Collection Time: 10/08/22  5:48 PM   Specimen: Nasopharyngeal Swab; Respiratory  Result Value Ref Range Status   Adenovirus NOT DETECTED NOT DETECTED Final   Coronavirus 229E NOT DETECTED NOT DETECTED Final    Comment: (NOTE) The Coronavirus on the Respiratory Panel, DOES NOT test for the novel  Coronavirus (2019 nCoV)    Coronavirus HKU1 NOT DETECTED NOT DETECTED Final    Coronavirus NL63 NOT DETECTED NOT DETECTED Final   Coronavirus OC43 NOT DETECTED NOT DETECTED Final   Metapneumovirus NOT DETECTED NOT DETECTED Final   Rhinovirus / Enterovirus DETECTED (A) NOT DETECTED Final   Influenza A NOT DETECTED NOT DETECTED Final   Influenza B NOT DETECTED NOT DETECTED Final   Parainfluenza Virus 1 NOT DETECTED NOT DETECTED Final   Parainfluenza Virus 2 NOT DETECTED NOT DETECTED Final   Parainfluenza Virus 3 NOT DETECTED NOT DETECTED Final   Parainfluenza Virus 4 NOT DETECTED NOT DETECTED Final   Respiratory Syncytial Virus NOT DETECTED NOT DETECTED Final   Bordetella pertussis NOT DETECTED NOT DETECTED Final   Bordetella Parapertussis NOT DETECTED NOT DETECTED Final   Chlamydophila pneumoniae NOT DETECTED NOT DETECTED Final   Mycoplasma pneumoniae NOT DETECTED NOT DETECTED Final    Comment: Performed at [facility], [address], [place], [place] [address]  Resp panel by RT-PCR (RSV, Flu A&B, Covid) Anterior Nasal Swab     Status: None   Collection Time: 10/08/22  5:48 PM   Specimen: Anterior Nasal Swab  Result Value Ref Range Status   SARS Coronavirus 2 by RT PCR NEGATIVE NEGATIVE Final   Influenza A by PCR NEGATIVE NEGATIVE Final   Influenza B by PCR NEGATIVE NEGATIVE Final    Comment: (NOTE) The Xpert Xpress SARS-CoV-2/FLU/RSV plus assay is intended as an aid in the diagnosis of influenza from Nasopharyngeal swab specimens and should not be used as a sole basis for treatment. Nasal washings and aspirates are unacceptable for Xpert Xpress SARS-CoV-2/FLU/RSV testing.  Fact Sheet for Patients: BloggerCourse.com  Fact Sheet for Healthcare Providers: SeriousBroker.it  This test is not yet approved or cleared by the [place] FDA and has been authorized for detection and/or diagnosis of SARS-CoV-2 by FDA under an Emergency Use Authorization (EUA). This EUA will remain in effect (meaning this  test can be used) for the duration of the COVID-19 declaration under Section 564(b)(1) of the Act, 21 U.S.C. section 360bbb-3(b)(1), unless the authorization is terminated or revoked.     Resp Syncytial Virus by PCR NEGATIVE NEGATIVE Final    Comment: (NOTE) Fact Sheet for Patients: BloggerCourse.com  Fact Sheet for Healthcare Providers: SeriousBroker.it  This test is not yet approved or cleared by the [place] FDA and has been authorized for detection and/or diagnosis of SARS-CoV-2 by FDA under an Emergency Use Authorization (EUA). This EUA will remain in effect (meaning this test can be used) for the duration of the COVID-19 declaration under Section 564(b)(1) of the Act, 21 U.S.C. section 360bbb-3(b)(1), unless the authorization is terminated or revoked.  Performed at [facility], [address] [place], [place] [address]          Radiology Studies: No results found.      Scheduled Meds:  antiseptic oral rinse  15 mL Topical BID   guaiFENesin  1,200 mg Oral BID   ipratropium-albuterol  3 mL Nebulization BID   mometasone-formoterol  2 puff Inhalation BID   predniSONE  40 mg Oral Q breakfast   sertraline  50 mg Oral Daily  Continuous Infusions:      Total time spent: 35 minutes    Dorcas Carrow, MD [facility] 10/16/2022, 11:59 AM

## 2022-10-16 NOTE — Progress Notes (Signed)
Daily Progress Note   Patient Name: Jaime Cline       Date: 10/16/2022 DOB: 04-13-1961  Age: 62 y.o. MRN#: [id number] Attending Physician: Dorcas Carrow, MD Primary Care Physician: Patient, [name] Admit Date: 10/08/2022  Reason for Consultation/Follow-up: {Reason for Consult:23484}  Subjective: I have reviewed medical records including EPIC notes, MAR, and labs. Received report from primary RN - ***  All questions and concerns addressed. Encouraged to call with questions and/or concerns. PMT card provided.  Length of Stay: 7  Current Medications: Scheduled Meds:   antiseptic oral rinse  15 mL Topical BID   guaiFENesin  1,200 mg Oral BID   mometasone-formoterol  2 puff Inhalation BID   predniSONE  40 mg Oral Q breakfast   sertraline  50 mg Oral Daily    Continuous Infusions:   PRN Meds: acetaminophen **OR** acetaminophen, albuterol, diphenhydrAMINE, feeding supplement, glycopyrrolate **OR** glycopyrrolate **OR** glycopyrrolate, haloperidol **OR** haloperidol **OR** haloperidol lactate, ipratropium-albuterol, LORazepam **OR** LORazepam, morphine injection, ondansetron **OR** ondansetron (ZOFRAN) IV, polyvinyl alcohol, traZODone  Physical Exam          Vital Signs: BP (!) 133/95 (BP Location: Right Arm)   Pulse 86   Temp 98.2 F (36.8 C)   Resp 18   Wt 45 kg   SpO2 99%   BMI 14.23 kg/m  SpO2: SpO2: 99 % O2 Device: O2 Device: Nasal Cannula O2 Flow Rate: O2 Flow Rate (L/min): 5 L/min  Intake/output summary:  Intake/Output Summary (Last 24 hours) at 10/16/2022 0950 Last data filed at 10/16/2022 0800 Gross per 24 hour  Intake 240 ml  Output 2050 ml  Net -1810 ml   LBM: Last BM Date : 10/13/22 Baseline Weight: Weight: 45.8 kg Most recent weight: Weight: 45 kg        Palliative Assessment/Data:      Patient Active Problem List   Diagnosis Date Noted   AKI (acute kidney injury) (HCC) 08/18/2022   Metabolic acidosis, increased anion gap 08/18/2022   Depression 08/18/2022   Dyspepsia 08/18/2022   Sepsis due to pneumonia (HCC) 08/17/2022   Dyspnea 04/12/2022   Palliative care encounter 04/11/2022   Goals of care, counseling/discussion 04/11/2022   Counseling and coordination of care 04/11/2022   COPD with acute exacerbation (HCC) 04/10/2022   Syncope 09/28/2021   Protein-calorie malnutrition, severe 09/27/2021  Hyponatremia    Hypotension due to hypovolemia    Syncope and collapse 09/26/2021   CAP (community acquired pneumonia)    COPD exacerbation (HCC) 08/27/2021   Asthma, chronic 12/31/2012   Smoker 12/31/2012   Knee pain, bilateral 12/31/2012   Iron deficiency anemia 12/31/2012   Knee pain, chronic 12/31/2012    Palliative Care Assessment & Plan   Patient Profile: ***  Assessment: Principal Problem:   COPD exacerbation (HCC)   Recommendations/Plan: ***  Goals of Care and Additional Recommendations: Limitations on Scope of Treatment: {Recommended Scope and Preferences:21019}  Code Status:    Code Status Orders  (From admission, onward)           Start     Ordered   10/12/22 1041  Do not attempt resuscitation (DNR)  Continuous       Question Answer Comment  If patient has no pulse and is not breathing Do Not Attempt Resuscitation   If patient has a pulse and/or is breathing: Medical Treatment Goals COMFORT MEASURES: Keep clean/warm/dry, use medication by any route; positioning, wound care and other measures to relieve pain/suffering; use oxygen, suction/manual treatment of airway obstruction for comfort; do not transfer unless for comfort needs.   Consent: Discussion documented in EHR or advanced directives reviewed      10/12/22 1045           Code Status History     Date Active Date Inactive Code Status  Order ID Comments User Context   10/08/2022 1718 10/12/2022 1045 DNR [id number]  Glade Lloyd, MD ED   08/17/2022 1342 08/20/2022 2329 DNR [id number]  Steffanie Rainwater, MD ED   04/11/2022 0955 04/20/2022 0006 DNR [id number]  Alena Bills, DO Inpatient   04/10/2022 2200 04/11/2022 0954 Full Code [id number]  Dorcas Carrow, MD Inpatient   01/24/2022 2347 01/27/2022 2033 Full Code [id number]  Eduard Clos, MD ED   09/26/2021 0437 10/03/2021 2043 Full Code [id number]  Marolyn Haller, MD ED   08/27/2021 1447 08/29/2021 1948 Full Code [id number]  Steffanie Rainwater, MD ED   03/15/2012 0317 03/15/2012 1159 Full Code [id number]  Schinlever, Arie Sabina, PA ED       Prognosis:  {Palliative Care Prognosis:23504}  Discharge Planning: {Palliative dispostion:23505}  Care plan was discussed with ***  Thank you for allowing the Palliative Medicine Team to assist in the care of this patient.   Time In: *** Time Out: *** Total Time *** Prolonged Time Billed  {YES NO:22349}       Greater than 50%  of this time was spent counseling and coordinating care related to the above assessment and plan.  Haskel Khan, NP  Please contact Palliative Medicine Team phone at [phone] for questions and concerns.   *Portions of this note are a verbal dictation therefore any spelling and/or grammatical errors are due to the "Dragon Medical One" system interpretation.

## 2022-10-16 NOTE — Progress Notes (Addendum)
Civil engineer, contracting [facility])    Per discussion with [facility] IDT (TOC/Susan & MD/Dr. Jerral Ralph), plan is for patient to discharge with son on 4.30 via private vehicle. Per TOC, O2 is already in the home and will not need to be ordered by [facility] staff.   If applicable, please send signed and completed DNR with patient/family upon discharge. Please provide prescriptions at discharge as needed to ensure ongoing symptom management and a transport packet.   [facility] information and contact numbers given to family and above information shared with TOC.    Addendum: Plan for discharge is for patient to be followed by [facility] services once medically stable for DC. Above plan discussed with TOC/Susan and MD/Dr. Jerral Ralph via Epic Chat  Please call with any questions/concerns.    Thank you for the opportunity to participate in this patient's care   Eugenie Birks, MSW [facility] Liaison  [phone]

## 2022-10-17 DIAGNOSIS — J441 Chronic obstructive pulmonary disease with (acute) exacerbation: Secondary | ICD-10-CM | POA: Diagnosis not present

## 2022-10-17 MED ORDER — SERTRALINE HCL 50 MG PO TABS: 50.0000 mg | ORAL_TABLET | Freq: Every day | ORAL | 0 refills | Status: DC

## 2022-10-17 MED ORDER — LORAZEPAM 1 MG PO TABS: 1.0000 mg | ORAL_TABLET | ORAL | 0 refills | Status: DC | PRN

## 2022-10-17 MED ORDER — TRELEGY ELLIPTA 200-62.5-25 MCG/ACT IN AEPB: 1.0000 | INHALATION_SPRAY | Freq: Every day | RESPIRATORY_TRACT | 0 refills | Status: DC

## 2022-10-17 MED ORDER — PREDNISONE 10 MG PO TABS: ORAL_TABLET | ORAL | 0 refills | Status: DC

## 2022-10-17 MED ORDER — MORPHINE SULFATE 20 MG/5ML PO SOLN: 10.0000 mg | ORAL | 0 refills | Status: DC | PRN

## 2022-10-17 MED ORDER — ALBUTEROL SULFATE (2.5 MG/3ML) 0.083% IN NEBU: 2.5000 mg | INHALATION_SOLUTION | Freq: Three times a day (TID) | RESPIRATORY_TRACT | 12 refills | Status: DC | PRN

## 2022-10-17 MED ORDER — TRAZODONE HCL 50 MG PO TABS: 50.0000 mg | ORAL_TABLET | Freq: Every evening | ORAL | Status: AC | PRN

## 2022-10-17 NOTE — TOC Progression Note (Addendum)
Transition of Care [facility]) - Progression Note    Patient Details  Name: Jaime Cline MRN: [id number] Date of Birth: 02-05-1961  Transition of Care [facility]) CM/SW Contact  Lawerance Sabal, RN Phone Number: 10/17/2022, 10:09 AM  Clinical Narrative:     LVM and texted HIPAA appropriate message to son's number (obtained from yesterday's TOC note).  Awaiting response.   Spoke w patient at bedside.  He confirms that he will go his son [name]'s house at [address] [place] [address] [address] He confirms that he does not have an oxygen concentrator at that location. I will reach out to [facility] to let them know so one can be ordered.  Patient called [employer] at [phone]   I spoke w Reshawn and confirmed that he can transport the patient home, we discussed that he will likely be discharged later today.   [facility] has ordered O2, it will be delivered to his home, his son will bring tank with him for discharge.   13:30 due to brother in law providing transportation home later tonight after [facility] office hours, I have requested [employer] to bring O2 tank for transport to the room in case one is needed.   16:00 Verified oxygen has been delivered at the home by [facility]   Expected Discharge Plan: Home w Home Health Services Barriers to Discharge: Continued Medical Work up  Expected Discharge Plan and Services In-house Referral: Hospice / Palliative Care Discharge Planning Services: CM Consult Post Acute Care Choice: Home Health Living arrangements for the past 2 months: Apartment                 DME Arranged: N/A DME Agency: NA       HH Arranged: PT HH Agency: [facility] Date HH Agency Contacted: 10/13/22 Time HH Agency Contacted: 1120 Representative spoke with at [facility] Agency: Ephriam Knuckles   Social Determinants of Health (SDOH) Interventions SDOH Screenings   Food Insecurity: No Food Insecurity (10/09/2022)  Housing: Low Risk  (10/09/2022)  Transportation Needs: No Transportation Needs (10/09/2022)   Utilities: Not At Risk (10/09/2022)  Tobacco Use: High Risk (10/07/2022)    Readmission Risk Interventions    04/12/2022    4:02 PM 01/27/2022   12:27 PM  Readmission Risk Prevention Plan  Transportation Screening Complete Complete  PCP or Specialist Appt within 5-7 Days Complete Not Complete  Not Complete comments  new PCP made for 8/28  Home Care Screening Complete Complete  Medication Review (RN CM) Complete Complete

## 2022-10-17 NOTE — Discharge Summary (Signed)
Physician Discharge Summary  Jaime Cline WUJ:[id number] DOB: 11-Oct-1960 DOA: 10/08/2022  PCP: Patient, [name]  Admit date: 10/08/2022 Discharge date: 10/17/2022  Admitted From: Home Disposition: Home with home hospice  Recommendations for Outpatient Follow-up:  As per outpatient hospice provider  Home Health: N/A Equipment/Devices: Oxygen, hospital bed and equipments to be supported by hospice.  Discharge Condition: Fair CODE STATUS: DNR/comfort care Diet recommendation: Regular diet, nutritional supplements  Discharge summary: 62 y.o. 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  On presentation, he initially needed 10 L oxygen via nasal cannula. Chest x-ray showed possible new patchy right basilar opacity, suspicious for pneumonia. He was started on Rocephin, azithromycin and also IV Solu-Medrol was given.  Remained in poor clinical status. Shared decision making 4/25, decided to pursue hospice support system. 4/26, patient does not qualify for inpatient hospice placement.  Remain in the hospital for hospice arrangements at home.  Today he is medically stable to transition home.  Assessment & plan of care:   COPD with acute exacerbation, possible community-acquired right basilar bacterial pneumonia, possible exacerbation due to rhinovirus infection. Acute on chronic respiratory failure with hypoxia Ongoing smoker. Severe malnutrition/failure to thrive   Some clinical improvement now.  Prednisone, will need prolonged taper and maintenance prednisone.  Will prescribe prolonged taper and continue prednisone 10 mg daily indefinite. Chest x-ray showed right basilar pneumonia, completed 5 days of antibiotic therapy. Continue  nebs and Dulera. COVID-19, RSV and influenza were negative.  Respiratory virus panel was positive for rhinovirus. With overall poor prognosis and symptom burden, decided to go home with home hospice support. Patient will be prescribed additional symptom control medication including morphine for shortness of breath and air hunger, Ativan for anxiety and shortness of breath.  He will continue steroid inhalers, and nebulizers at home. Further symptom management and plan of care as per hospice at home.    Depression -Continue sertraline and as needed Xanax.  Apparently patient has not been taking any medications for the last 2 weeks as he ran out of them.  Prescribed on discharge.   Stable for discharge home with family.  Discharge Diagnoses:  Principal Problem:   COPD exacerbation [facility])    Discharge Instructions  Discharge Instructions     Diet general   Complete by: As directed    Discharge wound care:   Complete by: As directed    Apply protective dressings both legs   Increase activity slowly   Complete by: As directed       Allergies as of 10/17/2022       Reactions   Other Other (See Comments)   BP med from [facility] about 3 years ago, caused some seizures, due to getting wrong BP med at that time.  Unknown name of med        Medication List     STOP taking these medications    atorvastatin 20 MG tablet Commonly known as: LIPITOR   azithromycin 250 MG tablet Commonly known as: ZITHROMAX   guaiFENesin-dextromethorphan 100-10 MG/5ML syrup Commonly known as: ROBITUSSIN DM   predniSONE 10 MG (21) Tbpk tablet Commonly known as: STERAPRED UNI-PAK 21 TAB Replaced by: predniSONE 10 MG tablet       TAKE these medications    acetaminophen 500 MG tablet Commonly known  as: TYLENOL Take 1,000 mg by mouth every 6 (six) hours as needed for mild pain.   BC Fast Pain Relief 650-195-33.3 MG Pack Generic drug: Aspirin-Salicylamide-Caffeine Take 1 Package by mouth  daily as needed (pain).   LORazepam 1 MG tablet Commonly known as: ATIVAN Take 1 tablet (1 mg total) by mouth every 4 (four) hours as needed for anxiety, seizure or sleep (distress).   morphine 20 MG/5ML solution Take 2.5 mLs (10 mg total) by mouth every 4 (four) hours as needed for pain (shortness of breath, air hunger).   predniSONE 10 MG tablet Commonly known as: DELTASONE 3 tabs daily for 3 days 2 tabs daily for 3 days 1 tab daily for 30 days Start taking on: Oct 18, 2022 Replaces: predniSONE 10 MG (21) Tbpk tablet   sertraline 50 MG tablet Commonly known as: ZOLOFT Take 1 tablet (50 mg total) by mouth daily.   traZODone 50 MG tablet Commonly known as: DESYREL Take 1 tablet (50 mg total) by mouth at bedtime as needed for sleep.   Trelegy Ellipta 200-62.5-25 MCG/ACT Aepb Generic drug: Fluticasone-Umeclidin-Vilant Inhale 1 Inhalation into the lungs daily.   Ventolin HFA 108 (90 Base) MCG/ACT inhaler Generic drug: albuterol Inhale 1-2 puffs into the lungs every 6 (six) hours as needed for wheezing.   albuterol (2.5 MG/3ML) 0.083% nebulizer solution Commonly known as: PROVENTIL Take 3 mLs (2.5 mg total) by nebulization every 8 (eight) hours as needed for wheezing or shortness of breath.               Discharge Care Instructions  (From admission, onward)           Start     Ordered   10/17/22 0000  Discharge wound care:       Comments: Apply protective dressings both legs   10/17/22 1105            Follow-up Information     [facility] Follow up.   Specialty: Hospice and Palliative Medicine Contact information: [address] [place] [address] [phone]               Allergies  Allergen Reactions   Other Other (See Comments)    BP med from [facility] about 3 years ago, caused some seizures, due to getting wrong BP med at that time.  Unknown name of med    Consultations: Palliative  care   Procedures/Studies: DG Chest 2 View  Result Date: 10/08/2022 CLINICAL DATA:  Shortness of breath EXAM: CHEST - 2 VIEW COMPARISON:  10/07/2022 FINDINGS: The heart size and mediastinal contours are within normal limits. Hyperinflated, hyperlucent lungs. New patchy right basilar opacity. Left lung is clear. No pleural effusion or pneumothorax. The visualized skeletal structures are unremarkable. IMPRESSION: 1. New patchy right basilar opacity, suspicious for pneumonia. 2. COPD. Electronically Signed   By: Duanne Guess D.O.   On: 10/08/2022 14:29   DG Chest Port 1 View  Result Date: 10/07/2022 CLINICAL DATA:  coughj EXAM: PORTABLE CHEST - 1 VIEW COMPARISON:  09/27/2022 FINDINGS: Pulmonary hyperinflation with attenuated bronchovascular markings as before. No focal infiltrate or overt edema. Heart size and mediastinal contours are within normal limits. Aortic Atherosclerosis (ICD10-170.0). No effusion. Visualized bones unremarkable. IMPRESSION: Pulmonary hyperinflation without acute findings. Electronically Signed   By: Corlis Leak M.D.   On: 10/07/2022 08:50   DG Chest 1 View  Result Date: 09/27/2022 CLINICAL DATA:  Chest pain EXAM: CHEST  1 VIEW COMPARISON:  08/17/2022 and prior studies FINDINGS:  Cardiomediastinal silhouette is unremarkable. Emphysema changes are again noted. Previously identified nodular opacities overlying the LOWER LEFT lung are less conspicuous on this study. There is no evidence of focal airspace disease, pulmonary edema, mass, pleural effusion, or pneumothorax. No acute bony abnormalities are identified. IMPRESSION: 1. No evidence of acute cardiopulmonary disease. 2. Emphysema. Electronically Signed   By: Harmon Pier M.D.   On: 09/27/2022 19:00   (Echo, Carotid, EGD, Colonoscopy, ERCP)    Subjective: Patient seen and examined.  Denies any complaints.  Still has some difficulty breathing with mobility but he knows this is his baseline.  Patient tells me he is ready to go  home and his son will pick him up.  He is aware about plan for hospice follow-up at home.  He does not want to keep coming to the hospital.   Discharge Exam: Vitals:   10/17/22 0741 10/17/22 0741  BP:  114/77  Pulse: 88 95  Resp: 18 17  Temp:    SpO2: 100% 100%   Vitals:   10/16/22 2226 10/17/22 0610 10/17/22 0741 10/17/22 0741  BP: 124/86 103/79  114/77  Pulse: 95 82 88 95  Resp: 18 16 18 17   Temp: 98.2 F (36.8 C) 98.1 F (36.7 C)    TempSrc: Oral     SpO2: 100% 100% 100% 100%  Weight:        General: Pt is alert, awake, chronically sick looking.  Frail and debilitated.  Cachectic. Cardiovascular: RRR, S1/S2 +, no rubs, no gallops Respiratory: Scattered expiratory wheezes.  Looks comfortable on room air today. Abdominal: Soft, NT, ND, bowel sounds + Extremities: no edema, no cyanosis    The results of significant diagnostics from this hospitalization (including imaging, microbiology, ancillary and laboratory) are listed below for reference.     Microbiology: Recent Results (from the past 240 hour(s))  Respiratory (~20 pathogens) panel by PCR     Status: Abnormal   Collection Time: 10/08/22  5:48 PM   Specimen: Nasopharyngeal Swab; Respiratory  Result Value Ref Range Status   Adenovirus NOT DETECTED NOT DETECTED Final   Coronavirus 229E NOT DETECTED NOT DETECTED Final    Comment: (NOTE) The Coronavirus on the Respiratory Panel, DOES NOT test for the novel  Coronavirus (2019 nCoV)    Coronavirus HKU1 NOT DETECTED NOT DETECTED Final   Coronavirus NL63 NOT DETECTED NOT DETECTED Final   Coronavirus OC43 NOT DETECTED NOT DETECTED Final   Metapneumovirus NOT DETECTED NOT DETECTED Final   Rhinovirus / Enterovirus DETECTED (A) NOT DETECTED Final   Influenza A NOT DETECTED NOT DETECTED Final   Influenza B NOT DETECTED NOT DETECTED Final   Parainfluenza Virus 1 NOT DETECTED NOT DETECTED Final   Parainfluenza Virus 2 NOT DETECTED NOT DETECTED Final   Parainfluenza Virus  3 NOT DETECTED NOT DETECTED Final   Parainfluenza Virus 4 NOT DETECTED NOT DETECTED Final   Respiratory Syncytial Virus NOT DETECTED NOT DETECTED Final   Bordetella pertussis NOT DETECTED NOT DETECTED Final   Bordetella Parapertussis NOT DETECTED NOT DETECTED Final   Chlamydophila pneumoniae NOT DETECTED NOT DETECTED Final   Mycoplasma pneumoniae NOT DETECTED NOT DETECTED Final    Comment: Performed at [facility], [address] [place], [place] [address]  Resp panel by RT-PCR (RSV, Flu A&B, Covid) Anterior Nasal Swab     Status: None   Collection Time: 10/08/22  5:48 PM   Specimen: Anterior Nasal Swab  Result Value Ref Range Status   SARS Coronavirus 2 by  RT PCR NEGATIVE NEGATIVE Final   Influenza A by PCR NEGATIVE NEGATIVE Final   Influenza B by PCR NEGATIVE NEGATIVE Final    Comment: (NOTE) The Xpert Xpress SARS-CoV-2/FLU/RSV plus assay is intended as an aid in the diagnosis of influenza from Nasopharyngeal swab specimens and should not be used as a sole basis for treatment. Nasal washings and aspirates are unacceptable for Xpert Xpress SARS-CoV-2/FLU/RSV testing.  Fact Sheet for Patients: BloggerCourse.com  Fact Sheet for Healthcare Providers: SeriousBroker.it  This test is not yet approved or cleared by the [place] FDA and has been authorized for detection and/or diagnosis of SARS-CoV-2 by FDA under an Emergency Use Authorization (EUA). This EUA will remain in effect (meaning this test can be used) for the duration of the COVID-19 declaration under Section 564(b)(1) of the Act, 21 U.S.C. section 360bbb-3(b)(1), unless the authorization is terminated or revoked.     Resp Syncytial Virus by PCR NEGATIVE NEGATIVE Final    Comment: (NOTE) Fact Sheet for Patients: BloggerCourse.com  Fact Sheet for Healthcare Providers: SeriousBroker.it  This test is not yet  approved or cleared by the [place] FDA and has been authorized for detection and/or diagnosis of SARS-CoV-2 by FDA under an Emergency Use Authorization (EUA). This EUA will remain in effect (meaning this test can be used) for the duration of the COVID-19 declaration under Section 564(b)(1) of the Act, 21 U.S.C. section 360bbb-3(b)(1), unless the authorization is terminated or revoked.  Performed at [facility], [address], [place], [place] [address]      Labs: BNP (last 3 results) Recent Labs    08/17/22 1540 10/07/22 0815 10/08/22 1400  BNP 37.2 60.4 109.6*   Basic Metabolic Panel: No results for input(s): "NA", "K", "CL", "CO2", "GLUCOSE", "BUN", "CREATININE", "CALCIUM", "MG", "PHOS" in the last 168 hours. Liver Function Tests: No results for input(s): "AST", "ALT", "ALKPHOS", "BILITOT", "PROT", "ALBUMIN" in the last 168 hours. No results for input(s): "LIPASE", "AMYLASE" in the last 168 hours. No results for input(s): "AMMONIA" in the last 168 hours. CBC: No results for input(s): "WBC", "NEUTROABS", "HGB", "HCT", "MCV", "PLT" in the last 168 hours. Cardiac Enzymes: No results for input(s): "CKTOTAL", "CKMB", "CKMBINDEX", "TROPONINI" in the last 168 hours. BNP: Invalid input(s): "POCBNP" CBG: No results for input(s): "GLUCAP" in the last 168 hours. D-Dimer No results for input(s): "DDIMER" in the last 72 hours. Hgb A1c No results for input(s): "HGBA1C" in the last 72 hours. Lipid Profile No results for input(s): "CHOL", "HDL", "LDLCALC", "TRIG", "CHOLHDL", "LDLDIRECT" in the last 72 hours. Thyroid function studies No results for input(s): "TSH", "T4TOTAL", "T3FREE", "THYROIDAB" in the last 72 hours.  Invalid input(s): "FREET3" Anemia work up No results for input(s): "VITAMINB12", "FOLATE", "FERRITIN", "TIBC", "IRON", "RETICCTPCT" in the last 72 hours. Urinalysis    Component Value Date/Time   COLORURINE YELLOW 08/17/2022 1252   APPEARANCEUR CLEAR  08/17/2022 1252   LABSPEC <1.005 (L) 08/17/2022 1252   PHURINE 6.0 08/17/2022 1252   GLUCOSEU NEGATIVE 08/17/2022 1252   HGBUR NEGATIVE 08/17/2022 1252   BILIRUBINUR NEGATIVE 08/17/2022 1252   KETONESUR NEGATIVE 08/17/2022 1252   PROTEINUR NEGATIVE 08/17/2022 1252   UROBILINOGEN 0.2 03/27/2014 0158   NITRITE NEGATIVE 08/17/2022 1252   LEUKOCYTESUR NEGATIVE 08/17/2022 1252   Sepsis Labs No results for input(s): "WBC" in the last 168 hours.  Invalid input(s): "PROCALCITONIN", "LACTICIDVEN" Microbiology Recent Results (from the past 240 hour(s))  Respiratory (~20 pathogens) panel by PCR     Status: Abnormal   Collection  Time: 10/08/22  5:48 PM   Specimen: Nasopharyngeal Swab; Respiratory  Result Value Ref Range Status   Adenovirus NOT DETECTED NOT DETECTED Final   Coronavirus 229E NOT DETECTED NOT DETECTED Final    Comment: (NOTE) The Coronavirus on the Respiratory Panel, DOES NOT test for the novel  Coronavirus (2019 nCoV)    Coronavirus HKU1 NOT DETECTED NOT DETECTED Final   Coronavirus NL63 NOT DETECTED NOT DETECTED Final   Coronavirus OC43 NOT DETECTED NOT DETECTED Final   Metapneumovirus NOT DETECTED NOT DETECTED Final   Rhinovirus / Enterovirus DETECTED (A) NOT DETECTED Final   Influenza A NOT DETECTED NOT DETECTED Final   Influenza B NOT DETECTED NOT DETECTED Final   Parainfluenza Virus 1 NOT DETECTED NOT DETECTED Final   Parainfluenza Virus 2 NOT DETECTED NOT DETECTED Final   Parainfluenza Virus 3 NOT DETECTED NOT DETECTED Final   Parainfluenza Virus 4 NOT DETECTED NOT DETECTED Final   Respiratory Syncytial Virus NOT DETECTED NOT DETECTED Final   Bordetella pertussis NOT DETECTED NOT DETECTED Final   Bordetella Parapertussis NOT DETECTED NOT DETECTED Final   Chlamydophila pneumoniae NOT DETECTED NOT DETECTED Final   Mycoplasma pneumoniae NOT DETECTED NOT DETECTED Final    Comment: Performed at [facility], [address] [place], [place] [address]  Resp panel  by RT-PCR (RSV, Flu A&B, Covid) Anterior Nasal Swab     Status: None   Collection Time: 10/08/22  5:48 PM   Specimen: Anterior Nasal Swab  Result Value Ref Range Status   SARS Coronavirus 2 by RT PCR NEGATIVE NEGATIVE Final   Influenza A by PCR NEGATIVE NEGATIVE Final   Influenza B by PCR NEGATIVE NEGATIVE Final    Comment: (NOTE) The Xpert Xpress SARS-CoV-2/FLU/RSV plus assay is intended as an aid in the diagnosis of influenza from Nasopharyngeal swab specimens and should not be used as a sole basis for treatment. Nasal washings and aspirates are unacceptable for Xpert Xpress SARS-CoV-2/FLU/RSV testing.  Fact Sheet for Patients: BloggerCourse.com  Fact Sheet for Healthcare Providers: SeriousBroker.it  This test is not yet approved or cleared by the [place] FDA and has been authorized for detection and/or diagnosis of SARS-CoV-2 by FDA under an Emergency Use Authorization (EUA). This EUA will remain in effect (meaning this test can be used) for the duration of the COVID-19 declaration under Section 564(b)(1) of the Act, 21 U.S.C. section 360bbb-3(b)(1), unless the authorization is terminated or revoked.     Resp Syncytial Virus by PCR NEGATIVE NEGATIVE Final    Comment: (NOTE) Fact Sheet for Patients: BloggerCourse.com  Fact Sheet for Healthcare Providers: SeriousBroker.it  This test is not yet approved or cleared by the [place] FDA and has been authorized for detection and/or diagnosis of SARS-CoV-2 by FDA under an Emergency Use Authorization (EUA). This EUA will remain in effect (meaning this test can be used) for the duration of the COVID-19 declaration under Section 564(b)(1) of the Act, 21 U.S.C. section 360bbb-3(b)(1), unless the authorization is terminated or revoked.  Performed at [facility], [address] [place], [place] [address]       Time coordinating discharge:  35 minutes  SIGNED:   Dorcas Carrow, MD  [facility] 10/17/2022, 1:16 PM

## 2022-10-17 NOTE — Progress Notes (Signed)
Pt has DC order to home with Hospice. Initially son will pick-up the pt but due to work, brother-in-law will pick-up the pt tonight. Case manager was informed, called the son and verify all the details. Reminded son to make sure to inform the ride to bring the portabe O2 to be delivered at home. AVS was given to pt, all questions were answered.

## 2022-10-31 ENCOUNTER — Encounter (HOSPITAL_COMMUNITY): Payer: Self-pay | Admitting: Emergency Medicine

## 2022-10-31 ENCOUNTER — Emergency Department (HOSPITAL_COMMUNITY)
Admission: EM | Admit: 2022-10-31 | Discharge: 2022-11-01 | Disposition: A | Discharge status: Home / Self Care | Payer: Medicaid Other | Attending: Emergency Medicine | Admitting: Emergency Medicine

## 2022-10-31 ENCOUNTER — Emergency Department (HOSPITAL_COMMUNITY): Payer: Medicaid Other

## 2022-10-31 ENCOUNTER — Other Ambulatory Visit: Payer: Self-pay

## 2022-10-31 DIAGNOSIS — Z7952 Long term (current) use of systemic steroids: Secondary | ICD-10-CM | POA: Insufficient documentation

## 2022-10-31 DIAGNOSIS — R0602 Shortness of breath: Secondary | ICD-10-CM | POA: Diagnosis present

## 2022-10-31 DIAGNOSIS — J45909 Unspecified asthma, uncomplicated: Secondary | ICD-10-CM | POA: Insufficient documentation

## 2022-10-31 DIAGNOSIS — J441 Chronic obstructive pulmonary disease with (acute) exacerbation: Secondary | ICD-10-CM | POA: Diagnosis not present

## 2022-10-31 DIAGNOSIS — Z79899 Other long term (current) drug therapy: Secondary | ICD-10-CM | POA: Insufficient documentation

## 2022-10-31 DIAGNOSIS — F17210 Nicotine dependence, cigarettes, uncomplicated: Secondary | ICD-10-CM | POA: Insufficient documentation

## 2022-10-31 DIAGNOSIS — R531 Weakness: Secondary | ICD-10-CM | POA: Diagnosis not present

## 2022-10-31 DIAGNOSIS — R64 Cachexia: Secondary | ICD-10-CM | POA: Insufficient documentation

## 2022-10-31 DIAGNOSIS — I10 Essential (primary) hypertension: Secondary | ICD-10-CM | POA: Diagnosis not present

## 2022-10-31 HISTORY — DX: Chronic obstructive pulmonary disease, unspecified: J44.9

## 2022-10-31 NOTE — ED Triage Notes (Signed)
Pt BIB [facility] from home. Pt c/o SOB for past day. Hx COPD. Pt used inhaler multiple times without relief. Pt On 5L  at baseline. Lung sounds diminished in lower lobes. And wheezing in upper lobes. 125mg  solumedrol administered by ems. Pt reports feeling better after 2nd duoneb treatment. Upon ems arrival pt had labored respirations. Breathing normalized after first duoneb. Pt also c/o left sided weakness and reports falling over tha past few days  128/87 95HR 100% on Duoneb 20RR 195Cbg

## 2022-10-31 NOTE — ED Provider Notes (Signed)
[facility]-EMERGENCY DEPT Provider Note: Jaime Dell, MD, FACEP  CSN: [id number] MRN: [id number] ARRIVAL: 10/31/22 at 2219 ROOM: WA23/WA23   CHIEF COMPLAINT  Shortness of Breath   HISTORY OF PRESENT ILLNESS  10/31/22 10:57 PM Jaime Cline is a 62 y.o. male with a history of COPD.  He has had shortness of breath for the past day.  He has been using his inhaler multiple times without relief.  He is on oxygen 5 L by nasal cannula at baseline.  EMS found him to have wheezing in his upper lobes and diminished sound in his lower lobes.  He was administered 125 mg of Solu-Medrol IV and was given 2 DuoNeb treatments with significant improvement.   He also states that he has been weak and on the left side of his body for the past several days.  This is caused him to fall multiple times.  He already has chronic bilateral knee pain which makes ambulation difficult.  He rates his knee pain as a 10 out of 10.   Past Medical History:  Diagnosis Date   Alcohol abuse    Asthma    Chronic back pain    Chronic knee pain    COPD (chronic obstructive pulmonary disease) (HCC)    Hypertension     No past surgical history on file.  Family History  Problem Relation Age of Onset   Cancer Mother    Cancer Sister     Social History   Tobacco Use   Smoking status: Every Day    Packs/day: 1    Types: Cigarettes  Substance Use Topics   Alcohol use: Yes    Alcohol/week: 28.0 standard drinks of alcohol    Types: 28 Cans of beer per week   Drug use: No    Prior to Admission medications   Medication Sig Start Date End Date Taking? Authorizing Provider  acetaminophen (TYLENOL) 500 MG tablet Take 1,000 mg by mouth every 6 (six) hours as needed for mild pain. Patient not taking: Reported on 10/09/2022    [provider]  albuterol (PROVENTIL) (2.5 MG/3ML) 0.083% nebulizer solution Take 3 mLs (2.5 mg total) by nebulization every 8 (eight) hours as needed for wheezing or shortness of breath.  10/17/22   Dorcas Carrow, MD  albuterol (VENTOLIN HFA) 108 (90 Base) MCG/ACT inhaler Inhale 1-2 puffs into the lungs every 6 (six) hours as needed for wheezing. Patient not taking: Reported on 10/09/2022 01/27/22   Dimple Nanas, MD  Aspirin-Salicylamide-Caffeine (BC FAST PAIN RELIEF) [phone] MG PACK Take 1 Package by mouth daily as needed (pain). Patient not taking: Reported on 10/09/2022    [provider]  Fluticasone-Umeclidin-Vilant (TRELEGY ELLIPTA) 200-62.5-25 MCG/ACT AEPB Inhale 1 Inhalation into the lungs daily. 10/17/22   Dorcas Carrow, MD  LORazepam (ATIVAN) 1 MG tablet Take 1 tablet (1 mg total) by mouth every 4 (four) hours as needed for anxiety, seizure or sleep (distress). 10/17/22   Dorcas Carrow, MD  morphine 20 MG/5ML solution Take 2.5 mLs (10 mg total) by mouth every 4 (four) hours as needed for pain (shortness of breath, air hunger). 10/17/22   Dorcas Carrow, MD  predniSONE (DELTASONE) 10 MG tablet 3 tabs daily for 3 days 2 tabs daily for 3 days 1 tab daily for 30 days 10/18/22   Dorcas Carrow, MD  sertraline (ZOLOFT) 50 MG tablet Take 1 tablet (50 mg total) by mouth daily. 10/17/22   Dorcas Carrow, MD  traZODone (DESYREL) 50 MG tablet Take 1  tablet (50 mg total) by mouth at bedtime as needed for sleep. 10/17/22 11/16/22  Dorcas Carrow, MD  albuterol (PROVENTIL,VENTOLIN) 90 MCG/ACT inhaler Inhale 2 puffs into the lungs every 6 (six) hours as needed. Shortness of breath and wheezing   03/22/12  [provider]    Allergies Other   REVIEW OF SYSTEMS  Negative except as noted here or in the History of Present Illness.   PHYSICAL EXAMINATION  Initial Vital Signs Blood pressure (!) 138/99, pulse 98, temperature 98 F (36.7 C), temperature source Oral, resp. rate 17, SpO2 100 %.  Examination General: Well-developed, cachectic male in no acute distress; appearance consistent with age of record HENT: normocephalic; atraumatic Eyes: pupils equal, round  and reactive to light; extraocular muscles intact Neck: supple Heart: regular rate and rhythm Lungs: clear to auscultation bilaterally Abdomen: soft; nondistended; nontender; bowel sounds present Extremities: No deformity; full range of motion; pulses normal Neurologic: Awake, alert and oriented; motor function intact in all extremities; decreased sensation on left side of body; mild left facial droop Skin: Warm and dry Psychiatric: Normal mood and affect   RESULTS  Summary of this visit's results, reviewed and interpreted by myself:   EKG Interpretation  Date/Time:  Tuesday Oct 31 2022 22:32:36 EDT Ventricular Rate:  99 PR Interval:  130 QRS Duration: 84 QT Interval:  365 QTC Calculation: 469 R Axis:   89 Text Interpretation: Sinus rhythm Right atrial enlargement Borderline right axis deviation Minimal ST elevation, anterior leads Confirmed by Maleik Vanderzee ([id number]) on 10/31/2022 10:50:57 PM       Laboratory Studies: No results found for this or any previous visit (from the past 24 hour(s)). Imaging Studies: No results found.  ED COURSE and MDM  Nursing notes, initial and subsequent vitals signs, including pulse oximetry, reviewed and interpreted by myself.  Vitals:   10/31/22 2232  BP: (!) 138/99  Pulse: 98  Resp: 17  Temp: 98 F (36.7 C)  TempSrc: Oral  SpO2: 100%   Medications - No data to display    PROCEDURES  Procedures   ED DIAGNOSES  No diagnosis found.

## 2022-11-01 LAB — BASIC METABOLIC PANEL
Anion gap: 13 (ref 5–15)
BUN: 10 mg/dL (ref 8–23)
CO2: 20 mmol/L — ABNORMAL LOW (ref 22–32)
Calcium: 8.7 mg/dL — ABNORMAL LOW (ref 8.9–10.3)
Chloride: 101 mmol/L (ref 98–111)
Creatinine, Ser: 0.67 mg/dL (ref 0.61–1.24)
GFR, Estimated: >60 mL/min (ref >60)
Glucose, Bld: 128 mg/dL — ABNORMAL HIGH (ref 70–99)
Potassium: 3.6 mmol/L (ref 3.5–5.1)
Sodium: 134 mmol/L — ABNORMAL LOW (ref 135–145)

## 2022-11-01 LAB — CBC WITH DIFFERENTIAL/PLATELET
Abs Immature Granulocytes: 0.04 10*3/uL (ref 0.00–0.07)
Basophils Absolute: 0 10*3/uL (ref 0.0–0.1)
Basophils Relative: 0 %
Eosinophils Absolute: 0 10*3/uL (ref 0.0–0.5)
Eosinophils Relative: 0 %
HCT: 35.2 % — ABNORMAL LOW (ref 39.0–52.0)
Hemoglobin: 12.2 g/dL — ABNORMAL LOW (ref 13.0–17.0)
Immature Granulocytes: 1 %
Lymphocytes Relative: 4 %
Lymphs Abs: 0.2 10*3/uL — ABNORMAL LOW (ref 0.7–4.0)
MCH: 32.8 pg (ref 26.0–34.0)
MCHC: 34.7 g/dL (ref 30.0–36.0)
MCV: 94.6 fL (ref 80.0–100.0)
Monocytes Absolute: 0.2 10*3/uL (ref 0.1–1.0)
Monocytes Relative: 3 %
Neutro Abs: 5 10*3/uL (ref 1.7–7.7)
Neutrophils Relative %: 92 %
Platelets: 154 10*3/uL (ref 150–400)
RBC: 3.72 MIL/uL — ABNORMAL LOW (ref 4.22–5.81)
RDW: 15.9 % — ABNORMAL HIGH (ref 11.5–15.5)
WBC: 5.4 10*3/uL (ref 4.0–10.5)
nRBC: 0 % (ref 0.0–0.2)

## 2022-11-01 LAB — HEPATIC FUNCTION PANEL
ALT: 22 U/L (ref 0–44)
AST: 30 U/L (ref 15–41)
Albumin: 3.8 g/dL (ref 3.5–5.0)
Alkaline Phosphatase: 45 U/L (ref 38–126)
Bilirubin, Direct: 0.2 mg/dL (ref 0.0–0.2)
Indirect Bilirubin: 0.8 mg/dL (ref 0.3–0.9)
Total Bilirubin: 1 mg/dL (ref 0.3–1.2)
Total Protein: 7.3 g/dL (ref 6.5–8.1)

## 2022-11-01 MED ORDER — IPRATROPIUM-ALBUTEROL 0.5-2.5 (3) MG/3ML IN SOLN
3.0000 mL | Freq: Once | RESPIRATORY_TRACT | Status: AC
  Administered 2022-11-01: 3 mL via RESPIRATORY_TRACT
  Filled 2022-11-01: qty 3

## 2022-11-01 NOTE — ED Notes (Signed)
Ptar Transport setup for pt

## 2022-11-01 NOTE — ED Notes (Signed)
Pt passed swallow screen

## 2023-06-26 ENCOUNTER — Emergency Department (HOSPITAL_COMMUNITY): Payer: Medicaid Other

## 2023-06-26 ENCOUNTER — Inpatient Hospital Stay (HOSPITAL_COMMUNITY)
Admission: EM | Admit: 2023-06-26 | Discharge: 2023-07-01 | DRG: 190 | Disposition: A | Discharge status: Hospice | Payer: Medicaid Other | Attending: Internal Medicine | Admitting: Internal Medicine

## 2023-06-26 ENCOUNTER — Other Ambulatory Visit: Payer: Self-pay

## 2023-06-26 DIAGNOSIS — F419 Anxiety disorder, unspecified: Secondary | ICD-10-CM | POA: Diagnosis present

## 2023-06-26 DIAGNOSIS — Z87891 Personal history of nicotine dependence: Secondary | ICD-10-CM

## 2023-06-26 DIAGNOSIS — F1910 Other psychoactive substance abuse, uncomplicated: Secondary | ICD-10-CM

## 2023-06-26 DIAGNOSIS — E872 Acidosis, unspecified: Secondary | ICD-10-CM | POA: Diagnosis present

## 2023-06-26 DIAGNOSIS — Z681 Body mass index (BMI) 19 or less, adult: Secondary | ICD-10-CM

## 2023-06-26 DIAGNOSIS — Z79899 Other long term (current) drug therapy: Secondary | ICD-10-CM

## 2023-06-26 DIAGNOSIS — I21A1 Myocardial infarction type 2: Secondary | ICD-10-CM | POA: Diagnosis present

## 2023-06-26 DIAGNOSIS — Z515 Encounter for palliative care: Secondary | ICD-10-CM

## 2023-06-26 DIAGNOSIS — Z1152 Encounter for screening for COVID-19: Secondary | ICD-10-CM

## 2023-06-26 DIAGNOSIS — F32A Depression, unspecified: Secondary | ICD-10-CM | POA: Diagnosis present

## 2023-06-26 DIAGNOSIS — R079 Chest pain, unspecified: Secondary | ICD-10-CM

## 2023-06-26 DIAGNOSIS — I10 Essential (primary) hypertension: Secondary | ICD-10-CM | POA: Diagnosis present

## 2023-06-26 DIAGNOSIS — Y906 Blood alcohol level of 120-199 mg/100 ml: Secondary | ICD-10-CM | POA: Diagnosis present

## 2023-06-26 DIAGNOSIS — E43 Unspecified severe protein-calorie malnutrition: Secondary | ICD-10-CM | POA: Diagnosis present

## 2023-06-26 DIAGNOSIS — R0600 Dyspnea, unspecified: Principal | ICD-10-CM

## 2023-06-26 DIAGNOSIS — Z7951 Long term (current) use of inhaled steroids: Secondary | ICD-10-CM

## 2023-06-26 DIAGNOSIS — F10129 Alcohol abuse with intoxication, unspecified: Secondary | ICD-10-CM | POA: Diagnosis present

## 2023-06-26 DIAGNOSIS — J441 Chronic obstructive pulmonary disease with (acute) exacerbation: Principal | ICD-10-CM | POA: Diagnosis present

## 2023-06-26 DIAGNOSIS — Z66 Do not resuscitate: Secondary | ICD-10-CM | POA: Diagnosis present

## 2023-06-26 DIAGNOSIS — J9611 Chronic respiratory failure with hypoxia: Secondary | ICD-10-CM | POA: Diagnosis present

## 2023-06-26 DIAGNOSIS — E871 Hypo-osmolality and hyponatremia: Secondary | ICD-10-CM | POA: Diagnosis present

## 2023-06-26 DIAGNOSIS — Z9981 Dependence on supplemental oxygen: Secondary | ICD-10-CM

## 2023-06-26 DIAGNOSIS — D649 Anemia, unspecified: Secondary | ICD-10-CM | POA: Diagnosis present

## 2023-06-26 DIAGNOSIS — F10939 Alcohol use, unspecified with withdrawal, unspecified: Secondary | ICD-10-CM

## 2023-06-26 LAB — RAPID URINE DRUG SCREEN, HOSP PERFORMED
Amphetamines: NOT DETECTED
Barbiturates: NOT DETECTED
Benzodiazepines: NOT DETECTED
Cocaine: NOT DETECTED
Opiates: POSITIVE — AB
Tetrahydrocannabinol: POSITIVE — AB

## 2023-06-26 LAB — URINALYSIS, ROUTINE W REFLEX MICROSCOPIC
Bilirubin Urine: NEGATIVE
Glucose, UA: NEGATIVE mg/dL
Hgb urine dipstick: NEGATIVE
Ketones, ur: NEGATIVE mg/dL
Leukocytes,Ua: NEGATIVE
Nitrite: NEGATIVE
Protein, ur: NEGATIVE mg/dL
Specific Gravity, Urine: 1.015 (ref 1.005–1.030)
pH: 6 (ref 5.0–8.0)

## 2023-06-26 LAB — COMPREHENSIVE METABOLIC PANEL
ALT: 16 U/L (ref 0–44)
AST: 36 U/L (ref 15–41)
Albumin: 4 g/dL (ref 3.5–5.0)
Alkaline Phosphatase: 56 U/L (ref 38–126)
Anion gap: 12 (ref 5–15)
BUN: 13 mg/dL (ref 8–23)
CO2: 31 mmol/L (ref 22–32)
Calcium: 8.7 mg/dL — ABNORMAL LOW (ref 8.9–10.3)
Chloride: 91 mmol/L — ABNORMAL LOW (ref 98–111)
Creatinine, Ser: 0.55 mg/dL — ABNORMAL LOW (ref 0.61–1.24)
GFR, Estimated: >60 mL/min (ref >60)
Glucose, Bld: 79 mg/dL (ref 70–99)
Potassium: 4.5 mmol/L (ref 3.5–5.1)
Sodium: 134 mmol/L — ABNORMAL LOW (ref 135–145)
Total Bilirubin: 0.6 mg/dL (ref 0.0–1.2)
Total Protein: 6.8 g/dL (ref 6.5–8.1)

## 2023-06-26 LAB — RESP PANEL BY RT-PCR (RSV, FLU A&B, COVID)  RVPGX2
Influenza A by PCR: NEGATIVE
Influenza B by PCR: NEGATIVE
Resp Syncytial Virus by PCR: NEGATIVE
SARS Coronavirus 2 by RT PCR: NEGATIVE

## 2023-06-26 LAB — TROPONIN I (HIGH SENSITIVITY): Troponin I (High Sensitivity): 10 ng/L (ref <18)

## 2023-06-26 LAB — BRAIN NATRIURETIC PEPTIDE: B Natriuretic Peptide: 60.6 pg/mL (ref 0.0–100.0)

## 2023-06-26 LAB — CBC WITH DIFFERENTIAL/PLATELET
Abs Immature Granulocytes: 0.01 10*3/uL (ref 0.00–0.07)
Basophils Absolute: 0 10*3/uL (ref 0.0–0.1)
Basophils Relative: 1 %
Eosinophils Absolute: 0.1 10*3/uL (ref 0.0–0.5)
Eosinophils Relative: 2 %
HCT: 31.9 % — ABNORMAL LOW (ref 39.0–52.0)
Hemoglobin: 10.6 g/dL — ABNORMAL LOW (ref 13.0–17.0)
Immature Granulocytes: 0 %
Lymphocytes Relative: 46 %
Lymphs Abs: 2.1 10*3/uL (ref 0.7–4.0)
MCH: 32 pg (ref 26.0–34.0)
MCHC: 33.2 g/dL (ref 30.0–36.0)
MCV: 96.4 fL (ref 80.0–100.0)
Monocytes Absolute: 0.4 10*3/uL (ref 0.1–1.0)
Monocytes Relative: 8 %
Neutro Abs: 2 10*3/uL (ref 1.7–7.7)
Neutrophils Relative %: 43 %
Platelets: 275 10*3/uL (ref 150–400)
RBC: 3.31 MIL/uL — ABNORMAL LOW (ref 4.22–5.81)
RDW: 14.9 % (ref 11.5–15.5)
WBC: 4.6 10*3/uL (ref 4.0–10.5)
nRBC: 0 % (ref 0.0–0.2)

## 2023-06-26 LAB — I-STAT CG4 LACTIC ACID, ED
Lactic Acid, Venous: 2.9 mmol/L (ref 0.5–1.9)
Lactic Acid, Venous: 8 mmol/L (ref 0.5–1.9)
Lactic Acid, Venous: 8.1 mmol/L (ref 0.5–1.9)
Lactic Acid, Venous: 8.6 mmol/L (ref 0.5–1.9)

## 2023-06-26 LAB — LACTIC ACID, PLASMA: Lactic Acid, Venous: 8 mmol/L (ref 0.5–1.9)

## 2023-06-26 LAB — ETHANOL: Alcohol, Ethyl (B): 121 mg/dL — ABNORMAL HIGH (ref <10)

## 2023-06-26 MED ORDER — SODIUM CHLORIDE 0.9 % IV BOLUS
500.0000 mL | Freq: Once | INTRAVENOUS | Status: AC
  Administered 2023-06-26: 500 mL via INTRAVENOUS

## 2023-06-26 MED ORDER — IPRATROPIUM-ALBUTEROL 0.5-2.5 (3) MG/3ML IN SOLN
3.0000 mL | Freq: Once | RESPIRATORY_TRACT | Status: AC
  Administered 2023-06-26: 3 mL via RESPIRATORY_TRACT
  Filled 2023-06-26: qty 3

## 2023-06-26 MED ORDER — SODIUM CHLORIDE 0.9 % IV BOLUS
1000.0000 mL | Freq: Once | INTRAVENOUS | Status: AC
  Administered 2023-06-27: 1000 mL via INTRAVENOUS

## 2023-06-26 NOTE — ED Provider Notes (Signed)
 [place] EMERGENCY DEPARTMENT AT [facility] Provider Note   CSN: [id number] Arrival date & time: 06/26/23  1541     History  Chief Complaint  Patient presents with   Shortness of Breath    Jaime Cline is a 63 y.o. male.  63 year old male with prior medical history as detailed below presents for evaluation.  Patient arrives from [facility].  Patient is currently in hospice care.  Patient with DNR/DNI.  Patient with apparent increased shortness of breath per EMS.  EMS administered 2 DuoNebs, 125 mg Solu-Medrol , 2 g of magnesium .  Patient is on 3 L nasal cannula O2 at baseline.  On my evaluation the patient appears improved.  He denies any specific complaint.  He denies chest pain or current shortness of breath.  He reports that he feels much improved after EMS treatment.  The history is provided by the patient and medical records.       Home Medications Prior to Admission medications   Medication Sig Start Date End Date Taking? Authorizing Provider  albuterol  (PROVENTIL ) (2.5 MG/3ML) 0.083% nebulizer solution Take 3 mLs (2.5 mg total) by nebulization every 8 (eight) hours as needed for wheezing or shortness of breath. 10/17/22   Raenelle Coria, MD  albuterol  (VENTOLIN  HFA) 108 (90 Base) MCG/ACT inhaler Inhale 1-2 puffs into the lungs every 6 (six) hours as needed for wheezing. Patient not taking: Reported on 10/09/2022 01/27/22   Caleen Burgess BROCKS, MD  Fluticasone -Umeclidin-Vilant (TRELEGY ELLIPTA ) 200-62.5-25 MCG/ACT AEPB Inhale 1 Inhalation into the lungs daily. 10/17/22   Raenelle Coria, MD  LORazepam  (ATIVAN ) 1 MG tablet Take 1 tablet (1 mg total) by mouth every 4 (four) hours as needed for anxiety, seizure or sleep (distress). 10/17/22   Raenelle Coria, MD  morphine  20 MG/5ML solution Take 2.5 mLs (10 mg total) by mouth every 4 (four) hours as needed for pain (shortness of breath, air hunger). 10/17/22   Raenelle Coria, MD  predniSONE  (DELTASONE ) 10 MG tablet 3 tabs  daily for 3 days 2 tabs daily for 3 days 1 tab daily for 30 days 10/18/22   Raenelle Coria, MD  sertraline  (ZOLOFT ) 50 MG tablet Take 1 tablet (50 mg total) by mouth daily. 10/17/22   Raenelle Coria, MD  traZODone  (DESYREL ) 50 MG tablet Take 1 tablet (50 mg total) by mouth at bedtime as needed for sleep. 10/17/22 11/16/22  Raenelle Coria, MD  albuterol  (PROVENTIL ,VENTOLIN ) 90 MCG/ACT inhaler Inhale 2 puffs into the lungs every 6 (six) hours as needed. Shortness of breath and wheezing   03/22/12  [provider]      Allergies    Other    Review of Systems   Review of Systems  All other systems reviewed and are negative.   Physical Exam Updated Vital Signs BP 98/72   Pulse 82   Temp 98.3 F (36.8 C) (Oral)   Resp (!) 21   SpO2 100%  Physical Exam Vitals and nursing note reviewed.  Constitutional:      General: He is not in acute distress.    Appearance: Normal appearance. He is well-developed.  HENT:     Head: Normocephalic and atraumatic.  Eyes:     Conjunctiva/sclera: Conjunctivae normal.     Pupils: Pupils are equal, round, and reactive to light.  Cardiovascular:     Rate and Rhythm: Normal rate and regular rhythm.     Heart sounds: Normal heart sounds.  Pulmonary:     Effort: Pulmonary effort is normal. No  respiratory distress.     Breath sounds: Normal breath sounds.  Abdominal:     General: There is no distension.     Palpations: Abdomen is soft.     Tenderness: There is no abdominal tenderness.  Musculoskeletal:        General: No deformity. Normal range of motion.     Cervical back: Normal range of motion and neck supple.  Skin:    General: Skin is warm and dry.  Neurological:     General: No focal deficit present.     Mental Status: He is alert.     ED Results / Procedures / Treatments   Labs (all labs ordered are listed, but only abnormal results are displayed) Labs Reviewed  RESP PANEL BY RT-PCR (RSV, FLU A&B, COVID)  [id number]  CBC WITH  DIFFERENTIAL/PLATELET  COMPREHENSIVE METABOLIC PANEL  ETHANOL  BRAIN NATRIURETIC PEPTIDE  URINALYSIS, ROUTINE W REFLEX MICROSCOPIC  RAPID URINE DRUG SCREEN, HOSP PERFORMED  I-STAT CG4 LACTIC ACID, ED  TROPONIN I (HIGH SENSITIVITY)    EKG EKG Interpretation Date/Time:  Tuesday June 26 2023 15:54:53 EST Ventricular Rate:  84 PR Interval:  141 QRS Duration:  92 QT Interval:  410 QTC Calculation: 485 R Axis:   95  Text Interpretation: Sinus rhythm Right axis deviation Abnormal R-wave progression, late transition Abnormal T, consider ischemia, diffuse leads Confirmed by Laurice Coy [phone]) on 06/26/2023 3:57:09 PM  Radiology No results found.  Procedures Procedures    Medications Ordered in ED Medications - No data to display  ED Course/ Medical Decision Making/ A&P Clinical Course as of 06/28/23 [name] Jun 27, 2023  0126 Spoke with critical care, Dr. Layman.  States that lactates are likely related to his acute presentation.  No other ongoing concerns.  He has a chronically elevated lactate normally around 5.  Will repeat 1 additional time and ensure that this was drawn appropriately and placed on ice. [CH]  0420 Repeat lactate persistently elevated.  Patient remains clinically stable.  Will discuss with hospitalist. [CH]    Clinical Course User Index [CH] Horton, Charmaine FALCON, MD                                 Medical Decision Making Amount and/or Complexity of Data Reviewed Labs: ordered. Radiology: ordered.  Risk Prescription drug management. Decision regarding hospitalization.    Medical Screen Complete  This patient presented to the ED with complaint of shortness of breath.  This complaint involves an extensive number of treatment options. The initial differential diagnosis includes, but is not limited to, pneumonia, COPD exacerbation, metabolic abnormality, alcohol  intoxication, etc.  This presentation is: Acute, Chronic, Self-Limited, Previously  Undiagnosed, Uncertain Prognosis, Complicated, Systemic Symptoms, and Threat to Life/Bodily Function  Patient presents from his facility with apparent shortness of breath.  Patient is on 3 L nasal cannula O2 at baseline.  Patient with apparent reported increased shortness of breath.  Patient is DNR/Hospice Care.  Patient with known history of chronic alcohol  use.  EMS is also reporting likely recent substance use/abuse.  Patient's respiratory status seems to be remarkably improved after EMS transport and treatment.  Patient observed in the ED without evidence of worsening condition.  Notable findings on workup include an alcohol  level of 121.  Patient's toxicology screen is also positive for THC.  Chest x-ray is without acute abnormality.  COVID and flu testing is negative.  EKG with T wave  inversions.  Patient denies any chest pain.  Initial troponin was 10.  White blood cell count is 4.6.  Creatinine is 0.55.  Initial i-STAT lactic acid is 2.9.  IV fluids administered.  Patient appeared to be remarkably improved and was taking good p.o.  Patient was requesting discharge.  Repeat i-STAT lactic acid was obtained and reported 8.6.  This elevated lactic acid is inconsistent with  patient's remarkably improved clinical status.  Repeat lactic acid ordered and processed by main lab - it also resulted as 8.0.  Given confusing picture of elevated lactic acid and apparently improved clinical status I will err on the side of conservative management.  Hospitalist service contacted regarding need for admission/observation/continued workup.  Case also discussed with critical care.  Dr. Layman will evaluate the patient here at [facility] in the ED.      Co morbidities that complicated the patient's evaluation  COPD, alcohol  abuse   Additional history obtained:  Additional history obtained from EMS External records from outside sources obtained and reviewed including prior ED visits  and prior Inpatient records.    Lab Tests:  I ordered and personally interpreted labs.  The pertinent results include: CBC, CMP, troponin, BNP, COVID, flu, EtOH, lactic acid, urine toxicology   Imaging Studies ordered:  I ordered imaging studies including chest x-ray I independently visualized and interpreted obtained imaging which showed NAD I agree with the radiologist interpretation.   Cardiac Monitoring:  The patient was maintained on a cardiac monitor.  I personally viewed and interpreted the cardiac monitor which showed an underlying rhythm of: NSR   Medicines ordered:  I ordered medication including DuoNeb for wheezing Reevaluation of the patient after these medicines showed that the patient: improved    Problem List / ED Course:  Shortness of breath, alcohol  intoxication   Reevaluation:  After the interventions noted above, I reevaluated the patient and found that they have: improved   Disposition:  After consideration of the diagnostic results and the patients response to treatment, I feel that the patent would benefit from admission.   CRITICAL CARE Performed by: Maude JAYSON Galloway   Total critical care time: 30 minutes  Critical care time was exclusive of separately billable procedures and treating other patients.  Critical care was necessary to treat or prevent imminent or life-threatening deterioration.  Critical care was time spent personally by me on the following activities: development of treatment plan with patient and/or surrogate as well as nursing, discussions with consultants, evaluation of patient's response to treatment, examination of patient, obtaining history from patient or surrogate, ordering and performing treatments and interventions, ordering and review of laboratory studies, ordering and review of radiographic studies, pulse oximetry and re-evaluation of patient's condition.          Final Clinical Impression(s) / ED  Diagnoses Final diagnoses:  Dyspnea, unspecified type    Rx / DC Orders ED Discharge Orders     None         Galloway Maude JAYSON, MD 06/28/23 1935

## 2023-06-26 NOTE — ED Notes (Signed)
 Pt. Non-compliant with O2 via nasal cannula. Pt. Keeps putting nasal cannula into mouth.

## 2023-06-26 NOTE — ED Triage Notes (Signed)
 Pt. BIB EMS w/ c/o SOB. Pt. From SN[facility]. Pt. Is receiving hospice care. Per EMS pt. Lung sounds diminished x 4, duo-nebs x 2, 125 solumedrol x 1, 2 mg Mag given in route. Pt. Is on 3 liters of oxygen at baseline.   104/76 BP 70 HR 20 RR 99% O2 CBG 106

## 2023-06-27 ENCOUNTER — Observation Stay (HOSPITAL_COMMUNITY): Payer: Medicaid Other

## 2023-06-27 ENCOUNTER — Encounter (HOSPITAL_COMMUNITY): Payer: Self-pay | Admitting: Internal Medicine

## 2023-06-27 DIAGNOSIS — E872 Acidosis, unspecified: Secondary | ICD-10-CM

## 2023-06-27 DIAGNOSIS — J441 Chronic obstructive pulmonary disease with (acute) exacerbation: Secondary | ICD-10-CM

## 2023-06-27 DIAGNOSIS — F10939 Alcohol use, unspecified with withdrawal, unspecified: Secondary | ICD-10-CM

## 2023-06-27 DIAGNOSIS — R079 Chest pain, unspecified: Secondary | ICD-10-CM

## 2023-06-27 DIAGNOSIS — F1910 Other psychoactive substance abuse, uncomplicated: Secondary | ICD-10-CM

## 2023-06-27 DIAGNOSIS — D649 Anemia, unspecified: Secondary | ICD-10-CM

## 2023-06-27 LAB — BASIC METABOLIC PANEL
Anion gap: 12 (ref 5–15)
BUN: 14 mg/dL (ref 8–23)
CO2: 30 mmol/L (ref 22–32)
Calcium: 9.1 mg/dL (ref 8.9–10.3)
Chloride: 92 mmol/L — ABNORMAL LOW (ref 98–111)
Creatinine, Ser: 0.67 mg/dL (ref 0.61–1.24)
GFR, Estimated: >60 mL/min (ref >60)
Glucose, Bld: 104 mg/dL — ABNORMAL HIGH (ref 70–99)
Potassium: 4.2 mmol/L (ref 3.5–5.1)
Sodium: 134 mmol/L — ABNORMAL LOW (ref 135–145)

## 2023-06-27 LAB — LACTIC ACID, PLASMA
Lactic Acid, Venous: 9 mmol/L (ref 0.5–1.9)
Lactic Acid, Venous: 4.7 mmol/L (ref 0.5–1.9)

## 2023-06-27 LAB — ECHOCARDIOGRAM COMPLETE
AR max vel: 1.35 cm2
AV Area VTI: 1.8 cm2
AV Area mean vel: 1.38 cm2
AV Mean grad: 2 mm[Hg]
AV Peak grad: 3.5 mm[Hg]
Ao pk vel: 0.93 m/s
Area-P 1/2: 5.42 cm2
Calc EF: 62.3 %
Height: 70 in
MV VTI: 2.96 cm2
S' Lateral: 2.9 cm
Single Plane A2C EF: 61.2 %
Single Plane A4C EF: 62.1 %
Weight: 1472 [oz_av]

## 2023-06-27 LAB — CBC
HCT: 30.8 % — ABNORMAL LOW (ref 39.0–52.0)
Hemoglobin: 10.6 g/dL — ABNORMAL LOW (ref 13.0–17.0)
MCH: 32.2 pg (ref 26.0–34.0)
MCHC: 34.4 g/dL (ref 30.0–36.0)
MCV: 93.6 fL (ref 80.0–100.0)
Platelets: 236 10*3/uL (ref 150–400)
RBC: 3.29 MIL/uL — ABNORMAL LOW (ref 4.22–5.81)
RDW: 15 % (ref 11.5–15.5)
WBC: 9.8 10*3/uL (ref 4.0–10.5)
nRBC: 0 % (ref 0.0–0.2)

## 2023-06-27 LAB — MAGNESIUM: Magnesium: 2 mg/dL (ref 1.7–2.4)

## 2023-06-27 LAB — TROPONIN I (HIGH SENSITIVITY)
Troponin I (High Sensitivity): 318 ng/L (ref <18)
Troponin I (High Sensitivity): 662 ng/L (ref <18)

## 2023-06-27 LAB — HIV ANTIBODY (ROUTINE TESTING W REFLEX): HIV Screen 4th Generation wRfx: NONREACTIVE

## 2023-06-27 LAB — D-DIMER, QUANTITATIVE: D-Dimer, Quant: 2.45 ug{FEU}/mL — ABNORMAL HIGH (ref 0.00–0.50)

## 2023-06-27 MED ORDER — LORAZEPAM 1 MG PO TABS
1.0000 mg | ORAL_TABLET | ORAL | Status: DC | PRN
  Administered 2023-06-27 – 2023-06-28 (×2): 1 mg via ORAL
  Filled 2023-06-27 (×2): qty 1

## 2023-06-27 MED ORDER — ASPIRIN 81 MG PO CHEW
324.0000 mg | CHEWABLE_TABLET | Freq: Every day | ORAL | Status: DC
  Administered 2023-06-27 – 2023-06-29 (×3): 324 mg via ORAL
  Filled 2023-06-27 (×3): qty 4

## 2023-06-27 MED ORDER — HEPARIN BOLUS VIA INFUSION
2000.0000 [IU] | Freq: Once | INTRAVENOUS | Status: DC
  Filled 2023-06-27: qty 2000

## 2023-06-27 MED ORDER — THIAMINE HCL 100 MG/ML IJ SOLN
100.0000 mg | Freq: Every day | INTRAMUSCULAR | Status: DC
  Filled 2023-06-27 (×2): qty 2

## 2023-06-27 MED ORDER — FOLIC ACID 1 MG PO TABS
1.0000 mg | ORAL_TABLET | Freq: Every day | ORAL | Status: DC
  Administered 2023-06-27 – 2023-07-01 (×5): 1 mg via ORAL
  Filled 2023-06-27 (×5): qty 1

## 2023-06-27 MED ORDER — SERTRALINE HCL 50 MG PO TABS
50.0000 mg | ORAL_TABLET | Freq: Every day | ORAL | Status: DC
  Administered 2023-06-27 – 2023-07-01 (×5): 50 mg via ORAL
  Filled 2023-06-27 (×5): qty 1

## 2023-06-27 MED ORDER — BUDESONIDE 0.25 MG/2ML IN SUSP
0.2500 mg | Freq: Two times a day (BID) | RESPIRATORY_TRACT | Status: DC
  Administered 2023-06-27 – 2023-07-01 (×9): 0.25 mg via RESPIRATORY_TRACT
  Filled 2023-06-27 (×9): qty 2

## 2023-06-27 MED ORDER — ACETAMINOPHEN 650 MG RE SUPP: 650.0000 mg | Freq: Four times a day (QID) | RECTAL | Status: DC | PRN

## 2023-06-27 MED ORDER — METHYLPREDNISOLONE SODIUM SUCC 125 MG IJ SOLR
80.0000 mg | Freq: Two times a day (BID) | INTRAMUSCULAR | Status: DC
  Administered 2023-06-27 – 2023-06-29 (×4): 80 mg via INTRAVENOUS
  Filled 2023-06-27 (×4): qty 2

## 2023-06-27 MED ORDER — DOXYCYCLINE HYCLATE 100 MG PO TABS
100.0000 mg | ORAL_TABLET | Freq: Two times a day (BID) | ORAL | Status: DC
  Administered 2023-06-27 – 2023-07-01 (×9): 100 mg via ORAL
  Filled 2023-06-27 (×9): qty 1

## 2023-06-27 MED ORDER — TRAZODONE HCL 50 MG PO TABS
50.0000 mg | ORAL_TABLET | Freq: Every evening | ORAL | Status: DC | PRN
  Administered 2023-06-27 – 2023-06-30 (×4): 50 mg via ORAL
  Filled 2023-06-27 (×4): qty 1

## 2023-06-27 MED ORDER — ACETAMINOPHEN 325 MG PO TABS
650.0000 mg | ORAL_TABLET | Freq: Four times a day (QID) | ORAL | Status: DC | PRN
  Administered 2023-06-27 – 2023-06-29 (×2): 650 mg via ORAL
  Filled 2023-06-27 (×2): qty 2

## 2023-06-27 MED ORDER — PREDNISONE 20 MG PO TABS
60.0000 mg | ORAL_TABLET | Freq: Every day | ORAL | Status: DC
  Administered 2023-06-27: 60 mg via ORAL
  Filled 2023-06-27: qty 3

## 2023-06-27 MED ORDER — GABAPENTIN 400 MG PO CAPS
400.0000 mg | ORAL_CAPSULE | Freq: Three times a day (TID) | ORAL | Status: DC
  Administered 2023-06-27 – 2023-06-28 (×6): 400 mg via ORAL
  Filled 2023-06-27 (×6): qty 1

## 2023-06-27 MED ORDER — HEPARIN (PORCINE) 25000 UT/250ML-% IV SOLN
1050.0000 [IU]/h | INTRAVENOUS | Status: AC
  Administered 2023-06-27: 600 [IU]/h via INTRAVENOUS
  Administered 2023-06-29: 1050 [IU]/h via INTRAVENOUS
  Filled 2023-06-27 (×2): qty 250

## 2023-06-27 MED ORDER — ALBUTEROL SULFATE (2.5 MG/3ML) 0.083% IN NEBU
2.5000 mg | INHALATION_SOLUTION | RESPIRATORY_TRACT | Status: DC | PRN
  Administered 2023-06-27: 2.5 mg via RESPIRATORY_TRACT
  Filled 2023-06-27: qty 3

## 2023-06-27 MED ORDER — ENOXAPARIN SODIUM 40 MG/0.4ML IJ SOSY
40.0000 mg | PREFILLED_SYRINGE | INTRAMUSCULAR | Status: DC
  Administered 2023-06-27: 40 mg via SUBCUTANEOUS
  Filled 2023-06-27: qty 0.4

## 2023-06-27 MED ORDER — LORAZEPAM 2 MG/ML IJ SOLN
1.0000 mg | INTRAMUSCULAR | Status: DC | PRN
  Administered 2023-06-27: 2 mg via INTRAVENOUS
  Filled 2023-06-27 (×2): qty 1

## 2023-06-27 MED ORDER — NICOTINE 14 MG/24HR TD PT24
14.0000 mg | MEDICATED_PATCH | Freq: Every day | TRANSDERMAL | Status: DC
  Administered 2023-06-27 – 2023-07-01 (×5): 14 mg via TRANSDERMAL
  Filled 2023-06-27 (×5): qty 1

## 2023-06-27 MED ORDER — IOHEXOL 350 MG/ML SOLN
75.0000 mL | Freq: Once | INTRAVENOUS | Status: AC | PRN
  Administered 2023-06-27: 75 mL via INTRAVENOUS

## 2023-06-27 MED ORDER — IPRATROPIUM-ALBUTEROL 0.5-2.5 (3) MG/3ML IN SOLN
3.0000 mL | Freq: Four times a day (QID) | RESPIRATORY_TRACT | Status: DC
  Administered 2023-06-27 – 2023-06-28 (×7): 3 mL via RESPIRATORY_TRACT
  Filled 2023-06-27 (×7): qty 3

## 2023-06-27 MED ORDER — ENSURE ENLIVE PO LIQD
237.0000 mL | Freq: Two times a day (BID) | ORAL | Status: DC
  Administered 2023-06-27 – 2023-06-28 (×2): 237 mL via ORAL

## 2023-06-27 MED ORDER — THIAMINE MONONITRATE 100 MG PO TABS
100.0000 mg | ORAL_TABLET | Freq: Every day | ORAL | Status: DC
  Administered 2023-06-27 – 2023-07-01 (×5): 100 mg via ORAL
  Filled 2023-06-27 (×5): qty 1

## 2023-06-27 MED ORDER — ADULT MULTIVITAMIN W/MINERALS CH
1.0000 | ORAL_TABLET | Freq: Every day | ORAL | Status: DC
  Administered 2023-06-27 – 2023-07-01 (×5): 1 via ORAL
  Filled 2023-06-27 (×5): qty 1

## 2023-06-27 MED ORDER — HYDRALAZINE HCL 20 MG/ML IJ SOLN: 5.0000 mg | Freq: Four times a day (QID) | INTRAMUSCULAR | Status: DC | PRN

## 2023-06-27 MED ORDER — HEPARIN (PORCINE) 25000 UT/250ML-% IV SOLN: 600.0000 [IU]/h | INTRAVENOUS | Status: DC

## 2023-06-27 MED ORDER — MORPHINE SULFATE (CONCENTRATE) 10 MG /0.5 ML PO SOLN
5.0000 mg | Freq: Three times a day (TID) | ORAL | Status: DC | PRN
  Administered 2023-06-29 – 2023-07-01 (×3): 5 mg via ORAL
  Filled 2023-06-27 (×4): qty 0.5

## 2023-06-27 NOTE — H&P (Signed)
 History and Physical    [name] FMW:[id number] DOB: 04-05-1961 DOA: 06/26/2023  PCP: Patient, [name]  Patient coming from: SNF  Chief Complaint: Shortness of breath  HPI: Jaime Cline is a 63 y.o. male with medical history significant of advanced COPD transitioned to hospice care back in April 2024, hypertension, alcohol /polysubstance abuse, anxiety, depression, chronic back pain presenting to the ED via EMS from his nursing facility with shortness of breath.  On EMS arrival, patient had diminished breath sounds.  He was given DuoNeb x 2, Solu-Medrol  125 mg, and IV mag 1 g in route.  Patient is on 2-3 L oxygen at baseline.  On arrival to the ED, patient appeared improved.  No change in oxygen requirement from baseline.  Afebrile.  Labs showing no leukocytosis, hemoglobin 10.6, MCV 96.4, sodium 134 (chronically low and stable), chloride 91, bicarb 31, creatinine 0.5, ethanol level 121, COVID/influenza/RSV PCR negative, lactic acid 2.9> 8.1> 8.0> 8.6> 8.0> 9.0, UA without signs of infection, blood cultures collected, UDS positive for opiates and THC.  Chest x-ray not suggestive of pneumonia. EKG showing new ST depressions and T wave inversions in inferior leads and slight ST elevations in aVR and aVL. Initial troponin negative and repeat pending.  BNP normal.  Patient was given additional DuoNeb treatment and 1.5 L normal saline in the ED.  Given persistently elevated lactate, critical care was consulted and felt that the patient's elevated lactate was related to his acute presentation.  Critical care reviewed the patient's chart and felt that his lactate was chronically elevated.  Lactate was repeated in the ED and eventually trended down to 4.7.  Patient is a poor historian.  He reports having chronic shortness of breath due to COPD and uses 3 L of oxygen at baseline.  Reporting increasing shortness of breath and wheezing since yesterday.  Denies any cough.  Patient thinks he is receiving  albuterol  neb treatments at his facility but not Trelegy, pharmacy verification pending.  He is reporting left-sided lateral chest/rib pain.  Denies any falls or injury to his chest.  Denies fevers, nausea, vomiting, or abdominal pain.  He is not able to give any additional history.  Review of Systems:  Review of Systems  All other systems reviewed and are negative.   Past Medical History:  Diagnosis Date   Alcohol  abuse    Asthma    Chronic back pain    Chronic knee pain    COPD (chronic obstructive pulmonary disease) (HCC)    Hypertension     No past surgical history on file.   reports that he has been smoking cigarettes. He does not have any smokeless tobacco history on file. He reports current alcohol  use of about 28.0 standard drinks of alcohol  per week. He reports that he does not use drugs.  Allergies  Allergen Reactions   Other Other (See Comments)    BP med from [place] about 3 years ago, caused some seizures, due to getting wrong BP med at that time.  Unknown name of med    Family History  Problem Relation Age of Onset   Cancer Mother    Cancer Sister     Prior to Admission medications   Medication Sig Start Date End Date Taking? Authorizing Provider  albuterol  (PROVENTIL ) (2.5 MG/3ML) 0.083% nebulizer solution Take 3 mLs (2.5 mg total) by nebulization every 8 (eight) hours as needed for wheezing or shortness of breath. 10/17/22   Raenelle Coria, MD  albuterol  (VENTOLIN  HFA) 108 (  90 Base) MCG/ACT inhaler Inhale 1-2 puffs into the lungs every 6 (six) hours as needed for wheezing. Patient not taking: Reported on 10/09/2022 01/27/22   Caleen Burgess BROCKS, MD  Fluticasone -Umeclidin-Vilant (TRELEGY ELLIPTA ) 200-62.5-25 MCG/ACT AEPB Inhale 1 Inhalation into the lungs daily. 10/17/22   Raenelle Coria, MD  LORazepam  (ATIVAN ) 1 MG tablet Take 1 tablet (1 mg total) by mouth every 4 (four) hours as needed for anxiety, seizure or sleep (distress). 10/17/22   Raenelle Coria, MD  morphine  20  MG/5ML solution Take 2.5 mLs (10 mg total) by mouth every 4 (four) hours as needed for pain (shortness of breath, air hunger). 10/17/22   Raenelle Coria, MD  predniSONE  (DELTASONE ) 10 MG tablet 3 tabs daily for 3 days 2 tabs daily for 3 days 1 tab daily for 30 days 10/18/22   Raenelle Coria, MD  sertraline  (ZOLOFT ) 50 MG tablet Take 1 tablet (50 mg total) by mouth daily. 10/17/22   Ghimire, Kuber, MD  traZODone  (DESYREL ) 50 MG tablet Take 1 tablet (50 mg total) by mouth at bedtime as needed for sleep. 10/17/22 11/16/22  Ghimire, Kuber, MD  albuterol  (PROVENTIL ,VENTOLIN ) 90 MCG/ACT inhaler Inhale 2 puffs into the lungs every 6 (six) hours as needed. Shortness of breath and wheezing   03/22/12  [provider]    Physical Exam: Vitals:   06/26/23 2345 06/27/23 0000 06/27/23 0248 06/27/23 0354  BP: 107/64  (!) 144/95   Pulse: 100  (!) 101   Resp: 16  18   Temp:  98.1 F (36.7 C)  97.7 F (36.5 C)  TempSrc:    Oral  SpO2: 100%  100%   Weight:      Height:        Physical Exam Vitals reviewed.  Constitutional:      General: He is not in acute distress.    Comments: Appears cachectic Tremulous  HENT:     Head: Normocephalic and atraumatic.  Eyes:     Extraocular Movements: Extraocular movements intact.  Cardiovascular:     Rate and Rhythm: Normal rate and regular rhythm.     Pulses: Normal pulses.  Pulmonary:     Effort: Pulmonary effort is normal. No respiratory distress.     Breath sounds: No rales.     Comments: Diminished breath sounds with mild end expiratory wheezing Abdominal:     General: Bowel sounds are normal. There is no distension.     Palpations: Abdomen is soft.     Tenderness: There is no abdominal tenderness. There is no guarding.  Musculoskeletal:     Cervical back: Normal range of motion.     Right lower leg: No edema.     Left lower leg: No edema.  Skin:    General: Skin is warm and dry.  Neurological:     General: No focal deficit present.      Mental Status: He is alert and oriented to person, place, and time.     Labs on Admission: I have personally reviewed following labs and imaging studies  CBC: Recent Labs  Lab 06/26/23 1610  WBC 4.6  NEUTROABS 2.0  HGB 10.6*  HCT 31.9*  MCV 96.4  PLT 275   Basic Metabolic Panel: Recent Labs  Lab 06/26/23 1610  NA 134*  K 4.5  CL 91*  CO2 31  GLUCOSE 79  BUN 13  CREATININE 0.55*  CALCIUM  8.7*   GFR: Estimated Creatinine Clearance: 56.5 mL/min (A) (by C-G formula based on SCr of 0.55 mg/dL (  L)). Liver Function Tests: Recent Labs  Lab 06/26/23 1610  AST 36  ALT 16  ALKPHOS 56  BILITOT 0.6  PROT 6.8  ALBUMIN 4.0   No results for input(s): LIPASE, AMYLASE in the last 168 hours. No results for input(s): AMMONIA in the last 168 hours. Coagulation Profile: No results for input(s): INR, PROTIME in the last 168 hours. Cardiac Enzymes: No results for input(s): CKTOTAL, CKMB, CKMBINDEX, TROPONINI in the last 168 hours. BNP (last 3 results) No results for input(s): PROBNP in the last 8760 hours. HbA1C: No results for input(s): HGBA1C in the last 72 hours. CBG: No results for input(s): GLUCAP in the last 168 hours. Lipid Profile: No results for input(s): CHOL, HDL, LDLCALC, TRIG, CHOLHDL, LDLDIRECT in the last 72 hours. Thyroid Function Tests: No results for input(s): TSH, T4TOTAL, FREET4, T3FREE, THYROIDAB in the last 72 hours. Anemia Panel: No results for input(s): VITAMINB12, FOLATE, FERRITIN, TIBC, IRON, RETICCTPCT in the last 72 hours. Urine analysis:    Component Value Date/Time   COLORURINE YELLOW 06/26/2023 1635   APPEARANCEUR CLEAR 06/26/2023 1635   LABSPEC 1.015 06/26/2023 1635   PHURINE 6.0 06/26/2023 1635   GLUCOSEU NEGATIVE 06/26/2023 1635   HGBUR NEGATIVE 06/26/2023 1635   BILIRUBINUR NEGATIVE 06/26/2023 1635   KETONESUR NEGATIVE 06/26/2023 1635   PROTEINUR NEGATIVE 06/26/2023 1635    UROBILINOGEN 0.2 03/27/2014 0158   NITRITE NEGATIVE 06/26/2023 1635   LEUKOCYTESUR NEGATIVE 06/26/2023 1635    Radiological Exams on Admission: DG Chest Port 1 View Result Date: 06/26/2023 CLINICAL DATA:  Shortness of breath. Diminished lung sounds. Hypoxia EXAM: PORTABLE CHEST 1 VIEW COMPARISON:  10/31/2022 FINDINGS: Normal sized heart. Mildly calcified thoracic aorta. The lungs remain hyperexpanded and clear. No acute bony abnormality. IMPRESSION: 1. No acute abnormality. 2. COPD. Electronically Signed   By: Elspeth Bathe M.D.   On: 06/26/2023 17:09    EKG: Independently reviewed. Sinus rhythm.  New ST depressions and T wave inversions in inferior leads and slight ST elevations in aVR and aVL.  Assessment and Plan  Acute exacerbation of advanced COPD Chronic hypoxemic respiratory failure on 3 L home oxygen Patient presenting with complaints of worsening shortness of breath and wheezing since yesterday.  He was given IV Solu-Medrol  125 mg, IV mag 1 g, and DuoNeb x 2 by EMS.  He was given an additional DuoNeb treatment in the ED and dyspnea has now improved.  No increasing oxygen requirement from baseline.  COVID/influenza/RSV PCR negative.  Continue treatment with prednisone  60 mg daily, DuoNeb every 6 hours, Pulmicort  neb twice daily, and albuterol  neb every 2 hours as needed.  Continue supplemental oxygen.  Do not think antibiotics are indicated at this time given no cough or sputum production.  No fever or leukocytosis.  Chest x-ray without signs of infection.  Lactic acidosis Lactate peaked at 9.0 and eventually trended down to 4.7.  EDP had consulted critical care physician who felt that this was likely related to the patient's acute presentation and that his lactate is chronically elevated.  Beta agonist use could also be possibly contributing.  Sepsis less likely given no signs of infection.  Left-sided chest pain Patient is endorsing left-sided lateral chest/rib pain which is easily  reproducible on exam suspicious for possible musculoskeletal etiology.  No acute bony abnormality on chest x-ray.  However, EKG did show new ST depressions and T wave inversions in inferior leads and slight ST elevations in aVR and aVL. ?ACS but troponin x 1 negative, repeat ordered and currently  pending.  PE less likely given no tachycardia or increasing oxygen requirement from baseline.  Concern for alcohol  withdrawal Substance abuse Patient is presenting from SNF but his ethanol level was elevated at 121.  Per nursing staff report, there is concern that family or someone else might be bringing him alcoholic beverages and other substances at his nursing facility.  UDS positive THC.  Also positive for opiates but morphine  seems to be on his home medication list (pharmacy verification pending).  At present, patient appears tremulous and jittery which could possibly be related to beta agonist treatments but also concerning for alcohol  withdrawal given history.  Placed on CIWA protocol; Ativan  as needed.  Thiamine , folate, and multivitamin.  TOC consulted for substance abuse counseling.  Chronic normocytic anemia Hemoglobin currently 10.6 and was in the 12-13 range in April/May 2024 but previously in the 9-10 range in March 2024.  Patient is not endorsing any symptoms of GI or any other bleeding.  No obvious signs of bleeding.  Continue to monitor H&H.  Chronic mild hyponatremia Sodium level stable, continue to monitor labs.  Hypertension Not on antihypertensives at home and currently normotensive.  Anxiety and depression Pharmacy med rec pending.  DVT prophylaxis: Lovenox  Code Status: DNR/DNI (discussed with the patient) Family Communication: No family available at this time. Level of care: Progressive Care Unit Admission status: It is my clinical opinion that referral for OBSERVATION is reasonable and necessary in this patient based on the above information provided. The aforementioned taken  together are felt to place the patient at high risk for further clinical deterioration. However, it is anticipated that the patient may be medically stable for discharge from the hospital within 24 to 48 hours.  Editha Ram MD [facility]  If 7PM-7AM, please contact night-coverage [email or web address]  06/27/2023, 4:04 AM

## 2023-06-27 NOTE — Progress Notes (Addendum)
 PHARMACY - ANTICOAGULATION CONSULT NOTE  Pharmacy Consult for Heparin  Indication: chest pain/ACS, r/o PE  Allergies  Allergen Reactions   Other Other (See Comments)    BP med from [facility] about 3 years ago, caused some seizures, due to getting wrong BP med at that time.  Unknown name of med.  Allergy not listed on [facility]     Patient Measurements: Height: 5' 10 (177.8 cm) Weight: 41.7 kg (92 lb) IBW/kg (Calculated) : 73 Heparin  Dosing Weight: TBW   Vital Signs: Temp: 97.6 F (36.4 C) (01/08 1158) Temp Source: Oral (01/08 1158) BP: 123/104 (01/08 1400) Pulse Rate: 91 (01/08 1400)  Labs: Recent Labs    06/26/23 1610 06/27/23 0751 06/27/23 1314  HGB 10.6* 10.6*  --   HCT 31.9* 30.8*  --   PLT 275 236  --   CREATININE 0.55* 0.67  --   TROPONINIHS 10 318* 662*    Estimated Creatinine Clearance: 56.5 mL/min (by C-G formula based on SCr of 0.67 mg/dL).   Medical History: Past Medical History:  Diagnosis Date   Alcohol  abuse    Asthma    Chronic back pain    Chronic knee pain    COPD (chronic obstructive pulmonary disease) (HCC)    Hypertension     Medications:  Infusions:   Assessment: 63 yo M presented with shortness of breath, hx COPD recently transitioned to hospice care.  Patient now with EKG changes and elevated troponin.  Pharmacy consulted for IV heparin . He is not on anticoagulation PTA.  He received Lovenox  40mg  (1mg /kg) at 0914. Baseline labs: Hg low/stable at 10.6, pltc WNL, DDimer elevated.  Goal of Therapy:  Heparin  level 0.3-0.7 units/ml Monitor platelets by anticoagulation protocol: Yes   Plan:  No bolus since had Lovenox  dose this morning Start IV heparin  600 units/hr at 5pm (~8h after Lovenox  dose) Check heparin  level 6h after IV heparin  starts Daily heparin  level & CBC while on heparin  F/U Chest CT results  Rosaline Millet PharmD 06/27/2023,2:33 PM

## 2023-06-27 NOTE — Progress Notes (Addendum)
 [id number]  [facility]       This patient is a current hospice patient with [facility], admitted 5.1.24 with a terminal diagnosis of COPD.     [facility] will continue to follow for any discharge planning needs and to coordinate continuation of hospice care.    Please don't hesitate to call with any Hospice related questions or concerns.    Eleanor Nail, LPN [facility] Liaison [phone]

## 2023-06-27 NOTE — Progress Notes (Addendum)
 [facility] PROGRESS NOTE   Jaime Cline FMW:[id number] DOB: [birth year] DOA: 06/26/2023  PCP: Patient, [name]  Brief History: 63 y.o. male with medical history significant of advanced COPD transitioned to hospice care back in April 2024, hypertension, alcohol /polysubstance abuse, anxiety, depression, chronic back pain presenting to the ED via EMS from his nursing facility with shortness of breath.  Patient was thought to be experiencing COPD exacerbation.  There was also concern for alcohol  withdrawal.  His lactic acid level was noted to be elevated.  He was hospitalized for further management.    Consultants: Phone discussion with critical care medicine  Procedures: None    Subjective/Interval History: Patient mentioned that he is feeling slightly better this morning compared to last night but still short of breath.  Denies any chest pain.  No nausea or vomiting.    Assessment/Plan:  History of advanced COPD with acute exacerbation/chronic respiratory failure with hypoxia Chest x-ray did not show any acute findings.  Patient was noted to be wheezing with the significant diminished air entry bilaterally.  Seems to be experiencing COPD exacerbation.  COVID-19 PCR influenza PCR and RSV were negative. Continue with nebulizer treatments.  Continue with budesonide  nebulized.  Will give Solu-Medrol  for the next 24 hours. Initiate doxycycline .  Lactic acidosis Based on previous chart review it looks like patient has chronically elevated lactic acid levels.  Alcohol  may be contributing as well.  No need to test any further.  Continue to monitor clinically.  Left-sided chest pain Most likely combination of COPD as well as possible musculoskeletal etiology.  Troponin was normal.  Denies any chest pain currently.  EKG did show changes in the inferior leads with T wave inversion.  Repeat troponin level is pending.  Will also check a D-dimer.  Repeat EKG.  ADDENDUM Repeat Trop noted  to be 318. Patient without chest pain currently. Repeat EKG shows abnormal findings as well. Await D dimer. Will order ECHO. Will trend troponins. Start aspirin .  Repeat troponin noted to be 662. D dimer is abnormally high. Start IV heparin  for now. Will proceed with CTA chest to r/o PE. ECHO shows normal LVEF without WMA. Recheck Trop level in AM. May need to d/w cardiology depending on results of CTA, trop levels and AM EKG.  Alcohol  abuse/polysubstance abuse His alcohol  level was noted to be elevated.  Urine drug screen positive for THC.  Patient mentions that he goes to his friend's place to drink.  There is concern for alcohol  withdrawal.  He is currently on CIWA protocol.  Blood pressure is noted to be elevated.  Continue to monitor closely.  Normocytic anemia Stable.  No evidence of overt bleeding.  Hyponatremia Monitor.  Elevated blood pressure readings Not noted to be on any antihypertensives prior to admission.  Elevated blood pressure could be due to pain issues or respiratory distress as well as alcohol  withdrawal.  Continue to monitor for now.  Goals of care He is under hospice care for advanced COPD.  Noted to be on Roxanol as needed prior to admission which can be continued. He is DNR/DNI.  DVT Prophylaxis: Lovenox  Code Status: DNR/DNI Family Communication: Discussed with patient.  No family at bedside Disposition Plan: Back to nursing facility when improved  Status is: Observation The patient will require care spanning > 2 midnights and should be moved to inpatient because: COPD exacerbation    Medications: Scheduled:  budesonide  (PULMICORT ) nebulizer solution  0.25 mg Nebulization BID   enoxaparin  (LOVENOX ) injection  40 mg Subcutaneous Q24H   folic acid   1 mg Oral Daily   ipratropium-albuterol   3 mL Nebulization Q6H   multivitamin with minerals  1 tablet Oral Daily   predniSONE   60 mg Oral Q breakfast   thiamine   100 mg Oral Daily   Or   thiamine   100 mg  Intravenous Daily   Continuous: PRN:acetaminophen  **OR** acetaminophen , albuterol , LORazepam  **OR** LORazepam   Antibiotics: Anti-infectives (From admission, onward)    None       Objective:  Vital Signs  Vitals:   06/27/23 0730 06/27/23 0745 06/27/23 0756 06/27/23 0804  BP: (!) 158/102 (!) 158/102 (!) 158/102   Pulse: 99 (!) 105 (!) 105   Resp: (!) 29 (!) 25    Temp:    98.6 F (37 C)  TempSrc:      SpO2: (!) 88% 95%    Weight:      Height:        Intake/Output Summary (Last 24 hours) at 06/27/2023 0835 Last data filed at 06/27/2023 0145 Gross per 24 hour  Intake 1000 ml  Output --  Net 1000 ml   Filed Weights   06/26/23 1602  Weight: 41.7 kg    General appearance: Awake alert.  In no distress Resp: Tachypneic.  No use of accessory muscles.  Diminished air entry bilaterally with wheezing bilaterally.  No crackles. Cardio: S1-S2 is tachycardic regular.  No S3-S4.  No rubs murmurs or bruit GI: Abdomen is soft.  Nontender nondistended.  Bowel sounds are present normal.  No masses organomegaly Extremities: No edema.  Moves all of his extremities Neurologic: Somewhat distracted.  No obvious focal neurological deficits noted.   Lab Results:  Data Reviewed: I have personally reviewed following labs and reports of the imaging studies  CBC: Recent Labs  Lab 06/26/23 1610 06/27/23 0751  WBC 4.6 9.8  NEUTROABS 2.0  --   HGB 10.6* 10.6*  HCT 31.9* 30.8*  MCV 96.4 93.6  PLT 275 236    Basic Metabolic Panel: Recent Labs  Lab 06/26/23 1610  NA 134*  K 4.5  CL 91*  CO2 31  GLUCOSE 79  BUN 13  CREATININE 0.55*  CALCIUM  8.7*    GFR: Estimated Creatinine Clearance: 56.5 mL/min (A) (by C-G formula based on SCr of 0.55 mg/dL (L)).  Liver Function Tests: Recent Labs  Lab 06/26/23 1610  AST 36  ALT 16  ALKPHOS 56  BILITOT 0.6  PROT 6.8  ALBUMIN 4.0      Recent Results (from the past 240 hours)  Resp panel by RT-PCR (RSV, Flu A&B, Covid) Anterior  Nasal Swab     Status: None   Collection Time: 06/26/23  4:18 PM   Specimen: Anterior Nasal Swab  Result Value Ref Range Status   SARS Coronavirus 2 by RT PCR NEGATIVE NEGATIVE Final    Comment: (NOTE) SARS-CoV-2 target nucleic acids are NOT DETECTED.  The SARS-CoV-2 RNA is generally detectable in upper respiratory specimens during the acute phase of infection. The lowest concentration of SARS-CoV-2 viral copies this assay can detect is 138 copies/mL. A negative result does not preclude SARS-Cov-2 infection and should not be used as the sole basis for treatment or other patient management decisions. A negative result may occur with  improper specimen collection/handling, submission of specimen other than nasopharyngeal swab, presence of viral mutation(s) within the areas targeted by this assay, and inadequate number of viral copies(<138 copies/mL). A negative result must be combined with clinical observations, patient history, and  epidemiological information. The expected result is Negative.  Fact Sheet for Patients:  bloggercourse.com  Fact Sheet for Healthcare Providers:  seriousbroker.it  This test is no t yet approved or cleared by the [place]  FDA and  has been authorized for detection and/or diagnosis of SARS-CoV-2 by FDA under an Emergency Use Authorization (EUA). This EUA will remain  in effect (meaning this test can be used) for the duration of the COVID-19 declaration under Section 564(b)(1) of the Act, 21 U.S.C.section 360bbb-3(b)(1), unless the authorization is terminated  or revoked sooner.       Influenza A by PCR NEGATIVE NEGATIVE Final   Influenza B by PCR NEGATIVE NEGATIVE Final    Comment: (NOTE) The Xpert Xpress SARS-CoV-2/FLU/RSV plus assay is intended as an aid in the diagnosis of influenza from Nasopharyngeal swab specimens and should not be used as a sole basis for treatment. Nasal washings  and aspirates are unacceptable for Xpert Xpress SARS-CoV-2/FLU/RSV testing.  Fact Sheet for Patients: bloggercourse.com  Fact Sheet for Healthcare Providers: seriousbroker.it  This test is not yet approved or cleared by the [place]  FDA and has been authorized for detection and/or diagnosis of SARS-CoV-2 by FDA under an Emergency Use Authorization (EUA). This EUA will remain in effect (meaning this test can be used) for the duration of the COVID-19 declaration under Section 564(b)(1) of the Act, 21 U.S.C. section 360bbb-3(b)(1), unless the authorization is terminated or revoked.     Resp Syncytial Virus by PCR NEGATIVE NEGATIVE Final    Comment: (NOTE) Fact Sheet for Patients: bloggercourse.com  Fact Sheet for Healthcare Providers: seriousbroker.it  This test is not yet approved or cleared by the [place]  FDA and has been authorized for detection and/or diagnosis of SARS-CoV-2 by FDA under an Emergency Use Authorization (EUA). This EUA will remain in effect (meaning this test can be used) for the duration of the COVID-19 declaration under Section 564(b)(1) of the Act, 21 U.S.C. section 360bbb-3(b)(1), unless the authorization is terminated or revoked.  Performed at [facility], [address], [place], [place] [address]       Radiology Studies: [facility] Chest Port 1 View Result Date: 06/26/2023 CLINICAL DATA:  Shortness of breath. Diminished lung sounds. Hypoxia EXAM: PORTABLE CHEST 1 VIEW COMPARISON:  10/31/2022 FINDINGS: Normal sized heart. Mildly calcified thoracic aorta. The lungs remain hyperexpanded and clear. No acute bony abnormality. IMPRESSION: 1. No acute abnormality. 2. COPD. Electronically Signed   By: Elspeth Bathe M.D.   On: 06/26/2023 17:09       LOS: 0 days   Nasiya Pascual Verdene  [facility] Pager on [email or web address]  06/27/2023,  8:35 AM

## 2023-06-27 NOTE — Progress Notes (Signed)
  Echocardiogram 2D Echocardiogram has been performed.  Ocie Doyne RDCS 06/27/2023, 2:57 PM

## 2023-06-27 NOTE — Progress Notes (Signed)
 [facility] Hospitalized Hospice Patient   Jaime Cline is a current hospice patient followed at [facility] with a terminal diagnosis of COPD.  [facility] was notified by facility that patient was being transferred to the ED. Patient presented with shortness of breath and concern for COPD exacerbation. Per Dr. Ephriam Showman, hospice MD, this is a related hospital admission.    Patient easily awakened, very pleasant and in good spirits. He is waiting for his room to be prepared then will transfer out of the ED. Per floor nurse, Troponin level was high, and they will be drawing another one soon. Patient states he is not uncomfortable, not currently having SOB and is not in pain.    Patient is GIP appropriate for close clinical monitoring of SOB, COPD exacerbation and IV medications.    Vital Signs  97.6/90/21   133/87    O2 100% on 2 lpm   I&O  not documented   Abnormal labs: Sodium 134, Chloride 92, Glucose 104, Troponin 318, RBC 3.29, Hemoglobin 10.6, HCT 30.8, Glucose 104   Diagnostics:  Narrative & Impression  CLINICAL DATA:  Shortness of breath. Diminished lung sounds. Hypoxia   EXAM: PORTABLE CHEST 1 VIEW   COMPARISON:  10/31/2022   FINDINGS: Normal sized heart. Mildly calcified thoracic aorta. The lungs remain hyperexpanded and clear. No acute bony abnormality.   IMPRESSION: 1. No acute abnormality. 2. COPD.    Electronically Signed   By: Elspeth Bathe M.D.   On: 06/26/2023 17:09        IV and PRN Meds: IV Ativan  2mg  x1, PO Ativan  1 mg x1, IV Sodium Chloride  0.9% 541mL/hr   Assessment & Plan per Alfornia Madison, MD 1.8.25:    Assessment and Plan   Acute exacerbation of advanced COPD Chronic hypoxemic respiratory failure on 3 L home oxygen Patient presenting with complaints of worsening shortness of breath and wheezing since yesterday.  He was given IV Solu-Medrol  125 mg, IV mag 1 g, and DuoNeb x 2 by EMS.  He was given an additional DuoNeb  treatment in the ED and dyspnea has now improved.  No increasing oxygen requirement from baseline.  COVID/influenza/RSV PCR negative.  Continue treatment with prednisone  60 mg daily, DuoNeb every 6 hours, Pulmicort  neb twice daily, and albuterol  neb every 2 hours as needed.  Continue supplemental oxygen.  Do not think antibiotics are indicated at this time given no cough or sputum production.  No fever or leukocytosis.  Chest x-ray without signs of infection.   Lactic acidosis Lactate peaked at 9.0 and eventually trended down to 4.7.  EDP had consulted critical care physician who felt that this was likely related to the patient's acute presentation and that his lactate is chronically elevated.  Beta agonist use could also be possibly contributing.  Sepsis less likely given no signs of infection.   Left-sided chest pain Patient is endorsing left-sided lateral chest/rib pain which is easily reproducible on exam suspicious for possible musculoskeletal etiology.  No acute bony abnormality on chest x-ray.  However, EKG did show new ST depressions and T wave inversions in inferior leads and slight ST elevations in aVR and aVL. ?ACS but troponin x 1 negative, repeat ordered and currently pending.  PE less likely given no tachycardia or increasing oxygen requirement from baseline.   Concern for alcohol  withdrawal Substance abuse Patient is presenting from SNF but his ethanol level was elevated at 121.  Per nursing staff report, there is concern that family or someone else might be  bringing him alcoholic beverages and other substances at his nursing facility.  UDS positive THC.  Also positive for opiates but morphine  seems to be on his home medication list (pharmacy verification pending).  At present, patient appears tremulous and jittery which could possibly be related to beta agonist treatments but also concerning for alcohol  withdrawal given history.  Placed on CIWA protocol; Ativan  as needed.  Thiamine , folate,  and multivitamin.  TOC consulted for substance abuse counseling.   Chronic normocytic anemia Hemoglobin currently 10.6 and was in the 12-13 range in April/May 2024 but previously in the 9-10 range in March 2024.  Patient is not endorsing any symptoms of GI or any other bleeding.  No obvious signs of bleeding.  Continue to monitor H&H.   Chronic mild hyponatremia Sodium level stable, continue to monitor labs.   Hypertension Not on antihypertensives at home and currently normotensive.   Anxiety and depression Pharmacy med rec pending.     Discharge Plan:  back to LTC facility once medically stable  Family Contact: Attempted family contact via phone.   Goals of Care: DNR   IDT:  updated   If patient requires EMS transport at discharge, please us  [facility] as that is who [facility] is contracted with for transport.     Please call with any hospice related questions or concerns.    Eleanor Nail, LPN [facility] [phone]

## 2023-06-27 NOTE — Plan of Care (Signed)

## 2023-06-27 NOTE — ED Provider Notes (Signed)
 Patient pending critical care evaluation.  Evaluated by critical care.  See course below.  He is hemodynamically stable.  On hospice.  Will reengage hospitalist for admission.  Clinical Course as of 06/27/23 0421  Wed Jun 27, 2023  0126 Spoke with critical care, Dr. Layman.  States that lactates are likely related to his acute presentation.  No other ongoing concerns.  He has a chronically elevated lactate normally around 5.  Will repeat 1 additional time and ensure that this was drawn appropriately and placed on ice. [CH]  0420 Repeat lactate persistently elevated.  Patient remains clinically stable.  Will discuss with hospitalist. [CH]    Clinical Course User Index [CH] Brentlee Sciara, Charmaine FALCON, MD      Bari Charmaine FALCON, MD 06/27/23 [phone]

## 2023-06-28 DIAGNOSIS — Z1152 Encounter for screening for COVID-19: Secondary | ICD-10-CM | POA: Diagnosis not present

## 2023-06-28 DIAGNOSIS — F32A Depression, unspecified: Secondary | ICD-10-CM | POA: Diagnosis present

## 2023-06-28 DIAGNOSIS — F10930 Alcohol use, unspecified with withdrawal, uncomplicated: Secondary | ICD-10-CM | POA: Diagnosis not present

## 2023-06-28 DIAGNOSIS — E872 Acidosis, unspecified: Secondary | ICD-10-CM | POA: Diagnosis present

## 2023-06-28 DIAGNOSIS — Z87891 Personal history of nicotine dependence: Secondary | ICD-10-CM | POA: Diagnosis not present

## 2023-06-28 DIAGNOSIS — I21A1 Myocardial infarction type 2: Secondary | ICD-10-CM | POA: Diagnosis present

## 2023-06-28 DIAGNOSIS — R7989 Other specified abnormal findings of blood chemistry: Secondary | ICD-10-CM | POA: Diagnosis not present

## 2023-06-28 DIAGNOSIS — D649 Anemia, unspecified: Secondary | ICD-10-CM

## 2023-06-28 DIAGNOSIS — Z7951 Long term (current) use of inhaled steroids: Secondary | ICD-10-CM | POA: Diagnosis not present

## 2023-06-28 DIAGNOSIS — F419 Anxiety disorder, unspecified: Secondary | ICD-10-CM | POA: Diagnosis present

## 2023-06-28 DIAGNOSIS — Z79899 Other long term (current) drug therapy: Secondary | ICD-10-CM | POA: Diagnosis not present

## 2023-06-28 DIAGNOSIS — Z66 Do not resuscitate: Secondary | ICD-10-CM | POA: Diagnosis present

## 2023-06-28 DIAGNOSIS — Y906 Blood alcohol level of 120-199 mg/100 ml: Secondary | ICD-10-CM | POA: Diagnosis present

## 2023-06-28 DIAGNOSIS — I10 Essential (primary) hypertension: Secondary | ICD-10-CM | POA: Diagnosis present

## 2023-06-28 DIAGNOSIS — E871 Hypo-osmolality and hyponatremia: Secondary | ICD-10-CM | POA: Diagnosis present

## 2023-06-28 DIAGNOSIS — Z681 Body mass index (BMI) 19 or less, adult: Secondary | ICD-10-CM | POA: Diagnosis not present

## 2023-06-28 DIAGNOSIS — J441 Chronic obstructive pulmonary disease with (acute) exacerbation: Secondary | ICD-10-CM | POA: Diagnosis present

## 2023-06-28 DIAGNOSIS — E43 Unspecified severe protein-calorie malnutrition: Secondary | ICD-10-CM | POA: Diagnosis present

## 2023-06-28 DIAGNOSIS — J9611 Chronic respiratory failure with hypoxia: Secondary | ICD-10-CM | POA: Diagnosis present

## 2023-06-28 DIAGNOSIS — F10129 Alcohol abuse with intoxication, unspecified: Secondary | ICD-10-CM | POA: Diagnosis present

## 2023-06-28 DIAGNOSIS — Z9981 Dependence on supplemental oxygen: Secondary | ICD-10-CM | POA: Diagnosis not present

## 2023-06-28 DIAGNOSIS — Z515 Encounter for palliative care: Secondary | ICD-10-CM | POA: Diagnosis not present

## 2023-06-28 LAB — CBC
HCT: 27.6 % — ABNORMAL LOW (ref 39.0–52.0)
Hemoglobin: 9.7 g/dL — ABNORMAL LOW (ref 13.0–17.0)
MCH: 32.1 pg (ref 26.0–34.0)
MCHC: 35.1 g/dL (ref 30.0–36.0)
MCV: 91.4 fL (ref 80.0–100.0)
Platelets: 205 10*3/uL (ref 150–400)
RBC: 3.02 MIL/uL — ABNORMAL LOW (ref 4.22–5.81)
RDW: 15.2 % (ref 11.5–15.5)
WBC: 3.9 10*3/uL — ABNORMAL LOW (ref 4.0–10.5)
nRBC: 0 % (ref 0.0–0.2)

## 2023-06-28 LAB — LIPID PANEL
Cholesterol: 134 mg/dL (ref 0–200)
HDL: 87 mg/dL (ref >40)
LDL Cholesterol: 37 mg/dL (ref 0–99)
Total CHOL/HDL Ratio: 1.5 {ratio}
Triglycerides: 52 mg/dL (ref <150)
VLDL: 10 mg/dL (ref 0–40)

## 2023-06-28 LAB — HEPARIN LEVEL (UNFRACTIONATED)
Heparin Unfractionated: 0.26 [IU]/mL — ABNORMAL LOW (ref 0.30–0.70)
Heparin Unfractionated: 0.28 [IU]/mL — ABNORMAL LOW (ref 0.30–0.70)
Heparin Unfractionated: <0.1 [IU]/mL — ABNORMAL LOW (ref 0.30–0.70)

## 2023-06-28 LAB — MAGNESIUM: Magnesium: 1.9 mg/dL (ref 1.7–2.4)

## 2023-06-28 LAB — TROPONIN I (HIGH SENSITIVITY): Troponin I (High Sensitivity): 147 ng/L (ref <18)

## 2023-06-28 MED ORDER — METHOCARBAMOL 500 MG PO TABS
500.0000 mg | ORAL_TABLET | Freq: Three times a day (TID) | ORAL | Status: DC | PRN
  Administered 2023-06-28 – 2023-07-01 (×4): 500 mg via ORAL
  Filled 2023-06-28 (×4): qty 1

## 2023-06-28 MED ORDER — HEPARIN BOLUS VIA INFUSION
1200.0000 [IU] | Freq: Once | INTRAVENOUS | Status: AC
  Administered 2023-06-28: 1200 [IU] via INTRAVENOUS
  Filled 2023-06-28: qty 1200

## 2023-06-28 MED ORDER — ENSURE ENLIVE PO LIQD
237.0000 mL | Freq: Three times a day (TID) | ORAL | Status: DC
  Administered 2023-06-28 – 2023-07-01 (×9): 237 mL via ORAL

## 2023-06-28 NOTE — Progress Notes (Signed)
 PHARMACY - ANTICOAGULATION CONSULT NOTE  Pharmacy Consult for Heparin  Indication: chest pain/ACS, r/o PE  Allergies  Allergen Reactions   Other Other (See Comments)    BP med from [facility] about 3 years ago, caused some seizures, due to getting wrong BP med at that time.  Unknown name of med.  Allergy not listed on [facility]     Patient Measurements: Height: 5' 10 (177.8 cm) Weight: 40.4 kg (89 lb 1.1 oz) IBW/kg (Calculated) : 73 Heparin  Dosing Weight: TBW   Vital Signs: Temp: 98.5 F (36.9 C) (01/08 2300) Temp Source: Oral (01/08 2300) BP: 130/91 (01/08 2300) Pulse Rate: 97 (01/08 2300)  Labs: Recent Labs    06/26/23 1610 06/27/23 0751 06/27/23 1314 06/27/23 2350  HGB 10.6* 10.6*  --   --   HCT 31.9* 30.8*  --   --   PLT 275 236  --   --   HEPARINUNFRC  --   --   --  <0.10*  CREATININE 0.55* 0.67  --   --   TROPONINIHS 10 318* 662*  --     Estimated Creatinine Clearance: 54.7 mL/min (by C-G formula based on SCr of 0.67 mg/dL).   Medical History: Past Medical History:  Diagnosis Date   Alcohol  abuse    Asthma    Chronic back pain    Chronic knee pain    COPD (chronic obstructive pulmonary disease) (HCC)    Hypertension     Medications:  Infusions:   heparin  600 Units/hr (06/27/23 1725)    Assessment: 63 yo M presented with shortness of breath, hx COPD recently transitioned to hospice care.  Patient now with EKG changes and elevated troponin.  Pharmacy consulted for IV heparin . He is not on anticoagulation PTA.  He received Lovenox  40mg  (1mg /kg) at 0914. Baseline labs: Hg low/stable at 10.6, pltc WNL, DDimer elevated.  HL < 0.1 sub-therapeutic on 600 units/hr No bleeding noted  Goal of Therapy:  Heparin  level 0.3-0.7 units/ml Monitor platelets by anticoagulation protocol: Yes   Plan:  Heparin  bolus 1200 units x 1 Increase heparin  drip to 750 units/hr Heparin  level in 6 hours Daily heparin  level & CBC while on heparin  F/U Chest CT results  Leeroy Mace RPh 06/28/2023, 12:46 AM

## 2023-06-28 NOTE — TOC Initial Note (Signed)
 Transition of Care [facility]) - Initial/Assessment Note   Patient Details  Name: Jaime Cline MRN: [id number] Date of Birth: 1961-01-06  Transition of Care [facility]) CM/SW Contact:    Duwaine GORMAN Aran, LCSW Phone Number: 06/28/2023, 11:24 AM  Clinical Narrative: [facility] consulted for ETOH use resources. CSW met with patient and confirmed patient is a LTC resident at [facility] with [facility] providing hospice services. Patient is agreeable to ETOH use resources being added to AVS even though he will return to his LTC facility at discharge. FL2 done. Patient will need to be transported via [facility] as [facility] has a community education officer with [facility]. CSW confirmed patient will return with Crystal in admissions at [facility].  Expected Discharge Plan: Long Term Nursing Home Barriers to Discharge: Continued Medical Work up  Patient Goals and CMS Choice Patient states their goals for this hospitalization and ongoing recovery are:: Return to [facility] with [facility] hospice services Choice offered to / list presented to : NA  Expected Discharge Plan and Services In-house Referral: Clinical Social Work Living arrangements for the past 2 months: Skilled Nursing Facility           DME Arranged: N/A DME Agency: NA  Prior Living Arrangements/Services Living arrangements for the past 2 months: Skilled Nursing Facility Lives with:: Facility Resident Patient language and need for interpreter reviewed:: Yes Do you feel safe going back to the place where you live?: Yes      Need for Family Participation in Patient Care: No (Comment) Care giver support system in place?: Yes (comment) Criminal Activity/Legal Involvement Pertinent to Current Situation/Hospitalization: No - Comment as needed  Activities of Daily Living ADL Screening (condition at time of admission) Independently performs ADLs?: No Does the patient have a NEW difficulty with bathing/dressing/toileting/self-feeding that is expected to last >3 days?:  No Does the patient have a NEW difficulty with getting in/out of bed, walking, or climbing stairs that is expected to last >3 days?: No Does the patient have a NEW difficulty with communication that is expected to last >3 days?: No Is the patient deaf or have difficulty hearing?: Yes (left ear) Does the patient have difficulty seeing, even when wearing glasses/contacts?: No Does the patient have difficulty concentrating, remembering, or making decisions?: No  Permission Sought/Granted Permission sought to share information with : Magazine Features Editor, Other (comment) Permission granted to share information with : Yes, Verbal Permission Granted Permission granted to share info w AGENCY: [facility]  Emotional Assessment Appearance:: Appears stated age Attitude/Demeanor/Rapport: Engaged Affect (typically observed): Accepting Orientation: : Oriented to Self, Oriented to Place, Oriented to Situation Alcohol  / Substance Use: Alcohol  Use Psych Involvement: No (comment)  Admission diagnosis:  COPD with acute exacerbation (HCC) [J44.1] Dyspnea, unspecified type [R06.00] Patient Active Problem List   Diagnosis Date Noted   Lactic acidosis 06/27/2023   Chest pain 06/27/2023   Alcohol  withdrawal (HCC) 06/27/2023   Substance abuse (HCC) 06/27/2023   Normocytic anemia 06/27/2023   AKI (acute kidney injury) (HCC) 08/18/2022   Metabolic acidosis, increased anion gap 08/18/2022   Depression 08/18/2022   Dyspepsia 08/18/2022   Sepsis due to pneumonia (HCC) 08/17/2022   Dyspnea 04/12/2022   Palliative care encounter 04/11/2022   Goals of care, counseling/discussion 04/11/2022   Counseling and coordination of care 04/11/2022   COPD with acute exacerbation (HCC) 04/10/2022   Syncope 09/28/2021   Protein-calorie malnutrition, severe 09/27/2021   Hyponatremia    Hypotension due to hypovolemia    Syncope and collapse 09/26/2021   CAP (community  acquired pneumonia)    COPD  exacerbation (HCC) 08/27/2021   Asthma, chronic 12/31/2012   Smoker 12/31/2012   Knee pain, bilateral 12/31/2012   Iron deficiency anemia 12/31/2012   Knee pain, chronic 12/31/2012   PCP:  Patient, [name] Pharmacy:  No Pharmacies Listed  Social Drivers of Health (SDOH) Social History: SDOH Screenings   Food Insecurity: No Food Insecurity (06/27/2023)  Housing: Low Risk  (06/27/2023)  Transportation Needs: No Transportation Needs (06/27/2023)  Utilities: Not At Risk (06/27/2023)  Social Connections: Unknown (06/27/2023)  Tobacco Use: High Risk (06/27/2023)   SDOH Interventions:    Readmission Risk Interventions    04/12/2022    4:02 PM 01/27/2022   12:27 PM  Readmission Risk Prevention Plan  Transportation Screening Complete Complete  PCP or Specialist Appt within 5-7 Days Complete Not Complete  Not Complete comments  new PCP made for 8/28  Home Care Screening Complete Complete  Medication Review (RN CM) Complete Complete

## 2023-06-28 NOTE — Progress Notes (Signed)
 PHARMACY - ANTICOAGULATION CONSULT NOTE  Pharmacy Consult for heparin  Indication: ACS  Allergies  Allergen Reactions   Other Other (See Comments)    BP med from [facility] about 3 years ago, caused some seizures, due to getting wrong BP med at that time.  Unknown name of med.  Allergy not listed on [facility]     Patient Measurements: Height: 5' 10 (177.8 cm) Weight: 40.4 kg (89 lb 1.1 oz) IBW/kg (Calculated) : 73 Heparin  Dosing Weight: n/a. Use total body weight of 40 kg  Vital Signs: Temp: 98.2 F (36.8 C) (01/09 0303) Temp Source: Oral (01/09 0303) BP: 121/78 (01/09 0303) Pulse Rate: 82 (01/09 0303)  Labs: Recent Labs    06/26/23 1610 06/27/23 0751 06/27/23 1314 06/27/23 2350 06/28/23 0342 06/28/23 0611  HGB 10.6* 10.6*  --   --  9.7*  --   HCT 31.9* 30.8*  --   --  27.6*  --   PLT 275 236  --   --  205  --   HEPARINUNFRC  --   --   --  <0.10*  --   --   CREATININE 0.55* 0.67  --   --   --   --   TROPONINIHS 10 318* 662*  --   --  147*    Estimated Creatinine Clearance: 54.7 mL/min (by C-G formula based on SCr of 0.67 mg/dL).   Medical History: Past Medical History:  Diagnosis Date   Alcohol  abuse    Asthma    Chronic back pain    Chronic knee pain    COPD (chronic obstructive pulmonary disease) (HCC)    Hypertension     Medications: No anticoagulants PTA  Assessment: Pt is a 58 yoM with end stage COPD, followed by hospice. Pt presented to ED with SOB. CTA negative for PE. Troponins were elevated, pharmacy consulted to dose heparin  for ACS.  Today, 06/28/23 Heparin  level = 0.26 is slightly subtherapeutic on heparin  infusion of 750 units/hr CBC: Hgb low and decreased, Plt WNL Confirmed with RN - no bleeding or interruptions Troponins trending down  Goal of Therapy:  Heparin  level 0.3-0.7 units/ml Monitor platelets by anticoagulation protocol: Yes   Plan:  Increase heparin  infusion to 850 units/hr Check heparin  level 6-8 hours after rate increase CBC,  heparin  level daily Monitor for signs of bleeding  Ronal CHRISTELLA Rav, PharmD 06/28/2023,7:53 AM

## 2023-06-28 NOTE — Progress Notes (Signed)
 PHARMACY - ANTICOAGULATION CONSULT NOTE  Pharmacy Consult for heparin  Indication: ACS  Allergies  Allergen Reactions   Other Other (See Comments)    BP med from [facility] about 3 years ago, caused some seizures, due to getting wrong BP med at that time.  Unknown name of med.  Allergy not listed on [facility]     Patient Measurements: Height: 5' 10 (177.8 cm) Weight: 40.4 kg (89 lb 1.1 oz) IBW/kg (Calculated) : 73 Heparin  Dosing Weight: n/a. Use total body weight of 40 kg  Vital Signs: Temp: 98.8 F (37.1 C) (01/09 1323) Temp Source: Oral (01/09 1323) BP: 118/72 (01/09 1700) Pulse Rate: 90 (01/09 1700)  Labs: Recent Labs    06/26/23 1610 06/27/23 0751 06/27/23 1314 06/27/23 2350 06/28/23 0342 06/28/23 0611 06/28/23 0744 06/28/23 1716  HGB 10.6* 10.6*  --   --  9.7*  --   --   --   HCT 31.9* 30.8*  --   --  27.6*  --   --   --   PLT 275 236  --   --  205  --   --   --   HEPARINUNFRC  --   --   --  <0.10*  --   --  0.26* 0.28*  CREATININE 0.55* 0.67  --   --   --   --   --   --   TROPONINIHS 10 318* 662*  --   --  147*  --   --     Estimated Creatinine Clearance: 54.7 mL/min (by C-G formula based on SCr of 0.67 mg/dL).   Medical History: Past Medical History:  Diagnosis Date   Alcohol  abuse    Asthma    Chronic back pain    Chronic knee pain    COPD (chronic obstructive pulmonary disease) (HCC)    Hypertension     Medications: No anticoagulants PTA  Assessment: Pt is a 64 yoM with end stage COPD, followed by hospice. Pt presented to ED with SOB. CTA negative for PE. Troponins were elevated, pharmacy consulted to dose heparin  for ACS.  Today, 06/28/23 Heparin  level 0.28 - remains subtherapeutic on heparin  infusion after increase to 850 units/hr CBC: Hgb low and decreased, Plt WNL No complications of therapy noted Troponins trending down  Goal of Therapy:  Heparin  level 0.3-0.7 units/ml Monitor platelets by anticoagulation protocol: Yes   Plan:  Increase  heparin  infusion to 950 units/hr Check heparin  level 6-8 hours after rate increase CBC, heparin  level daily Monitor for signs of bleeding  Stefano MARLA Bologna, PharmD, BCPS Clinical Pharmacist 06/28/2023 6:31 PM

## 2023-06-28 NOTE — Plan of Care (Addendum)
 Patient alert and oriented, except some loss of time. VSS, SR-ST on monitor. On 3L [place], baseline. Improved work of breathing per patient. Some LUE nerve pain and fine movement loss with hand contractures. Patient 1X assist with FWW, can be unsteady when ambulating. POC is return to [facility].   Problem: Education: Goal: Knowledge of General Education information will improve Description: Including pain rating scale, medication(s)/side effects and non-pharmacologic comfort measures 06/28/2023 0930 by Rosanne Elspeth HERO, RN Outcome: Progressing 06/28/2023 0930 by Rosanne Elspeth HERO, RN Outcome: Progressing   Problem: Health Behavior/Discharge Planning: Goal: Ability to manage health-related needs will improve 06/28/2023 0930 by Rosanne Elspeth HERO, RN Outcome: Progressing 06/28/2023 0930 by Rosanne Elspeth HERO, RN Outcome: Progressing   Problem: Clinical Measurements: Goal: Ability to maintain clinical measurements within normal limits will improve 06/28/2023 0930 by Rosanne Elspeth HERO, RN Outcome: Progressing 06/28/2023 0930 by Rosanne Elspeth HERO, RN Outcome: Progressing Goal: Will remain free from infection 06/28/2023 0930 by Rosanne Elspeth HERO, RN Outcome: Progressing 06/28/2023 0930 by Rosanne Elspeth HERO, RN Outcome: Progressing Goal: Diagnostic test results will improve 06/28/2023 0930 by Rosanne Elspeth HERO, RN Outcome: Progressing 06/28/2023 0930 by Rosanne Elspeth HERO, RN Outcome: Progressing Goal: Respiratory complications will improve 06/28/2023 0930 by Rosanne Elspeth HERO, RN Outcome: Progressing 06/28/2023 0930 by Rosanne Elspeth HERO, RN Outcome: Progressing Goal: Cardiovascular complication will be avoided 06/28/2023 0930 by Rosanne Elspeth HERO, RN Outcome: Progressing 06/28/2023 0930 by Rosanne Elspeth HERO, RN Outcome: Progressing   Problem: Activity: Goal: Risk for activity intolerance will decrease 06/28/2023 0930 by Rosanne Elspeth HERO, RN Outcome:  Progressing 06/28/2023 0930 by Rosanne Elspeth HERO, RN Outcome: Progressing   Problem: Nutrition: Goal: Adequate nutrition will be maintained 06/28/2023 0930 by Rosanne Elspeth HERO, RN Outcome: Progressing 06/28/2023 0930 by Rosanne Elspeth HERO, RN Outcome: Progressing   Problem: Coping: Goal: Level of anxiety will decrease 06/28/2023 0930 by Rosanne Elspeth HERO, RN Outcome: Progressing 06/28/2023 0930 by Rosanne Elspeth HERO, RN Outcome: Progressing   Problem: Elimination: Goal: Will not experience complications related to bowel motility 06/28/2023 0930 by Rosanne Elspeth HERO, RN Outcome: Progressing 06/28/2023 0930 by Rosanne Elspeth HERO, RN Outcome: Progressing Goal: Will not experience complications related to urinary retention 06/28/2023 0930 by Rosanne Elspeth HERO, RN Outcome: Progressing 06/28/2023 0930 by Rosanne Elspeth HERO, RN Outcome: Progressing   Problem: Pain Management: Goal: General experience of comfort will improve 06/28/2023 0930 by Rosanne Elspeth HERO, RN Outcome: Progressing 06/28/2023 0930 by Rosanne Elspeth HERO, RN Outcome: Progressing   Problem: Safety: Goal: Ability to remain free from injury will improve 06/28/2023 0930 by Rosanne Elspeth HERO, RN Outcome: Progressing 06/28/2023 0930 by Rosanne Elspeth HERO, RN Outcome: Progressing   Problem: Skin Integrity: Goal: Risk for impaired skin integrity will decrease 06/28/2023 0930 by Rosanne Elspeth HERO, RN Outcome: Progressing 06/28/2023 0930 by Rosanne Elspeth HERO, RN Outcome: Progressing   Problem: Education: Goal: Knowledge of disease or condition will improve 06/28/2023 0930 by Rosanne Elspeth HERO, RN Outcome: Progressing 06/28/2023 0930 by Rosanne Elspeth HERO, RN Outcome: Progressing Goal: Knowledge of the prescribed therapeutic regimen will improve 06/28/2023 0930 by Rosanne Elspeth HERO, RN Outcome: Progressing 06/28/2023 0930 by Rosanne Elspeth HERO, RN Outcome: Progressing Goal: Individualized  Educational Video(s) 06/28/2023 0930 by Rosanne Elspeth HERO, RN Outcome: Progressing 06/28/2023 0930 by Rosanne Elspeth HERO, RN Outcome: Progressing   Problem: Activity: Goal: Ability to tolerate increased activity will improve 06/28/2023 0930 by Rosanne Elspeth HERO, RN Outcome: Progressing 06/28/2023 0930 by Rosanne Elspeth HERO, RN Outcome: Progressing Goal: Will verbalize  the importance of balancing activity with adequate rest periods 06/28/2023 0930 by Rosanne Elspeth HERO, RN Outcome: Progressing 06/28/2023 0930 by Rosanne Elspeth HERO, RN Outcome: Progressing   Problem: Respiratory: Goal: Ability to maintain a clear airway will improve Outcome: Progressing Goal: Levels of oxygenation will improve Outcome: Progressing Goal: Ability to maintain adequate ventilation will improve Outcome: Progressing

## 2023-06-28 NOTE — Progress Notes (Signed)
 [facility] Hospitalized Hospice Patient   Jaime Cline is a current hospice patient followed at [facility] with a terminal diagnosis of COPD.  [facility] was notified by facility that patient was being transferred to the ED. Patient presented with shortness of breath and concern for COPD exacerbation. Per Dr. Ephriam Showman, hospice MD, this is a related hospital admission.    Patient easily awakened again this morning, very pleasant and in good spirits. He reports eating his breakfast, not currently having SOB and not in any discomfort. Patient was observed to have some difficulty taking a deep breath. I was able to speak with patient's niece by phone today as well. He will plan to return to [facility] at discharge.   Patient is GIP appropriate for close clinical monitoring of COPD exacerbation and IV medications.   Vital Signs  98.8/101/18   118/75    O2 100% on 3 lpm   I&O  1043/200   Abnormal labs: Troponin 147, Heparin  Studies 0.26  Diagnostics: none new  IV and PRN Meds: PO Ativan  1 mg x1, IV Solu-Medrol  80 mg x2, IV Heparin  8.5 mL/hr continuous    Assessment & Plan per Verdene Purchase, MD 1.9.24:  Assessment/Plan:   History of advanced COPD with acute exacerbation/chronic respiratory failure with hypoxia Chest x-ray did not show any acute findings.  Patient was noted to be wheezing with the significant diminished air entry bilaterally.   Thought to have COPD exacerbation. COVID-19 PCR influenza PCR and RSV were negative. Started on Solu-Medrol , nebulizer treatments, budesonide , doxycycline . Respiratory status seems to be improving. Mentions that he uses oxygen at home only as needed and not on a continuous basis. Leave him on Solu-Medrol  for another 24 hours and then transition to steroids from tomorrow.   Transient left-sided chest pain/elevated troponin/possible NSTEMI Patient also had nonspecific EKG findings with the T wave inversions initially in the  inferior leads and then in the lateral leads.  Patient's chest pain has resolved.  Her troponin levels did go up slightly but are trending down again.  EKG from this morning shows T inversions in V4 V5 V6. Echocardiogram shows normal LVEF with no wall motion abnormalities.   CT angiogram of the chest was done due to elevated D-dimer and does not show any pulmonary embolism. Do not think that this patient who is under hospice care for advanced/end-stage COPD is currently a candidate for inpatient ischemic workup since symptoms were transient and somewhat nonspecific along with the fact that he has severe lung disease.   Will leave him on IV heparin  for total of 48 hours. Will leave him on aspirin .  No beta-blocker due to his severe COPD. LDL noted to be 37. May benefit from outpatient consultation with cardiology.   Lactic acidosis Based on previous chart review it looks like patient has chronically elevated lactic acid levels.  Alcohol  may be contributing as well.  No need to test any further.  Continue to monitor clinically.   Alcohol  abuse/polysubstance abuse His alcohol  level was noted to be elevated.  Urine drug screen positive for THC.  Patient mentions that he goes to his friend's place to drink.  There is concern for alcohol  withdrawal.   Started on CIWA protocol.  Symptoms have improved.  No withdrawal symptoms noted this morning.   Left hand cramp No swelling noted.  No indication for imaging studies.  Muscle relaxants as needed.   Normocytic anemia Stable.  No evidence of overt bleeding.   Hyponatremia Monitor.   Elevated blood  pressure readings Not noted to be on any antihypertensives prior to admission.  Elevated blood pressure could be due to pain issues or respiratory distress as well as alcohol  withdrawal.  Seems to have improved.  Continue to monitor.   Discharge Plan:  back to LTC facility once medically stable   Family Contact: Spoke with family by phone   Goals of  Care: DNR   IDT:  updated   If patient requires EMS transport at discharge, please us  [facility] as that is who [facility] is contracted with for transport.   Please call with any hospice related questions or concerns.    Eleanor Nail, LPN [facility] [phone]

## 2023-06-28 NOTE — Progress Notes (Signed)
 Initial Nutrition Assessment  DOCUMENTATION CODES:   Severe malnutrition in context of chronic illness  INTERVENTION:  - Liberalize diet to Regular to avoid restricting patient intake given severe malnutrition.  - Ensure Plus High Protein po TID, each supplement provides 350 kcal and 20 grams of protein. - Continue Multivitamin with minerals, folic acid , and thiamine  for history of alcohol  abuse.  - Monitor weight trends.   NUTRITION DIAGNOSIS:   Severe Malnutrition related to chronic illness (advanced COPD) as evidenced by severe fat depletion, severe muscle depletion.  GOAL:   Patient will meet greater than or equal to 90% of their needs  MONITOR:   PO intake, Supplement acceptance, Weight trends  REASON FOR ASSESSMENT:   Malnutrition Screening Tool    ASSESSMENT:   63 y.o. male with PMH significant of advanced COPD transitioned to hospice care back in April 2024, HTN, alcohol /polysubstance abuse, anxiety, depression, chronic back pain who presented from his nursing facility with shortness of breath. Admitted for COPD exacerbation.  Patient reports a UBW of 80# but states his weight fluctuates a lot between 80-100#.  Per EMR, weight has fluctuated between 89-99# over the past year.   He endorses eating on and off PTA. On a good day would eat 3 meals a day but on a bad day may only eat 1 meal. Admits appetite has been poor recently. Drinking Ensure 2-3 times a day at home.   Current appetite is a little better and patient is documented to be consuming 100% of meals and drinking Ensure. Will increase Ensure to TID to further support intake and add a MVI to support micronutrient needs.   Medications reviewed and include: 1mg  folic acid , MVI, 100mg  thiamine   Labs reviewed:  -   NUTRITION - FOCUSED PHYSICAL EXAM:  Flowsheet Row Most Recent Value  Orbital Region Severe depletion  Upper Arm Region Severe depletion  Thoracic and Lumbar Region Severe depletion  Buccal  Region Severe depletion  Temple Region Severe depletion  Clavicle Bone Region Severe depletion  Clavicle and Acromion Bone Region Severe depletion  Scapular Bone Region Unable to assess  Dorsal Hand Severe depletion  Patellar Region Severe depletion  Anterior Thigh Region Severe depletion  Posterior Calf Region Severe depletion  Edema (RD Assessment) None  Hair Reviewed  Eyes Reviewed  Mouth Reviewed  Skin Reviewed  Nails Reviewed       Diet Order:   Diet Order             Diet Heart Room service appropriate? Yes; Fluid consistency: Thin  Diet effective now                   EDUCATION NEEDS:  Education needs have been addressed  Skin:  Skin Assessment: Reviewed RN Assessment  Last BM:  1/8  Height:  Ht Readings from Last 1 Encounters:  06/27/23 5' 10 (1.778 m)   Weight:  Wt Readings from Last 1 Encounters:  06/27/23 40.4 kg    BMI:  Body mass index is 12.78 kg/m.  Estimated Nutritional Needs:  Kcal:  1600-1800 kcals Protein:  75-90 grams Fluid:  >/= 1.8L    Jaime Cline RD, LDN Contact via Secure Chat.

## 2023-06-28 NOTE — Progress Notes (Signed)
 [facility] PROGRESS NOTE   ALVER LEETE FMW:[id number] DOB: Jul 28, 1960 DOA: 06/26/2023  PCP: Patient, [name]  Brief History: 63 y.o. male with medical history significant of advanced COPD transitioned to hospice care back in April 2024, hypertension, alcohol /polysubstance abuse, anxiety, depression, chronic back pain presenting to the ED via EMS from his nursing facility with shortness of breath.  Patient was thought to be experiencing COPD exacerbation.  There was also concern for alcohol  withdrawal.  His lactic acid level was noted to be elevated.  He was hospitalized for further management.    Consultants: Phone discussion with critical care medicine  Procedures: Transthoracic echocardiogram    Subjective/Interval History: Patient denies any chest pain shortness of breath this morning.  Mentions that he feels better.  The only thing he is concerned about is the cramps in his left hand.  No nausea vomiting.   Assessment/Plan:  History of advanced COPD with acute exacerbation/chronic respiratory failure with hypoxia Chest x-ray did not show any acute findings.  Patient was noted to be wheezing with the significant diminished air entry bilaterally.   Thought to have COPD exacerbation. COVID-19 PCR influenza PCR and RSV were negative. Started on Solu-Medrol , nebulizer treatments, budesonide , doxycycline . Respiratory status seems to be improving. Mentions that he uses oxygen at home only as needed and not on a continuous basis. Leave him on Solu-Medrol  for another 24 hours and then transition to steroids from tomorrow.  Transient left-sided chest pain/elevated troponin/possible NSTEMI Patient also had nonspecific EKG findings with the T wave inversions initially in the inferior leads and then in the lateral leads.  Patient's chest pain has resolved.  Her troponin levels did go up slightly but are trending down again.  EKG from this morning shows T inversions in V4 V5  V6. Echocardiogram shows normal LVEF with no wall motion abnormalities.   CT angiogram of the chest was done due to elevated D-dimer and does not show any pulmonary embolism. Do not think that this patient who is under hospice care for advanced/end-stage COPD is currently a candidate for inpatient ischemic workup since symptoms were transient and somewhat nonspecific along with the fact that he has severe lung disease.   Will leave him on IV heparin  for total of 48 hours. Will leave him on aspirin .  No beta-blocker due to his severe COPD. LDL noted to be 37. May benefit from outpatient consultation with cardiology.  Lactic acidosis Based on previous chart review it looks like patient has chronically elevated lactic acid levels.  Alcohol  may be contributing as well.  No need to test any further.  Continue to monitor clinically.  Alcohol  abuse/polysubstance abuse His alcohol  level was noted to be elevated.  Urine drug screen positive for THC.  Patient mentions that he goes to his friend's place to drink.  There is concern for alcohol  withdrawal.   Started on CIWA protocol.  Symptoms have improved.  No withdrawal symptoms noted this morning.  Left hand cramp No swelling noted.  No indication for imaging studies.  Muscle relaxants as needed.  Normocytic anemia Stable.  No evidence of overt bleeding.  Hyponatremia Monitor.  Elevated blood pressure readings Not noted to be on any antihypertensives prior to admission.  Elevated blood pressure could be due to pain issues or respiratory distress as well as alcohol  withdrawal.  Seems to have improved.  Continue to monitor.  Goals of care He is under hospice care for advanced COPD.  Noted to be on Roxanol as needed  prior to admission which can be continued. He is DNR/DNI.  DVT Prophylaxis: Lovenox  Code Status: DNR/DNI Family Communication: Discussed with patient.  No family at bedside Disposition Plan: Back to nursing facility when  improved  Status is: Observation The patient will require care spanning > 2 midnights and should be moved to inpatient because: COPD exacerbation    Medications: Scheduled:  aspirin   324 mg Oral Daily   budesonide  (PULMICORT ) nebulizer solution  0.25 mg Nebulization BID   doxycycline   100 mg Oral Q12H   feeding supplement  237 mL Oral BID BM   folic acid   1 mg Oral Daily   gabapentin   400 mg Oral TID   ipratropium-albuterol   3 mL Nebulization Q6H   methylPREDNISolone  (SOLU-MEDROL ) injection  80 mg Intravenous Q12H   multivitamin with minerals  1 tablet Oral Daily   nicotine   14 mg Transdermal Daily   sertraline   50 mg Oral Daily   thiamine   100 mg Oral Daily   Or   thiamine   100 mg Intravenous Daily   Continuous:  heparin  750 Units/hr (06/28/23 0100)   PRN:acetaminophen  **OR** acetaminophen , albuterol , hydrALAZINE , LORazepam  **OR** LORazepam , methocarbamol , morphine  CONCENTRATE, traZODone   Antibiotics: Anti-infectives (From admission, onward)    Start     Dose/Rate Route Frequency Ordered Stop   06/27/23 1000  doxycycline  (VIBRA -TABS) tablet 100 mg        100 mg Oral Every 12 hours 06/27/23 0845         Objective:  Vital Signs  Vitals:   06/27/23 2100 06/27/23 2300 06/28/23 0158 06/28/23 0303  BP:  (!) 130/91  121/78  Pulse:  97  82  Resp: 18 18  18   Temp:  98.5 F (36.9 C)  98.2 F (36.8 C)  TempSrc:  Oral  Oral  SpO2:   98% 100%  Weight:      Height:        Intake/Output Summary (Last 24 hours) at 06/28/2023 1007 Last data filed at 06/28/2023 0900 Gross per 24 hour  Intake 1403 ml  Output 300 ml  Net 1103 ml   Filed Weights   06/26/23 1602 06/27/23 1507  Weight: 41.7 kg 40.4 kg    General appearance: Awake alert.  In no distress Resp: Normal effort at rest noted today.  Improved air entry bilaterally.  Less wheezing.  No crackles. Cardio: S1-S2 is normal regular.  No S3-S4.  No rubs murmurs or bruit GI: Abdomen is soft.  Nontender nondistended.   Bowel sounds are present normal.  No masses organomegaly No swelling noted over the left hand. No obvious focal neurological deficits.   Lab Results:  Data Reviewed: I have personally reviewed following labs and reports of the imaging studies  CBC: Recent Labs  Lab 06/26/23 1610 06/27/23 0751 06/28/23 0342  WBC 4.6 9.8 3.9*  NEUTROABS 2.0  --   --   HGB 10.6* 10.6* 9.7*  HCT 31.9* 30.8* 27.6*  MCV 96.4 93.6 91.4  PLT 275 236 205    Basic Metabolic Panel: Recent Labs  Lab 06/26/23 1610 06/27/23 0751 06/27/23 1314 06/28/23 0342  NA 134* 134*  --   --   K 4.5 4.2  --   --   CL 91* 92*  --   --   CO2 31 30  --   --   GLUCOSE 79 104*  --   --   BUN 13 14  --   --   CREATININE 0.55* 0.67  --   --  CALCIUM  8.7* 9.1  --   --   MG  --   --  2.0 1.9    GFR: Estimated Creatinine Clearance: 54.7 mL/min (by C-G formula based on SCr of 0.67 mg/dL).  Liver Function Tests: Recent Labs  Lab 06/26/23 1610  AST 36  ALT 16  ALKPHOS 56  BILITOT 0.6  PROT 6.8  ALBUMIN 4.0      Recent Results (from the past 240 hours)  Resp panel by RT-PCR (RSV, Flu A&B, Covid) Anterior Nasal Swab     Status: None   Collection Time: 06/26/23  4:18 PM   Specimen: Anterior Nasal Swab  Result Value Ref Range Status   SARS Coronavirus 2 by RT PCR NEGATIVE NEGATIVE Final    Comment: (NOTE) SARS-CoV-2 target nucleic acids are NOT DETECTED.  The SARS-CoV-2 RNA is generally detectable in upper respiratory specimens during the acute phase of infection. The lowest concentration of SARS-CoV-2 viral copies this assay can detect is 138 copies/mL. A negative result does not preclude SARS-Cov-2 infection and should not be used as the sole basis for treatment or other patient management decisions. A negative result may occur with  improper specimen collection/handling, submission of specimen other than nasopharyngeal swab, presence of viral mutation(s) within the areas targeted by this assay, and  inadequate number of viral copies(<138 copies/mL). A negative result must be combined with clinical observations, patient history, and epidemiological information. The expected result is Negative.  Fact Sheet for Patients:  bloggercourse.com  Fact Sheet for Healthcare Providers:  seriousbroker.it  This test is no t yet approved or cleared by the [place]  FDA and  has been authorized for detection and/or diagnosis of SARS-CoV-2 by FDA under an Emergency Use Authorization (EUA). This EUA will remain  in effect (meaning this test can be used) for the duration of the COVID-19 declaration under Section 564(b)(1) of the Act, 21 U.S.C.section 360bbb-3(b)(1), unless the authorization is terminated  or revoked sooner.       Influenza A by PCR NEGATIVE NEGATIVE Final   Influenza B by PCR NEGATIVE NEGATIVE Final    Comment: (NOTE) The Xpert Xpress SARS-CoV-2/FLU/RSV plus assay is intended as an aid in the diagnosis of influenza from Nasopharyngeal swab specimens and should not be used as a sole basis for treatment. Nasal washings and aspirates are unacceptable for Xpert Xpress SARS-CoV-2/FLU/RSV testing.  Fact Sheet for Patients: bloggercourse.com  Fact Sheet for Healthcare Providers: seriousbroker.it  This test is not yet approved or cleared by the [place]  FDA and has been authorized for detection and/or diagnosis of SARS-CoV-2 by FDA under an Emergency Use Authorization (EUA). This EUA will remain in effect (meaning this test can be used) for the duration of the COVID-19 declaration under Section 564(b)(1) of the Act, 21 U.S.C. section 360bbb-3(b)(1), unless the authorization is terminated or revoked.     Resp Syncytial Virus by PCR NEGATIVE NEGATIVE Final    Comment: (NOTE) Fact Sheet for Patients: bloggercourse.com  Fact Sheet for Healthcare  Providers: seriousbroker.it  This test is not yet approved or cleared by the [place]  FDA and has been authorized for detection and/or diagnosis of SARS-CoV-2 by FDA under an Emergency Use Authorization (EUA). This EUA will remain in effect (meaning this test can be used) for the duration of the COVID-19 declaration under Section 564(b)(1) of the Act, 21 U.S.C. section 360bbb-3(b)(1), unless the authorization is terminated or revoked.  Performed at [facility], [address], [place], [place] [address]   Culture,  blood (routine x 2)     Status: None (Preliminary result)   Collection Time: 06/27/23 12:20 AM   Specimen: BLOOD  Result Value Ref Range Status   Specimen Description   Final    BLOOD LEFT UNDERARM Performed at [facility], [address], [place], [place] [address]    Special Requests   Final    BOTTLES DRAWN AEROBIC AND ANAEROBIC Blood Culture adequate volume Performed at [facility], [address], [place], [place] [address]    Culture   Final    NO GROWTH 1 DAY Performed at [facility], [address] [place], [place] [address]    Report Status PENDING  Incomplete  Culture, blood (routine x 2)     Status: None (Preliminary result)   Collection Time: 06/27/23 12:45 AM   Specimen: BLOOD  Result Value Ref Range Status   Specimen Description   Final    BLOOD BLOOD RIGHT FOREARM Performed at [facility], [address], [place], [place] [address]    Special Requests   Final    BOTTLES DRAWN AEROBIC AND ANAEROBIC Blood Culture adequate volume Performed at [facility], [address], [place], [place] [address]    Culture   Final    NO GROWTH 1 DAY Performed at [facility], [address] [place], [place] [address]    Report Status PENDING  Incomplete      Radiology Studies: CT Angio Chest Pulmonary Embolism (PE)  W or WO Contrast Result Date: 06/27/2023 CLINICAL DATA:  Chest pain dyspnea EXAM: CT ANGIOGRAPHY CHEST WITH CONTRAST TECHNIQUE: Multidetector CT imaging of the chest was performed using the standard protocol during bolus administration of intravenous contrast. Multiplanar CT image reconstructions and MIPs were obtained to evaluate the vascular anatomy. RADIATION DOSE REDUCTION: This exam was performed according to the departmental dose-optimization program which includes automated exposure control, adjustment of the mA and/or kV according to patient size and/or use of iterative reconstruction technique. CONTRAST:  75mL OMNIPAQUE  IOHEXOL  350 MG/ML SOLN COMPARISON:  Chest x-ray 06/26/2023, CT chest 08/27/2021 FINDINGS: Cardiovascular: Satisfactory opacification of the pulmonary arteries to the segmental level. No evidence of pulmonary embolism. Normal heart size. No pericardial effusion. Nonaneurysmal aorta. Mild atherosclerosis. No dissection. Coronary artery calcification Mediastinum/Nodes: Trachea. No thyroid mass. No suspicious lymph nodes. Esophagus within normal limits Lungs/Pleura: Emphysema. No acute airspace disease, pleural effusion or pneumothorax. Upper Abdomen: No acute finding Musculoskeletal: No acute osseous abnormality Review of the MIP images confirms the above findings. IMPRESSION: 1. Negative for acute pulmonary embolus or aortic dissection. 2. Emphysema. 3. Aortic atherosclerosis. Aortic Atherosclerosis (ICD10-I70.0) and Emphysema (ICD10-J43.9). Electronically Signed   By: Luke Bun M.D.   On: 06/27/2023 19:48   ECHOCARDIOGRAM COMPLETE Result Date: 06/27/2023    ECHOCARDIOGRAM REPORT   Patient Name:   Jaime Cline Date of Exam: 06/27/2023 Medical Rec #:  [id number]    Height:       70.0 in Accession #:    [id number]   Weight:       92.0 lb Date of Birth:  06-18-1961    BSA:          1.501 m Patient Age:    62 years     BP:           123/67 mmHg Patient Gender: M            HR:  90 bpm.  Exam Location:  Inpatient Procedure: 2D Echo, Cardiac Doppler and Color Doppler Indications:    CP  History:        Patient has prior history of Echocardiogram examinations, most                 recent 08/28/2021. COPD, Signs/Symptoms:Dyspnea, Hypotension and                 Syncope; Risk Factors:Hypertension.  Sonographer:    Juanita Shaw Referring Phys: [id number] Rolph IMPRESSIONS  1. Left ventricular ejection fraction, by estimation, is 60 to 65%. The left ventricle has normal function. The left ventricle has no regional wall motion abnormalities. Left ventricular diastolic parameters are consistent with Grade I diastolic dysfunction (impaired relaxation).  2. Right ventricular systolic function is normal. The right ventricular size is normal. Tricuspid regurgitation signal is inadequate for assessing PA pressure.  3. The mitral valve is grossly normal. No evidence of mitral valve regurgitation.  4. The aortic valve was not well visualized. Aortic valve regurgitation is not visualized.  5. The inferior vena cava is normal in size with greater than 50% respiratory variability, suggesting right atrial pressure of 3 mmHg. FINDINGS  Left Ventricle: Left ventricular ejection fraction, by estimation, is 60 to 65%. The left ventricle has normal function. The left ventricle has no regional wall motion abnormalities. The left ventricular internal cavity size was normal in size. There is  no left ventricular hypertrophy. Left ventricular diastolic parameters are consistent with Grade I diastolic dysfunction (impaired relaxation). Right Ventricle: The right ventricular size is normal. Right ventricular systolic function is normal. Tricuspid regurgitation signal is inadequate for assessing PA pressure. Left Atrium: Left atrial size was normal in size. Right Atrium: Right atrial size was normal in size. Pericardium: There is no evidence of pericardial effusion. Mitral Valve: The mitral valve is grossly normal. No evidence  of mitral valve regurgitation. MV peak gradient, 2.0 mmHg. The mean mitral valve gradient is 1.0 mmHg. Tricuspid Valve: Tricuspid valve regurgitation is not demonstrated. Aortic Valve: The aortic valve was not well visualized. Aortic valve regurgitation is not visualized. Aortic valve mean gradient measures 2.0 mmHg. Aortic valve peak gradient measures 3.5 mmHg. Aortic valve area, by VTI measures 1.80 cm. Pulmonic Valve: Pulmonic valve regurgitation is not visualized. Aorta: The aortic root and ascending aorta are structurally normal, with no evidence of dilitation. Venous: The inferior vena cava is normal in size with greater than 50% respiratory variability, suggesting right atrial pressure of 3 mmHg. IAS/Shunts: The interatrial septum was not well visualized.  LEFT VENTRICLE PLAX 2D LVIDd:         4.10 cm      Diastology LVIDs:         2.90 cm      LV e' medial:    9.03 cm/s LV PW:         0.60 cm      LV E/e' medial:  4.1 LV IVS:        0.50 cm      LV e' lateral:   9.90 cm/s LVOT diam:     1.70 cm      LV E/e' lateral: 3.7 LV SV:         31 LV SV Index:   21 LVOT Area:     2.27 cm  LV Volumes (MOD) LV vol d, MOD A2C: 96.6 ml LV vol d, MOD A4C: 117.0 ml LV vol s, MOD A2C: 37.5 ml LV vol  s, MOD A4C: 44.4 ml LV SV MOD A2C:     59.1 ml LV SV MOD A4C:     117.0 ml LV SV MOD BP:      72.9 ml RIGHT VENTRICLE             IVC RV Basal diam:  3.10 cm     IVC diam: 1.70 cm RV Mid diam:    2.00 cm RV S prime:     15.30 cm/s TAPSE (M-mode): 2.2 cm LEFT ATRIUM             Index        RIGHT ATRIUM          Index LA Vol (A2C):   24.8 ml 16.53 ml/m  RA Area:     9.29 cm LA Vol (A4C):   37.0 ml 24.66 ml/m  RA Volume:   18.80 ml 12.53 ml/m LA Biplane Vol: 32.8 ml 21.86 ml/m  AORTIC VALVE                    PULMONIC VALVE AV Area (Vmax):    1.35 cm     PV Vmax:       0.88 m/s AV Area (Vmean):   1.38 cm     PV Peak grad:  3.1 mmHg AV Area (VTI):     1.80 cm AV Vmax:           93.00 cm/s AV Vmean:          59.100 cm/s AV  VTI:            0.174 m AV Peak Grad:      3.5 mmHg AV Mean Grad:      2.0 mmHg LVOT Vmax:         55.40 cm/s LVOT Vmean:        35.900 cm/s LVOT VTI:          0.138 m LVOT/AV VTI ratio: 0.79  AORTA Ao Root diam: 2.50 cm MITRAL VALVE MV Area (PHT): 5.42 cm    SHUNTS MV Area VTI:   2.96 cm    Systemic VTI:  0.14 m MV Peak grad:  2.0 mmHg    Systemic Diam: 1.70 cm MV Mean grad:  1.0 mmHg MV Vmax:       0.72 m/s MV Vmean:      46.7 cm/s MV Decel Time: 140 msec MV E velocity: 36.70 cm/s MV A velocity: 52.80 cm/s MV E/A ratio:  0.70 [place] signed by Ronal Ross Signature Date/Time: 06/27/2023/5:14:40 PM    Final    DG Chest Port 1 View Result Date: 06/26/2023 CLINICAL DATA:  Shortness of breath. Diminished lung sounds. Hypoxia EXAM: PORTABLE CHEST 1 VIEW COMPARISON:  10/31/2022 FINDINGS: Normal sized heart. Mildly calcified thoracic aorta. The lungs remain hyperexpanded and clear. No acute bony abnormality. IMPRESSION: 1. No acute abnormality. 2. COPD. Electronically Signed   By: Elspeth Bathe M.D.   On: 06/26/2023 17:09       LOS: 0 days   Teriyah Purington Verdene  [facility] Pager on [email or web address]  06/28/2023, 10:07 AM

## 2023-06-28 NOTE — [place] FL2 (Signed)
   MEDICAID FL2 LEVEL OF CARE FORM     IDENTIFICATION  Patient Name: Jaime Cline Birthdate: [birth year] Sex: male Admission Date (Current Location): 06/26/2023  [place] and [place] Number:  [place] [id number] Facility and Address:  [facility],  [address], [place] [address]      Provider Number: [id number]  Attending Physician Name and Address:  Verdene Purchase, MD  Relative Name and Phone Number:  Lenn Balloon (niece) Ph: [phone]    Current Level of Care: Hospital Recommended Level of Care: Skilled Nursing Facility Prior Approval Number:    Date Approved/Denied:   PASRR Number: [id number]  Discharge Plan: SNF [facility])    Current Diagnoses: Patient Active Problem List   Diagnosis Date Noted   Lactic acidosis 06/27/2023   Chest pain 06/27/2023   Alcohol  withdrawal (HCC) 06/27/2023   Substance abuse (HCC) 06/27/2023   Normocytic anemia 06/27/2023   AKI (acute kidney injury) (HCC) 08/18/2022   Metabolic acidosis, increased anion gap 08/18/2022   Depression 08/18/2022   Dyspepsia 08/18/2022   Sepsis due to pneumonia (HCC) 08/17/2022   Dyspnea 04/12/2022   Palliative care encounter 04/11/2022   Goals of care, counseling/discussion 04/11/2022   Counseling and coordination of care 04/11/2022   COPD with acute exacerbation (HCC) 04/10/2022   Syncope 09/28/2021   Protein-calorie malnutrition, severe 09/27/2021   Hyponatremia    Hypotension due to hypovolemia    Syncope and collapse 09/26/2021   CAP (community acquired pneumonia)    COPD exacerbation (HCC) 08/27/2021   Asthma, chronic 12/31/2012   Smoker 12/31/2012   Knee pain, bilateral 12/31/2012   Iron deficiency anemia 12/31/2012   Knee pain, chronic 12/31/2012    Orientation RESPIRATION BLADDER Height & Weight     Self, Situation, Place  O2 (3L/min) Continent Weight: 89 lb 1.1 oz (40.4 kg) Height:  5' 10 (177.8 cm)  BEHAVIORAL SYMPTOMS/MOOD NEUROLOGICAL BOWEL  NUTRITION STATUS      Incontinent Diet (Heart healthy diet)  AMBULATORY STATUS COMMUNICATION OF NEEDS Skin   Limited Assist Verbally Normal                       Personal Care Assistance Level of Assistance  Bathing, Feeding, Dressing Bathing Assistance: Limited assistance Feeding assistance: Independent Dressing Assistance: Limited assistance     Functional Limitations Info  Sight, Hearing, Speech Sight Info: Adequate Hearing Info: Impaired Speech Info: Adequate    SPECIAL CARE FACTORS FREQUENCY                       Contractures Contractures Info: Present (Left hand)    Additional Factors Info  Code Status, Allergies, Psychotropic Code Status Info: DNR Allergies Info: Other Psychotropic Info: See discharge summary         Current Medications (06/28/2023):  This is the current hospital active medication list Current Facility-Administered Medications  Medication Dose Route Frequency Provider Last Rate Last Admin   acetaminophen  (TYLENOL ) tablet 650 mg  650 mg Oral Q6H PRN Rathore, Vasundhra, MD   650 mg at 06/27/23 2109   Or   acetaminophen  (TYLENOL ) suppository 650 mg  650 mg Rectal Q6H PRN Alfornia Madison, MD       albuterol  (PROVENTIL ) (2.5 MG/3ML) 0.083% nebulizer solution 2.5 mg  2.5 mg Nebulization Q2H PRN Rathore, Vasundhra, MD   2.5 mg at 06/27/23 2115   aspirin  chewable tablet 324 mg  324 mg Oral Daily Krishnan, Gokul, MD   324 mg at 06/28/23 [id number]  budesonide  (PULMICORT ) nebulizer solution 0.25 mg  0.25 mg Nebulization BID Rathore, Vasundhra, MD   0.25 mg at 06/28/23 1024   doxycycline  (VIBRA -TABS) tablet 100 mg  100 mg Oral Q12H Krishnan, Gokul, MD   100 mg at 06/28/23 0744   feeding supplement (ENSURE ENLIVE / ENSURE PLUS) liquid 237 mL  237 mL Oral BID BM Krishnan, Gokul, MD   237 mL at 06/28/23 9060   folic acid  (FOLVITE ) tablet 1 mg  1 mg Oral Daily Rathore, Vasundhra, MD   1 mg at 06/28/23 9060   gabapentin  (NEURONTIN ) capsule 400 mg  400 mg  Oral TID Krishnan, Gokul, MD   400 mg at 06/28/23 0940   heparin  ADULT infusion 100 units/mL (25000 units/250mL)  850 Units/hr Intravenous Continuous Seabron Ronal HERO, RPH 8.5 mL/hr at 06/28/23 1022 850 Units/hr at 06/28/23 1022   hydrALAZINE  (APRESOLINE ) injection 5 mg  5 mg Intravenous Q6H PRN Krishnan, Gokul, MD       ipratropium-albuterol  (DUONEB) 0.5-2.5 (3) MG/3ML nebulizer solution 3 mL  3 mL Nebulization Q6H Alfornia Madison, MD   3 mL at 06/28/23 1024   LORazepam  (ATIVAN ) tablet 1-4 mg  1-4 mg Oral Q1H PRN Rathore, Vasundhra, MD   1 mg at 06/27/23 9075   Or   LORazepam  (ATIVAN ) injection 1-4 mg  1-4 mg Intravenous Q1H PRN Rathore, Vasundhra, MD   2 mg at 06/27/23 0645   methocarbamol  (ROBAXIN ) tablet 500 mg  500 mg Oral Q8H PRN Krishnan, Gokul, MD       methylPREDNISolone  sodium succinate  (SOLU-MEDROL ) 125 mg/2 mL injection 80 mg  80 mg Intravenous Q12H Krishnan, Gokul, MD   80 mg at 06/28/23 0500   morphine  CONCENTRATE 10 mg / 0.5 ml oral solution 5 mg  5 mg Oral Q8H PRN Krishnan, Gokul, MD       multivitamin with minerals tablet 1 tablet  1 tablet Oral Daily Rathore, Vasundhra, MD   1 tablet at 06/28/23 0940   nicotine  (NICODERM CQ  - dosed in mg/24 hours) patch 14 mg  14 mg Transdermal Daily Krishnan, Gokul, MD   14 mg at 06/28/23 9057   sertraline  (ZOLOFT ) tablet 50 mg  50 mg Oral Daily Krishnan, Gokul, MD   50 mg at 06/28/23 0940   thiamine  (VITAMIN B1) tablet 100 mg  100 mg Oral Daily Rathore, Vasundhra, MD   100 mg at 06/28/23 0940   Or   thiamine  (VITAMIN B1) injection 100 mg  100 mg Intravenous Daily Alfornia Madison, MD       traZODone  (DESYREL ) tablet 50 mg  50 mg Oral QHS PRN Krishnan, Gokul, MD   50 mg at 06/27/23 2110     Discharge Medications: Please see discharge summary for a list of discharge medications.  Relevant Imaging Results:  Relevant Lab Results:   Additional Information SSN: [id number]  Duwaine GORMAN Aran, LCSW

## 2023-06-29 ENCOUNTER — Other Ambulatory Visit (HOSPITAL_COMMUNITY): Payer: Self-pay

## 2023-06-29 ENCOUNTER — Other Ambulatory Visit: Payer: Self-pay

## 2023-06-29 DIAGNOSIS — J441 Chronic obstructive pulmonary disease with (acute) exacerbation: Secondary | ICD-10-CM | POA: Diagnosis not present

## 2023-06-29 LAB — CBC
HCT: 27.3 % — ABNORMAL LOW (ref 39.0–52.0)
Hemoglobin: 9.2 g/dL — ABNORMAL LOW (ref 13.0–17.0)
MCH: 31.6 pg (ref 26.0–34.0)
MCHC: 33.7 g/dL (ref 30.0–36.0)
MCV: 93.8 fL (ref 80.0–100.0)
Platelets: 190 10*3/uL (ref 150–400)
RBC: 2.91 MIL/uL — ABNORMAL LOW (ref 4.22–5.81)
RDW: 15.5 % (ref 11.5–15.5)
WBC: 4.9 10*3/uL (ref 4.0–10.5)
nRBC: 0 % (ref 0.0–0.2)

## 2023-06-29 LAB — BASIC METABOLIC PANEL
Anion gap: 4 — ABNORMAL LOW (ref 5–15)
BUN: 18 mg/dL (ref 8–23)
CO2: 33 mmol/L — ABNORMAL HIGH (ref 22–32)
Calcium: 8.6 mg/dL — ABNORMAL LOW (ref 8.9–10.3)
Chloride: 96 mmol/L — ABNORMAL LOW (ref 98–111)
Creatinine, Ser: 0.48 mg/dL — ABNORMAL LOW (ref 0.61–1.24)
GFR, Estimated: >60 mL/min (ref >60)
Glucose, Bld: 135 mg/dL — ABNORMAL HIGH (ref 70–99)
Potassium: 4.2 mmol/L (ref 3.5–5.1)
Sodium: 133 mmol/L — ABNORMAL LOW (ref 135–145)

## 2023-06-29 LAB — HEPARIN LEVEL (UNFRACTIONATED)
Heparin Unfractionated: 0.24 [IU]/mL — ABNORMAL LOW (ref 0.30–0.70)
Heparin Unfractionated: 0.46 [IU]/mL (ref 0.30–0.70)

## 2023-06-29 LAB — MAGNESIUM: Magnesium: 1.8 mg/dL (ref 1.7–2.4)

## 2023-06-29 MED ORDER — ALBUTEROL SULFATE HFA 108 (90 BASE) MCG/ACT IN AERS
1.0000 | INHALATION_SPRAY | Freq: Four times a day (QID) | RESPIRATORY_TRACT | 0 refills | Status: AC | PRN
  Filled 2023-06-29: qty 18, 14d supply, fill #0

## 2023-06-29 MED ORDER — NICOTINE 14 MG/24HR TD PT24
14.0000 mg | MEDICATED_PATCH | Freq: Every day | TRANSDERMAL | 0 refills | Status: AC
  Filled 2023-06-29: qty 28, 28d supply, fill #0

## 2023-06-29 MED ORDER — PREDNISONE 50 MG PO TABS
50.0000 mg | ORAL_TABLET | Freq: Every day | ORAL | Status: DC
  Administered 2023-06-30 – 2023-07-01 (×2): 50 mg via ORAL
  Filled 2023-06-29 (×2): qty 1

## 2023-06-29 MED ORDER — PREDNISONE 10 MG PO TABS
ORAL_TABLET | ORAL | 0 refills | Status: AC
  Filled 2023-06-29: qty 30, 12d supply, fill #0

## 2023-06-29 MED ORDER — IPRATROPIUM-ALBUTEROL 0.5-2.5 (3) MG/3ML IN SOLN
3.0000 mL | Freq: Three times a day (TID) | RESPIRATORY_TRACT | Status: DC
  Administered 2023-06-29 – 2023-07-01 (×8): 3 mL via RESPIRATORY_TRACT
  Filled 2023-06-29 (×8): qty 3

## 2023-06-29 MED ORDER — ASPIRIN 81 MG PO TBEC
81.0000 mg | DELAYED_RELEASE_TABLET | Freq: Every day | ORAL | 0 refills | Status: AC
  Filled 2023-06-29: qty 30, 30d supply, fill #0

## 2023-06-29 MED ORDER — GABAPENTIN 300 MG PO CAPS
600.0000 mg | ORAL_CAPSULE | Freq: Three times a day (TID) | ORAL | 0 refills | Status: AC
  Filled 2023-06-29: qty 180, 30d supply, fill #0

## 2023-06-29 MED ORDER — MORPHINE SULFATE (CONCENTRATE) 20 MG/ML PO SOLN
5.0000 mg | Freq: Three times a day (TID) | ORAL | 0 refills | Status: DC | PRN
  Filled 2023-06-29: qty 30, 40d supply, fill #0

## 2023-06-29 MED ORDER — DOXYCYCLINE HYCLATE 100 MG PO TABS
100.0000 mg | ORAL_TABLET | Freq: Two times a day (BID) | ORAL | 0 refills | Status: AC
  Filled 2023-06-29: qty 4, 2d supply, fill #0

## 2023-06-29 MED ORDER — FOLIC ACID 1 MG PO TABS
1.0000 mg | ORAL_TABLET | Freq: Every day | ORAL | 0 refills | Status: AC
  Filled 2023-06-29: qty 30, 30d supply, fill #0

## 2023-06-29 MED ORDER — IPRATROPIUM-ALBUTEROL 0.5-2.5 (3) MG/3ML IN SOLN
3.0000 mL | RESPIRATORY_TRACT | 0 refills | Status: AC
  Filled 2023-06-29: qty 360, 62d supply, fill #0

## 2023-06-29 MED ORDER — GABAPENTIN 300 MG PO CAPS
600.0000 mg | ORAL_CAPSULE | Freq: Three times a day (TID) | ORAL | Status: DC
  Administered 2023-06-29 – 2023-07-01 (×8): 600 mg via ORAL
  Filled 2023-06-29 (×8): qty 2

## 2023-06-29 NOTE — Progress Notes (Addendum)
 [facility] Hospitalized Hospice Patient   Jaime Cline is a current hospice patient followed at [facility] with a terminal diagnosis of COPD.  [facility] was notified by facility that patient was being transferred to the ED. Patient presented with shortness of breath and concern for COPD exacerbation. Per Dr. Ephriam Showman, hospice MD, this is a related hospital admission.    Visited patient who denies complaint at this time. He reports that he "is blessed". Reviewed plan for likely discharge back to facility tomorrow which patient is aware of and in agreement with. Reviewed care and update with patient's Niece by phone.    Patient is GIP appropriate for close clinical monitoring of COPD exacerbation and IV steroids and anticoagulation.   Vital Signs 97.6/89/20     108/73    O2 100% on 3LPM   I&O  1166/500   Abnormal labs: CO2 33, CA 8.6, HGB 9.2   Diagnostics: none new   IV and PRN Meds: Ativan  1 mg po x1, Robaxan 500mg  po x1, trazodone  50mg  po x 1, IV Solu-Medrol  80 mg x2, and IV Heparin  10.5 mL/hr continuous    Assessment/Plan:  COPD with acute exacerbation/chronic respiratory failure with hypoxia Uses 3 LPM at home. Presented with shortness of breath. Chest x-ray did not show any acute findings.  Patient was noted to be wheezing with the significant diminished air entry bilaterally.   Thought to have COPD exacerbation. COVID-19 PCR influenza PCR and RSV were negative. Started on Solu-Medrol , nebulizer treatments, budesonide , doxycycline . Respiratory status seems to be improving. Mentions that he uses oxygen at home only as needed and not on a continuous basis. Switch to oral prednisone .  Does not appear to be having any further exacerbation right now.   Transient left-sided chest pain/elevated troponin/possible NSTEMI Patient also had nonspecific EKG findings with the T wave inversions initially in the inferior leads and then in the lateral leads. Per prior  provider patient's chest pain was resolved. Patient tells me that he has chest pain located on his left side unresolved for last 1 month. Troponin levels peaked at 662 and now trending down to 147. Troponin was 10 on 06/26/2023. Thus patient suddenly had an acute event. CT PE protocol negative for pulmonary embolism. Echocardiogram shows preserved EF without any wall motion abnormality. Likely demand ischemia in the setting of a COPD flareup rather than acute cardiac event. At present plan is conservative measures. On IV heparin .  Will finish today for 48 hours.   Alcohol  abuse/polysubstance abuse On admission EtOH level was checked which was 121. Prior history of alcohol  use. Urine drug screen positive for THC. Patient mentions that he goes to his friend's place to drink. Started on CIWA protocol.  Symptoms have improved.  No withdrawal symptoms noted.    Left hand cramp No swelling noted.  No indication for imaging studies.  Muscle relaxants as needed.   Hyponatremia Monitor.   Elevated blood pressure readings Not noted to be on any antihypertensives prior to admission.  Elevated blood pressure could be due to pain issues or respiratory distress as well as alcohol  withdrawal.  Seems to have improved.  Continue to monitor.  respiratory distress as well as alcohol  withdrawal.  Seems to have improved.  Continue to monitor.   Discharge Plan:  back to LTC facility likely tomorrow.    Family Contact: Spoke with niece by phone   Goals of Care: DNR   IDT:  updated  Elouise Husband, BSN, RN, OCN [employer] [phone]

## 2023-06-29 NOTE — Progress Notes (Signed)
 [facility] Progress Note Patient: Jaime Cline FMW:[id number] DOB: [birth year] DOA: 06/26/2023  DOS: the patient was seen and examined on 06/29/2023  Brief Hospital Course: 63 y.o. male with medical history significant of advanced COPD transitioned to hospice care back in April 2024, hypertension, alcohol /polysubstance abuse, anxiety, depression, chronic back pain presenting to the ED via EMS from his nursing facility with shortness of breath.  Patient was thought to be experiencing COPD exacerbation.  There was also concern for alcohol  withdrawal.  His lactic acid level was noted to be elevated.  He was hospitalized for further management.    Assessment and plan. COPD with acute exacerbation/chronic respiratory failure with hypoxia Uses 3 LPM at home. Presented with shortness of breath. Chest x-ray did not show any acute findings.  Patient was noted to be wheezing with the significant diminished air entry bilaterally.   Thought to have COPD exacerbation. COVID-19 PCR influenza PCR and RSV were negative. Started on Solu-Medrol , nebulizer treatments, budesonide , doxycycline . Respiratory status seems to be improving. Mentions that he uses oxygen at home only as needed and not on a continuous basis. Switch to oral prednisone .  Does not appear to be having any further exacerbation right now.   Transient left-sided chest pain/elevated troponin/possible NSTEMI Patient also had nonspecific EKG findings with the T wave inversions initially in the inferior leads and then in the lateral leads. Per prior provider patient's chest pain was resolved. Patient tells me that he has chest pain located on his left side unresolved for last 1 month. Troponin levels peaked at 662 and now trending down to 147. Troponin was 10 on 06/26/2023. Thus patient suddenly had an acute event. CT PE protocol negative for pulmonary embolism. Echocardiogram shows preserved EF without any wall motion abnormality. Likely  demand ischemia in the setting of a COPD flareup rather than acute cardiac event. At present plan is conservative measures. On IV heparin .  Will finish today for 48 hours.   Lactic acidosis Chronically elevated. No further workup necessary.   Alcohol  abuse/polysubstance abuse On admission EtOH level was checked which was 121. Prior history of alcohol  use. Urine drug screen positive for THC. Patient mentions that he goes to his friend's place to drink. Started on CIWA protocol.  Symptoms have improved.  No withdrawal symptoms noted.    Left hand cramp No swelling noted.  No indication for imaging studies.  Muscle relaxants as needed.   Normocytic anemia Stable.  No evidence of overt bleeding.   Hyponatremia Monitor.   Elevated blood pressure readings Not noted to be on any antihypertensives prior to admission.  Elevated blood pressure could be due to pain issues or respiratory distress as well as alcohol  withdrawal.  Seems to have improved.  Continue to monitor.   Goals of care He is under hospice care for advanced COPD.  Noted to be on Roxanol as needed prior to admission which can be continued. He is DNR/DNI. He admits to more clarity on his goal of care.  Cardiac events are expected outcomes when COPD progresses further.  In the setting of chronic lactic acidosis patient also at risk for significant myocardial injuries.  At present he is being treated for non-STEMI.  But in future my suggestion would be expected such events as expected outcomes of his end-stage COPD and would recommend to continue to focus on comfort only with hospice.  Underweight.  Severe protein calorie malnutrition. Body mass index is 12.78 kg/m.  Placing the patient at high risk of poor outcome.  Subjective: Reports continues to have left arm pain which is burning in nature as well as left chest pain which is pleuritic in nature.  No nausea no vomiting.  Also has some cough.  Physical Exam: General:  in Mild distress, No Rash Cardiovascular: S1 and S2 Present, No Murmur Respiratory: Good respiratory effort, Bilateral Air entry present. No Crackles, No wheezes Abdomen: Bowel Sound present, No tenderness Extremities: No edema Neuro: Alert and oriented x3, no new focal deficit  Data Reviewed: I have Reviewed nursing notes, Vitals, and Lab results. Since last encounter, pertinent lab results CBC   .   Disposition: Status is: Inpatient Remains inpatient appropriate because: Will complete IV heparin  today.  Likely ready tomorrow and 1/11.  Family Communication: No one at bedside Level of care: Telemetry continue for now. Vitals:   06/29/23 0753 06/29/23 0756 06/29/23 1313 06/29/23 1550  BP:   128/85   Pulse:   (!) 103   Resp:   18 12  Temp:   98.6 F (37 C)   TempSrc:   Oral   SpO2: 99% 99% 100%   Weight:      Height:         Author: Yetta Blanch, MD 06/29/2023 4:35 PM  Please look on [email or web address] to find out who is on call.

## 2023-06-29 NOTE — TOC Progression Note (Signed)
 Transition of Care [facility]) - Progression Note   Patient Details  Name: Jaime Cline MRN: [id number] Date of Birth: 1961/02/26  [facility] of [facility]) CM/SW Contact  Duwaine GORMAN Aran, LCSW Phone Number: 06/29/2023, 9:52 AM  Clinical Narrative: Patient is expected to be medically ready for discharge back to [facility] LTC tomorrow. CSW updated Crystal in admissions at [facility]. FL2 faxed to facility in hub.   Expected Discharge Plan: Long Term Nursing Home Barriers to Discharge: Continued Medical Work up  Expected Discharge Plan and Services In-house Referral: Clinical Social Work Living arrangements for the past 2 months: Skilled Nursing Facility            DME Arranged: N/A DME Agency: NA  Social Determinants of Health (SDOH) Interventions SDOH Screenings   Food Insecurity: No Food Insecurity (06/27/2023)  Housing: Low Risk  (06/27/2023)  Transportation Needs: No Transportation Needs (06/27/2023)  Utilities: Not At Risk (06/27/2023)  Social Connections: Unknown (06/27/2023)  Tobacco Use: High Risk (06/27/2023)   Readmission Risk Interventions    06/29/2023    9:51 AM 04/12/2022    4:02 PM 01/27/2022   12:27 PM  Readmission Risk Prevention Plan  Transportation Screening Complete Complete Complete  PCP or Specialist Appt within 5-7 Days  Complete Not Complete  Not Complete comments   new PCP made for 8/28  PCP or Specialist Appt within 3-5 Days Not Complete    Not Complete comments Patient is a LTC resident at [facility].    Home Care Screening  Complete Complete  Medication Review (RN CM)  Complete Complete  HRI or Home Care Consult Complete    Social Work Consult for Recovery Care Planning/Counseling Complete    Palliative Care Screening Not Applicable    Medication Review [employer]) Complete

## 2023-06-29 NOTE — Progress Notes (Signed)
 PHARMACY - ANTICOAGULATION CONSULT NOTE  Pharmacy Consult for heparin  Indication: ACS  Allergies  Allergen Reactions   Other Other (See Comments)    BP med from [facility] about 3 years ago, caused some seizures, due to getting wrong BP med at that time.  Unknown name of med.  Allergy not listed on [facility]     Patient Measurements: Height: 5' 10 (177.8 cm) Weight: 40.4 kg (89 lb 1.1 oz) IBW/kg (Calculated) : 73 Heparin  Dosing Weight: n/a. Use total body weight of 40 kg  Vital Signs: Temp: 97.6 F (36.4 C) (01/10 0401) Temp Source: Oral (01/10 0401) BP: 108/73 (01/10 0401) Pulse Rate: 89 (01/10 0401)  Labs: Recent Labs    06/26/23 1610 06/27/23 0751 06/27/23 1314 06/27/23 2350 06/28/23 0342 06/28/23 0611 06/28/23 0744 06/28/23 1716 06/28/23 2334 06/29/23 0356  HGB 10.6* 10.6*  --   --  9.7*  --   --   --   --  9.2*  HCT 31.9* 30.8*  --   --  27.6*  --   --   --   --  27.3*  PLT 275 236  --   --  205  --   --   --   --  190  HEPARINUNFRC  --   --   --    < >  --   --  0.26* 0.28* 0.24*  --   CREATININE 0.55* 0.67  --   --   --   --   --   --   --  0.48*  TROPONINIHS 10 318* 662*  --   --  147*  --   --   --   --    < > = values in this interval not displayed.    Estimated Creatinine Clearance: 54.7 mL/min (A) (by C-G formula based on SCr of 0.48 mg/dL (L)).   Medical History: Past Medical History:  Diagnosis Date   Alcohol  abuse    Asthma    Chronic back pain    Chronic knee pain    COPD (chronic obstructive pulmonary disease) (HCC)    Hypertension     Medications: No anticoagulants PTA  Assessment: Pt is a 56 yoM with end stage COPD, followed by hospice. Pt presented to ED with SOB. CTA negative for PE. Troponins were elevated, pharmacy consulted to dose heparin  for ACS.  Consult specifies for heparin  for be continued for a total of 48 hours.   Today, 06/29/23 Heparin  level = 0.46 is therapeutic on heparin  infusion of 1050 units/hr  CBC: Hgb low but  stable, Plt WNL Confirmed with RN - no signs of bleeding  Goal of Therapy:  Heparin  level 0.3-0.7 units/ml Monitor platelets by anticoagulation protocol: Yes   Plan:  Continue heparin  infusion at 1050 units/hr No additional lab monitoring - see below  Heparin  infusion to be continued for a total duration of 48 hours, ending @ 17:00 this evening. Pharmacy to sign off.   Ronal CHRISTELLA Rav, PharmD 06/29/2023,8:05 AM

## 2023-06-29 NOTE — Plan of Care (Signed)
  Problem: Clinical Measurements: Goal: Will remain free from infection Outcome: Progressing   Problem: Clinical Measurements: Goal: Cardiovascular complication will be avoided Outcome: Progressing   Problem: Nutrition: Goal: Adequate nutrition will be maintained Outcome: Progressing   Problem: Safety: Goal: Ability to remain free from injury will improve Outcome: Progressing   Problem: Skin Integrity: Goal: Risk for impaired skin integrity will decrease Outcome: Progressing

## 2023-06-29 NOTE — Progress Notes (Signed)
 PHARMACY - ANTICOAGULATION CONSULT NOTE  Pharmacy Consult for heparin  Indication: ACS  Allergies  Allergen Reactions   Other Other (See Comments)    BP med from [facility] about 3 years ago, caused some seizures, due to getting wrong BP med at that time.  Unknown name of med.  Allergy not listed on [facility]     Patient Measurements: Height: 5' 10 (177.8 cm) Weight: 40.4 kg (89 lb 1.1 oz) IBW/kg (Calculated) : 73 Heparin  Dosing Weight: n/a. Use total body weight of 40 kg  Vital Signs: Temp: 98.8 F (37.1 C) (01/09 2029) Temp Source: Oral (01/09 2029) BP: 131/76 (01/09 2029) Pulse Rate: 109 (01/09 2029)  Labs: Recent Labs    06/26/23 1610 06/27/23 0751 06/27/23 1314 06/27/23 2350 06/28/23 0342 06/28/23 0611 06/28/23 0744 06/28/23 1716 06/28/23 2334  HGB 10.6* 10.6*  --   --  9.7*  --   --   --   --   HCT 31.9* 30.8*  --   --  27.6*  --   --   --   --   PLT 275 236  --   --  205  --   --   --   --   HEPARINUNFRC  --   --   --    < >  --   --  0.26* 0.28* 0.24*  CREATININE 0.55* 0.67  --   --   --   --   --   --   --   TROPONINIHS 10 318* 662*  --   --  147*  --   --   --    < > = values in this interval not displayed.    Estimated Creatinine Clearance: 54.7 mL/min (by C-G formula based on SCr of 0.67 mg/dL).   Medical History: Past Medical History:  Diagnosis Date   Alcohol  abuse    Asthma    Chronic back pain    Chronic knee pain    COPD (chronic obstructive pulmonary disease) (HCC)    Hypertension     Medications: No anticoagulants PTA  Assessment: Pt is a 39 yoM with end stage COPD, followed by hospice. Pt presented to ED with SOB. CTA negative for PE. Troponins were elevated, pharmacy consulted to dose heparin  for ACS.  Today, 06/29/23 Heparin  level 0.24 - remains subtherapeutic on heparin  infusion after increase to 950 units/hr No complications of therapy noted   Goal of Therapy:  Heparin  level 0.3-0.7 units/ml Monitor platelets by anticoagulation  protocol: Yes   Plan:  Increase heparin  infusion to 1050 units/hr Check heparin  level 6-8 hours after rate increase CBC, heparin  level daily Monitor for signs of bleeding  Leeroy Mace RPh 06/29/2023, 12:38 AM

## 2023-06-30 DIAGNOSIS — J441 Chronic obstructive pulmonary disease with (acute) exacerbation: Secondary | ICD-10-CM | POA: Diagnosis not present

## 2023-06-30 MED ORDER — ASPIRIN 81 MG PO TBEC
81.0000 mg | DELAYED_RELEASE_TABLET | Freq: Every day | ORAL | Status: DC
  Administered 2023-06-30 – 2023-07-01 (×2): 81 mg via ORAL
  Filled 2023-06-30 (×2): qty 1

## 2023-06-30 NOTE — Discharge Summary (Addendum)
 Physician Discharge Summary   Patient: Jaime Cline MRN: [id number] DOB: August 02, 1960  Admit date:     06/26/2023  Discharge date: 06/30/23  Discharge Physician: Jaime Blanch  PCP: Patient, [name]  Recommendations at discharge: Establish care with hospice.  Discussion about clarity on goals of care.    Contact information for follow-up providers     [facility] Follow up.   Specialty: Hospice and Palliative Medicine Why: resume hospice care. Contact information: [address] [facility] [place]  [address] [phone]             Contact information for after-discharge care     Destination     [facility] .   Service: Skilled Nursing Contact information: [address] [place]   [address] [phone]                   Discharge Diagnoses: Principal Problem:   COPD with acute exacerbation (HCC) Active Problems:   Hyponatremia   Depression   Lactic acidosis   Chest pain   Alcohol  withdrawal (HCC)   Substance abuse (HCC)   Normocytic anemia  Hospital Course: Brief Hospital Course: 63 y.o. male with medical history significant of advanced COPD transitioned to hospice care back in April 2024, hypertension, alcohol /polysubstance abuse, anxiety, depression, chronic back pain presenting to the ED via EMS from his nursing facility with shortness of breath.  Patient was thought to be experiencing COPD exacerbation.  There was also concern for alcohol  withdrawal.  His lactic acid level was noted to be elevated.  He was hospitalized for further management.     Assessment and plan. COPD with acute exacerbation/chronic respiratory failure with hypoxia Uses 3 LPM at home. Presented with shortness of breath. Chest x-ray did not show any acute findings.  Patient was noted to be wheezing with the significant diminished air entry bilaterally.   Thought to have COPD exacerbation. COVID-19 PCR influenza PCR and RSV were  negative. Started on Solu-Medrol , nebulizer treatments, budesonide , doxycycline . Respiratory status seems to be improving. Mentions that he uses oxygen at home only as needed and not on a continuous basis. Switch to oral prednisone .  Does not appear to be having any further exacerbation right now.   Transient left-sided chest pain/elevated troponin/possible NSTEMI Patient also had nonspecific EKG findings with the T wave inversions initially in the inferior leads and then in the lateral leads. Per prior provider patient's chest pain was resolved. Patient tells me that he has chest pain located on his left side unresolved for last 1 month. Troponin levels peaked at 662 and now trending down to 147. Troponin was 10 on 06/26/2023. Thus patient suddenly had an acute event. CT PE protocol negative for pulmonary embolism. Echocardiogram shows preserved EF without any wall motion abnormality. Likely demand ischemia in the setting of a COPD flareup rather than acute cardiac event. At present plan is conservative measures. On IV heparin .  Finished 48 hours on 1/10   Lactic acidosis Chronically elevated. No further workup necessary.   Alcohol  abuse/polysubstance abuse On admission EtOH level was checked which was 121. Prior history of alcohol  use. Urine drug screen positive for THC. Patient mentions that he goes to his friend's place to drink. Started on CIWA protocol.  Symptoms have improved.  No withdrawal symptoms noted.    Left hand cramp No swelling noted.  No indication for imaging studies.  Muscle relaxants as needed.   Normocytic anemia Stable.  No evidence of overt bleeding.   Hyponatremia Monitor.  Hypertension. Not noted to be on any antihypertensives prior to admission.  Elevated blood pressure could be due to pain issues or respiratory distress as well as alcohol  withdrawal.  Seems to have improved.  Continue to monitor.   Goals of care He is under hospice care for  advanced COPD.  Noted to be on Roxanol as needed prior to admission which can be continued. He is DNR/DNI. He needs more clarity on his goal of care. Cardiac events are expected outcomes when COPD progresses further. In the setting of chronic lactic acidosis patient also at risk for significant myocardial injuries.  At present he is being treated for non-STEMI.  But in future my suggestion would be expected such events as expected outcomes of his end-stage COPD and would recommend to continue to focus on comfort only with hospice.  Addendum: We discussed about the goals of care. Pt understand that his copd is progressing. Asked me how long he has and can he 'keep going for another year or two'.  Given that he presented with COPD exacerbation with demand ischemia. There is a high likely hood that he may have recurrence of the symptoms in a few months. It is impossible to guess how that recurrence will be but it can be potentially life threatening. I have informed him that best course of action would be to focus on symptoms at that point.  He also has more continuous chest pain due to his COPD since last few months and will benefit from using morphine  on more regular basis. Told me that it helped his pain. He may even benefit from using something on scheduled basis for pain and anxiety. Will defer to reval by hospice when they see him next week.   Jaime Cline 10:15 AM 06/30/2023    Underweight.  Severe protein calorie malnutrition. Related to COPD Body mass index is 12.78 kg/m.  Placing the patient at high risk of poor outcome.  Pain control - [place]  Controlled Substance Reporting System database was reviewed. and patient was instructed, not to drive, operate heavy machinery, perform activities at heights, swimming or participation in water activities or provide baby-sitting services while on Pain, Sleep and Anxiety Medications; until their outpatient Physician has advised to do so again.  Also recommended to not to take more than prescribed Pain, Sleep and Anxiety Medications.  Consultants:  none  Procedures performed:  Echocardiogram   DISCHARGE MEDICATION: Allergies as of 06/30/2023       Reactions   Other Other (See Comments)   BP med from [facility] about 3 years ago, caused some seizures, due to getting wrong BP med at that time.  Unknown name of med.  Allergy not listed on MAR         Medication List     STOP taking these medications    cefTRIAXone  1 g injection Commonly known as: ROCEPHIN        TAKE these medications    acetaminophen  500 MG tablet Commonly known as: TYLENOL  Take 500 mg by mouth in the morning, at noon, and at bedtime.   albuterol  108 (90 Base) MCG/ACT inhaler Commonly known as: VENTOLIN  HFA Inhale 1-2 puffs into the lungs every 6 (six) hours as needed for wheezing. What changed:  how much to take when to take this reasons to take this   aspirin  EC 81 MG tablet Take 1 tablet (81 mg total) by mouth daily. Swallow whole.   barrier cream Crea Commonly known as: non-specified Apply 1 Application topically See  admin instructions. 1 application to peri area every shift with all incontinence care   doxycycline  100 MG tablet Commonly known as: VIBRA -TABS Take 1 tablet (100 mg total) by mouth 2 (two) times daily for 2 days.   folic acid  1 MG tablet Commonly known as: FOLVITE  Take 1 tablet (1 mg total) by mouth daily.   gabapentin  300 MG capsule Commonly known as: NEURONTIN  Take 2 capsules (600 mg total) by mouth 3 (three) times daily. What changed:  medication strength how much to take Another medication with the same name was removed. Continue taking this medication, and follow the directions you see here.   ipratropium-albuterol  0.5-2.5 (3) MG/3ML Soln Commonly known as: DUONEB Take 3 ml by nebulizer once daily for 5 days starting 06/26/23, then 3 ml by nebulizer twice daily What changed: additional instructions    loperamide  2 MG tablet Commonly known as: IMODIUM  A-D Take 2 mg by mouth every 6 (six) hours as needed for diarrhea or loose stools.   Menthol -Camphor 3.5-0.8 % Gel Apply 1 application  topically daily. Apply to entire LUE PRN order: 1 application to knees twice daily as needed for pain   morphine  20 MG/ML concentrated solution Commonly known as: ROXANOL Take 0.25 mLs (5 mg total) by mouth every 8 (eight) hours as needed for shortness of breath or anxiety (pain). What changed: reasons to take this   nicotine  14 mg/24hr patch Commonly known as: NICODERM CQ  - dosed in mg/24 hours Place 1 patch (14 mg total) onto the skin daily.   nicotine  polacrilex 2 MG gum Commonly known as: NICORETTE Take 2 mg by mouth every 8 (eight) hours as needed for smoking cessation.   OXYGEN Inhale 2 L into the lungs continuous.   predniSONE  10 MG tablet Commonly known as: DELTASONE  Take 4 tablets by mouth daily for 3 days, THEN take 3 tablets daily for 3 days, THEN take 2 tablets  daily for 3 days, THEN 1 tablet daily for 3 days. then stop. Start taking on: June 30, 2023   sertraline  50 MG tablet Commonly known as: ZOLOFT  Take 1 tablet (50 mg total) by mouth daily. What changed: when to take this   traZODone  50 MG tablet Commonly known as: DESYREL  Take 1 tablet (50 mg total) by mouth at bedtime as needed for sleep. What changed:  how much to take when to take this       Disposition: SNF Diet recommendation: Regular diet  Discharge Exam: Vitals:   06/29/23 2046 06/29/23 2121 06/30/23 0529 06/30/23 0836  BP:  128/87 115/83   Pulse:  99 76   Resp:  18 19   Temp:  98.9 F (37.2 C) 97.9 F (36.6 C)   TempSrc:  Oral Oral   SpO2: 98% 100% 100% 97%  Weight:      Height:       General: Appear in mild distress; no visible Abnormal Neck Mass Or lumps, Conjunctiva normal Cardiovascular: S1 and S2 Present, no Murmur, Respiratory: good respiratory effort, Bilateral Air entry present and  CTA, no Crackles, no wheezes Abdomen: Bowel Sound present, Non tender  Extremities: no Pedal edema Neurology: alert and oriented to time, place, and person  Filed Weights   06/26/23 1602 06/27/23 1507  Weight: 41.7 kg 40.4 kg   Condition at discharge: stable  The results of significant diagnostics from this hospitalization (including imaging, microbiology, ancillary and laboratory) are listed below for reference.   Imaging Studies: CT Angio Chest Pulmonary Embolism (PE) W or WO  Contrast Result Date: 06/27/2023 CLINICAL DATA:  Chest pain dyspnea EXAM: CT ANGIOGRAPHY CHEST WITH CONTRAST TECHNIQUE: Multidetector CT imaging of the chest was performed using the standard protocol during bolus administration of intravenous contrast. Multiplanar CT image reconstructions and MIPs were obtained to evaluate the vascular anatomy. RADIATION DOSE REDUCTION: This exam was performed according to the departmental dose-optimization program which includes automated exposure control, adjustment of the mA and/or kV according to patient size and/or use of iterative reconstruction technique. CONTRAST:  75mL OMNIPAQUE  IOHEXOL  350 MG/ML SOLN COMPARISON:  Chest x-ray 06/26/2023, CT chest 08/27/2021 FINDINGS: Cardiovascular: Satisfactory opacification of the pulmonary arteries to the segmental level. No evidence of pulmonary embolism. Normal heart size. No pericardial effusion. Nonaneurysmal aorta. Mild atherosclerosis. No dissection. Coronary artery calcification Mediastinum/Nodes: Trachea. No thyroid mass. No suspicious lymph nodes. Esophagus within normal limits Lungs/Pleura: Emphysema. No acute airspace disease, pleural effusion or pneumothorax. Upper Abdomen: No acute finding Musculoskeletal: No acute osseous abnormality Review of the MIP images confirms the above findings. IMPRESSION: 1. Negative for acute pulmonary embolus or aortic dissection. 2. Emphysema. 3. Aortic atherosclerosis. Aortic Atherosclerosis (ICD10-I70.0)  and Emphysema (ICD10-J43.9). Electronically Signed   By: Luke Bun M.D.   On: 06/27/2023 19:48   ECHOCARDIOGRAM COMPLETE Result Date: 06/27/2023    ECHOCARDIOGRAM REPORT   Patient Name:   Jaime Cline Date of Exam: 06/27/2023 Medical Rec #:  [id number]    Height:       70.0 in Accession #:    [id number]   Weight:       92.0 lb Date of Birth:  1960/10/23    BSA:          1.501 m Patient Age:    62 years     BP:           123/67 mmHg Patient Gender: M            HR:           90 bpm. Exam Location:  Inpatient Procedure: 2D Echo, Cardiac Doppler and Color Doppler Indications:    CP  History:        Patient has prior history of Echocardiogram examinations, most                 recent 08/28/2021. COPD, Signs/Symptoms:Dyspnea, Hypotension and                 Syncope; Risk Factors:Hypertension.  Sonographer:    Juanita Shaw Referring Phys: [id number] GOKUL KRISHNAN IMPRESSIONS  1. Left ventricular ejection fraction, by estimation, is 60 to 65%. The left ventricle has normal function. The left ventricle has no regional wall motion abnormalities. Left ventricular diastolic parameters are consistent with Grade I diastolic dysfunction (impaired relaxation).  2. Right ventricular systolic function is normal. The right ventricular size is normal. Tricuspid regurgitation signal is inadequate for assessing PA pressure.  3. The mitral valve is grossly normal. No evidence of mitral valve regurgitation.  4. The aortic valve was not well visualized. Aortic valve regurgitation is not visualized.  5. The inferior vena cava is normal in size with greater than 50% respiratory variability, suggesting right atrial pressure of 3 mmHg. FINDINGS  Left Ventricle: Left ventricular ejection fraction, by estimation, is 60 to 65%. The left ventricle has normal function. The left ventricle has no regional wall motion abnormalities. The left ventricular internal cavity size was normal in size. There is  no left ventricular hypertrophy. Left ventricular  diastolic parameters are consistent with Grade I diastolic dysfunction (impaired  relaxation). Right Ventricle: The right ventricular size is normal. Right ventricular systolic function is normal. Tricuspid regurgitation signal is inadequate for assessing PA pressure. Left Atrium: Left atrial size was normal in size. Right Atrium: Right atrial size was normal in size. Pericardium: There is no evidence of pericardial effusion. Mitral Valve: The mitral valve is grossly normal. No evidence of mitral valve regurgitation. MV peak gradient, 2.0 mmHg. The mean mitral valve gradient is 1.0 mmHg. Tricuspid Valve: Tricuspid valve regurgitation is not demonstrated. Aortic Valve: The aortic valve was not well visualized. Aortic valve regurgitation is not visualized. Aortic valve mean gradient measures 2.0 mmHg. Aortic valve peak gradient measures 3.5 mmHg. Aortic valve area, by VTI measures 1.80 cm. Pulmonic Valve: Pulmonic valve regurgitation is not visualized. Aorta: The aortic root and ascending aorta are structurally normal, with no evidence of dilitation. Venous: The inferior vena cava is normal in size with greater than 50% respiratory variability, suggesting right atrial pressure of 3 mmHg. IAS/Shunts: The interatrial septum was not well visualized.  LEFT VENTRICLE PLAX 2D LVIDd:         4.10 cm      Diastology LVIDs:         2.90 cm      LV e' medial:    9.03 cm/s LV PW:         0.60 cm      LV E/e' medial:  4.1 LV IVS:        0.50 cm      LV e' lateral:   9.90 cm/s LVOT diam:     1.70 cm      LV E/e' lateral: 3.7 LV SV:         31 LV SV Index:   21 LVOT Area:     2.27 cm  LV Volumes (MOD) LV vol d, MOD A2C: 96.6 ml LV vol d, MOD A4C: 117.0 ml LV vol s, MOD A2C: 37.5 ml LV vol s, MOD A4C: 44.4 ml LV SV MOD A2C:     59.1 ml LV SV MOD A4C:     117.0 ml LV SV MOD BP:      72.9 ml RIGHT VENTRICLE             IVC RV Basal diam:  3.10 cm     IVC diam: 1.70 cm RV Mid diam:    2.00 cm RV S prime:     15.30 cm/s TAPSE (M-mode):  2.2 cm LEFT ATRIUM             Index        RIGHT ATRIUM          Index LA Vol (A2C):   24.8 ml 16.53 ml/m  RA Area:     9.29 cm LA Vol (A4C):   37.0 ml 24.66 ml/m  RA Volume:   18.80 ml 12.53 ml/m LA Biplane Vol: 32.8 ml 21.86 ml/m  AORTIC VALVE                    PULMONIC VALVE AV Area (Vmax):    1.35 cm     PV Vmax:       0.88 m/s AV Area (Vmean):   1.38 cm     PV Peak grad:  3.1 mmHg AV Area (VTI):     1.80 cm AV Vmax:           93.00 cm/s AV Vmean:          59.100 cm/s AV VTI:  0.174 m AV Peak Grad:      3.5 mmHg AV Mean Grad:      2.0 mmHg LVOT Vmax:         55.40 cm/s LVOT Vmean:        35.900 cm/s LVOT VTI:          0.138 m LVOT/AV VTI ratio: 0.79  AORTA Ao Root diam: 2.50 cm MITRAL VALVE MV Area (PHT): 5.42 cm    SHUNTS MV Area VTI:   2.96 cm    Systemic VTI:  0.14 m MV Peak grad:  2.0 mmHg    Systemic Diam: 1.70 cm MV Mean grad:  1.0 mmHg MV Vmax:       0.72 m/s MV Vmean:      46.7 cm/s MV Decel Time: 140 msec MV E velocity: 36.70 cm/s MV A velocity: 52.80 cm/s MV E/A ratio:  0.70 [place] signed by Ronal Ross Signature Date/Time: 06/27/2023/5:14:40 PM    Final    DG Chest Port 1 View Result Date: 06/26/2023 CLINICAL DATA:  Shortness of breath. Diminished lung sounds. Hypoxia EXAM: PORTABLE CHEST 1 VIEW COMPARISON:  10/31/2022 FINDINGS: Normal sized heart. Mildly calcified thoracic aorta. The lungs remain hyperexpanded and clear. No acute bony abnormality. IMPRESSION: 1. No acute abnormality. 2. COPD. Electronically Signed   By: Elspeth Bathe M.D.   On: 06/26/2023 17:09    Microbiology: Results for orders placed or performed during the hospital encounter of 06/26/23  Resp panel by RT-PCR (RSV, Flu A&B, Covid) Anterior Nasal Swab     Status: None   Collection Time: 06/26/23  4:18 PM   Specimen: Anterior Nasal Swab  Result Value Ref Range Status   SARS Coronavirus 2 by RT PCR NEGATIVE NEGATIVE Final    Comment: (NOTE) SARS-CoV-2 target nucleic acids are NOT  DETECTED.  The SARS-CoV-2 RNA is generally detectable in upper respiratory specimens during the acute phase of infection. The lowest concentration of SARS-CoV-2 viral copies this assay can detect is 138 copies/mL. A negative result does not preclude SARS-Cov-2 infection and should not be used as the sole basis for treatment or other patient management decisions. A negative result may occur with  improper specimen collection/handling, submission of specimen other than nasopharyngeal swab, presence of viral mutation(s) within the areas targeted by this assay, and inadequate number of viral copies(<138 copies/mL). A negative result must be combined with clinical observations, patient history, and epidemiological information. The expected result is Negative.  Fact Sheet for Patients:  bloggercourse.com  Fact Sheet for Healthcare Providers:  seriousbroker.it  This test is no t yet approved or cleared by the [place]  FDA and  has been authorized for detection and/or diagnosis of SARS-CoV-2 by FDA under an Emergency Use Authorization (EUA). This EUA will remain  in effect (meaning this test can be used) for the duration of the COVID-19 declaration under Section 564(b)(1) of the Act, 21 U.S.C.section 360bbb-3(b)(1), unless the authorization is terminated  or revoked sooner.       Influenza A by PCR NEGATIVE NEGATIVE Final   Influenza B by PCR NEGATIVE NEGATIVE Final    Comment: (NOTE) The Xpert Xpress SARS-CoV-2/FLU/RSV plus assay is intended as an aid in the diagnosis of influenza from Nasopharyngeal swab specimens and should not be used as a sole basis for treatment. Nasal washings and aspirates are unacceptable for Xpert Xpress SARS-CoV-2/FLU/RSV testing.  Fact Sheet for Patients: bloggercourse.com  Fact Sheet for Healthcare Providers: seriousbroker.it  This test is not yet  approved  or cleared by the [place]  FDA and has been authorized for detection and/or diagnosis of SARS-CoV-2 by FDA under an Emergency Use Authorization (EUA). This EUA will remain in effect (meaning this test can be used) for the duration of the COVID-19 declaration under Section 564(b)(1) of the Act, 21 U.S.C. section 360bbb-3(b)(1), unless the authorization is terminated or revoked.     Resp Syncytial Virus by PCR NEGATIVE NEGATIVE Final    Comment: (NOTE) Fact Sheet for Patients: bloggercourse.com  Fact Sheet for Healthcare Providers: seriousbroker.it  This test is not yet approved or cleared by the [place]  FDA and has been authorized for detection and/or diagnosis of SARS-CoV-2 by FDA under an Emergency Use Authorization (EUA). This EUA will remain in effect (meaning this test can be used) for the duration of the COVID-19 declaration under Section 564(b)(1) of the Act, 21 U.S.C. section 360bbb-3(b)(1), unless the authorization is terminated or revoked.  Performed at [facility], [address], [place], [place] [address]   Culture, blood (routine x 2)     Status: None (Preliminary result)   Collection Time: 06/27/23 12:20 AM   Specimen: BLOOD  Result Value Ref Range Status   Specimen Description   Final    BLOOD LEFT UNDERARM Performed at [facility], [address] [place], [place] [address]    Special Requests   Final    BOTTLES DRAWN AEROBIC AND ANAEROBIC Blood Culture adequate volume Performed at [facility], [address] [place], [place] [address]    Culture   Final    NO GROWTH 3 DAYS Performed at [facility], [address], [place], [place] [address]    Report Status PENDING  Incomplete  Culture, blood (routine x 2)     Status: None (Preliminary result)   Collection Time: 06/27/23 12:45 AM   Specimen: BLOOD  Result Value Ref  Range Status   Specimen Description   Final    BLOOD BLOOD RIGHT FOREARM Performed at [facility], [address], [place], [place] [address]    Special Requests   Final    BOTTLES DRAWN AEROBIC AND ANAEROBIC Blood Culture adequate volume Performed at [facility], [address], [place], [place] [address]    Culture   Final    NO GROWTH 3 DAYS Performed at [facility], [address], [place], [place] [address]    Report Status PENDING  Incomplete   Labs: CBC: Recent Labs  Lab 06/26/23 1610 06/27/23 0751 06/28/23 0342 06/29/23 0356  WBC 4.6 9.8 3.9* 4.9  NEUTROABS 2.0  --   --   --   HGB 10.6* 10.6* 9.7* 9.2*  HCT 31.9* 30.8* 27.6* 27.3*  MCV 96.4 93.6 91.4 93.8  PLT 275 236 205 190   Basic Metabolic Panel: Recent Labs  Lab 06/26/23 1610 06/27/23 0751 06/27/23 1314 06/28/23 0342 06/29/23 0356  NA 134* 134*  --   --  133*  K 4.5 4.2  --   --  4.2  CL 91* 92*  --   --  96*  CO2 31 30  --   --  33*  GLUCOSE 79 104*  --   --  135*  BUN 13 14  --   --  18  CREATININE 0.55* 0.67  --   --  0.48*  CALCIUM  8.7* 9.1  --   --  8.6*  MG  --   --  2.0 1.9  1.8   Liver Function Tests: Recent Labs  Lab 06/26/23 1610  AST 36  ALT 16  ALKPHOS 56  BILITOT 0.6  PROT 6.8  ALBUMIN 4.0   CBG: No results for input(s): GLUCAP in the last 168 hours.  Discharge time spent: greater than 30 minutes.  Author: Yetta Blanch, MD  [facility]

## 2023-06-30 NOTE — Plan of Care (Signed)
  Problem: Education: Goal: Knowledge of General Education information will improve Description: Including pain rating scale, medication(s)/side effects and non-pharmacologic comfort measures Outcome: Progressing   Problem: Health Behavior/Discharge Planning: Goal: Ability to manage health-related needs will improve Outcome: Progressing   Problem: Clinical Measurements: Goal: Ability to maintain clinical measurements within normal limits will improve Outcome: Progressing Goal: Will remain free from infection Outcome: Progressing Goal: Diagnostic test results will improve Outcome: Progressing Goal: Respiratory complications will improve Outcome: Progressing Goal: Cardiovascular complication will be avoided Outcome: Progressing   Problem: Activity: Goal: Risk for activity intolerance will decrease Outcome: Progressing   Problem: Nutrition: Goal: Adequate nutrition will be maintained Outcome: Progressing   Problem: Coping: Goal: Level of anxiety will decrease Outcome: Progressing   Problem: Elimination: Goal: Will not experience complications related to bowel motility Outcome: Progressing Goal: Will not experience complications related to urinary retention Outcome: Progressing   Problem: Pain Management: Goal: General experience of comfort will improve Outcome: Progressing   Problem: Safety: Goal: Ability to remain free from injury will improve Outcome: Progressing   Problem: Skin Integrity: Goal: Risk for impaired skin integrity will decrease Outcome: Progressing   Problem: Education: Goal: Knowledge of disease or condition will improve Outcome: Progressing Goal: Knowledge of the prescribed therapeutic regimen will improve Outcome: Progressing Goal: Individualized Educational Video(s) Outcome: Progressing   Problem: Activity: Goal: Ability to tolerate increased activity will improve Outcome: Progressing Goal: Will verbalize the importance of balancing  activity with adequate rest periods Outcome: Progressing   Problem: Respiratory: Goal: Ability to maintain a clear airway will improve Outcome: Progressing Goal: Levels of oxygenation will improve Outcome: Progressing Goal: Ability to maintain adequate ventilation will improve Outcome: Progressing  Pt is at his baseline for oxygen needs

## 2023-06-30 NOTE — TOC Progression Note (Signed)
 Transition of Care [facility]) - Progression Note    Patient Details  Name: Jaime Cline MRN: [id number] Date of Birth: 1960-10-21  Transition of Care [facility]) CM/SW Contact  Tawni CHRISTELLA Eva, LCSW Phone Number: 06/30/2023, 10:19 AM  Clinical Narrative:    CSW spoke with Kristal from [facility]. She reported that due to limited staff, the pt cannot be admitted today but can come tomorrow. TOC to follow.   Expected Discharge Plan: Long Term Nursing Home Barriers to Discharge: Barriers Resolved  Expected Discharge Plan and Services In-house Referral: Clinical Social Work     Living arrangements for the past 2 months: Skilled Nursing Facility Expected Discharge Date: 06/30/23               DME Arranged: N/A DME Agency: NA                   Social Determinants of Health (SDOH) Interventions SDOH Screenings   Food Insecurity: No Food Insecurity (06/27/2023)  Housing: Low Risk  (06/27/2023)  Transportation Needs: No Transportation Needs (06/27/2023)  Utilities: Not At Risk (06/27/2023)  Social Connections: Unknown (06/27/2023)  Tobacco Use: High Risk (06/27/2023)    Readmission Risk Interventions    06/29/2023    9:51 AM 04/12/2022    4:02 PM 01/27/2022   12:27 PM  Readmission Risk Prevention Plan  Transportation Screening Complete Complete Complete  PCP or Specialist Appt within 5-7 Days  Complete Not Complete  Not Complete comments   new PCP made for 8/28  PCP or Specialist Appt within 3-5 Days Not Complete    Not Complete comments Patient is a LTC resident at [facility].    Home Care Screening  Complete Complete  Medication Review (RN CM)  Complete Complete  HRI or Home Care Consult Complete    Social Work Consult for Recovery Care Planning/Counseling Complete    Palliative Care Screening Not Applicable    Medication Review Oceanographer) Complete

## 2023-06-30 NOTE — Plan of Care (Signed)

## 2023-06-30 NOTE — Progress Notes (Signed)
 [facility] Hospitalized Hospice Patient   Jaime Cline is a current hospice patient followed at [facility] with a terminal diagnosis of COPD.  [facility] was notified by facility that patient was being transferred to the ED. Patient presented with shortness of breath and concern for COPD exacerbation. Per Dr. Ephriam Cline, hospice MD, this is a related hospital admission.     Patient is GIP appropriate for continued clinical monitoring of post COPD exacerbation and weaning IV steroids and anticoagulation.   Vital Signs 97.9/76/19     115/83    O2 100% on 6LPM   I&O  783/1,010   Abnormal labs: none new   Diagnostics: none new   IV and PRN Meds: Morphine  5mg  PO x1   Problem List per  Jaime Yetta HERO, MD  on 1.10.25: Assessment and plan. COPD with acute exacerbation/chronic respiratory failure with hypoxia Uses 3 LPM at home. Presented with shortness of breath. Chest x-ray did not show any acute findings.  Patient was noted to be wheezing with the significant diminished air entry bilaterally.   Thought to have COPD exacerbation. COVID-19 PCR influenza PCR and RSV were negative. Started on Solu-Medrol , nebulizer treatments, budesonide , doxycycline . Respiratory status seems to be improving. Mentions that he uses oxygen at home only as needed and not on a continuous basis. Switch to oral prednisone .  Does not appear to be having any further exacerbation right now.   Transient left-sided chest pain/elevated troponin/possible NSTEMI Patient also had nonspecific EKG findings with the T wave inversions initially in the inferior leads and then in the lateral leads. Per prior provider patient's chest pain was resolved. Patient tells me that he has chest pain located on his left side unresolved for last 1 month. Troponin levels peaked at 662 and now trending down to 147. Troponin was 10 on 06/26/2023. Thus patient suddenly had an acute event. CT PE protocol negative for  pulmonary embolism. Echocardiogram shows preserved EF without any wall motion abnormality. Likely demand ischemia in the setting of a COPD flareup rather than acute cardiac event. At present plan is conservative measures. On IV heparin .  Will finish today for 48 hours.   Lactic acidosis Chronically elevated. No further workup necessary.   Alcohol  abuse/polysubstance abuse On admission EtOH level was checked which was 121. Prior history of alcohol  use. Urine drug screen positive for THC. Patient mentions that he goes to his friend's place to drink. Started on CIWA protocol.  Symptoms have improved.  No withdrawal symptoms noted.    Left hand cramp No swelling noted.  No indication for imaging studies.  Muscle relaxants as needed.   Normocytic anemia Stable.  No evidence of overt bleeding.   Hyponatremia Monitor.   Elevated blood pressure readings Not noted to be on any antihypertensives prior to admission.  Elevated blood pressure could be due to pain issues or respiratory distress as well as alcohol  withdrawal.  Seems to have improved.  Continue to monitor.   Discharge Plan:  back to LTC facility likely tomorrow   Family Contact: Spoke with niece by phone   Goals of Care: DNR   IDT:  updated   Jaime Cline [facility] Liaison [phone]

## 2023-07-01 MED ORDER — DOCUSATE SODIUM 100 MG PO CAPS
100.0000 mg | ORAL_CAPSULE | Freq: Two times a day (BID) | ORAL | Status: DC
  Administered 2023-07-01: 100 mg via ORAL
  Filled 2023-07-01: qty 1

## 2023-07-01 NOTE — Progress Notes (Signed)
 Contacted by receiving RN at [facility].  Report given.

## 2023-07-01 NOTE — Progress Notes (Signed)
 Number to call report provided in TOC DC note.was not answered x2.  TOC contact advised.  Was told by Camellia HUGHS that my number was provided to the facility and if I did not hear from them then patient may still depart per facility admissions director as he is a returning resident.

## 2023-07-01 NOTE — TOC Transition Note (Addendum)
 Transition of Care [facility]) - Discharge Note   Patient Details  Name: Jaime Cline MRN: [id number] Date of Birth: 19-Jul-1960  Transition of Care [facility]) CM/SW Contact:  Dalila Camellia SAUNDERS, LCSW Phone Number: 07/01/2023, 11:57 AM   Clinical Narrative:     CSW confirmed with Crystal at [facility] that patient can return today.  Patient to be d/c'ed today to [place] room 318.  Patient and family agreeable to plans will transport via ems RN to call report to [phone].  1247 CSW called EMS at [phone] to set up transport through [facility] since patient is current patient of [facility] for hospice services.   Final next level of care: Skilled Nursing Facility Barriers to Discharge: Barriers Resolved   Patient Goals and CMS Choice Patient states their goals for this hospitalization and ongoing recovery are:: To return back to [place] with hospice care. CMS Medicare.gov Compare Post Acute Care list provided to:: Patient Represenative (must comment) Choice offered to / list presented to : [facility] POA / Guardian [place] ownership interest in [facility].provided to:: [facility] POA / Guardian    Discharge Placement  [facility] where he is LTC resident.                  Patient and family notified of of transfer: 07/01/23  Discharge Plan and Services Additional resources added to the After Visit Summary for   In-house Referral: Clinical Social Work              DME Arranged: N/A DME Agency: NA                  Social Drivers of Health (SDOH) Interventions SDOH Screenings   Food Insecurity: No Food Insecurity (06/27/2023)  Housing: Low Risk  (06/27/2023)  Transportation Needs: No Transportation Needs (06/27/2023)  Utilities: Not At Risk (06/27/2023)  Social Connections: Unknown (06/27/2023)  Tobacco Use: High Risk (06/27/2023)     Readmission Risk Interventions    06/29/2023    9:51 AM 04/12/2022    4:02 PM 01/27/2022   12:27 PM  Readmission Risk  Prevention Plan  Transportation Screening Complete Complete Complete  PCP or Specialist Appt within 5-7 Days  Complete Not Complete  Not Complete comments   new PCP made for 8/28  PCP or Specialist Appt within 3-5 Days Not Complete    Not Complete comments Patient is a LTC resident at [facility].    Home Care Screening  Complete Complete  Medication Review (RN CM)  Complete Complete  HRI or Home Care Consult Complete    Social Work Consult for Recovery Care Planning/Counseling Complete    Palliative Care Screening Not Applicable    Medication Review Oceanographer) Complete

## 2023-07-02 LAB — CULTURE, BLOOD (ROUTINE X 2)
Culture: NO GROWTH
Culture: NO GROWTH
Special Requests: ADEQUATE
Special Requests: ADEQUATE

## 2023-07-10 ENCOUNTER — Other Ambulatory Visit (HOSPITAL_COMMUNITY): Payer: Self-pay

## 2023-07-12 ENCOUNTER — Other Ambulatory Visit (HOSPITAL_COMMUNITY): Payer: Self-pay

## 2023-08-04 ENCOUNTER — Other Ambulatory Visit (HOSPITAL_COMMUNITY): Payer: Self-pay

## 2023-08-05 ENCOUNTER — Other Ambulatory Visit (HOSPITAL_COMMUNITY): Payer: Self-pay

## 2023-08-08 ENCOUNTER — Other Ambulatory Visit (HOSPITAL_COMMUNITY): Payer: Self-pay

## 2023-08-23 ENCOUNTER — Other Ambulatory Visit (HOSPITAL_COMMUNITY): Payer: Self-pay

## 2023-10-08 ENCOUNTER — Emergency Department (HOSPITAL_COMMUNITY)

## 2023-10-08 ENCOUNTER — Other Ambulatory Visit: Payer: Self-pay

## 2023-10-08 ENCOUNTER — Encounter (HOSPITAL_COMMUNITY): Payer: Self-pay | Admitting: Radiology

## 2023-10-08 ENCOUNTER — Emergency Department (HOSPITAL_COMMUNITY)
Admission: EM | Admit: 2023-10-08 | Discharge: 2023-10-09 | Disposition: A | Discharge status: Home / Self Care | Attending: Emergency Medicine | Admitting: Emergency Medicine

## 2023-10-08 DIAGNOSIS — R0602 Shortness of breath: Secondary | ICD-10-CM | POA: Diagnosis present

## 2023-10-08 DIAGNOSIS — J449 Chronic obstructive pulmonary disease, unspecified: Secondary | ICD-10-CM | POA: Diagnosis not present

## 2023-10-08 DIAGNOSIS — N189 Chronic kidney disease, unspecified: Secondary | ICD-10-CM | POA: Insufficient documentation

## 2023-10-08 DIAGNOSIS — Z7982 Long term (current) use of aspirin: Secondary | ICD-10-CM | POA: Insufficient documentation

## 2023-10-08 LAB — CBC WITH DIFFERENTIAL/PLATELET
Abs Immature Granulocytes: 0.01 10*3/uL (ref 0.00–0.07)
Basophils Absolute: 0 10*3/uL (ref 0.0–0.1)
Basophils Relative: 1 %
Eosinophils Absolute: 0.5 10*3/uL (ref 0.0–0.5)
Eosinophils Relative: 8 %
HCT: 33.1 % — ABNORMAL LOW (ref 39.0–52.0)
Hemoglobin: 10 g/dL — ABNORMAL LOW (ref 13.0–17.0)
Immature Granulocytes: 0 %
Lymphocytes Relative: 31 %
Lymphs Abs: 1.8 10*3/uL (ref 0.7–4.0)
MCH: 29.9 pg (ref 26.0–34.0)
MCHC: 30.2 g/dL (ref 30.0–36.0)
MCV: 99.1 fL (ref 80.0–100.0)
Monocytes Absolute: 0.8 10*3/uL (ref 0.1–1.0)
Monocytes Relative: 14 %
Neutro Abs: 2.7 10*3/uL (ref 1.7–7.7)
Neutrophils Relative %: 46 %
Platelets: 196 10*3/uL (ref 150–400)
RBC: 3.34 MIL/uL — ABNORMAL LOW (ref 4.22–5.81)
RDW: 12.9 % (ref 11.5–15.5)
WBC: 5.7 10*3/uL (ref 4.0–10.5)
nRBC: 0 % (ref 0.0–0.2)

## 2023-10-08 LAB — RESP PANEL BY RT-PCR (RSV, FLU A&B, COVID)  RVPGX2
Influenza A by PCR: NEGATIVE
Influenza B by PCR: NEGATIVE
Resp Syncytial Virus by PCR: NEGATIVE
SARS Coronavirus 2 by RT PCR: NEGATIVE

## 2023-10-08 LAB — BASIC METABOLIC PANEL WITH GFR
Anion gap: 8 (ref 5–15)
BUN: 11 mg/dL (ref 8–23)
CO2: 38 mmol/L — ABNORMAL HIGH (ref 22–32)
Calcium: 9.1 mg/dL (ref 8.9–10.3)
Chloride: 87 mmol/L — ABNORMAL LOW (ref 98–111)
Creatinine, Ser: 0.59 mg/dL — ABNORMAL LOW (ref 0.61–1.24)
GFR, Estimated: >60 mL/min (ref >60)
Glucose, Bld: 108 mg/dL — ABNORMAL HIGH (ref 70–99)
Potassium: 4.5 mmol/L (ref 3.5–5.1)
Sodium: 133 mmol/L — ABNORMAL LOW (ref 135–145)

## 2023-10-08 LAB — TROPONIN I (HIGH SENSITIVITY): Troponin I (High Sensitivity): 5 ng/L (ref <18)

## 2023-10-08 MED ORDER — IPRATROPIUM-ALBUTEROL 0.5-2.5 (3) MG/3ML IN SOLN
3.0000 mL | Freq: Once | RESPIRATORY_TRACT | Status: AC
  Administered 2023-10-08: 3 mL via RESPIRATORY_TRACT
  Filled 2023-10-08: qty 3

## 2023-10-08 NOTE — ED Triage Notes (Signed)
 Pt here from [facility] for shortness of breath. Pt with a hx of COPD. Normally wears 2-3L O2 as needed. Has been short of breath all day.  5mg  albuterol  0.5mg  atrovent   124/70 80s 100% 3L [place] 125mg  solumedrol  22G left hand

## 2023-10-09 LAB — TROPONIN I (HIGH SENSITIVITY): Troponin I (High Sensitivity): 4 ng/L (ref <18)

## 2023-10-09 MED ORDER — MORPHINE SULFATE (PF) 2 MG/ML IV SOLN: 2.0000 mg | Freq: Once | INTRAVENOUS | Status: DC

## 2023-10-09 MED ORDER — IPRATROPIUM BROMIDE 0.02 % IN SOLN
0.5000 mg | Freq: Once | RESPIRATORY_TRACT | Status: AC
  Administered 2023-10-09: 0.5 mg via RESPIRATORY_TRACT
  Filled 2023-10-09: qty 2.5

## 2023-10-09 MED ORDER — ALBUTEROL SULFATE HFA 108 (90 BASE) MCG/ACT IN AERS
2.0000 | INHALATION_SPRAY | Freq: Once | RESPIRATORY_TRACT | Status: AC
  Administered 2023-10-09: 2 via RESPIRATORY_TRACT
  Filled 2023-10-09: qty 6.7

## 2023-10-09 MED ORDER — ALBUTEROL SULFATE (2.5 MG/3ML) 0.083% IN NEBU
5.0000 mg | INHALATION_SOLUTION | Freq: Once | RESPIRATORY_TRACT | Status: AC
  Administered 2023-10-09: 5 mg via RESPIRATORY_TRACT
  Filled 2023-10-09: qty 6

## 2023-10-09 NOTE — Discharge Instructions (Signed)
 Can continue using nebs as needed.   Use barrier cream

## 2023-10-09 NOTE — ED Provider Notes (Signed)
 [place] EMERGENCY DEPARTMENT AT [facility] Provider Note   CSN: [id number] Arrival date & time: 10/08/23  2223     History  Chief Complaint  Patient presents with   Shortness of Breath    Jaime Cline is a 63 y.o. male.  The history is provided by the patient and medical records.  Shortness of Breath  63 year old male with history of COPD on chronic oxygen, depression, substance abuse, chronic kidney disease, presenting to the ED with shortness of breath.  Patient is a resident at [facility].  Was trying to get staff to help him all day today but was unsuccessful.  He ultimately called 911 on his own.  He denies any significant cough or fever.  No reported sick contact or outbreak at facility.  He is usually on 2 to 3 L oxygen at baseline.  He was given Solu-Medrol , albuterol , and Atrovent  with EMS with some improvement.  Also reports a small sore on his buttock area.  Just feels irritated.  Still having BM's without issue.  Home Medications Prior to Admission medications   Medication Sig Start Date End Date Taking? Authorizing Provider  acetaminophen  (TYLENOL ) 500 MG tablet Take 500 mg by mouth in the morning, at noon, and at bedtime.   Yes [provider]  albuterol  (VENTOLIN  HFA) 108 (90 Base) MCG/ACT inhaler Inhale 1-2 puffs into the lungs every 6 (six) hours as needed for wheezing. 06/29/23  Yes Kraig Peru, MD  aspirin  EC 81 MG tablet Take 1 tablet (81 mg total) by mouth daily. Swallow whole. 06/29/23 06/28/24 Yes Kraig Peru, MD  Baclofen 5 MG TABS Take 1 tablet by mouth at bedtime. can be given every 12 hours as needed for Muscle spasms   Yes [provider]  barrier cream (NON-SPECIFIED) CREA Apply 1 Application topically See admin instructions. 1 application to peri area every shift with all incontinence care   Yes [provider]  Camphor-Menthol  (ARCTIC RELIEF PAIN RELIEVING) 0.2-3.5 % GEL Apply 1 Application  topically in the morning and at bedtime.   Yes [provider]  folic acid  (FOLVITE ) 1 MG tablet Take 1 tablet (1 mg total) by mouth daily. 06/29/23  Yes Kraig Peru, MD  gabapentin  (NEURONTIN ) 300 MG capsule Take 2 capsules (600 mg total) by mouth 3 (three) times daily. 06/29/23  Yes Kraig Peru, MD  ipratropium-albuterol  (DUONEB) 0.5-2.5 (3) MG/3ML SOLN Take 3 ml by nebulizer once daily for 5 days starting 06/26/23, then 3 ml by nebulizer twice daily Patient taking differently: Take 3 mLs by nebulization 4 (four) times daily. 06/29/23  Yes Kraig Peru, MD  loperamide  (IMODIUM  A-D) 2 MG tablet Take 2 mg by mouth every 6 (six) hours as needed for diarrhea or loose stools.   Yes [provider]  LORazepam  (ATIVAN ) 1 MG tablet Take 1 mg by mouth every 4 (four) hours as needed for anxiety.   Yes [provider]  morphine  (ROXANOL) 20 MG/ML concentrated solution Take 0.25 mLs (5 mg total) by mouth every 8 (eight) hours as needed for shortness of breath or anxiety (pain). 06/29/23  Yes Kraig Peru, MD  OXYGEN Inhale 2 L into the lungs continuous.   Yes [provider]  sertraline  (ZOLOFT ) 25 MG tablet Take 25 mg by mouth daily.   Yes [provider]  traZODone  (DESYREL ) 50 MG tablet Take 1 tablet (50 mg total) by mouth at bedtime as needed for sleep. Patient  taking differently: Take 75 mg by mouth at bedtime. 10/17/22 10/09/23 Yes Ghimire, Letty Raya, MD  nicotine  (NICODERM CQ  - DOSED IN MG/24 HOURS) 14 mg/24hr patch Place 1 patch (14 mg total) onto the skin daily. Patient not taking: Reported on 10/09/2023 06/29/23   Kraig Peru, MD  albuterol  (PROVENTIL ,VENTOLIN ) 90 MCG/ACT inhaler Inhale 2 puffs into the lungs every 6 (six) hours as needed. Shortness of breath and wheezing   03/22/12  [provider]      Allergies    Other    Review of Systems   Review of Systems  Respiratory:  Positive for shortness of breath.   All other systems  reviewed and are negative.   Physical Exam Updated Vital Signs BP (!) 130/94 (BP Location: Left Arm)   Pulse 92   Temp 98.7 F (37.1 C) (Oral)   Resp 15   Ht 5\' 10"  (1.778 m)   Wt 40.4 kg   SpO2 100%   BMI 12.77 kg/m   Physical Exam Vitals and nursing note reviewed.  Constitutional:      Appearance: He is well-developed.     Comments: Very thin in appearance  HENT:     Head: Normocephalic and atraumatic.  Eyes:     Conjunctiva/sclera: Conjunctivae normal.     Pupils: Pupils are equal, round, and reactive to light.  Cardiovascular:     Rate and Rhythm: Normal rate and regular rhythm.     Heart sounds: Normal heart sounds.  Pulmonary:     Effort: Pulmonary effort is normal.     Breath sounds: Wheezing present.     Comments: On baseline 3L, mild expiratory wheezes throughout, able to speak in sentences without difficulty Abdominal:     General: Bowel sounds are normal.     Palpations: Abdomen is soft.  Musculoskeletal:        General: Normal range of motion.     Cervical back: Normal range of motion.     Comments: Stage 1 appearing pressure sore to sacral area, there is no surrounding erythema or induration, no drainage  Skin:    General: Skin is warm and dry.  Neurological:     Mental Status: He is alert and oriented to person, place, and time.     ED Results / Procedures / Treatments   Labs (all labs ordered are listed, but only abnormal results are displayed) Labs Reviewed  CBC WITH DIFFERENTIAL/PLATELET - Abnormal; Notable for the following components:      Result Value   RBC 3.34 (*)    Hemoglobin 10.0 (*)    HCT 33.1 (*)    All other components within normal limits  BASIC METABOLIC PANEL WITH GFR - Abnormal; Notable for the following components:   Sodium 133 (*)    Chloride 87 (*)    CO2 38 (*)    Glucose, Bld 108 (*)    Creatinine, Ser 0.59 (*)    All other components within normal limits  RESP PANEL BY RT-PCR (RSV, FLU A&B, COVID)  [id number]   TROPONIN I (HIGH SENSITIVITY)  TROPONIN I (HIGH SENSITIVITY)    EKG None  Radiology DG Chest Port 1 View Result Date: 10/09/2023 CLINICAL DATA:  COPD, shortness of breath. EXAM: PORTABLE CHEST 1 VIEW COMPARISON:  June 26, 2023 FINDINGS: The heart size and mediastinal contours are within normal limits. The lungs are hyperinflated. There is no evidence of an acute infiltrate, pleural effusion or pneumothorax. The visualized skeletal structures are unremarkable. IMPRESSION: COPD without acute cardiopulmonary  disease. Electronically Signed   By: Virgle Grime M.D.   On: 10/09/2023 04:12    Procedures Procedures    CRITICAL CARE Performed by: Coretha Dew   Total critical care time: 35 minutes  Critical care time was exclusive of separately billable procedures and treating other patients.  Critical care was necessary to treat or prevent imminent or life-threatening deterioration.  Critical care was time spent personally by me on the following activities: development of treatment plan with patient and/or surrogate as well as nursing, discussions with consultants, evaluation of patient's response to treatment, examination of patient, obtaining history from patient or surrogate, ordering and performing treatments and interventions, ordering and review of laboratory studies, ordering and review of radiographic studies, pulse oximetry and re-evaluation of patient's condition.   Medications Ordered in ED Medications  ipratropium-albuterol  (DUONEB) 0.5-2.5 (3) MG/3ML nebulizer solution 3 mL (3 mLs Nebulization Given 10/08/23 2257)  albuterol  (PROVENTIL ) (2.5 MG/3ML) 0.083% nebulizer solution 5 mg (5 mg Nebulization Given 10/09/23 0145)  ipratropium (ATROVENT ) nebulizer solution 0.5 mg (0.5 mg Nebulization Given 10/09/23 0143)    ED Course/ Medical Decision Making/ A&P                                 Medical Decision Making Amount and/or Complexity of Data Reviewed Labs:  ordered. Radiology: ordered and independent interpretation performed. ECG/medicine tests: ordered and independent interpretation performed.  Risk Prescription drug management.   63 y.o. M here with SOB.  Hx of COPD, on chronic 3L.  Given solumedrol and nebs with EMS with some improvement.    Afebrile, non-toxic in appearance.  Currently still on baseline O2, not really distressed in appearance but does have some wheezing on exam.  Also has pressure wound to sacral area.  Appears stage 1 without signs of superimposed infection.  Already has barrier cream prescribed.  Will check labs, RVP, CXR.  Given additional nebs.  Labs as above-- no leukocytosis.  Very minor electrolyte derangements, similar to prior.  Trop negative x2.  CXR with COPD but no acute infiltrate.  RVP negative.    Has improved after nebs x2.  No longer wheezing.  Remains on his baseline 3L O2 with stable vitals, comfortable appearing.  Feel he is stable for discharge back to facility.  He will follow-up with PCP.  Return here for new concerns.  Final Clinical Impression(s) / ED Diagnoses Final diagnoses:  Shortness of breath  Chronic obstructive pulmonary disease, unspecified COPD type [facility])    Rx / DC Orders ED Discharge Orders     None         Coretha Dew, PA-C 10/09/23 0601    Sallyanne Creamer, DO 10/12/23 2126

## 2023-10-09 NOTE — ED Notes (Signed)
 Report given to [facility], LPN [employer]

## 2023-10-23 ENCOUNTER — Encounter (HOSPITAL_COMMUNITY): Payer: Self-pay | Admitting: Emergency Medicine

## 2023-10-23 ENCOUNTER — Emergency Department (HOSPITAL_COMMUNITY)

## 2023-10-23 ENCOUNTER — Other Ambulatory Visit: Payer: Self-pay

## 2023-10-23 ENCOUNTER — Emergency Department (HOSPITAL_COMMUNITY)
Admission: EM | Admit: 2023-10-23 | Discharge: 2023-10-24 | Disposition: A | Discharge status: Home / Self Care | Attending: Emergency Medicine | Admitting: Emergency Medicine

## 2023-10-23 DIAGNOSIS — J449 Chronic obstructive pulmonary disease, unspecified: Secondary | ICD-10-CM | POA: Insufficient documentation

## 2023-10-23 DIAGNOSIS — R0789 Other chest pain: Secondary | ICD-10-CM | POA: Insufficient documentation

## 2023-10-23 DIAGNOSIS — M79661 Pain in right lower leg: Secondary | ICD-10-CM | POA: Insufficient documentation

## 2023-10-23 DIAGNOSIS — Z7982 Long term (current) use of aspirin: Secondary | ICD-10-CM | POA: Diagnosis not present

## 2023-10-23 DIAGNOSIS — I509 Heart failure, unspecified: Secondary | ICD-10-CM | POA: Diagnosis not present

## 2023-10-23 DIAGNOSIS — M79662 Pain in left lower leg: Secondary | ICD-10-CM | POA: Insufficient documentation

## 2023-10-23 DIAGNOSIS — M549 Dorsalgia, unspecified: Secondary | ICD-10-CM | POA: Insufficient documentation

## 2023-10-23 LAB — TROPONIN I (HIGH SENSITIVITY)
Troponin I (High Sensitivity): 4 ng/L (ref <18)
Troponin I (High Sensitivity): 6 ng/L (ref <18)

## 2023-10-23 MED ORDER — ACETAMINOPHEN 325 MG PO TABS
650.0000 mg | ORAL_TABLET | Freq: Once | ORAL | Status: AC
  Administered 2023-10-23: 650 mg via ORAL
  Filled 2023-10-23: qty 2

## 2023-10-23 MED ORDER — KETOROLAC TROMETHAMINE 30 MG/ML IJ SOLN
15.0000 mg | Freq: Once | INTRAMUSCULAR | Status: AC
  Administered 2023-10-23: 15 mg via INTRAVENOUS
  Filled 2023-10-23: qty 1

## 2023-10-23 MED ORDER — ALBUTEROL SULFATE HFA 108 (90 BASE) MCG/ACT IN AERS
1.0000 | INHALATION_SPRAY | Freq: Once | RESPIRATORY_TRACT | Status: AC
  Administered 2023-10-23: 1 via RESPIRATORY_TRACT
  Filled 2023-10-23: qty 6.7

## 2023-10-23 MED ORDER — PANTOPRAZOLE SODIUM 40 MG IV SOLR
40.0000 mg | Freq: Once | INTRAVENOUS | Status: AC
  Administered 2023-10-23: 40 mg via INTRAVENOUS
  Filled 2023-10-23: qty 10

## 2023-10-23 NOTE — ED Notes (Signed)
 Pt. Awaiting PTAR

## 2023-10-23 NOTE — Progress Notes (Signed)
 [facility] ED [facility] liaison note     This patient is a current hospice patient with [facility]. Please see media tab for detailed hospice report.    Liaison will continue to follow for any discharge planning needs and to coordinate continuation of hospice care.    Please don't hesitate to call with any Hospice related questions or concerns.    Thank you for the opportunity to participate in this patient's care.  Madelene Schanz, BSN, RN, OCN [employer] [phone]

## 2023-10-23 NOTE — Discharge Instructions (Addendum)
 Your workup today is reassuring. It is very unlikely that your pain is due to an issue in your heart.  Your cardiac enzyme (troponin) was normal today. Your EKG which is a measure of the heart's electrical activity and rhythm is normal today. These would both show abnormalities if you were having a heart attack.  Your chest x-ray shows your COPD, but no other abnormalities.  You were provided an inhaler today to use as needed for shortness of breath.  Please get further refills from your primary care provider.  Please keep the wound on your sacrum clean with soap and water.  Please apply dressings to the area shown here.  Please keep off the area is much as possible to help with wound healing.  Return to the ER if you have any shortness of breath, difficulty breathing, worsening chest pain, dizziness, jaw pain, left arm or shoulder pain, abdominal pain, unexplained fever, any other new or concerning symptoms.

## 2023-10-23 NOTE — ED Provider Notes (Signed)
  Physical Exam  BP (!) 129/96 (BP Location: Right Arm)   Pulse 75   Temp 98.5 F (36.9 C) (Oral)   Resp 15   Ht 5\' 10"  (1.778 m)   Wt 40.4 kg   SpO2 100%   BMI 12.77 kg/m   Physical Exam Vitals and nursing note reviewed.  Constitutional:      Appearance: Normal appearance.  HENT:     Head: Atraumatic.  Cardiovascular:     Rate and Rhythm: Normal rate and regular rhythm.  Pulmonary:     Effort: Pulmonary effort is normal.     Breath sounds: Normal breath sounds.  Skin:    Comments: Very superficial sacral ulcer approximately 1 cm in diameter.  No surrounding erythema or edema, no pus drainage.  No bone visualized  Neurological:     General: No focal deficit present.     Mental Status: He is alert.  Psychiatric:        Mood and Affect: Mood normal.        Behavior: Behavior normal.       Procedures  Procedures  ED Course / MDM    Medical Decision Making Amount and/or Complexity of Data Reviewed Radiology: ordered.  Risk OTC drugs. Prescription drug management.   Patient received at signout from previous team.  In short, patient presenting with concern for left-sided chest pain that has been ongoing for the past 2 days.  Pain nonexertional, nonpleuritic.  He also reports pain that goes down to the left arm, but states this has been an ongoing problem for the past 4 years.  Denies any injuries to the arm.   Low concern for ACS at this time given troponin remains stable with initial troponin of 4 and repeat of 6, pain non-exertional, and EKG with normal sinus rhythm and no ST changes. Chest x-ray without any acute abnormality, baseline COPD.  He is on his baseline oxygen. low concern for PE at this time given no shortness of breath, stable vitals.    When he went back to patient, he also shows me a very superficial sacral ulcer.  No surrounding erythema or edema, no pus drainage.  Not able to visualize any bone.  Please see image above.  I had nurse put a dressing  over it prior to discharge.  Also ordered an albuterol  inhaler for him given his COPD, and he states he is run out of this at home.    Vital signs stable, low concern for emergent pathology at this time.  Appropriate for discharge home this time.  Return precautions given.       Rexie Catena, PA-C 10/23/23 1840    Arvilla Birmingham, MD 10/23/23 [phone]

## 2023-10-23 NOTE — ED Triage Notes (Signed)
 Patient presents from [facility] due to chest pain for 2 days. Pain is sharp and worse with inspiration and palpation on the left. Pain is also felt on the back behind where the chest pain is felt. Rales heard by EMS in the lower left lobe. Aspirin  given by EMS.   EMS vitals: 80 P 130/78 BP 99% SPO2 on 4 L O 2 (chronic) 145 CBG 100.1 temp

## 2023-10-23 NOTE — ED Provider Notes (Signed)
 [place] EMERGENCY DEPARTMENT AT [facility] Provider Note   CSN: [id number] Arrival date & time: 10/23/23  1308     History  Chief Complaint  Patient presents with   Chest Pain   Back Pain    Jaime Cline is a 63 y.o. male who presents to the ED via EMS with a chief complaint of chest pain.  Patient states he has had constant chest pain for the past 2 days.  Patient describes the pain as chest tightness that radiates down his left arm. Patient has a significant past medical history of COPD and heart failure.  Patient denies fever, chills, or weakness different from baseline.  Patient states that he was short of breath earlier, but is no longer short of breath at time of exam.  Patient states he is also having some left-sided back pain. Denies trauma/injury, denies known wound or rash. Denies cough. Patient lives at a skilled nursing facility and is a hospice patient. Patient does have history of elevated troponins and possible NSTEMI.    Chest Pain Associated symptoms: back pain   Associated symptoms: no abdominal pain, no cough, no fever, no nausea, no shortness of breath, no vomiting and no weakness   Back Pain Associated symptoms: chest pain   Associated symptoms: no abdominal pain, no fever and no weakness        Home Medications Prior to Admission medications   Medication Sig Start Date End Date Taking? Authorizing Provider  acetaminophen  (TYLENOL ) 500 MG tablet Take 500 mg by mouth in the morning, at noon, and at bedtime.    [provider]  albuterol  (VENTOLIN  HFA) 108 (90 Base) MCG/ACT inhaler Inhale 1-2 puffs into the lungs every 6 (six) hours as needed for wheezing. 06/29/23   Kraig Peru, MD  aspirin  EC 81 MG tablet Take 1 tablet (81 mg total) by mouth daily. Swallow whole. 06/29/23 06/28/24  Patel, Pranav M, MD  Baclofen 5 MG TABS Take 1 tablet by mouth at bedtime. can be given every 12 hours as needed for Muscle spasms    [provider]   barrier cream (NON-SPECIFIED) CREA Apply 1 Application topically See admin instructions. 1 application to peri area every shift with all incontinence care    [provider]  Camphor-Menthol  (ARCTIC RELIEF PAIN RELIEVING) 0.2-3.5 % GEL Apply 1 Application topically in the morning and at bedtime.    [provider]  folic acid  (FOLVITE ) 1 MG tablet Take 1 tablet (1 mg total) by mouth daily. 06/29/23   Kraig Peru, MD  gabapentin  (NEURONTIN ) 300 MG capsule Take 2 capsules (600 mg total) by mouth 3 (three) times daily. 06/29/23   Kraig Peru, MD  ipratropium-albuterol  (DUONEB) 0.5-2.5 (3) MG/3ML SOLN Take 3 ml by nebulizer once daily for 5 days starting 06/26/23, then 3 ml by nebulizer twice daily Patient taking differently: Take 3 mLs by nebulization 4 (four) times daily. 06/29/23   Kraig Peru, MD  loperamide  (IMODIUM  A-D) 2 MG tablet Take 2 mg by mouth every 6 (six) hours as needed for diarrhea or loose stools.    [provider]  LORazepam  (ATIVAN ) 1 MG tablet Take 1 mg by mouth every 4 (four) hours as needed for anxiety.    [provider]  morphine  (ROXANOL) 20 MG/ML concentrated solution Take 0.25 mLs (5 mg total) by mouth every 8 (eight) hours as needed for shortness of breath or anxiety (pain). 06/29/23   Kraig Peru, MD  nicotine  (NICODERM CQ  - DOSED IN MG/24 HOURS) 14 mg/24hr patch Place 1 patch (14 mg total) onto the skin daily. Patient not taking: Reported on 10/09/2023 06/29/23   Kraig Peru, MD  OXYGEN Inhale 2 L into the lungs continuous.    [provider]  sertraline  (ZOLOFT ) 25 MG tablet Take 25 mg by mouth daily.    [provider]  traZODone  (DESYREL ) 50 MG tablet Take 1 tablet (50 mg total) by mouth at bedtime as needed for sleep. Patient taking differently: Take 75 mg by mouth at bedtime. 10/17/22 10/09/23  Vada Garibaldi, MD  albuterol  (PROVENTIL ,VENTOLIN ) 90 MCG/ACT inhaler Inhale 2 puffs into the lungs every 6  (six) hours as needed. Shortness of breath and wheezing   03/22/12  [provider]      Allergies    Other    Review of Systems   Review of Systems  Constitutional:  Negative for chills and fever.  Respiratory:  Positive for wheezing. Negative for cough and shortness of breath.   Cardiovascular:  Positive for chest pain. Negative for leg swelling.  Gastrointestinal:  Negative for abdominal pain, nausea and vomiting.  Musculoskeletal:  Positive for back pain.  Skin:  Negative for rash and wound.  Neurological:  Negative for syncope, facial asymmetry and weakness.    Physical Exam Updated Vital Signs BP 139/81 (BP Location: Right Arm)   Pulse 71   Temp 98.1 F (36.7 C) (Oral)   Resp 18   Ht 5\' 10"  (1.778 m)   Wt 40.4 kg   SpO2 100% Comment: via nasal cannula  BMI 12.77 kg/m  Physical Exam Vitals and nursing note reviewed.  Constitutional:      General: He is awake. He is not in acute distress.    Appearance: He is not ill-appearing, toxic-appearing or diaphoretic.     Comments: Mild bilateral calf tenderness present on exam with palpation, no swelling or redness.   HENT:     Head: Normocephalic and atraumatic.  Cardiovascular:     Rate and Rhythm: Normal rate and regular rhythm.     Pulses:          Radial pulses are 2+ on the right side and 2+ on the left side.     Heart sounds: Normal heart sounds.  Pulmonary:     Effort: Pulmonary effort is normal.     Breath sounds: Wheezing present.  Chest:     Chest wall: Tenderness (Left-sided chest tenderness with palpation) present.  Abdominal:     Palpations: Abdomen is soft.  Musculoskeletal:     Cervical back: Normal range of motion.  Skin:    General: Skin is warm and dry.     Capillary Refill: Capillary refill takes less than 2 seconds.     Findings: No rash.  Neurological:     General: No focal deficit present.     Mental Status: He is alert and oriented to person, place, and time.     Motor: No  weakness.  Psychiatric:        Behavior: Behavior is cooperative.     ED Results / Procedures / Treatments   Labs (all labs ordered are listed, but only abnormal results are displayed) Labs Reviewed  TROPONIN I (HIGH SENSITIVITY)    EKG None  Radiology No results found.  Procedures Procedures    Medications Ordered in ED Medications - No data to display  ED Course/ Medical Decision Making/ A&P    Patient presents to the ED  for concern of chest pain, this involves an extensive number of treatment options, and is a complaint that carries with it a high risk of complications and morbidity.  The differential diagnosis includes The emergent differential diagnosis of chest pain includes: Acute coronary syndrome, pericarditis, aortic dissection, pulmonary embolism, tension pneumothorax, pneumonia, and esophageal rupture. Patient also reporting some left-sided back pain over ribs. Denies open wound or rash, denies trauma.   Wheezing on exam possibly consistent with history of heart failure and COPD.    Co morbidities that complicate the patient evaluation  Hospice patient    Lab Tests:  I Ordered, and personally interpreted labs.  The pertinent results include:  troponin - pending    Imaging Studies ordered:  I ordered imaging studies including CXR I independently visualized and interpreted imaging which showed no active cardiopulmonary disease, evidence of COPD I agree with the radiologist interpretation   Cardiac Monitoring:  The patient was maintained on a cardiac monitor.  I personally viewed and interpreted the cardiac monitored which showed an underlying rhythm of: sinus EKG ordered, EKG in room by EMS showed no evidence of STEMI   Medicines ordered and prescription drug management:  I have reviewed the patients home medicines and have made adjustments as needed   Test Considered:  D-dimer, DVT ultrasound studies: patient denies hemoptysis, denies  shortness of breath different from baseline at time of exam. Bilateral calf tenderness but no swelling or redness present.   Problem List / ED Course:  Chest pain, back pain   Reevaluation:  After the interventions noted above, I reevaluated the patient and found that they have :stayed the same   Social Determinants of Health:  Hospice patient   Dispostion:  Patient signed out to [facility], currently waiting on troponins. Chest xray significant for heart failure and COPD, no acute cardiopulmonary disease.   Click here for ABCD2, HEART and other calculatorsREFRESH Note before signing :1}                              Medical Decision Making Amount and/or Complexity of Data Reviewed Radiology: ordered.          Final Clinical Impression(s) / ED Diagnoses Final diagnoses:  None    Rx / DC Orders ED Discharge Orders     None         Fonda Hymen, PA-C 10/23/23 1530    Deatra Face, MD 10/23/23 1552

## 2023-10-23 NOTE — ED Notes (Signed)
 Report given to nurse @ [facility]

## 2023-11-05 ENCOUNTER — Emergency Department (HOSPITAL_COMMUNITY)

## 2023-11-05 ENCOUNTER — Other Ambulatory Visit: Payer: Self-pay

## 2023-11-05 ENCOUNTER — Emergency Department (HOSPITAL_COMMUNITY)
Admission: EM | Admit: 2023-11-05 | Discharge: 2023-11-05 | Disposition: A | Discharge status: Skilled Nursing Facility | Attending: Emergency Medicine | Admitting: Emergency Medicine

## 2023-11-05 DIAGNOSIS — Z7951 Long term (current) use of inhaled steroids: Secondary | ICD-10-CM | POA: Diagnosis not present

## 2023-11-05 DIAGNOSIS — Z7982 Long term (current) use of aspirin: Secondary | ICD-10-CM | POA: Insufficient documentation

## 2023-11-05 DIAGNOSIS — J449 Chronic obstructive pulmonary disease, unspecified: Secondary | ICD-10-CM | POA: Insufficient documentation

## 2023-11-05 DIAGNOSIS — Z515 Encounter for palliative care: Secondary | ICD-10-CM

## 2023-11-05 DIAGNOSIS — E871 Hypo-osmolality and hyponatremia: Secondary | ICD-10-CM | POA: Diagnosis not present

## 2023-11-05 DIAGNOSIS — I10 Essential (primary) hypertension: Secondary | ICD-10-CM | POA: Insufficient documentation

## 2023-11-05 DIAGNOSIS — R0602 Shortness of breath: Secondary | ICD-10-CM | POA: Diagnosis present

## 2023-11-05 DIAGNOSIS — J45909 Unspecified asthma, uncomplicated: Secondary | ICD-10-CM | POA: Diagnosis not present

## 2023-11-05 DIAGNOSIS — R0600 Dyspnea, unspecified: Secondary | ICD-10-CM

## 2023-11-05 LAB — BASIC METABOLIC PANEL WITH GFR
Anion gap: 10 (ref 5–15)
BUN: 11 mg/dL (ref 8–23)
CO2: 35 mmol/L — ABNORMAL HIGH (ref 22–32)
Calcium: 9.1 mg/dL (ref 8.9–10.3)
Chloride: 84 mmol/L — ABNORMAL LOW (ref 98–111)
Creatinine, Ser: 0.6 mg/dL — ABNORMAL LOW (ref 0.61–1.24)
GFR, Estimated: >60 mL/min (ref >60)
Glucose, Bld: 98 mg/dL (ref 70–99)
Potassium: 3.9 mmol/L (ref 3.5–5.1)
Sodium: 129 mmol/L — ABNORMAL LOW (ref 135–145)

## 2023-11-05 LAB — CBC
HCT: 34.5 % — ABNORMAL LOW (ref 39.0–52.0)
Hemoglobin: 10.5 g/dL — ABNORMAL LOW (ref 13.0–17.0)
MCH: 29.1 pg (ref 26.0–34.0)
MCHC: 30.4 g/dL (ref 30.0–36.0)
MCV: 95.6 fL (ref 80.0–100.0)
Platelets: 335 10*3/uL (ref 150–400)
RBC: 3.61 MIL/uL — ABNORMAL LOW (ref 4.22–5.81)
RDW: 13.3 % (ref 11.5–15.5)
WBC: 4.7 10*3/uL (ref 4.0–10.5)
nRBC: 0 % (ref 0.0–0.2)

## 2023-11-05 MED ORDER — IPRATROPIUM-ALBUTEROL 0.5-2.5 (3) MG/3ML IN SOLN
3.0000 mL | Freq: Once | RESPIRATORY_TRACT | Status: AC
  Administered 2023-11-05: 3 mL via RESPIRATORY_TRACT
  Filled 2023-11-05: qty 3

## 2023-11-05 MED ORDER — SODIUM CHLORIDE 0.9 % IV BOLUS
500.0000 mL | Freq: Once | INTRAVENOUS | Status: AC
  Administered 2023-11-05: 500 mL via INTRAVENOUS

## 2023-11-05 NOTE — ED Notes (Signed)
 Attempted to call report to [facility] x3 , no answer.

## 2023-11-05 NOTE — ED Provider Notes (Signed)
 [place] EMERGENCY DEPARTMENT AT [facility] Provider Note   CSN: [id number] Arrival date & time: 11/05/23  0830     History  Chief Complaint  Patient presents with   Shortness of Breath    Jaime Cline is a 63 y.o. male.   Shortness of Breath Patient is on hospice due to his COPD.  On chronic oxygen.  Patient reportedly on 2 to 3 L normally.  States over the last few days more short of breath.  No cough.  No chest pain.  States he just hopes to breathe a little better.    Past Medical History:  Diagnosis Date   Alcohol  abuse    Asthma    Chronic back pain    Chronic knee pain    COPD (chronic obstructive pulmonary disease) (HCC)    Hypertension     Home Medications Prior to Admission medications   Medication Sig Start Date End Date Taking? Authorizing Provider  acetaminophen  (TYLENOL ) 500 MG tablet Take 500 mg by mouth in the morning, at noon, and at bedtime.    [provider]  albuterol  (VENTOLIN  HFA) 108 (90 Base) MCG/ACT inhaler Inhale 1-2 puffs into the lungs every 6 (six) hours as needed for wheezing. 06/29/23   Kraig Peru, MD  aspirin  EC 81 MG tablet Take 1 tablet (81 mg total) by mouth daily. Swallow whole. 06/29/23 06/28/24  Patel, Pranav M, MD  Baclofen 5 MG TABS Take 1 tablet by mouth at bedtime. can be given every 12 hours as needed for Muscle spasms    [provider]  barrier cream (NON-SPECIFIED) CREA Apply 1 Application topically See admin instructions. 1 application to peri area every shift with all incontinence care    [provider]  Camphor-Menthol  (ARCTIC RELIEF PAIN RELIEVING) 0.2-3.5 % GEL Apply 1 Application topically in the morning and at bedtime.    [provider]  folic acid  (FOLVITE ) 1 MG tablet Take 1 tablet (1 mg total) by mouth daily. 06/29/23   Kraig Peru, MD  gabapentin  (NEURONTIN ) 300 MG capsule Take 2 capsules (600 mg total) by mouth 3 (three) times daily. 06/29/23   Kraig Peru, MD  ipratropium-albuterol  (DUONEB) 0.5-2.5 (3) MG/3ML SOLN Take 3 ml by nebulizer once daily for 5 days starting 06/26/23, then 3 ml by nebulizer twice daily Patient taking differently: Take 3 mLs by nebulization 4 (four) times daily. 06/29/23   Kraig Peru, MD  loperamide  (IMODIUM  A-D) 2 MG tablet Take 2 mg by mouth every 6 (six) hours as needed for diarrhea or loose stools.    [provider]  LORazepam  (ATIVAN ) 1 MG tablet Take 1 mg by mouth every 4 (four) hours as needed for anxiety.    [provider]  morphine  (ROXANOL) 20 MG/ML concentrated solution Take 0.25 mLs (5 mg total) by mouth every 8 (eight) hours as needed for shortness of breath or anxiety (pain). 06/29/23   Kraig Peru, MD  nicotine  (NICODERM CQ  - DOSED IN MG/24 HOURS) 14 mg/24hr patch Place 1 patch (14 mg total) onto the skin daily. Patient not taking: Reported on 10/09/2023 06/29/23   Kraig Peru, MD  OXYGEN Inhale 2 L into the lungs continuous.    [provider]  sertraline  (ZOLOFT ) 25 MG tablet Take 25 mg by mouth daily.    [provider]  traZODone  (DESYREL ) 50 MG tablet Take 1 tablet (50 mg total) by mouth at bedtime as needed for sleep. Patient  taking differently: Take 75 mg by mouth at bedtime. 10/17/22 10/09/23  Vada Garibaldi, MD  albuterol  (PROVENTIL ,VENTOLIN ) 90 MCG/ACT inhaler Inhale 2 puffs into the lungs every 6 (six) hours as needed. Shortness of breath and wheezing   03/22/12  [provider]      Allergies    Other    Review of Systems   Review of Systems  Respiratory:  Positive for shortness of breath.     Physical Exam Updated Vital Signs BP (!) 142/89   Pulse 79   Temp 97.6 F (36.4 C) (Oral)   Resp 13   SpO2 100%  Physical Exam Vitals and nursing note reviewed.  Cardiovascular:     Rate and Rhythm: Regular rhythm.  Pulmonary:     Comments: Decreased breath sounds throughout. Chest:     Chest wall: No tenderness.  Musculoskeletal:      Right lower leg: No edema.     Left lower leg: No edema.  Neurological:     Mental Status: He is alert.     ED Results / Procedures / Treatments   Labs (all labs ordered are listed, but only abnormal results are displayed) Labs Reviewed  BASIC METABOLIC PANEL WITH GFR - Abnormal; Notable for the following components:      Result Value   Sodium 129 (*)    Chloride 84 (*)    CO2 35 (*)    Creatinine, Ser 0.60 (*)    All other components within normal limits  CBC - Abnormal; Notable for the following components:   RBC 3.61 (*)    Hemoglobin 10.5 (*)    HCT 34.5 (*)    All other components within normal limits    EKG EKG Interpretation Date/Time:  Monday Nov 05 2023 08:42:26 EDT Ventricular Rate:  77 PR Interval:  116 QRS Duration:  85 QT Interval:  377 QTC Calculation: 427 R Axis:   86  Text Interpretation: Sinus rhythm Atrial premature complex Borderline short PR interval Consider right atrial enlargement Borderline right axis deviation Confirmed by Mozell Arias [phone]) on 11/05/2023 9:24:56 AM  Radiology DG Chest Portable 1 View Result Date: 11/05/2023 CLINICAL DATA:  Shortness of breath. EXAM: PORTABLE CHEST - 1 VIEW COMPARISON:  08/23/2023 FINDINGS: Cardiomediastinal silhouette and pulmonary vasculature are within normal limits. Markedly hyperexpanded lungs which are otherwise clear. IMPRESSION: 1. No acute cardiopulmonary process. 2. COPD. Electronically Signed   By: Elester Grim M.D.   On: 11/05/2023 10:09    Procedures Procedures    Medications Ordered in ED Medications  ipratropium-albuterol  (DUONEB) 0.5-2.5 (3) MG/3ML nebulizer solution 3 mL (3 mLs Nebulization Given 11/05/23 0854)  sodium chloride  0.9 % bolus 500 mL (0 mLs Intravenous Stopped 11/05/23 1320)  ipratropium-albuterol  (DUONEB) 0.5-2.5 (3) MG/3ML nebulizer solution 3 mL (3 mLs Nebulization Given 11/05/23 1320)    ED Course/ Medical Decision Making/ A&P                                  Medical Decision Making Amount and/or Complexity of Data Reviewed Labs: ordered. Radiology: ordered.  Risk Prescription drug management.   The patient on hospice for COPD.  Now more shortness of breath.  Does not appear dyspneic.  Will get x-ray basic blood work.  Differential diagnosis does include COPD, chronic lung issues, pneumonia, pneumothorax.-   X-ray reassuring.  Has a mild hyponatremia and small fluid bolus given.  Discussed with hospice coordinator.  They will  come see patient.  Potentially could need short stay at [facility].  Patient is asking for admission to the hospital for higher level of care.  Patient has been accepted at [facility].  Will discharge to their care.        Final Clinical Impression(s) / ED Diagnoses Final diagnoses:  Dyspnea, unspecified type  Hyponatremia  Palliative care encounter    Rx / DC Orders ED Discharge Orders     None         Mozell Arias, MD 11/05/23 1502

## 2023-11-05 NOTE — ED Triage Notes (Signed)
  Pt presents to the ED via EMS from [facility] and [facility] for worsening shortness of breath x1 week. Pt also expresses accompanied chest pain due to the increased work of breathing. EMS administered 1 duoneb en route. Pt is AOX4. Patient is normally on 4L [place] per the facility. Hx of COPD.  Pre Arrival Vitals:  BP 157/90 HR 76 CBG 130 O2 99% 4L

## 2023-11-09 ENCOUNTER — Emergency Department (HOSPITAL_COMMUNITY)

## 2023-11-09 ENCOUNTER — Inpatient Hospital Stay (HOSPITAL_COMMUNITY)
Admission: EM | Admit: 2023-11-09 | Discharge: 2023-11-13 | DRG: 191 | Disposition: A | Discharge status: Skilled Nursing Facility | Source: Skilled Nursing Facility | Attending: Family Medicine | Admitting: Family Medicine

## 2023-11-09 ENCOUNTER — Other Ambulatory Visit: Payer: Self-pay

## 2023-11-09 ENCOUNTER — Encounter (HOSPITAL_COMMUNITY): Payer: Self-pay | Admitting: Emergency Medicine

## 2023-11-09 DIAGNOSIS — E871 Hypo-osmolality and hyponatremia: Secondary | ICD-10-CM | POA: Diagnosis not present

## 2023-11-09 DIAGNOSIS — Z515 Encounter for palliative care: Secondary | ICD-10-CM

## 2023-11-09 DIAGNOSIS — J9621 Acute and chronic respiratory failure with hypoxia: Principal | ICD-10-CM

## 2023-11-09 DIAGNOSIS — F419 Anxiety disorder, unspecified: Secondary | ICD-10-CM | POA: Diagnosis present

## 2023-11-09 DIAGNOSIS — I10 Essential (primary) hypertension: Secondary | ICD-10-CM | POA: Diagnosis present

## 2023-11-09 DIAGNOSIS — R1310 Dysphagia, unspecified: Secondary | ICD-10-CM | POA: Diagnosis present

## 2023-11-09 DIAGNOSIS — Z87891 Personal history of nicotine dependence: Secondary | ICD-10-CM

## 2023-11-09 DIAGNOSIS — J441 Chronic obstructive pulmonary disease with (acute) exacerbation: Secondary | ICD-10-CM | POA: Diagnosis not present

## 2023-11-09 DIAGNOSIS — J9611 Chronic respiratory failure with hypoxia: Secondary | ICD-10-CM | POA: Diagnosis not present

## 2023-11-09 DIAGNOSIS — Z888 Allergy status to other drugs, medicaments and biological substances status: Secondary | ICD-10-CM

## 2023-11-09 DIAGNOSIS — Z66 Do not resuscitate: Secondary | ICD-10-CM | POA: Diagnosis present

## 2023-11-09 DIAGNOSIS — R64 Cachexia: Secondary | ICD-10-CM | POA: Diagnosis present

## 2023-11-09 DIAGNOSIS — Z79899 Other long term (current) drug therapy: Secondary | ICD-10-CM

## 2023-11-09 DIAGNOSIS — Z681 Body mass index (BMI) 19 or less, adult: Secondary | ICD-10-CM

## 2023-11-09 DIAGNOSIS — Z809 Family history of malignant neoplasm, unspecified: Secondary | ICD-10-CM

## 2023-11-09 DIAGNOSIS — Z7982 Long term (current) use of aspirin: Secondary | ICD-10-CM

## 2023-11-09 LAB — CBC WITH DIFFERENTIAL/PLATELET
Abs Immature Granulocytes: 0.03 10*3/uL (ref 0.00–0.07)
Basophils Absolute: 0 10*3/uL (ref 0.0–0.1)
Basophils Relative: 0 %
Eosinophils Absolute: 0.2 10*3/uL (ref 0.0–0.5)
Eosinophils Relative: 2 %
HCT: 31.9 % — ABNORMAL LOW (ref 39.0–52.0)
Hemoglobin: 10 g/dL — ABNORMAL LOW (ref 13.0–17.0)
Immature Granulocytes: 0 %
Lymphocytes Relative: 9 %
Lymphs Abs: 0.9 10*3/uL (ref 0.7–4.0)
MCH: 30.1 pg (ref 26.0–34.0)
MCHC: 31.3 g/dL (ref 30.0–36.0)
MCV: 96.1 fL (ref 80.0–100.0)
Monocytes Absolute: 0.5 10*3/uL (ref 0.1–1.0)
Monocytes Relative: 5 %
Neutro Abs: 7.9 10*3/uL — ABNORMAL HIGH (ref 1.7–7.7)
Neutrophils Relative %: 84 %
Platelets: 313 10*3/uL (ref 150–400)
RBC: 3.32 MIL/uL — ABNORMAL LOW (ref 4.22–5.81)
RDW: 13.6 % (ref 11.5–15.5)
WBC: 9.5 10*3/uL (ref 4.0–10.5)
nRBC: 0 % (ref 0.0–0.2)

## 2023-11-09 LAB — BRAIN NATRIURETIC PEPTIDE: B Natriuretic Peptide: 215.4 pg/mL — ABNORMAL HIGH (ref 0.0–100.0)

## 2023-11-09 LAB — COMPREHENSIVE METABOLIC PANEL WITH GFR
ALT: 10 U/L (ref 0–44)
AST: 18 U/L (ref 15–41)
Albumin: 4 g/dL (ref 3.5–5.0)
Alkaline Phosphatase: 53 U/L (ref 38–126)
Anion gap: 14 (ref 5–15)
BUN: 12 mg/dL (ref 8–23)
CO2: 31 mmol/L (ref 22–32)
Calcium: 9.2 mg/dL (ref 8.9–10.3)
Chloride: 83 mmol/L — ABNORMAL LOW (ref 98–111)
Creatinine, Ser: 0.55 mg/dL — ABNORMAL LOW (ref 0.61–1.24)
GFR, Estimated: >60 mL/min (ref >60)
Glucose, Bld: 98 mg/dL (ref 70–99)
Potassium: 3.8 mmol/L (ref 3.5–5.1)
Sodium: 128 mmol/L — ABNORMAL LOW (ref 135–145)
Total Bilirubin: 0.7 mg/dL (ref 0.0–1.2)
Total Protein: 7.3 g/dL (ref 6.5–8.1)

## 2023-11-09 LAB — TROPONIN I (HIGH SENSITIVITY): Troponin I (High Sensitivity): 12 ng/L (ref <18)

## 2023-11-09 MED ORDER — MORPHINE SULFATE (CONCENTRATE) 10 MG /0.5 ML PO SOLN
5.0000 mg | Freq: Three times a day (TID) | ORAL | Status: DC | PRN
  Administered 2023-11-10: 5 mg via ORAL
  Filled 2023-11-09: qty 0.5

## 2023-11-09 MED ORDER — IPRATROPIUM-ALBUTEROL 0.5-2.5 (3) MG/3ML IN SOLN
3.0000 mL | Freq: Four times a day (QID) | RESPIRATORY_TRACT | Status: DC
  Administered 2023-11-10 – 2023-11-12 (×10): 3 mL via RESPIRATORY_TRACT
  Filled 2023-11-09 (×9): qty 3

## 2023-11-09 MED ORDER — LORAZEPAM 1 MG PO TABS
1.0000 mg | ORAL_TABLET | ORAL | Status: DC | PRN
  Administered 2023-11-10 – 2023-11-13 (×10): 1 mg via ORAL
  Filled 2023-11-09 (×10): qty 1

## 2023-11-09 MED ORDER — SODIUM CHLORIDE 0.9 % IV SOLN: INTRAVENOUS | Status: AC

## 2023-11-09 MED ORDER — ENOXAPARIN SODIUM 30 MG/0.3ML IJ SOSY
30.0000 mg | PREFILLED_SYRINGE | INTRAMUSCULAR | Status: DC
  Administered 2023-11-09 – 2023-11-12 (×4): 30 mg via SUBCUTANEOUS
  Filled 2023-11-09 (×4): qty 0.3

## 2023-11-09 MED ORDER — IPRATROPIUM-ALBUTEROL 0.5-2.5 (3) MG/3ML IN SOLN
3.0000 mL | RESPIRATORY_TRACT | Status: DC | PRN
  Administered 2023-11-11 – 2023-11-13 (×5): 3 mL via RESPIRATORY_TRACT
  Filled 2023-11-09 (×6): qty 3

## 2023-11-09 MED ORDER — POLYETHYLENE GLYCOL 3350 17 G PO PACK: 17.0000 g | PACK | Freq: Every day | ORAL | Status: DC | PRN

## 2023-11-09 MED ORDER — PREDNISONE 20 MG PO TABS: 40.0000 mg | ORAL_TABLET | Freq: Every day | ORAL | Status: DC

## 2023-11-09 MED ORDER — GUAIFENESIN 100 MG/5ML PO LIQD: 5.0000 mL | ORAL | Status: DC | PRN

## 2023-11-09 MED ORDER — ALBUTEROL SULFATE (2.5 MG/3ML) 0.083% IN NEBU
5.0000 mg | INHALATION_SOLUTION | Freq: Once | RESPIRATORY_TRACT | Status: AC
  Administered 2023-11-09: 5 mg via RESPIRATORY_TRACT
  Filled 2023-11-09: qty 6

## 2023-11-09 MED ORDER — METHYLPREDNISOLONE SODIUM SUCC 125 MG IJ SOLR
125.0000 mg | Freq: Once | INTRAMUSCULAR | Status: AC
  Administered 2023-11-09: 125 mg via INTRAVENOUS
  Filled 2023-11-09: qty 2

## 2023-11-09 MED ORDER — SODIUM CHLORIDE 0.9 % IV SOLN
1.0000 g | INTRAVENOUS | Status: DC
  Administered 2023-11-09 – 2023-11-12 (×4): 1 g via INTRAVENOUS
  Filled 2023-11-09 (×4): qty 10

## 2023-11-09 MED ORDER — ONDANSETRON HCL 4 MG PO TABS: 4.0000 mg | ORAL_TABLET | Freq: Four times a day (QID) | ORAL | Status: DC | PRN

## 2023-11-09 MED ORDER — SODIUM CHLORIDE 0.9% FLUSH
3.0000 mL | Freq: Two times a day (BID) | INTRAVENOUS | Status: DC
  Administered 2023-11-09 – 2023-11-13 (×8): 3 mL via INTRAVENOUS

## 2023-11-09 MED ORDER — ACETAMINOPHEN 650 MG RE SUPP: 650.0000 mg | Freq: Four times a day (QID) | RECTAL | Status: DC | PRN

## 2023-11-09 MED ORDER — ONDANSETRON HCL 4 MG/2ML IJ SOLN
4.0000 mg | Freq: Four times a day (QID) | INTRAMUSCULAR | Status: DC | PRN
  Administered 2023-11-11: 4 mg via INTRAVENOUS
  Filled 2023-11-09: qty 2

## 2023-11-09 MED ORDER — BISACODYL 5 MG PO TBEC: 5.0000 mg | DELAYED_RELEASE_TABLET | Freq: Every day | ORAL | Status: DC | PRN

## 2023-11-09 MED ORDER — METHYLPREDNISOLONE SODIUM SUCC 125 MG IJ SOLR
125.0000 mg | Freq: Two times a day (BID) | INTRAMUSCULAR | Status: DC
  Administered 2023-11-10: 125 mg via INTRAVENOUS
  Filled 2023-11-09: qty 2

## 2023-11-09 MED ORDER — MAGNESIUM SULFATE 2 GM/50ML IV SOLN
2.0000 g | Freq: Once | INTRAVENOUS | Status: AC
  Administered 2023-11-09: 2 g via INTRAVENOUS
  Filled 2023-11-09: qty 50

## 2023-11-09 MED ORDER — SODIUM CHLORIDE 0.9 % IV SOLN: INTRAVENOUS | Status: DC

## 2023-11-09 MED ORDER — SERTRALINE HCL 25 MG PO TABS
25.0000 mg | ORAL_TABLET | Freq: Every day | ORAL | Status: DC
  Administered 2023-11-10 – 2023-11-13 (×4): 25 mg via ORAL
  Filled 2023-11-09 (×4): qty 1

## 2023-11-09 MED ORDER — ACETAMINOPHEN 325 MG PO TABS
650.0000 mg | ORAL_TABLET | Freq: Four times a day (QID) | ORAL | Status: DC | PRN
  Administered 2023-11-10: 650 mg via ORAL
  Filled 2023-11-09: qty 2

## 2023-11-09 MED ORDER — IPRATROPIUM-ALBUTEROL 0.5-2.5 (3) MG/3ML IN SOLN
3.0000 mL | RESPIRATORY_TRACT | Status: AC
  Administered 2023-11-09 (×3): 3 mL via RESPIRATORY_TRACT
  Filled 2023-11-09: qty 6
  Filled 2023-11-09: qty 3

## 2023-11-09 NOTE — H&P (Signed)
 History and Physical    Jaime Cline ZOX:[id number] DOB: May 29, 1961 DOA: 11/09/2023  PCP: Patient, [name]   Patient coming from: SNF  Chief Complaint: SOB   HPI: Jaime Cline is a 63 y.o. 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  Patient reports 1 week of worsening dyspnea.  He has also had increase in his chronic cough.  He improves briefly with breathing treatments but soon worsens again.  There is no leg swelling, leg tenderness, or hemoptysis.  He denies any recent alcohol  or illicit substance use.  ED Course: Upon arrival to the ED, patient is found to be afebrile and saturating well on 4 L/min of supplemental oxygen with normal HR and stable BP.  Labs are most notable for sodium 128, normal creatinine, normal WBC, normal troponin, and BNP 215.  Chest x-ray reveals hyperinflation without acute findings.  Patient was given 125 mg IV Solu-Medrol , IV magnesium , albuterol , and DuoNebs in the ED.  Review of Systems:  All other systems reviewed and apart from HPI, are negative.  Past Medical History:  Diagnosis Date   Alcohol  abuse    Asthma    Chronic back pain    Chronic knee pain    COPD (chronic obstructive pulmonary disease) (HCC)    Hypertension     History reviewed. No pertinent surgical history.  Social History:   reports that he has quit smoking. His smoking use included cigarettes. He does not have any smokeless tobacco history on file. He reports that he does not currently use alcohol  after a past usage of about 28.0 standard drinks of alcohol  per week. He reports that he does not use drugs.  Allergies  Allergen Reactions   Other Other (See Comments)    BP med from [facility] about 3 years ago, caused some seizures, due to getting wrong BP med at that time.  Unknown name of med.  Allergy not listed on MAR      Family History  Problem Relation Age of Onset   Cancer Mother    Cancer Sister      Prior to Admission medications   Medication Sig Start Date End Date Taking? Authorizing Provider  acetaminophen  (TYLENOL ) 500 MG tablet Take 500 mg by mouth in the morning, at noon, and at bedtime.    [provider]  albuterol  (VENTOLIN  HFA) 108 (90 Base) MCG/ACT inhaler Inhale 1-2 puffs into the lungs every 6 (six) hours as needed for wheezing. 06/29/23   Kraig Peru, MD  aspirin  EC 81 MG tablet Take 1 tablet (81 mg total) by mouth daily. Swallow whole. 06/29/23 06/28/24  Patel, Pranav M, MD  Baclofen 5 MG TABS Take 1 tablet by mouth at bedtime. can be given every 12 hours as needed for Muscle spasms    [provider]  barrier cream (NON-SPECIFIED) CREA Apply 1 Application topically See admin instructions. 1 application to peri area every shift with all incontinence care    [provider]  Camphor-Menthol  (ARCTIC RELIEF PAIN RELIEVING) 0.2-3.5 % GEL Apply 1 Application topically in the morning and at bedtime.    [provider]  folic acid  (FOLVITE ) 1 MG tablet Take 1 tablet (1 mg total) by mouth daily. 06/29/23   Kraig Peru, MD  gabapentin  (NEURONTIN ) 300 MG capsule Take 2 capsules (600 mg total) by mouth 3 (three) times daily. 06/29/23  Kraig Peru, MD  ipratropium-albuterol  (DUONEB) 0.5-2.5 (3) MG/3ML SOLN Take 3 ml by nebulizer once daily for 5 days starting 06/26/23, then 3 ml by nebulizer twice daily Patient taking differently: Take 3 mLs by nebulization 4 (four) times daily. 06/29/23   Kraig Peru, MD  loperamide  (IMODIUM  A-D) 2 MG tablet Take 2 mg by mouth every 6 (six) hours as needed for diarrhea or loose stools.    [provider]  LORazepam  (ATIVAN ) 1 MG tablet Take 1 mg by mouth every 4 (four) hours as needed for anxiety.    [provider]  morphine  (ROXANOL) 20 MG/ML concentrated solution Take 0.25 mLs (5 mg total) by mouth  every 8 (eight) hours as needed for shortness of breath or anxiety (pain). 06/29/23   Kraig Peru, MD  nicotine  (NICODERM CQ  - DOSED IN MG/24 HOURS) 14 mg/24hr patch Place 1 patch (14 mg total) onto the skin daily. Patient not taking: Reported on 10/09/2023 06/29/23   Kraig Peru, MD  OXYGEN Inhale 2 L into the lungs continuous.    [provider]  sertraline  (ZOLOFT ) 25 MG tablet Take 25 mg by mouth daily.    [provider]  traZODone  (DESYREL ) 50 MG tablet Take 1 tablet (50 mg total) by mouth at bedtime as needed for sleep. Patient taking differently: Take 75 mg by mouth at bedtime. 10/17/22 10/09/23  Vada Garibaldi, MD  albuterol  (PROVENTIL ,VENTOLIN ) 90 MCG/ACT inhaler Inhale 2 puffs into the lungs every 6 (six) hours as needed. Shortness of breath and wheezing   03/22/12  [provider]    Physical Exam: Vitals:   11/09/23 1849 11/09/23 1850 11/09/23 1853  BP:   (!) 142/94  Pulse:   89  Resp:   12  Temp:   97.9 F (36.6 C)  TempSrc:   Oral  SpO2: 99%  100%  Weight:  40.4 kg   Height:  5\' 10"  (1.778 m)     Constitutional: NAD, cachectic   Eyes: PERTLA, lids and conjunctivae normal ENMT: Mucous membranes are moist. Posterior pharynx clear of any exudate or lesions.   Neck: supple, no masses  Respiratory: Diminished bilaterally with prolonged expiratory phase. Accessory muscle use. No rales.     Cardiovascular: S1 & S2 heard, regular rate and rhythm. No extremity edema.  Abdomen: No distension, no tenderness, soft. Bowel sounds active.  Musculoskeletal: no clubbing / cyanosis. No joint deformity upper and lower extremities.   Skin: no significant rashes, lesions, ulcers. Warm, dry, well-perfused. Neurologic: CN 2-12 grossly intact. Moving all extremities. Alert and oriented.  Psychiatric: Calm. Cooperative.    Labs and Imaging on Admission: I have personally reviewed following labs and imaging studies  CBC: Recent Labs  Lab 11/05/23 0930  11/09/23 1936  WBC 4.7 9.5  NEUTROABS  --  7.9*  HGB 10.5* 10.0*  HCT 34.5* 31.9*  MCV 95.6 96.1  PLT 335 313   Basic Metabolic Panel: Recent Labs  Lab 11/05/23 0930 11/09/23 1936  NA 129* 128*  K 3.9 3.8  CL 84* 83*  CO2 35* 31  GLUCOSE 98 98  BUN 11 12  CREATININE 0.60* 0.55*  CALCIUM  9.1 9.2   GFR: Estimated Creatinine Clearance: 54 mL/min (A) (by C-G formula based on SCr of 0.55 mg/dL (L)). Liver Function Tests: Recent Labs  Lab 11/09/23 1936  AST 18  ALT 10  ALKPHOS 53  BILITOT 0.7  PROT 7.3  ALBUMIN 4.0   No results for input(s): "LIPASE", "AMYLASE" in  the last 168 hours. No results for input(s): "AMMONIA" in the last 168 hours. Coagulation Profile: No results for input(s): "INR", "PROTIME" in the last 168 hours. Cardiac Enzymes: No results for input(s): "CKTOTAL", "CKMB", "CKMBINDEX", "TROPONINI" in the last 168 hours. BNP (last 3 results) No results for input(s): "PROBNP" in the last 8760 hours. HbA1C: No results for input(s): "HGBA1C" in the last 72 hours. CBG: No results for input(s): "GLUCAP" in the last 168 hours. Lipid Profile: No results for input(s): "CHOL", "HDL", "LDLCALC", "TRIG", "CHOLHDL", "LDLDIRECT" in the last 72 hours. Thyroid Function Tests: No results for input(s): "TSH", "T4TOTAL", "FREET4", "T3FREE", "THYROIDAB" in the last 72 hours. Anemia Panel: No results for input(s): "VITAMINB12", "FOLATE", "FERRITIN", "TIBC", "IRON", "RETICCTPCT" in the last 72 hours. Urine analysis:    Component Value Date/Time   COLORURINE YELLOW 06/26/2023 1635   APPEARANCEUR CLEAR 06/26/2023 1635   LABSPEC 1.015 06/26/2023 1635   PHURINE 6.0 06/26/2023 1635   GLUCOSEU NEGATIVE 06/26/2023 1635   HGBUR NEGATIVE 06/26/2023 1635   BILIRUBINUR NEGATIVE 06/26/2023 1635   KETONESUR NEGATIVE 06/26/2023 1635   PROTEINUR NEGATIVE 06/26/2023 1635   UROBILINOGEN 0.2 03/27/2014 0158   NITRITE NEGATIVE 06/26/2023 1635   LEUKOCYTESUR NEGATIVE 06/26/2023 1635    Sepsis Labs: @LABRCNTIP (procalcitonin:4,lacticidven:4) )No results found for this or any previous visit (from the past 240 hours).   Radiological Exams on Admission: DG Chest Port 1 View Result Date: 11/09/2023 CLINICAL DATA:  Shortness of breath. EXAM: PORTABLE CHEST 1 VIEW COMPARISON:  X-ray 11/05/2023 and older. FINDINGS: Hyperinflation. Film is rotated to the right and there is some kyphosis obscuring the right lung apex. The extreme inferior costophrenic angles are clipped off the edge of the film. No consolidation, pneumothorax or effusion. No edema. Normal cardiopericardial silhouette. Overlapping cardiac leads. IMPRESSION: Hyperinflation.  No acute cardiopulmonary disease.  Limited x-ray. Electronically Signed   By: Adrianna Horde M.D.   On: 11/09/2023 20:02    Assessment/Plan   1. COPD exacerbation; chronic hypoxic respiratory failure  - Culture sputum, start antibiotic, continue systemic steroids, continue short-acting bronchodilators, continue supplemental O2   2. Hyponatremia  - Serum sodium 128 on admission; BNP elevation noted but he does not appear hypervolemic; Zoloft  may be contributing   - Gentle hydration overnight with isotonic IVF, repeat chem panel in am   3. Anxiety   - Continue Zoloft  and as-needed Ativan     DVT prophylaxis: Lovenox   Code Status: DNR  Level of Care: Level of care: Telemetry Family Communication: None present   Disposition Plan:  Patient is from: SNF  Anticipated d/c is to: SNF  Anticipated d/c date is: 5/24 or 11/11/23  Patient currently: Pending improved respiratory status  Consults called: None  Admission status: Observation     Walton Guppy, MD [facility]  11/09/2023, 9:10 PM

## 2023-11-09 NOTE — ED Provider Notes (Signed)
 [place] EMERGENCY DEPARTMENT AT [facility] Provider Note   CSN: [id number] Arrival date & time: 11/09/23  1834     History  Chief Complaint  Patient presents with   Shortness of Breath    Jaime Cline is a 63 y.o. male.  63 yo M with a chief complaint of difficulty breathing.  Going on for the past week.  No chest pain no fevers but having some chills.  No known sick contacts.  No leg swelling.  Normally on 4 L nasal cannula.        Home Medications Prior to Admission medications   Medication Sig Start Date End Date Taking? Authorizing Provider  acetaminophen  (TYLENOL ) 500 MG tablet Take 500 mg by mouth in the morning, at noon, and at bedtime.    [provider]  albuterol  (VENTOLIN  HFA) 108 (90 Base) MCG/ACT inhaler Inhale 1-2 puffs into the lungs every 6 (six) hours as needed for wheezing. 06/29/23   Kraig Peru, MD  aspirin  EC 81 MG tablet Take 1 tablet (81 mg total) by mouth daily. Swallow whole. 06/29/23 06/28/24  Patel, Pranav M, MD  Baclofen 5 MG TABS Take 1 tablet by mouth at bedtime. can be given every 12 hours as needed for Muscle spasms    [provider]  barrier cream (NON-SPECIFIED) CREA Apply 1 Application topically See admin instructions. 1 application to peri area every shift with all incontinence care    [provider]  Camphor-Menthol  (ARCTIC RELIEF PAIN RELIEVING) 0.2-3.5 % GEL Apply 1 Application topically in the morning and at bedtime.    [provider]  folic acid  (FOLVITE ) 1 MG tablet Take 1 tablet (1 mg total) by mouth daily. 06/29/23   Kraig Peru, MD  gabapentin  (NEURONTIN ) 300 MG capsule Take 2 capsules (600 mg total) by mouth 3 (three) times daily. 06/29/23   Kraig Peru, MD  ipratropium-albuterol  (DUONEB) 0.5-2.5 (3) MG/3ML SOLN Take 3 ml by nebulizer once daily for 5 days starting 06/26/23, then 3 ml by nebulizer twice daily Patient taking differently: Take 3 mLs by nebulization 4 (four)  times daily. 06/29/23   Kraig Peru, MD  loperamide  (IMODIUM  A-D) 2 MG tablet Take 2 mg by mouth every 6 (six) hours as needed for diarrhea or loose stools.    [provider]  LORazepam  (ATIVAN ) 1 MG tablet Take 1 mg by mouth every 4 (four) hours as needed for anxiety.    [provider]  morphine  (ROXANOL) 20 MG/ML concentrated solution Take 0.25 mLs (5 mg total) by mouth every 8 (eight) hours as needed for shortness of breath or anxiety (pain). 06/29/23   Kraig Peru, MD  nicotine  (NICODERM CQ  - DOSED IN MG/24 HOURS) 14 mg/24hr patch Place 1 patch (14 mg total) onto the skin daily. Patient not taking: Reported on 10/09/2023 06/29/23   Kraig Peru, MD  OXYGEN Inhale 2 L into the lungs continuous.    [provider]  sertraline  (ZOLOFT ) 25 MG tablet Take 25 mg by mouth daily.    [provider]  traZODone  (DESYREL ) 50 MG tablet Take 1 tablet (50 mg total) by mouth at bedtime as needed for sleep. Patient taking differently: Take 75 mg by mouth at bedtime. 10/17/22 10/09/23  Vada Garibaldi, MD  albuterol  (PROVENTIL ,VENTOLIN ) 90 MCG/ACT inhaler Inhale 2 puffs into the lungs every 6 (six) hours as needed. Shortness of breath and wheezing   03/22/12  [provider]  Allergies    Other    Review of Systems   Review of Systems  Physical Exam Updated Vital Signs BP (!) 142/94 (BP Location: Left Arm)   Pulse 89   Temp 97.9 F (36.6 C) (Oral)   Resp 12   Ht 5\' 10"  (1.778 m)   Wt 40.4 kg   SpO2 100%   BMI 12.77 kg/m  Physical Exam Vitals and nursing note reviewed.  Constitutional:      Appearance: He is well-developed.  HENT:     Head: Normocephalic and atraumatic.  Eyes:     Pupils: Pupils are equal, round, and reactive to light.  Neck:     Vascular: No JVD.  Cardiovascular:     Rate and Rhythm: Normal rate and regular rhythm.     Heart sounds: No murmur heard.    No friction rub. No gallop.  Pulmonary:     Effort: No  respiratory distress.     Breath sounds: No wheezing.     Comments: Diminished breath sounds in all fields. Abdominal:     General: There is no distension.     Tenderness: There is no abdominal tenderness. There is no guarding or rebound.  Musculoskeletal:        General: Normal range of motion.     Cervical back: Normal range of motion and neck supple.  Skin:    Coloration: Skin is not pale.     Findings: No rash.  Neurological:     Mental Status: He is alert and oriented to person, place, and time.  Psychiatric:        Behavior: Behavior normal.     ED Results / Procedures / Treatments   Labs (all labs ordered are listed, but only abnormal results are displayed) Labs Reviewed  CBC WITH DIFFERENTIAL/PLATELET - Abnormal; Notable for the following components:      Result Value   RBC 3.32 (*)    Hemoglobin 10.0 (*)    HCT 31.9 (*)    Neutro Abs 7.9 (*)    All other components within normal limits  COMPREHENSIVE METABOLIC PANEL WITH GFR - Abnormal; Notable for the following components:   Sodium 128 (*)    Chloride 83 (*)    Creatinine, Ser 0.55 (*)    All other components within normal limits  BRAIN NATRIURETIC PEPTIDE - Abnormal; Notable for the following components:   B Natriuretic Peptide 215.4 (*)    All other components within normal limits  TROPONIN I (HIGH SENSITIVITY)    EKG None  Radiology DG Chest Port 1 View Result Date: 11/09/2023 CLINICAL DATA:  Shortness of breath. EXAM: PORTABLE CHEST 1 VIEW COMPARISON:  X-ray 11/05/2023 and older. FINDINGS: Hyperinflation. Film is rotated to the right and there is some kyphosis obscuring the right lung apex. The extreme inferior costophrenic angles are clipped off the edge of the film. No consolidation, pneumothorax or effusion. No edema. Normal cardiopericardial silhouette. Overlapping cardiac leads. IMPRESSION: Hyperinflation.  No acute cardiopulmonary disease.  Limited x-ray. Electronically Signed   By: Adrianna Horde M.D.    On: 11/09/2023 20:02    Procedures .Critical Care  Performed by: Albertus Hughs, DO Authorized by: Albertus Hughs, DO   Critical care provider statement:    Critical care time (minutes):  35   Critical care time was exclusive of:  Separately billable procedures and treating other patients   Critical care was time spent personally by me on the following activities:  Development of treatment plan with patient or  surrogate, discussions with consultants, evaluation of patient's response to treatment, examination of patient, ordering and review of laboratory studies, ordering and review of radiographic studies, ordering and performing treatments and interventions, pulse oximetry, re-evaluation of patient's condition and review of old charts   Care discussed with: admitting provider       Medications Ordered in ED Medications  albuterol  (PROVENTIL ) (2.5 MG/3ML) 0.083% nebulizer solution 5 mg (has no administration in time range)  ipratropium-albuterol  (DUONEB) 0.5-2.5 (3) MG/3ML nebulizer solution 3 mL (3 mLs Nebulization Given 11/09/23 1929)  magnesium  sulfate IVPB 2 g 50 mL (2 g Intravenous New Bag/Given 11/09/23 1931)  methylPREDNISolone  sodium succinate  (SOLU-MEDROL ) 125 mg/2 mL injection 125 mg (125 mg Intravenous Given 11/09/23 1927)    ED Course/ Medical Decision Making/ A&P                                 Medical Decision Making Amount and/or Complexity of Data Reviewed Labs: ordered. Radiology: ordered.  Risk Prescription drug management.   63 yo M with a chief complaints of difficulty breathing.  Patient has a significant past medical history of COPD and is currently on hospice.  He feels like things got worse over the past week.  Will obtain a laboratory evaluation chest x-ray 3 DuoNebs back-to-back steroids magnesium  reassessment.  No acute anemia.  Hyponatremia hypochloremia.  BNP in the indeterminate range.  Chest x-ray independently by me without obvious focal infiltrate.   Troponin negative.  Patient was reassessed and is feeling a bit better.  He still feels like he is having significant difficulty breathing and does not feel comfortable going home.  Will give another neb here.  Will discuss with medicine.  The patients results and plan were reviewed and discussed.   Any x-rays performed were independently reviewed by myself.   Differential diagnosis were considered with the presenting HPI.  Medications  albuterol  (PROVENTIL ) (2.5 MG/3ML) 0.083% nebulizer solution 5 mg (has no administration in time range)  ipratropium-albuterol  (DUONEB) 0.5-2.5 (3) MG/3ML nebulizer solution 3 mL (3 mLs Nebulization Given 11/09/23 1929)  magnesium  sulfate IVPB 2 g 50 mL (2 g Intravenous New Bag/Given 11/09/23 1931)  methylPREDNISolone  sodium succinate  (SOLU-MEDROL ) 125 mg/2 mL injection 125 mg (125 mg Intravenous Given 11/09/23 1927)    Vitals:   11/09/23 1849 11/09/23 1850 11/09/23 1853  BP:   (!) 142/94  Pulse:   89  Resp:   12  Temp:   97.9 F (36.6 C)  TempSrc:   Oral  SpO2: 99%  100%  Weight:  40.4 kg   Height:  5\' 10"  (1.778 m)     Final diagnoses:  Acute on chronic respiratory failure with hypoxia (HCC)  COPD exacerbation (HCC)    Admission/ observation were discussed with the admitting physician, patient and/or family and they are comfortable with the plan.          Final Clinical Impression(s) / ED Diagnoses Final diagnoses:  Acute on chronic respiratory failure with hypoxia (HCC)  COPD exacerbation [facility])    Rx / DC Orders ED Discharge Orders     None         Albertus Hughs, DO 11/09/23 2042

## 2023-11-09 NOTE — ED Notes (Signed)
 Xray bedside.

## 2023-11-09 NOTE — ED Triage Notes (Signed)
 Pt. BIBEMS from [facility] due to c/o SOB. Pt. A&O x 4. Pt. On 4L [place] per facility. Hx of COPD.  Per EMS: BP: 148/86 HR: 90 SPO2: 99% O2 via [place]

## 2023-11-10 DIAGNOSIS — Z809 Family history of malignant neoplasm, unspecified: Secondary | ICD-10-CM | POA: Diagnosis not present

## 2023-11-10 DIAGNOSIS — R627 Adult failure to thrive: Secondary | ICD-10-CM | POA: Diagnosis not present

## 2023-11-10 DIAGNOSIS — Z681 Body mass index (BMI) 19 or less, adult: Secondary | ICD-10-CM | POA: Diagnosis not present

## 2023-11-10 DIAGNOSIS — Z87891 Personal history of nicotine dependence: Secondary | ICD-10-CM | POA: Diagnosis not present

## 2023-11-10 DIAGNOSIS — I10 Essential (primary) hypertension: Secondary | ICD-10-CM | POA: Diagnosis present

## 2023-11-10 DIAGNOSIS — Z888 Allergy status to other drugs, medicaments and biological substances status: Secondary | ICD-10-CM | POA: Diagnosis not present

## 2023-11-10 DIAGNOSIS — R1310 Dysphagia, unspecified: Secondary | ICD-10-CM | POA: Diagnosis present

## 2023-11-10 DIAGNOSIS — J9611 Chronic respiratory failure with hypoxia: Secondary | ICD-10-CM | POA: Diagnosis present

## 2023-11-10 DIAGNOSIS — Z7982 Long term (current) use of aspirin: Secondary | ICD-10-CM | POA: Diagnosis not present

## 2023-11-10 DIAGNOSIS — Z7189 Other specified counseling: Secondary | ICD-10-CM | POA: Diagnosis not present

## 2023-11-10 DIAGNOSIS — Z515 Encounter for palliative care: Secondary | ICD-10-CM | POA: Diagnosis not present

## 2023-11-10 DIAGNOSIS — F419 Anxiety disorder, unspecified: Secondary | ICD-10-CM

## 2023-11-10 DIAGNOSIS — R64 Cachexia: Secondary | ICD-10-CM | POA: Diagnosis present

## 2023-11-10 DIAGNOSIS — Z66 Do not resuscitate: Secondary | ICD-10-CM | POA: Diagnosis present

## 2023-11-10 DIAGNOSIS — Z79899 Other long term (current) drug therapy: Secondary | ICD-10-CM | POA: Diagnosis not present

## 2023-11-10 DIAGNOSIS — J449 Chronic obstructive pulmonary disease, unspecified: Secondary | ICD-10-CM | POA: Diagnosis not present

## 2023-11-10 DIAGNOSIS — E871 Hypo-osmolality and hyponatremia: Secondary | ICD-10-CM | POA: Diagnosis present

## 2023-11-10 DIAGNOSIS — J441 Chronic obstructive pulmonary disease with (acute) exacerbation: Secondary | ICD-10-CM | POA: Diagnosis present

## 2023-11-10 DIAGNOSIS — R4589 Other symptoms and signs involving emotional state: Secondary | ICD-10-CM | POA: Diagnosis not present

## 2023-11-10 LAB — BASIC METABOLIC PANEL WITH GFR
Anion gap: 11 (ref 5–15)
BUN: 13 mg/dL (ref 8–23)
CO2: 34 mmol/L — ABNORMAL HIGH (ref 22–32)
Calcium: 9.6 mg/dL (ref 8.9–10.3)
Chloride: 85 mmol/L — ABNORMAL LOW (ref 98–111)
Creatinine, Ser: 0.59 mg/dL — ABNORMAL LOW (ref 0.61–1.24)
GFR, Estimated: >60 mL/min (ref >60)
Glucose, Bld: 127 mg/dL — ABNORMAL HIGH (ref 70–99)
Potassium: 4.3 mmol/L (ref 3.5–5.1)
Sodium: 130 mmol/L — ABNORMAL LOW (ref 135–145)

## 2023-11-10 LAB — CBC
HCT: 35.3 % — ABNORMAL LOW (ref 39.0–52.0)
Hemoglobin: 11.1 g/dL — ABNORMAL LOW (ref 13.0–17.0)
MCH: 30 pg (ref 26.0–34.0)
MCHC: 31.4 g/dL (ref 30.0–36.0)
MCV: 95.4 fL (ref 80.0–100.0)
Platelets: 368 10*3/uL (ref 150–400)
RBC: 3.7 MIL/uL — ABNORMAL LOW (ref 4.22–5.81)
RDW: 13.7 % (ref 11.5–15.5)
WBC: 5.5 10*3/uL (ref 4.0–10.5)
nRBC: 0 % (ref 0.0–0.2)

## 2023-11-10 LAB — GLUCOSE, CAPILLARY: Glucose-Capillary: 131 mg/dL — ABNORMAL HIGH (ref 70–99)

## 2023-11-10 MED ORDER — ORAL CARE MOUTH RINSE: 15.0000 mL | OROMUCOSAL | Status: DC | PRN

## 2023-11-10 MED ORDER — METHYLPREDNISOLONE SODIUM SUCC 40 MG IJ SOLR
40.0000 mg | Freq: Two times a day (BID) | INTRAMUSCULAR | Status: DC
  Administered 2023-11-10 – 2023-11-13 (×6): 40 mg via INTRAVENOUS
  Filled 2023-11-10 (×6): qty 1

## 2023-11-10 MED ORDER — GUAIFENESIN 100 MG/5ML PO LIQD
15.0000 mL | Freq: Four times a day (QID) | ORAL | Status: DC
  Administered 2023-11-11 – 2023-11-13 (×10): 15 mL via ORAL
  Filled 2023-11-10 (×10): qty 20

## 2023-11-10 NOTE — Plan of Care (Signed)

## 2023-11-10 NOTE — Plan of Care (Signed)
 Respiration rate in the 20s, all other VSS. Patient on 3L [place] with O2 sat above 95%. No c/o pain. Patient voiding using urinal. LBM 5/23. No acute events overnight.  Problem: Education: Goal: Knowledge of General Education information will improve Description: Including pain rating scale, medication(s)/side effects and non-pharmacologic comfort measures Outcome: Progressing   Problem: Health Behavior/Discharge Planning: Goal: Ability to manage health-related needs will improve Outcome: Progressing   Problem: Clinical Measurements: Goal: Ability to maintain clinical measurements within normal limits will improve Outcome: Progressing Goal: Will remain free from infection Outcome: Progressing Goal: Respiratory complications will improve Outcome: Progressing   Problem: Safety: Goal: Ability to remain free from injury will improve Outcome: Progressing   Problem: Skin Integrity: Goal: Risk for impaired skin integrity will decrease Outcome: Progressing   Problem: Education: Goal: Knowledge of disease or condition will improve Outcome: Progressing Goal: Knowledge of the prescribed therapeutic regimen will improve Outcome: Progressing   Problem: Activity: Goal: Ability to tolerate increased activity will improve Outcome: Progressing   Problem: Respiratory: Goal: Ability to maintain a clear airway will improve Outcome: Progressing Goal: Levels of oxygenation will improve Outcome: Progressing Goal: Ability to maintain adequate ventilation will improve Outcome: Progressing

## 2023-11-10 NOTE — Progress Notes (Signed)
 [facility]  PROGRESS NOTE  COOLIDGE GOSSARD JXB:[id number] DOB: 05-09-1961 DOA: 11/09/2023 PCP: Patient, [name]   Brief HPI:    63 y.o. 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   Patient reports 1 week of worsening dyspnea.  He has also had increase in his chronic cough.  He improves briefly with breathing treatments but soon worsens again.  There is no leg swelling, leg tenderness, or hemoptysis.  He denies any recent alcohol  or illicit substance use.      Assessment/Plan:   1. COPD exacerbation; chronic hypoxic respiratory failure  - Culture sputum, start antibiotic, continue systemic steroids, continue short-acting bronchodilators, continue supplemental O2  -   2. Hyponatremia  - Serum sodium 128 on admission;  - Improved to 130 with hydration    3. Anxiety   - Continue Zoloft  and as-needed Ativan     Medications     enoxaparin  (LOVENOX ) injection  30 mg Subcutaneous Q24H   ipratropium-albuterol   3 mL Nebulization Q6H   methylPREDNISolone  (SOLU-MEDROL ) injection  125 mg Intravenous Q12H   Followed by   Cecily Cohen ON 11/11/2023] predniSONE   40 mg Oral Q breakfast   sertraline   25 mg Oral Daily   sodium chloride  flush  3 mL Intravenous Q12H     Data Reviewed:   CBG:  No results for input(s): "GLUCAP" in the last 168 hours.  SpO2: 99 % O2 Flow Rate (L/min): 3 L/min FiO2 (%): 32 %    Vitals:   11/09/23 2202 11/10/23 0202 11/10/23 0557 11/10/23 0756  BP: (!) 145/91 118/87 (!) 134/92   Pulse: 98 87 83   Resp: 18 20 18  (!) 21  Temp: (!) 97.5 F (36.4 C) 97.6 F (36.4 C) 97.9 F (36.6 C)   TempSrc: Oral Oral Oral   SpO2: 100% 100% 100% 99%  Weight: 40.6 kg     Height: 5\' 10"  (1.778 m)         Data Reviewed:  Basic Metabolic Panel: Recent Labs  Lab 11/05/23 0930 11/09/23 1936  NA 129* 128*  K  3.9 3.8  CL 84* 83*  CO2 35* 31  GLUCOSE 98 98  BUN 11 12  CREATININE 0.60* 0.55*  CALCIUM  9.1 9.2    CBC: Recent Labs  Lab 11/05/23 0930 11/09/23 1936 11/10/23 0639  WBC 4.7 9.5 5.5  NEUTROABS  --  7.9*  --   HGB 10.5* 10.0* 11.1*  HCT 34.5* 31.9* 35.3*  MCV 95.6 96.1 95.4  PLT 335 313 368    LFT Recent Labs  Lab 11/09/23 1936  AST 18  ALT 10  ALKPHOS 53  BILITOT 0.7  PROT 7.3  ALBUMIN 4.0     Antibiotics: Anti-infectives (From admission, onward)    Start     Dose/Rate Route Frequency Ordered Stop   11/09/23 2115  cefTRIAXone  (ROCEPHIN ) 1 g in sodium chloride  0.9 % 100 mL IVPB        1 g 200 mL/hr over 30 Minutes Intravenous Every 24 hours 11/09/23 2110 11/14/23 2114        DVT prophylaxis: Lovenox   Code Status: DNR  Family Communication:    CONSULTS    Subjective   Complains of dyspnea on exertion   Objective    Physical Examination:   General-appears in no acute distress Heart-S1-S2, regular, no murmur auscultated Lungs-decreased breath sounds bilaterally Abdomen-soft, nontender, no organomegaly Extremities-no edema  in the lower extremities Neuro-alert, oriented x3, no focal deficit noted  Status is: Inpatient:             Ozell Blunt   [facility] If 7PM-7AM, please contact night-coverage at [email or web address], Office  [phone]   11/10/2023, 8:16 AM  LOS: 0 days

## 2023-11-10 NOTE — Progress Notes (Signed)
 [facility] Liaison Note  Jaime Cline is a current hospice patient with [facility] with a terminal diagnosis of chronic obstructive pulmonary disease. Patient called 911 himself from his facility, Jaime Cline, stating he needed to go to the hospital as he felt he was dying. Facility staff offered Ativan  and Morphine , though patient refused. EMS transported patient to ED. Jaime Cline has been admitted to [facility] on 5.23.2025 for COPD exacerbation. Per Dr. Salome Credit with [facility], this is a related hospital admission.  Visited patient at bedside, he reports he feels terrible, that his breathing is not good, though he does not appear to be in any distress. He does have his nasal cannula in his mouth, room is cool and tabletop fan is blowing on him. He also reports he has left hand and leg tingling. Assessed, no weakness nor rash noted. Relayed this information to Dr. Alfonse Angle.  Patient is inpatient appropriate for administration of IV steroids, IV antibiotics, IV electrolyte replacement and continued monitoring of respiratory status.  VS: 142/82, 84, 20, 100% on 3 lpm [place]  I/O: 100/400  Abnormal Labs: Sodium: 130 (L) Potassium: 4.3 Chloride: 85 (L) CO2: 34 (H) Glucose: 127 (H) Creatinine: 0.59 (L) RBC: 3.70 (L) Hemoglobin: 11.1 (L) HCT: 35.3 (L) BNP: 215.4  Diagnostics: Portable Chest Xray - 1 view IMPRESSION: Hyperinflation.  No acute cardiopulmonary disease.  Limited x-ray.  IV/PRN Meds: Solumedrol 125 mg/2ml IV Q 12 hours, Rocephin  1 g IV Q 24 hours, magnesium  2 g IV X 1.  Assessment/Plan (per Dr. Kern Pedro note 5.23.2025)   1. COPD exacerbation; chronic hypoxic respiratory failure  - Culture sputum, start antibiotic, continue systemic steroids, continue short-acting bronchodilators, continue supplemental O2    2. Hyponatremia  - Serum sodium 128 on admission; BNP elevation noted but he does not appear hypervolemic; Zoloft  may be  contributing   - Gentle hydration overnight with isotonic IVF, repeat chem panel in am    3. Anxiety   - Continue Zoloft  and as-needed Ativan    Discharge Planning: return to facility with hospice when medically stable.  Family Contact: pt is his own spokesperson  IDT: updated  Goals of Care: not clear due to patients anxiety. Continued discussions regarding comfort vs aggressive care and returning to hospital.  Medication list and transfer summary sent for upload to patients chart.  Please call with any hospice related questions or concerns.  Please use [facility] for transport at time of discharge as [employer] with them for our active hospice patients.  Thank you, Lestine Rathke, BSN, [facility] [phone]

## 2023-11-11 DIAGNOSIS — J9611 Chronic respiratory failure with hypoxia: Secondary | ICD-10-CM | POA: Diagnosis not present

## 2023-11-11 DIAGNOSIS — F419 Anxiety disorder, unspecified: Secondary | ICD-10-CM | POA: Diagnosis not present

## 2023-11-11 DIAGNOSIS — J441 Chronic obstructive pulmonary disease with (acute) exacerbation: Secondary | ICD-10-CM | POA: Diagnosis not present

## 2023-11-11 DIAGNOSIS — E871 Hypo-osmolality and hyponatremia: Secondary | ICD-10-CM

## 2023-11-11 LAB — CBC
HCT: 34.3 % — ABNORMAL LOW (ref 39.0–52.0)
Hemoglobin: 10.7 g/dL — ABNORMAL LOW (ref 13.0–17.0)
MCH: 30.4 pg (ref 26.0–34.0)
MCHC: 31.2 g/dL (ref 30.0–36.0)
MCV: 97.4 fL (ref 80.0–100.0)
Platelets: 417 10*3/uL — ABNORMAL HIGH (ref 150–400)
RBC: 3.52 MIL/uL — ABNORMAL LOW (ref 4.22–5.81)
RDW: 14.1 % (ref 11.5–15.5)
WBC: 8.4 10*3/uL (ref 4.0–10.5)
nRBC: 0 % (ref 0.0–0.2)

## 2023-11-11 LAB — BASIC METABOLIC PANEL WITH GFR
Anion gap: 13 (ref 5–15)
BUN: 21 mg/dL (ref 8–23)
CO2: 28 mmol/L (ref 22–32)
Calcium: 9.5 mg/dL (ref 8.9–10.3)
Chloride: 88 mmol/L — ABNORMAL LOW (ref 98–111)
Creatinine, Ser: 0.57 mg/dL — ABNORMAL LOW (ref 0.61–1.24)
GFR, Estimated: >60 mL/min (ref >60)
Glucose, Bld: 114 mg/dL — ABNORMAL HIGH (ref 70–99)
Potassium: 4.4 mmol/L (ref 3.5–5.1)
Sodium: 129 mmol/L — ABNORMAL LOW (ref 135–145)

## 2023-11-11 NOTE — Evaluation (Addendum)
 Clinical/Bedside Swallow Evaluation Patient Details  Name: KOLBE DELMONACO MRN: [id number] Date of Birth: [birth year]  Today's Date: 11/11/2023 Time: SLP Start Time (ACUTE ONLY): 1646 SLP Stop Time (ACUTE ONLY): 1701 SLP Time Calculation (min) (ACUTE ONLY): 15 min  Past Medical History:  Past Medical History:  Diagnosis Date   Alcohol  abuse    Asthma    Chronic back pain    Chronic knee pain    COPD (chronic obstructive pulmonary disease) (HCC)    Hypertension    Past Surgical History: History reviewed. No pertinent surgical history. HPI:  63 y.o. male presents from [facility] with worsening SOB. Dx COPD exacerbation, chronic hypoxic resp failure. PMHx polysubstance abuse, chronic pain, anxiety, severe COPD on hospice, and chronic hypoxic respiratory failure. MBS 08/18/2022: no aspiration; no pharyngeal residue; CP bar Has had several clinical swallow assessments over the last few years, with some question of potential esophageal dysphagia and concern for post-prandial aspiration. Esophagram from April 2023 found "disruption of primary peristaltic waves on almost all swallows, with to and fro motion of barium in the esophagus, compatible with  nonspecific esophageal dysmotility disorder."    Assessment / Plan / Recommendation  Clinical Impression  Pt presents with a chronic primary esophageal dysphagia.  Symptoms of globus occur with every meal, often after only 3-4 bites.  During today's assessment he began to describe sensation of pressure after three teaspoons of applesauce. There were no indications of an oropharyngeal deficit - demonstrated adequate mastication despite absence of teeth; no overt s/s of aspiraiton.  Review of esophagram from April 2023 reveals dysmotility that likely has a significant impact on his eating and comfort.  He is managing POs as well as able, but even liquids tend to remain in esophagus per review of study.   DIfficult situation. Recommend continuing regular  diet to allow pt to self-select foods he can eat; keep HOB elevated at night; avoid PO intake 2 hours before bedtime.  D/W Mr. Brau, provided him with handout of esophageal precautions. No further SLP intervention is warranted.  Our service will sign off.   SLP Visit Diagnosis: Dysphagia, pharyngoesophageal phase (R13.14)    Aspiration Risk       Diet Recommendation   Thin;Age appropriate regular  Medication Administration: Whole meds with liquid    Other  Recommendations Recommended Consults: Consider GI evaluation Oral Care Recommendations: Oral care BID    Recommendations for follow up therapy are one component of a multi-disciplinary discharge planning process, led by the attending physician.  Recommendations may be updated based on patient status, additional functional criteria and insurance authorization.  Follow up Recommendations No SLP follow up        Swallow Study   General Date of Onset: 11/10/23 HPI: 63 y.o. male presents from [facility] with worsening SOB. Dx COPD exacerbation, chronic hypoxic resp failure. PMHx polysubstance abuse, chronic pain, anxiety, severe COPD on hospice, and chronic hypoxic respiratory failure. MBS 08/18/2022: no aspiration; no pharyngeal residue; CP bar Has had several clinical swallow assessments over the last few years, with some question of potential esophageal dysphagia and concern for post-prandial aspiration. Esophagram from April 2023 found "disruption of primary peristaltic waves on almost all swallows, with to and fro motion of barium in the esophagus, compatible with  nonspecific esophageal dysmotility disorder." Type of Study: Bedside Swallow Evaluation Previous Swallow Assessment: see HPI Diet Prior to this Study: Regular;Thin liquids (Level 0) Temperature Spikes Noted: No Respiratory Status: Nasal cannula History of Recent Intubation: No Behavior/Cognition:  Alert;Cooperative Oral Cavity Assessment: Within Functional Limits Oral  Care Completed by SLP: No Oral Cavity - Dentition: Edentulous Vision: Functional for self-feeding Self-Feeding Abilities: Able to feed self Patient Positioning: Upright in bed Baseline Vocal Quality: Normal Volitional Cough: Strong Volitional Swallow: Able to elicit    Oral/Motor/Sensory Function Overall Oral Motor/Sensory Function: Within functional limits   Ice Chips Ice chips: Not tested   Thin Liquid Thin Liquid: Within functional limits    Nectar Thick Nectar Thick Liquid: Not tested   Honey Thick Honey Thick Liquid: Not tested   Puree Puree: Within functional limits   Solid     Solid: Within functional limits      Myna Asal Laurice 11/11/2023,5:14 PM  Mylinda Asa L. Beatris Lincoln, MA CCC/SLP Clinical Specialist - Acute Care SLP Acute Rehabilitation Services Office number [phone]

## 2023-11-11 NOTE — Progress Notes (Signed)
 [facility] Liaison Note   Jaime Cline is a current hospice patient with [facility] with a terminal diagnosis of chronic obstructive pulmonary disease. Patient called 911 himself from his facility, [name], stating he needed to go to the hospital as he felt he was dying. Facility staff offered Ativan  and Morphine , though patient refused. EMS transported patient to ED. Jaime Cline has been admitted to [facility] on 5.23.2025 for COPD exacerbation. Per Dr. Salome Credit with [facility], this is a related hospital admission.  Visited with patient at bedside. He reports he is feeling better today, denies pain and reports his breathing is better. A lunch tray is half eaten. Jaime Cline reports his doctor has said discharge will likely be tomorrow.  VS: 98 oral, 138/88, 80, 18, 96% on 3 lpm [place]  Abnormal Labs: Sodium: 129 (L) Potassium: 4.4 Chloride: 88 (L) Glucose: 114 (H) Creatinine: 0.57 (L) RBC: 3.52 (L) Hemoglobin: 10.7 (L) HCT: 34.3 (L) Platelets: 417 (H)  Diagnostics: none new  IV/PRN Meds: Solumedrol 40 mg IV Q 12 hours, Rocephin  1 g IV Q 24 hours, Ativan  1 mg po X 3, Morphine  5mg  oral concentrate por X 1, Zofran  4 mg IV X1  Assessment/Plan (per Dr. Lea Primmer note 5.25.2025)  1. COPD exacerbation; chronic hypoxic respiratory failure  -Breathing has improved -Continue ceftriaxone , Solu-Medrol , DuoNebs every 6 hours, guaifenesin  300 mg every 6 hours, -Currently requiring 3 L/min of oxygen via nasal cannula   2. Hyponatremia  - Serum sodium 128 on admission;  --Serum sodium is 129 today   3. Anxiety   - Continue Zoloft  and as-needed Ativan    4.  Dysphagia - Complains of dysphagia, will obtain swallow evaluation  Discharge Planning: return to facility with hospice when medically stable.   Family Contact: pt is his own spokesperson   IDT: updated   Goals of Care: not clear due to patients anxiety. Continued discussions  regarding comfort vs aggressive care and returning to hospital.   Please call with any hospice related questions or concerns.   Please use [facility] for transport at time of discharge as [employer] with them for our active hospice patients.   Thank you, Lestine Rathke, BSN, [facility] [phone]

## 2023-11-11 NOTE — Plan of Care (Signed)

## 2023-11-11 NOTE — Progress Notes (Signed)
 [facility]  PROGRESS NOTE  AMBERS IYENGAR UXL:[id number] DOB: July 05, 1960 DOA: 11/09/2023 PCP: Patient, [name]   Brief HPI:    63 y.o. 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   Patient reports 1 week of worsening dyspnea.  He has also had increase in his chronic cough.  He improves briefly with breathing treatments but soon worsens again.  There is no leg swelling, leg tenderness, or hemoptysis.  He denies any recent alcohol  or illicit substance use.      Assessment/Plan:   1. COPD exacerbation; chronic hypoxic respiratory failure  -Breathing has improved -Continue ceftriaxone , Solu-Medrol , DuoNebs every 6 hours, guaifenesin  300 mg every 6 hours, -Currently requiring 3 L/min of oxygen via nasal cannula  -   2. Hyponatremia  - Serum sodium 128 on admission;  --Serum sodium is 129 today   3. Anxiety   - Continue Zoloft  and as-needed Ativan   4.  Dysphagia - Complains of dysphagia, will obtain swallow evaluation  Medications     enoxaparin  (LOVENOX ) injection  30 mg Subcutaneous Q24H   guaiFENesin   15 mL Oral Q6H   ipratropium-albuterol   3 mL Nebulization Q6H   methylPREDNISolone  (SOLU-MEDROL ) injection  40 mg Intravenous Q12H   sertraline   25 mg Oral Daily   sodium chloride  flush  3 mL Intravenous Q12H     Data Reviewed:   CBG:  Recent Labs  Lab 11/10/23 1324  GLUCAP 131*    SpO2: 100 % O2 Flow Rate (L/min): 3 L/min FiO2 (%): 32 %    Vitals:   11/11/23 0237 11/11/23 0415 11/11/23 0849 11/11/23 1321  BP:  138/88  111/68  Pulse:  80  86  Resp:  18  20  Temp:  98 F (36.7 C)  97.7 F (36.5 C)  TempSrc:  Oral  Oral  SpO2: 95% 96% 96% 100%  Weight:      Height:          Data Reviewed:  Basic Metabolic Panel: Recent Labs  Lab 11/05/23 0930 11/09/23 1936 11/10/23 0639 11/11/23 0600   NA 129* 128* 130* 129*  K 3.9 3.8 4.3 4.4  CL 84* 83* 85* 88*  CO2 35* 31 34* 28  GLUCOSE 98 98 127* 114*  BUN 11 12 13 21   CREATININE 0.60* 0.55* 0.59* 0.57*  CALCIUM  9.1 9.2 9.6 9.5    CBC: Recent Labs  Lab 11/05/23 0930 11/09/23 1936 11/10/23 0639 11/11/23 0600  WBC 4.7 9.5 5.5 8.4  NEUTROABS  --  7.9*  --   --   HGB 10.5* 10.0* 11.1* 10.7*  HCT 34.5* 31.9* 35.3* 34.3*  MCV 95.6 96.1 95.4 97.4  PLT 335 313 368 417*    LFT Recent Labs  Lab 11/09/23 1936  AST 18  ALT 10  ALKPHOS 53  BILITOT 0.7  PROT 7.3  ALBUMIN 4.0     Antibiotics: Anti-infectives (From admission, onward)    Start     Dose/Rate Route Frequency Ordered Stop   11/09/23 2115  cefTRIAXone  (ROCEPHIN ) 1 g in sodium chloride  0.9 % 100 mL IVPB        1 g 200 mL/hr over 30 Minutes Intravenous Every 24 hours 11/09/23 2110 11/14/23 2114        DVT prophylaxis: Lovenox   Code Status: DNR  Family Communication:    CONSULTS    Subjective   Breathing has improved today.  Complains of difficulty swallowing.   Objective    Physical Examination:  General-appears in no acute distress Heart-S1-S2, regular, no murmur auscultated Lungs-decreased breath sounds bilaterally Abdomen-soft, nontender, no organomegaly Extremities-no edema in the lower extremities Neuro-alert, oriented x3, no focal deficit noted   Status is: Inpatient:             Kayann Maj S Lilliam Chamblee   [facility] If 7PM-7AM, please contact night-coverage at [email or web address], Office  [phone]   11/11/2023, 2:30 PM  LOS: 1 day

## 2023-11-12 DIAGNOSIS — Z515 Encounter for palliative care: Secondary | ICD-10-CM | POA: Diagnosis not present

## 2023-11-12 DIAGNOSIS — J9611 Chronic respiratory failure with hypoxia: Secondary | ICD-10-CM | POA: Diagnosis not present

## 2023-11-12 DIAGNOSIS — J441 Chronic obstructive pulmonary disease with (acute) exacerbation: Secondary | ICD-10-CM | POA: Diagnosis not present

## 2023-11-12 DIAGNOSIS — R627 Adult failure to thrive: Secondary | ICD-10-CM | POA: Diagnosis not present

## 2023-11-12 DIAGNOSIS — J449 Chronic obstructive pulmonary disease, unspecified: Secondary | ICD-10-CM | POA: Diagnosis not present

## 2023-11-12 DIAGNOSIS — F419 Anxiety disorder, unspecified: Secondary | ICD-10-CM | POA: Diagnosis not present

## 2023-11-12 DIAGNOSIS — Z7189 Other specified counseling: Secondary | ICD-10-CM | POA: Diagnosis not present

## 2023-11-12 MED ORDER — LORAZEPAM 1 MG PO TABS
1.0000 mg | ORAL_TABLET | Freq: Every day | ORAL | Status: DC
  Administered 2023-11-12: 1 mg via ORAL
  Filled 2023-11-12: qty 1

## 2023-11-12 MED ORDER — MORPHINE SULFATE (CONCENTRATE) 10 MG /0.5 ML PO SOLN
5.0000 mg | Freq: Four times a day (QID) | ORAL | Status: DC | PRN
  Administered 2023-11-12 – 2023-11-13 (×3): 5 mg via ORAL
  Filled 2023-11-12 (×3): qty 0.5

## 2023-11-12 MED ORDER — ONDANSETRON HCL 4 MG PO TABS
8.0000 mg | ORAL_TABLET | Freq: Three times a day (TID) | ORAL | Status: DC
  Administered 2023-11-13 (×2): 8 mg via ORAL
  Filled 2023-11-12 (×2): qty 2

## 2023-11-12 MED ORDER — DRONABINOL 2.5 MG PO CAPS
2.5000 mg | ORAL_CAPSULE | Freq: Two times a day (BID) | ORAL | Status: DC
  Administered 2023-11-13: 2.5 mg via ORAL
  Filled 2023-11-12: qty 1

## 2023-11-12 NOTE — Plan of Care (Signed)
  Problem: Clinical Measurements: Goal: Respiratory complications will improve Outcome: Progressing   Problem: Nutrition: Goal: Adequate nutrition will be maintained Outcome: Progressing   Problem: Coping: Goal: Level of anxiety will decrease Outcome: Progressing   Problem: Elimination: Goal: Will not experience complications related to bowel motility Outcome: Progressing   Problem: Pain Managment: Goal: General experience of comfort will improve and/or be controlled Outcome: Progressing   Problem: Safety: Goal: Ability to remain free from injury will improve Outcome: Progressing   Problem: Skin Integrity: Goal: Risk for impaired skin integrity will decrease Outcome: Progressing   Problem: Respiratory: Goal: Levels of oxygenation will improve Outcome: Progressing

## 2023-11-12 NOTE — Plan of Care (Signed)
  Problem: Education: Goal: Knowledge of General Education information will improve Description: Including pain rating scale, medication(s)/side effects and non-pharmacologic comfort measures Outcome: Progressing   Problem: Clinical Measurements: Goal: Ability to maintain clinical measurements within normal limits will improve Outcome: Progressing Goal: Diagnostic test results will improve Outcome: Progressing Goal: Respiratory complications will improve Outcome: Progressing   Problem: Nutrition: Goal: Adequate nutrition will be maintained Outcome: Progressing   Problem: Coping: Goal: Level of anxiety will decrease Outcome: Progressing   Problem: Activity: Goal: Ability to tolerate increased activity will improve Outcome: Progressing

## 2023-11-12 NOTE — Consult Note (Signed)
 Palliative Medicine Inpatient Consult Note  Consulting Provider: Dr. Alfonse Angle  Reason for consult:   Palliative Care Consult Services Symptom Management Consult  Reason for Consult? Symptom management   11/12/2023  HPI:  Per intake H&P -->  63 y.o. 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  Being treated for a COPD exacerbation.   Clinical Assessment/Goals of Care:  *Please note that this is a verbal dictation therefore any spelling or grammatical errors are due to the "Dragon Medical One" system interpretation.  I have reviewed medical records including EPIC notes, labs and imaging, received report from bedside RN, assessed the patient who is lying in bed chronically ill appearing and shares he feels cold.    I met with Gerry Krone to further discuss diagnosis prognosis, GOC, EOL wishes, disposition and options.   I introduced Palliative Medicine as specialized medical care for people living with serious illness. It focuses on providing relief from the symptoms and stress of a serious illness. The goal is to improve quality of life for both the patient and the family.  Medical History Review and Understanding:  A review of Abdalrahman's past medical history significant for chronic pain, severe COPD, alcoholism, and hypertension was completed.  Social History:  Rueben moved from [place]  to [place]  around 2022.  He shares he is not married though does have 1 child who lives out of state.  He has no grandchildren.  Nicolaas does not work and is disabled. He previously enjoyed fishing and spending time outdoors.  He previously had a dog named Sugar who he loves tremendously.  Marton has a strong faith practicing within Christianity.  Functional and Nutritional State:  Treyon has lived at [facility] for over a year now he requires help with  B ADLs due to his profound symptoms-shortness of breath related to his end-stage COPD.  Archimedes is able to eat though very slowly as if he eats quickly he feels like it overwhelms him.  Advance Directives:  A detailed discussion was had today regarding advanced directives.  Hendrik has no formal advanced directives though he does desire his son, Junior be a Runner, broadcasting/film/video should that be needed.  Code Status: Concepts specific to code status, artifical feeding and hydration, continued IV antibiotics and rehospitalization was had.  The difference between a aggressive medical intervention path  and a palliative comfort care path for this patient at this time was had.   Trebor is an established DO NOT RESUSCITATE DO NOT INTUBATE CODE STATUS.  Discussion:  Muneer and I reviewed that he has been at [facility] where he does where he they do not take the best care of him.  He is understanding that facilities where patients can be long-term or difficult to find - often we have to go back to the facility for which patients come from and they can request to transition once back to their.  We also reviewed that discharge is led by the primary hospitalist team.  I did allow Jasani time and grace to discuss his concerns associated with [facility] predominantly the where he is seems to be associated with the care he receives there.  Rudell is under hospice services through [facility] in the setting of his end-stage COPD.  This is a service for which he agrees to continue.  We discussed Tyheim's mortality and the fears he has associated with falling asleep as  he worries he may not wake up or that he may choke due to his swallowing difficulties.  Provided therapeutic support through reflective listening.  From a symptomology perspective Rubel notes ongoing shortness of breath the morphine  does help if received consistently.  We reviewed continuing this modality of care.  Davie shares inconsistency with how he is receiving  the morphine .  Quitman notes that he does have a hard time eating and a lot of this is associated with how short of breath he gets.  We reviewed the importance of small frequent meals.  Draken does agree to start an appetite stimulant-Marinol.  Maguire shares at times he gets nauseous though it comes and goes.  When asked if the medication he has helps he says it does not.  Will start Zofran  prior to meals.  This may adjunctively increase appetite.  Discussed the importance of continued conversation with family and their  medical providers regarding overall plan of care and treatment options, ensuring decisions are within the context of the patients values and GOCs.  Decision Maker: Junior (son) [phone] and [phone]  SUMMARY OF RECOMMENDATIONS   DNAR/DNI  Rafay is established with [facility] at [place] where he will transition back to an oncoming day(s)  Initiate Marinol 2.5 mg orally before lunch and dinner  Initiate Zofran  8 mg before breakfast lunch and dinner  Continue morphine  5 mg every 6 hours as needed for shortness of breath - will alert nurse to offer frequently. If by tomorrow no improvement will make standing dose in addition to PRN  Continue ativan  1mg  PO Q4H PRN and will add standing dose in the evening hour(s)  The palliative care medicine team will remain involved for ongoing support  Code Status/Advance Care Planning: DNAR/DNI  Palliative Prophylaxis:  Aspiration, Bowel Regimen, Delirium Protocol, Frequent Pain Assessment, Oral Care, Palliative Wound Care, and Turn Reposition  Additional Recommendations (Limitations, Scope, Preferences): Continue present care  Psycho-social/Spiritual:  Desire for further Chaplaincy support: Yes - Christian Additional Recommendations: Education on EOL   Prognosis: Limited overall. Hospice remains appropriate.   Discharge Planning: To [facility] once medically optimized.  Vitals:   11/12/23 1403 11/12/23 1603  BP:  (!)  135/90  Pulse:  93  Resp: 17 (!) 22  Temp:  98.3 F (36.8 C)  SpO2:  100%    Intake/Output Summary (Last 24 hours) at 11/12/2023 1627 Last data filed at 11/12/2023 0859 Gross per 24 hour  Intake 200 ml  Output 650 ml  Net -450 ml   Last Weight  Most recent update: 11/09/2023 10:40 PM    Weight  40.6 kg (89 lb 8.1 oz)            Gen:  Middle Age M chronically ill - cachectic  HEENT: moist mucous membranes CV: Regular rate and rhythm  PULM:  Tripod on 3LPM [place], pursed lip breathing ABD: soft/nontender  EXT: Muscle wasting all four extremities Neuro: Alert and oriented x3   PPS: 40%   This conversation/these recommendations were discussed with patient primary care team, Dr. Alfonse Angle ______________________________________________________ [facility] Palliative Medicine Team Team Cell Phone: [phone] Please utilize secure chat with additional questions, if there is no response within 30 minutes please call the above phone number  Billing based on MDM: High Time: 64  Palliative Medicine Team providers are available by phone from 7am to 7pm daily and can be reached through the team cell phone.  Should this patient require assistance outside of these hours, please call the patient's  attending physician.

## 2023-11-12 NOTE — Progress Notes (Signed)
 [facility] Liaison Note   Jaime Cline is a current hospice patient with [facility] with a terminal diagnosis of chronic obstructive pulmonary disease. Patient called 911 himself from his facility[name], stating he needed to go to the hospital as he felt he was dying. Facility staff offered Ativan  and Morphine , though patient refused. EMS transported patient to ED. Jaime Cline has been admitted to [facility] on 5.23.2025 for COPD exacerbation. Per Dr. Salome Credit with [facility], this is a related hospital admission.   Visited with patient at bedside. He reports that his doctor said discharge will likely be tomorrow and he is feeling anxious about returning to his LTC facility.    VS: 98.3/105/17   117/78   100% on 3 lpm  I&O: 600/950   Abnormal Labs: none new  Diagnostics: none new   IV/PRN Meds: Solumedrol 40 mg IV Q 12 hours, Rocephin  1 g IV Q 24 hours, Ativan  1 mg po X 4   Assessment/Plan (per Dr. Lea Primmer note 5.26.2025)   1. COPD exacerbation; chronic hypoxic respiratory failure  -Breathing has improved -Continue ceftriaxone , Solu-Medrol , DuoNebs every 6 hours, guaifenesin  300 mg every 6 hours, -Currently requiring 3 L/min of oxygen via nasal cannula     2. Hyponatremia  - Serum sodium 128 on admission;  --Serum sodium is 129 today   3. Anxiety   - Continue Zoloft  and as-needed Ativan    4.  Dysphagia - Swallow evaluation obtained, has chronic dysmotility - No intervention will help at this time.  Discharge Planning: return to facility with hospice when medically stable.   Family Contact: pt is his own spokesperson   IDT: updated   Goals of Care: Not clear due to patient's anxiety. Continued discussions regarding comfort vs aggressive care and returning to hospital.   Please call with any hospice related questions or concerns.   Please use [facility] for transport at time of discharge as [employer] with  them for our active hospice patients.   Thank you, Ardine Beckwith, [facility] Liaison [phone]

## 2023-11-12 NOTE — TOC Initial Note (Signed)
 Transition of Care [facility]) - Initial/Assessment Note    Patient Details  Name: Jaime Cline MRN: [id number] Date of Birth: 1960/12/05  Transition [facility] CM/SW Contact:    Delilah Fend, LCSW Phone Number: 11/12/2023, 12:40 PM  Clinical Narrative:                  Met with pt to review dc planning needs.  Pt very pleasant and confirming he was admitted here from [facility] where he is a LTC resident.  He fully anticipates returning there at dc.  Per MD, pt may be medically ready for dc tomorrow.  He is a Hospice Care patient as well and they are monitoring chart for resumption of services at [facility].  TOC will assist with transport back to facility when ready.  Expected Discharge Plan: Long Term Nursing Home Barriers to Discharge: Continued Medical Work up   Patient Goals and CMS Choice Patient states their goals for this hospitalization and ongoing recovery are:: return to SNF          Expected Discharge Plan and Services In-house Referral: Clinical Social Work   Post Acute Care Choice: Nursing Home Living arrangements for the past 2 months: Skilled Nursing Facility                 DME Arranged: N/A DME Agency: NA                  Prior Living Arrangements/Services Living arrangements for the past 2 months: Skilled Nursing Facility Lives with:: Facility Resident Patient language and need for interpreter reviewed:: Yes Do you feel safe going back to the place where you live?: Yes      Need for Family Participation in Patient Care: No (Comment) Care giver support system in place?: Yes (comment)   Criminal Activity/Legal Involvement Pertinent to Current Situation/Hospitalization: No - Comment as needed  Activities of Daily Living   ADL Screening (condition at time of admission) Independently performs ADLs?: No Does the patient have a NEW difficulty with bathing/dressing/toileting/self-feeding that is expected to last >3 days?: No Does the patient have a NEW  difficulty with getting in/out of bed, walking, or climbing stairs that is expected to last >3 days?: No Does the patient have a NEW difficulty with communication that is expected to last >3 days?: No Is the patient deaf or have difficulty hearing?: No Does the patient have difficulty seeing, even when wearing glasses/contacts?: No Does the patient have difficulty concentrating, remembering, or making decisions?: No  Permission Sought/Granted Permission sought to share information with : Facility Medical sales representative, Family Supports Permission granted to share information with : Yes, Verbal Permission Granted  Share Information with NAME: niece, Vonn Guard @ [phone]  Permission granted to share info w AGENCY: [name]        Emotional Assessment Appearance:: Appears older than stated age Attitude/Demeanor/Rapport: Gracious Affect (typically observed): Pleasant Orientation: : Oriented to Place, Oriented to  Time, Oriented to Situation, Oriented to Self Alcohol  / Substance Use: Not Applicable Psych Involvement: No (comment)  Admission diagnosis:  COPD exacerbation (HCC) [J44.1] COPD with acute exacerbation (HCC) [J44.1] Acute on chronic respiratory failure with hypoxia (HCC) [J96.21] Patient Active Problem List   Diagnosis Date Noted   Chronic respiratory failure with hypoxia (HCC) 11/09/2023   Anxiety 11/09/2023   Lactic acidosis 06/27/2023   Chest pain 06/27/2023   Alcohol  withdrawal (HCC) 06/27/2023   Substance abuse (HCC) 06/27/2023   Normocytic anemia 06/27/2023   AKI (acute  kidney injury) (HCC) 08/18/2022   Metabolic acidosis, increased anion gap 08/18/2022   Depression 08/18/2022   Dyspepsia 08/18/2022   Sepsis due to pneumonia (HCC) 08/17/2022   Dyspnea 04/12/2022   Palliative care encounter 04/11/2022   Goals of care, counseling/discussion 04/11/2022   Counseling and coordination of care 04/11/2022   COPD with acute exacerbation (HCC) 04/10/2022    Syncope 09/28/2021   Protein-calorie malnutrition, severe 09/27/2021   Hyponatremia    Hypotension due to hypovolemia    Syncope and collapse 09/26/2021   CAP (community acquired pneumonia)    COPD exacerbation (HCC) 08/27/2021   Asthma, chronic 12/31/2012   Smoker 12/31/2012   Knee pain, bilateral 12/31/2012   Iron deficiency anemia 12/31/2012   Knee pain, chronic 12/31/2012   PCP:  Patient, [name] Pharmacy:   [facility] [address] [place] [address] Phone: [phone] Fax: [phone]     Social Drivers of Health (SDOH) Social History: SDOH Screenings   Food Insecurity: No Food Insecurity (11/09/2023)  Housing: Low Risk  (11/09/2023)  Transportation Needs: No Transportation Needs (11/09/2023)  Utilities: Not At Risk (11/09/2023)  Social Connections: Unknown (06/27/2023)  Tobacco Use: Medium Risk (11/09/2023)   SDOH Interventions:     Readmission Risk Interventions    11/12/2023   12:38 PM 06/29/2023    9:51 AM 04/12/2022    4:02 PM  Readmission Risk Prevention Plan  Transportation Screening Complete Complete Complete  PCP or Specialist Appt within 5-7 Days   Complete  PCP or Specialist Appt within 3-5 Days  Not Complete   Not Complete comments  Patient is a LTC resident at [facility].   Home Care Screening   Complete  Medication Review (RN CM)   Complete  HRI or Home Care Consult  Complete   Social Work Consult for Recovery Care Planning/Counseling  Complete   Palliative Care Screening  Not Applicable   Medication Review Oceanographer) Complete Complete   PCP or Specialist appointment within 3-5 days of discharge Complete    HRI or Home Care Consult Complete    SW Recovery Care/Counseling Consult Complete    Palliative Care Screening Complete    Skilled Nursing Facility Complete

## 2023-11-12 NOTE — NC FL2 (Signed)
 [place]  MEDICAID FL2 LEVEL OF CARE FORM     IDENTIFICATION  Patient Name: Jaime Cline Birthdate: 07-25-1960 Sex: male Admission Date (Current Location): 11/09/2023  [place] and [place] Number:  Ernesto Heady [id number] N Facility and Address:  [facility],  [address], [place] [address]      Provider Number: [id number]  Attending Physician Name and Address:  Ozell Blunt, MD  Relative Name and Phone Number:  niece, Vonn Guard @ [phone]    Current Level of Care: Hospital Recommended Level of Care: Nursing Facility Prior Approval Number:    Date Approved/Denied:   PASRR Number: [id number]  Discharge Plan: SNF    Current Diagnoses: Patient Active Problem List   Diagnosis Date Noted   Chronic respiratory failure with hypoxia (HCC) 11/09/2023   Anxiety 11/09/2023   Lactic acidosis 06/27/2023   Chest pain 06/27/2023   Alcohol  withdrawal (HCC) 06/27/2023   Substance abuse (HCC) 06/27/2023   Normocytic anemia 06/27/2023   AKI (acute kidney injury) (HCC) 08/18/2022   Metabolic acidosis, increased anion gap 08/18/2022   Depression 08/18/2022   Dyspepsia 08/18/2022   Sepsis due to pneumonia (HCC) 08/17/2022   Dyspnea 04/12/2022   Palliative care encounter 04/11/2022   Goals of care, counseling/discussion 04/11/2022   Counseling and coordination of care 04/11/2022   COPD with acute exacerbation (HCC) 04/10/2022   Syncope 09/28/2021   Protein-calorie malnutrition, severe 09/27/2021   Hyponatremia    Hypotension due to hypovolemia    Syncope and collapse 09/26/2021   CAP (community acquired pneumonia)    COPD exacerbation (HCC) 08/27/2021   Asthma, chronic 12/31/2012   Smoker 12/31/2012   Knee pain, bilateral 12/31/2012   Iron deficiency anemia 12/31/2012   Knee pain, chronic 12/31/2012    Orientation RESPIRATION BLADDER Height & Weight     Situation, Self, Time, Place  O2 Continent Weight: 89 lb 8.1 oz (40.6 kg) Height:  5\' 10"  (177.8  cm)  BEHAVIORAL SYMPTOMS/MOOD NEUROLOGICAL BOWEL NUTRITION STATUS      Continent Diet (regular, thin liquids)  AMBULATORY STATUS COMMUNICATION OF NEEDS Skin   Extensive Assist Verbally Other (Comment) (sacral abrasion; foam)                       Personal Care Assistance Level of Assistance  Bathing, Dressing Bathing Assistance: Limited assistance   Dressing Assistance: Limited assistance     Functional Limitations Info  Sight, Hearing, Speech Sight Info: Adequate Hearing Info: Adequate Speech Info: Adequate    SPECIAL CARE FACTORS FREQUENCY  PT (By licensed PT)     PT Frequency: 2x/wk              Contractures Contractures Info: Not present    Additional Factors Info  Code Status Code Status Info: DNR             Current Medications (11/12/2023):  This is the current hospital active medication list Current Facility-Administered Medications  Medication Dose Route Frequency Provider Last Rate Last Admin   acetaminophen  (TYLENOL ) tablet 650 mg  650 mg Oral Q6H PRN Opyd, Timothy S, MD   650 mg at 11/10/23 2021   Or   acetaminophen  (TYLENOL ) suppository 650 mg  650 mg Rectal Q6H PRN Opyd, Timothy S, MD       bisacodyl (DULCOLAX) EC tablet 5 mg  5 mg Oral Daily PRN Opyd, Timothy S, MD       cefTRIAXone  (ROCEPHIN ) 1 g in sodium chloride  0.9 % 100 mL IVPB  1  g Intravenous Q24H Opyd, Timothy S, MD   Stopped at 11/11/23 2230   enoxaparin  (LOVENOX ) injection 30 mg  30 mg Subcutaneous Q24H Opyd, Timothy S, MD   30 mg at 11/11/23 2116   guaiFENesin  (ROBITUSSIN) 100 MG/5ML liquid 15 mL  15 mL Oral Q6H Ozell Blunt, MD   15 mL at 11/12/23 1157   ipratropium-albuterol  (DUONEB) 0.5-2.5 (3) MG/3ML nebulizer solution 3 mL  3 mL Nebulization Q2H PRN Opyd, Timothy S, MD   3 mL at 11/12/23 1154   LORazepam  (ATIVAN ) tablet 1 mg  1 mg Oral Q4H PRN Opyd, Timothy S, MD   1 mg at 11/12/23 9528   methylPREDNISolone  sodium succinate  (SOLU-MEDROL ) 40 mg/mL injection 40 mg  40 mg  Intravenous Q12H Lama, Gagan S, MD   40 mg at 11/12/23 4132   morphine  CONCENTRATE 10 mg / 0.5 ml oral solution 5 mg  5 mg Oral Q8H PRN Opyd, Timothy S, MD   5 mg at 11/10/23 2022   ondansetron  (ZOFRAN ) tablet 4 mg  4 mg Oral Q6H PRN Opyd, Timothy S, MD       Or   ondansetron  (ZOFRAN ) injection 4 mg  4 mg Intravenous Q6H PRN Opyd, Timothy S, MD   4 mg at 11/11/23 4401   Oral care mouth rinse  15 mL Mouth Rinse PRN Ozell Blunt, MD       polyethylene glycol (MIRALAX / GLYCOLAX) packet 17 g  17 g Oral Daily PRN Opyd, Timothy S, MD       sertraline  (ZOLOFT ) tablet 25 mg  25 mg Oral Daily Opyd, Timothy S, MD   25 mg at 11/12/23 0856   sodium chloride  flush (NS) 0.9 % injection 3 mL  3 mL Intravenous Q12H Opyd, Timothy S, MD   3 mL at 11/12/23 1052     Discharge Medications: Please see discharge summary for a list of discharge medications.  Relevant Imaging Results:  Relevant Lab Results:   Additional Information SSN: [id number]  Delilah Fend, LCSW

## 2023-11-12 NOTE — Progress Notes (Signed)
   11/12/23 1149  Assess: MEWS Score  Temp 98.3 F (36.8 C)  BP 117/78  MAP (mmHg) 91  Pulse Rate (!) 105  Resp (!) 21  SpO2 100 %  O2 Device Nasal Cannula  O2 Flow Rate (L/min) 3 L/min  Assess: MEWS Score  MEWS Temp 0  MEWS Systolic 0  MEWS Pulse 1  MEWS RR 1  MEWS LOC 0  MEWS Score 2  MEWS Score Color Yellow  Assess: if the MEWS score is Yellow or Red  Were vital signs accurate and taken at a resting state? Yes  Does the patient meet 2 or more of the SIRS criteria? Yes  MEWS guidelines implemented  Yes, yellow  Treat  MEWS Interventions Considered administering scheduled or prn medications/treatments as ordered  Take Vital Signs  Increase Vital Sign Frequency  Yellow: Q2hr x1, continue Q4hrs until patient remains green for 12hrs  Escalate  MEWS: Escalate Yellow: Discuss with charge nurse and consider notifying provider and/or RRT  Notify: Charge Nurse/RN  Name of Charge Nurse/RN Notified [facility]  Provider Notification  Provider Name/Title Daivd Dub  Date Provider Notified 11/12/23  Time Provider Notified 1200  Method of Notification Page  Notification Reason Change in status  Provider response No new orders  Date of Provider Response 11/12/23  Time of Provider Response 1212  Assess: SIRS CRITERIA  SIRS Temperature  0  SIRS Respirations  1  SIRS Pulse 1  SIRS WBC 0  SIRS Score Sum  2

## 2023-11-12 NOTE — Progress Notes (Signed)
 [facility]  PROGRESS NOTE  ARIEN BENINCASA WUJ:[id number] DOB: [birth year] DOA: 11/09/2023 PCP: Patient, [name]   Brief HPI:    63 y.o. 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   Patient reports 1 week of worsening dyspnea.  He has also had increase in his chronic cough.  He improves briefly with breathing treatments but soon worsens again.  There is no leg swelling, leg tenderness, or hemoptysis.  He denies any recent alcohol  or illicit substance use.      Assessment/Plan:   1. COPD exacerbation; chronic hypoxic respiratory failure  -Breathing has improved -Continue ceftriaxone , Solu-Medrol , DuoNebs every 6 hours, guaifenesin  300 mg every 6 hours, -Currently requiring 3 L/min of oxygen via nasal cannula   2. Hyponatremia  - Serum sodium 128 on admission;  --Serum sodium is 129 today   3. Anxiety   - Continue Zoloft  and as-needed Ativan   4.  Dysphagia - Swallow evaluation obtained, has chronic dysmotility - No intervention will help at this time.  Medications     enoxaparin  (LOVENOX ) injection  30 mg Subcutaneous Q24H   guaiFENesin   15 mL Oral Q6H   ipratropium-albuterol   3 mL Nebulization Q6H   methylPREDNISolone  (SOLU-MEDROL ) injection  40 mg Intravenous Q12H   sertraline   25 mg Oral Daily   sodium chloride  flush  3 mL Intravenous Q12H     Data Reviewed:   CBG:  Recent Labs  Lab 11/10/23 1324  GLUCAP 131*    SpO2: 98 % O2 Flow Rate (L/min): 2 L/min FiO2 (%): 32 %    Vitals:   11/11/23 1928 11/11/23 2037 11/12/23 0555 11/12/23 0810  BP:  110/80 125/86   Pulse:  (!) 104 90   Resp:  20 16   Temp:  98.4 F (36.9 C) 98 F (36.7 C)   TempSrc:      SpO2: 100% 100% 100% 98%  Weight:      Height:          Data Reviewed:  Basic Metabolic Panel: Recent Labs  Lab 11/05/23 0930  11/09/23 1936 11/10/23 0639 11/11/23 0600  NA 129* 128* 130* 129*  K 3.9 3.8 4.3 4.4  CL 84* 83* 85* 88*  CO2 35* 31 34* 28  GLUCOSE 98 98 127* 114*  BUN 11 12 13 21   CREATININE 0.60* 0.55* 0.59* 0.57*  CALCIUM  9.1 9.2 9.6 9.5    CBC: Recent Labs  Lab 11/05/23 0930 11/09/23 1936 11/10/23 0639 11/11/23 0600  WBC 4.7 9.5 5.5 8.4  NEUTROABS  --  7.9*  --   --   HGB 10.5* 10.0* 11.1* 10.7*  HCT 34.5* 31.9* 35.3* 34.3*  MCV 95.6 96.1 95.4 97.4  PLT 335 313 368 417*    LFT Recent Labs  Lab 11/09/23 1936  AST 18  ALT 10  ALKPHOS 53  BILITOT 0.7  PROT 7.3  ALBUMIN 4.0     Antibiotics: Anti-infectives (From admission, onward)    Start     Dose/Rate Route Frequency Ordered Stop   11/09/23 2115  cefTRIAXone  (ROCEPHIN ) 1 g in sodium chloride  0.9 % 100 mL IVPB        1 g 200 mL/hr over 30 Minutes Intravenous Every 24 hours 11/09/23 2110 11/14/23 2114        DVT prophylaxis: Lovenox   Code Status: DNR  Family Communication:    CONSULTS    Subjective  Feels fatigued, low appetite.  Breathing is improved.   Objective    Physical Examination:  General-appears in no acute distress Heart-S1-S2, regular, no murmur auscultated Lungs-decreased breath sounds at lung bases Abdomen-soft, nontender, no organomegaly Extremities-no edema in the lower extremities Neuro-alert, oriented x3, no focal deficit noted   Status is: Inpatient:             Ozell Blunt   [facility] If 7PM-7AM, please contact night-coverage at [email or web address], Office  [phone]   11/12/2023, 8:28 AM  LOS: 2 days

## 2023-11-13 ENCOUNTER — Other Ambulatory Visit (HOSPITAL_COMMUNITY): Payer: Self-pay

## 2023-11-13 DIAGNOSIS — R4589 Other symptoms and signs involving emotional state: Secondary | ICD-10-CM

## 2023-11-13 DIAGNOSIS — Z515 Encounter for palliative care: Secondary | ICD-10-CM | POA: Diagnosis not present

## 2023-11-13 DIAGNOSIS — Z7189 Other specified counseling: Secondary | ICD-10-CM | POA: Diagnosis not present

## 2023-11-13 DIAGNOSIS — J441 Chronic obstructive pulmonary disease with (acute) exacerbation: Secondary | ICD-10-CM | POA: Diagnosis not present

## 2023-11-13 DIAGNOSIS — J9611 Chronic respiratory failure with hypoxia: Secondary | ICD-10-CM | POA: Diagnosis not present

## 2023-11-13 MED ORDER — PREDNISONE 10 MG PO TABS: ORAL_TABLET | ORAL | Status: AC

## 2023-11-13 MED ORDER — DRONABINOL 2.5 MG PO CAPS: 2.5000 mg | ORAL_CAPSULE | Freq: Two times a day (BID) | ORAL | 0 refills | Status: AC

## 2023-11-13 MED ORDER — GUAIFENESIN 100 MG/5ML PO LIQD
15.0000 mL | Freq: Four times a day (QID) | ORAL | 0 refills | Status: AC | PRN
  Filled 2023-11-13: qty 120, 2d supply, fill #0

## 2023-11-13 MED ORDER — LORAZEPAM 0.5 MG PO TABS: 0.5000 mg | ORAL_TABLET | ORAL | 0 refills | Status: AC | PRN

## 2023-11-13 MED ORDER — ONDANSETRON HCL 8 MG PO TABS: 8.0000 mg | ORAL_TABLET | Freq: Three times a day (TID) | ORAL | Status: AC

## 2023-11-13 MED ORDER — MELATONIN 5 MG PO TABS
5.0000 mg | ORAL_TABLET | Freq: Once | ORAL | Status: AC
  Administered 2023-11-13: 5 mg via ORAL
  Filled 2023-11-13: qty 1

## 2023-11-13 MED ORDER — MORPHINE SULFATE (CONCENTRATE) 20 MG/ML PO SOLN: 5.0000 mg | Freq: Three times a day (TID) | ORAL | 0 refills | Status: AC | PRN

## 2023-11-13 NOTE — Discharge Summary (Signed)
 Physician Discharge Summary   Patient: Jaime Cline MRN: [id number] DOB: 07-Feb-1961  Admit date:     11/09/2023  Discharge date: 11/13/23  Discharge Physician: Ozell Blunt   PCP: Patient, [name]   Recommendations at discharge:   Patient to go to skilled nursing facility with hospice follow-up  Discharge Diagnoses: Principal Problem:   COPD with acute exacerbation (HCC) Active Problems:   COPD exacerbation (HCC)   Hyponatremia   Chronic respiratory failure with hypoxia (HCC)   Anxiety  Resolved Problems:   * No resolved hospital problems. *  Hospital Course:   63 y.o. 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   Patient reports 1 week of worsening dyspnea.  He has also had increase in his chronic cough.  He improves briefly with breathing treatments but soon worsens again.  There is no leg swelling, leg tenderness, or hemoptysis.  He denies any recent alcohol  or illicit substance use.  Home estimated  Assessment and Plan:  1. COPD exacerbation; chronic hypoxic respiratory failure  -Breathing has improved -Continue ceftriaxone , Solu-Medrol , DuoNebs every 6 hours, guaifenesin  300 mg every 6 hours, -Currently requiring 3 L/min of oxygen via nasal cannula -Will discharge on prednisone  taper     2. Hyponatremia  - Serum sodium 128 on admission;  --Serum sodium is 129 today   3. Anxiety   - Continue Zoloft  and as-needed Ativan    4.  Dysphagia - Swallow evaluation obtained, has chronic dysmotility - No intervention will help at this time. - Discharged on Marinol, Zofran    Patient to be discharged to skilled nursing facility with hospice follow-up       Consultants: Palliative care Procedures performed:  Disposition: Skilled nursing facility Diet recommendation:  Discharge Diet Orders (From admission,  onward)     Start     Ordered   11/13/23 0000  Diet - low sodium heart healthy        11/13/23 1116            DISCHARGE MEDICATION: Allergies as of 11/13/2023       Reactions   Other Other (See Comments)   BP med from [facility] about 3 years ago, caused some seizures, due to getting wrong BP med at that time.  Unknown name of med.  Allergy not listed on MAR         Medication List     TAKE these medications    acetaminophen  500 MG tablet Commonly known as: TYLENOL  Take 500 mg by mouth in the morning, at noon, and at bedtime.   albuterol  108 (90 Base) MCG/ACT inhaler Commonly known as: VENTOLIN  HFA Inhale 1-2 puffs into the lungs every 6 (six) hours as needed for wheezing.   Arctic Relief Pain Relieving 0.2-3.5 % Gel Generic drug: Camphor-Menthol  Apply 1 Application topically in the morning and at bedtime.   aspirin  EC 81 MG tablet Take 1 tablet (81 mg total) by mouth daily. Swallow whole.   Baclofen 5 MG Tabs Take 1 tablet by mouth at bedtime.   barrier cream Crea Commonly known as: non-specified Apply 1 Application topically See admin instructions. 1 application to peri area every shift with all incontinence care   dronabinol 2.5 MG capsule Commonly known as: MARINOL Take 1 capsule (2.5 mg total) by mouth 2 (two) times daily before lunch and supper.   folic acid  1 MG tablet Commonly known  as: FOLVITE  Take 1 tablet (1 mg total) by mouth daily.   gabapentin  300 MG capsule Commonly known as: NEURONTIN  Take 2 capsules (600 mg total) by mouth 3 (three) times daily. What changed: how much to take   guaiFENesin  100 MG/5ML liquid Commonly known as: ROBITUSSIN Take 15 mLs by mouth every 6 (six) hours as needed for cough or to loosen phlegm.   ipratropium-albuterol  0.5-2.5 (3) MG/3ML Soln Commonly known as: DUONEB Take 3 ml by nebulizer once daily for 5 days starting 06/26/23, then 3 ml by nebulizer twice daily What changed: when to take this   loperamide   2 MG tablet Commonly known as: IMODIUM  A-D Take 2 mg by mouth every 6 (six) hours as needed for diarrhea or loose stools.   LORazepam  0.5 MG tablet Commonly known as: ATIVAN  Take 1 tablet (0.5 mg total) by mouth every 4 (four) hours as needed for anxiety. What changed: Another medication with the same name was removed. Continue taking this medication, and follow the directions you see here.   morphine  20 MG/ML concentrated solution Commonly known as: ROXANOL Take 0.25 mLs (5 mg total) by mouth every 8 (eight) hours as needed for shortness of breath or anxiety. What changed:  reasons to take this Another medication with the same name was removed. Continue taking this medication, and follow the directions you see here.   nicotine  14 mg/24hr patch Commonly known as: NICODERM CQ  - dosed in mg/24 hours Place 1 patch (14 mg total) onto the skin daily.   ondansetron  8 MG tablet Commonly known as: ZOFRAN  Take 1 tablet (8 mg total) by mouth 3 (three) times daily before meals.   OXYGEN Inhale 2 L into the lungs continuous.   predniSONE  10 MG tablet Commonly known as: DELTASONE  Take 10 mg by mouth every morning. What changed: Another medication with the same name was added. Make sure you understand how and when to take each.   predniSONE  10 MG tablet Commonly known as: DELTASONE  Prednisone  40 mg po daily x 1 day then Prednisone  30 mg po daily x 1 day then Prednisone  20 mg po daily x 1 day then Continue prednisone  10 mg daily What changed: You were already taking a medication with the same name, and this prescription was added. Make sure you understand how and when to take each.   Senna-S 8.6-50 MG tablet Generic drug: senna-docusate Take 1 tablet by mouth daily.   sertraline  25 MG tablet Commonly known as: ZOLOFT  Take 25 mg by mouth in the morning and at bedtime.   traZODone  50 MG tablet Commonly known as: DESYREL  Take 1 tablet (50 mg total) by mouth at bedtime as needed for  sleep. What changed: when to take this        Discharge Exam: Filed Weights   11/09/23 1850 11/09/23 2202  Weight: 40.4 kg 40.6 kg   Appears in no acute distress S1-S2, regular Abdomen is soft, nontender  Condition at discharge: good  The results of significant diagnostics from this hospitalization (including imaging, microbiology, ancillary and laboratory) are listed below for reference.   Imaging Studies: DG Chest Port 1 View Result Date: 11/09/2023 CLINICAL DATA:  Shortness of breath. EXAM: PORTABLE CHEST 1 VIEW COMPARISON:  X-ray 11/05/2023 and older. FINDINGS: Hyperinflation. Film is rotated to the right and there is some kyphosis obscuring the right lung apex. The extreme inferior costophrenic angles are clipped off the edge of the film. No consolidation, pneumothorax or effusion. No edema. Normal cardiopericardial silhouette. Overlapping cardiac  leads. IMPRESSION: Hyperinflation.  No acute cardiopulmonary disease.  Limited x-ray. Electronically Signed   By: Adrianna Horde M.D.   On: 11/09/2023 20:02   DG Chest Portable 1 View Result Date: 11/05/2023 CLINICAL DATA:  Shortness of breath. EXAM: PORTABLE CHEST - 1 VIEW COMPARISON:  08/23/2023 FINDINGS: Cardiomediastinal silhouette and pulmonary vasculature are within normal limits. Markedly hyperexpanded lungs which are otherwise clear. IMPRESSION: 1. No acute cardiopulmonary process. 2. COPD. Electronically Signed   By: Elester Grim M.D.   On: 11/05/2023 10:09   DG Chest 2 View Result Date: 10/23/2023 CLINICAL DATA:  Chest pain EXAM: CHEST - 2 VIEW COMPARISON:  October 08, 2023 FINDINGS: The heart size and mediastinal contours are within normal limits. Both lungs are clear. The visualized skeletal structures are unremarkable. IMPRESSION: No active cardiopulmonary disease.  COPD Electronically Signed   By: Fredrich Jefferson M.D.   On: 10/23/2023 14:57    Microbiology: Results for orders placed or performed during the hospital encounter of  10/08/23  Resp panel by RT-PCR (RSV, Flu A&B, Covid) Anterior Nasal Swab     Status: None   Collection Time: 10/08/23 10:39 PM   Specimen: Anterior Nasal Swab  Result Value Ref Range Status   SARS Coronavirus 2 by RT PCR NEGATIVE NEGATIVE Final    Comment: (NOTE) SARS-CoV-2 target nucleic acids are NOT DETECTED.  The SARS-CoV-2 RNA is generally detectable in upper respiratory specimens during the acute phase of infection. The lowest concentration of SARS-CoV-2 viral copies this assay can detect is 138 copies/mL. A negative result does not preclude SARS-Cov-2 infection and should not be used as the sole basis for treatment or other patient management decisions. A negative result may occur with  improper specimen collection/handling, submission of specimen other than nasopharyngeal swab, presence of viral mutation(s) within the areas targeted by this assay, and inadequate number of viral copies(<138 copies/mL). A negative result must be combined with clinical observations, patient history, and epidemiological information. The expected result is Negative.  Fact Sheet for Patients:  BloggerCourse.com  Fact Sheet for Healthcare Providers:  SeriousBroker.it  This test is no t yet approved or cleared by the [place]  FDA and  has been authorized for detection and/or diagnosis of SARS-CoV-2 by FDA under an Emergency Use Authorization (EUA). This EUA will remain  in effect (meaning this test can be used) for the duration of the COVID-19 declaration under Section 564(b)(1) of the Act, 21 U.S.C.section 360bbb-3(b)(1), unless the authorization is terminated  or revoked sooner.       Influenza A by PCR NEGATIVE NEGATIVE Final   Influenza B by PCR NEGATIVE NEGATIVE Final    Comment: (NOTE) The Xpert Xpress SARS-CoV-2/FLU/RSV plus assay is intended as an aid in the diagnosis of influenza from Nasopharyngeal swab specimens and should not  be used as a sole basis for treatment. Nasal washings and aspirates are unacceptable for Xpert Xpress SARS-CoV-2/FLU/RSV testing.  Fact Sheet for Patients: BloggerCourse.com  Fact Sheet for Healthcare Providers: SeriousBroker.it  This test is not yet approved or cleared by the [place]  FDA and has been authorized for detection and/or diagnosis of SARS-CoV-2 by FDA under an Emergency Use Authorization (EUA). This EUA will remain in effect (meaning this test can be used) for the duration of the COVID-19 declaration under Section 564(b)(1) of the Act, 21 U.S.C. section 360bbb-3(b)(1), unless the authorization is terminated or revoked.     Resp Syncytial Virus by PCR NEGATIVE NEGATIVE Final    Comment: (NOTE) Fact Sheet for Patients:  BloggerCourse.com  Fact Sheet for Healthcare Providers: SeriousBroker.it  This test is not yet approved or cleared by the [place]  FDA and has been authorized for detection and/or diagnosis of SARS-CoV-2 by FDA under an Emergency Use Authorization (EUA). This EUA will remain in effect (meaning this test can be used) for the duration of the COVID-19 declaration under Section 564(b)(1) of the Act, 21 U.S.C. section 360bbb-3(b)(1), unless the authorization is terminated or revoked.  Performed at [facility], [address], [place], [place] [address]     Labs: CBC: Recent Labs  Lab 11/09/23 1936 11/10/23 0639 11/11/23 0600  WBC 9.5 5.5 8.4  NEUTROABS 7.9*  --   --   HGB 10.0* 11.1* 10.7*  HCT 31.9* 35.3* 34.3*  MCV 96.1 95.4 97.4  PLT 313 368 417*   Basic Metabolic Panel: Recent Labs  Lab 11/09/23 1936 11/10/23 0639 11/11/23 0600  NA 128* 130* 129*  K 3.8 4.3 4.4  CL 83* 85* 88*  CO2 31 34* 28  GLUCOSE 98 127* 114*  BUN 12 13 21   CREATININE 0.55* 0.59* 0.57*  CALCIUM  9.2 9.6 9.5   Liver Function  Tests: Recent Labs  Lab 11/09/23 1936  AST 18  ALT 10  ALKPHOS 53  BILITOT 0.7  PROT 7.3  ALBUMIN 4.0   CBG: Recent Labs  Lab 11/10/23 1324  GLUCAP 131*    Discharge time spent: greater than 30 minutes.  Signed: Ozell Blunt, MD [facility] 11/13/2023

## 2023-11-13 NOTE — Progress Notes (Signed)
 Palliative Medicine Inpatient Follow Up Note HPI: 63 y.o. 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  Being treated for a COPD exacerbation.   Today's Discussion 11/13/2023  *Please note that this is a verbal dictation therefore any spelling or grammatical errors are due to the "Dragon Medical One" system interpretation.  Chart reviewed inclusive of vital signs, progress notes, laboratory results, and diagnostic images.   I met with Marietta Shorter at bedside this morning. He is awake and alert. He and I discussed his present symptoms which he does share he feels are improved. Izayiah notes that he was able to tolerate all of his breakfast. He did sleep fairly well. He denies nausea at this time.   He had expressed to nursing the desire to go to [facility]. We reviewed that right now his symptom needs are under better control and it does not appear that he requires thi level of support. Marietta Shorter and I reviewed that moving forward it would be very reasonable for him to transition to [facility] should his symptom needs increase beyond the point of SNF's ability or should he be encroaching his final days of life.   Created space and opportunity for patient to explore thoughts feelings and fears regarding current medical situation. He notes understanding of the above. He states how hard it has been for him in regards to his declining health. We reviewed his sources of strength inclusive of his son(s) involvement in his life.  Plan for Quientin to transition back to [place] this afternoon with [facility] support.   Questions and concerns addressed/Palliative Support Provided.   Objective Assessment: Vital Signs Vitals:   11/13/23 0350 11/13/23 1136  BP: (!) 127/90 118/84  Pulse: 94 90  Resp: 20 20  Temp: 97.8 F (36.6 C) 98.3 F (36.8 C)   SpO2: 100% 100%    Intake/Output Summary (Last 24 hours) at 11/13/2023 1227 Last data filed at 11/13/2023 1048 Gross per 24 hour  Intake 340 ml  Output 700 ml  Net -360 ml   Last Weight  Most recent update: 11/09/2023 10:40 PM    Weight  40.6 kg (89 lb 8.1 oz)            Gen:  Middle Age M chronically ill - cachectic  HEENT: moist mucous membranes CV: Regular rate and rhythm  PULM: On 3LPM [place], normal chest risk and fall ABD: soft/nontender  EXT: Muscle wasting all four extremities, clubbing in fingers Neuro: Alert and oriented x3   SUMMARY OF RECOMMENDATIONS   DNAR/DNI   Lunden is established with [facility] where he will transition back to    Marinol 2.5 mg orally before lunch and dinner   Zofran  8 mg before breakfast lunch and dinner   Continue morphine  5 mg every 6 hours as needed for shortness of breath   Continue ativan  1mg  PO Q4H PRN and standing dose in the evening hour(s)   The palliative care medicine team will remain involved for ongoing support ______________________________________________________________________________________ [facility] Palliative Medicine Team Team Cell Phone: [phone] Please utilize secure chat with additional questions, if there is no response within 30 minutes please call the above phone number  Time Spent: 35  Palliative Medicine Team providers are available by phone from 7am to 7pm daily and can be reached through the team cell phone.  Should this patient require assistance  outside of these hours, please call the patient's attending physician.

## 2023-11-13 NOTE — Progress Notes (Signed)
 Ist attempt is made to give report to [facility] .

## 2023-11-13 NOTE — Plan of Care (Signed)
  Problem: Education: Goal: Knowledge of General Education information will improve Description: Including pain rating scale, medication(s)/side effects and non-pharmacologic comfort measures Outcome: Progressing   Problem: Health Behavior/Discharge Planning: Goal: Ability to manage health-related needs will improve Outcome: Progressing   Problem: Clinical Measurements: Goal: Ability to maintain clinical measurements within normal limits will improve Outcome: Progressing Goal: Diagnostic test results will improve Outcome: Progressing Goal: Respiratory complications will improve Outcome: Progressing   Problem: Activity: Goal: Risk for activity intolerance will decrease Outcome: Progressing   Problem: Nutrition: Goal: Adequate nutrition will be maintained Outcome: Progressing   Problem: Coping: Goal: Level of anxiety will decrease Outcome: Progressing   Problem: Education: Goal: Knowledge of the prescribed therapeutic regimen will improve Outcome: Progressing   Problem: Respiratory: Goal: Ability to maintain a clear airway will improve Outcome: Progressing Goal: Levels of oxygenation will improve Outcome: Progressing

## 2023-11-13 NOTE — TOC Transition Note (Signed)
 Transition of Care [facility]) - Discharge Note   Patient Details  Name: Jaime Cline MRN: [id number] Date of Birth: January 19, 1961  [facility] CM/SW Contact:  Marty Sleet, LCSW Phone Number: 11/13/2023, 12:42 PM   Clinical Narrative:    Pt to return to [facility] w/ continued hospice services through [facility]. Met with pt and confirmed discharge plans. Pt will be going to room 318. RN to call report to [phone]. DC packet with signed DNR and scripts placed at RN station. [facility] called at 12:24pm for transportation.    Final next level of care: Long Term Nursing Home (w/ hospice) Barriers to Discharge: Barriers Resolved   Patient Goals and CMS Choice Patient states their goals for this hospitalization and ongoing recovery are:: return to SNF CMS Medicare.gov Compare Post Acute Care list provided to:: Patient Choice offered to / list presented to : Patient      Discharge Placement   Existing PASRR number confirmed : 11/12/23          Patient chooses bed at: [facility] Patient to be transferred to facility by: [facility] Name of family member notified: Patient Patient and family notified of of transfer: 11/13/23  Discharge Plan and Services Additional resources added to the After Visit Summary for   In-house Referral: Clinical Social Work   Post Acute Care Choice: Nursing Home          DME Arranged: N/A DME Agency: NA                  Social Drivers of Health (SDOH) Interventions SDOH Screenings   Food Insecurity: No Food Insecurity (11/09/2023)  Housing: Low Risk  (11/09/2023)  Transportation Needs: No Transportation Needs (11/09/2023)  Utilities: Not At Risk (11/09/2023)  Social Connections: Unknown (06/27/2023)  Tobacco Use: Medium Risk (11/09/2023)     Readmission Risk Interventions    11/13/2023   12:40 PM 11/12/2023   12:38 PM 06/29/2023    9:51 AM  Readmission Risk Prevention Plan  Transportation Screening Complete Complete Complete   PCP or Specialist Appt within 3-5 Days   Not Complete  Not Complete comments   Patient is a LTC resident at [facility].  HRI or Home Care Consult   Complete  Social Work Consult for Recovery Care Planning/Counseling   Complete  Palliative Care Screening   Not Applicable  Medication Review Oceanographer) Complete Complete Complete  PCP or Specialist appointment within 3-5 days of discharge  Complete   HRI or Home Care Consult  Complete   SW Recovery Care/Counseling Consult  Complete   Palliative Care Screening  Complete   Skilled Nursing Facility  Complete

## 2024-02-04 IMAGING — RF DG ESOPHAGUS
14 of 19 series · 16 of 24 positions shown · non-contrast
Comparison: NONE.

CLINICAL DATA: Patient complains of dysphagia, scratchy feeling in
throat with eating and feeling as food is getting stuck in throat
and chest after swallowing liquids and food.

EXAM:
ESOPHAGUS/BARIUM SWALLOW/TABLET STUDY
TECHNIQUE: Combined double and single contrast examination was performed using
effervescent crystals, high-density barium, and thin liquid barium.
This exam was performed by [employer] Norotomo, NP, and was supervised and
interpreted by Tovi, Dastager.
FLUOROSCOPY:
Radiation Exposure Index (as provided by the fluoroscopic device):
22.50 mGy Kerma

[Series 1: cp_standard · 0.35mm/px · 1 of 71 frames shown (1 of 14)]
[frame 11/71]
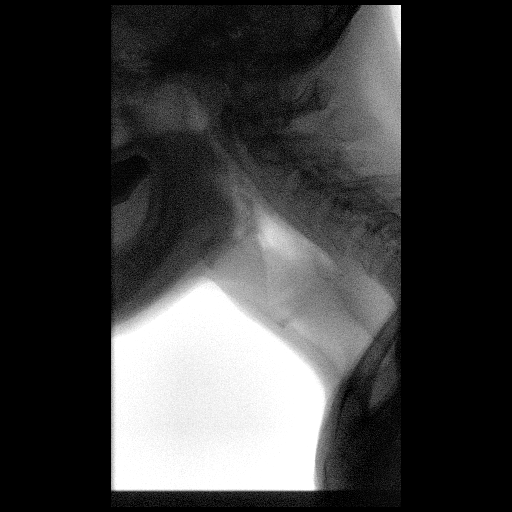

[Series 2: cp_standard · 0.35mm/px · 1 of 122 frames shown (2 of 14)]
[frame 62/122]
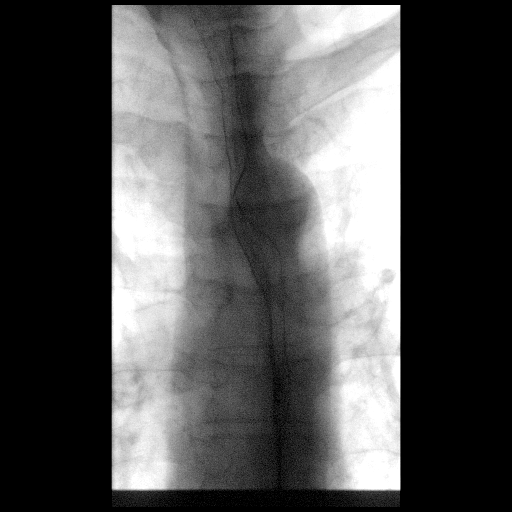

[Series 3: cp_standard · 0.35mm/px · 1 of 279 frames shown (3 of 14)]
[frame 69/279]
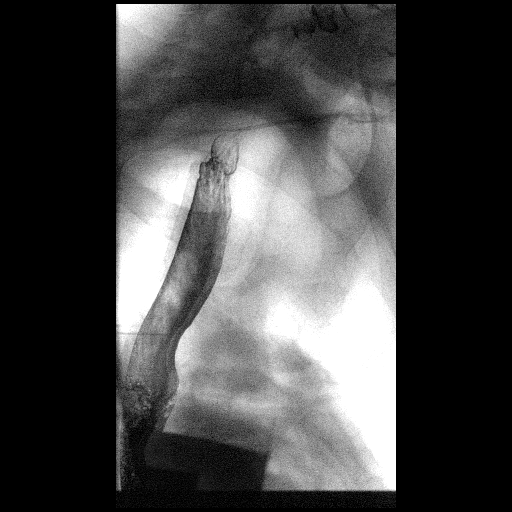

[Series 5: cp_standard · 0.34mm/px · 2 of 179 frames shown (4 of 14)]
[frame 27/179]
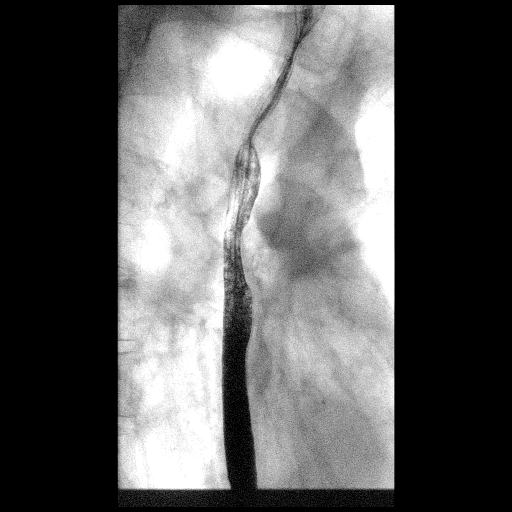
[frame 153/179]
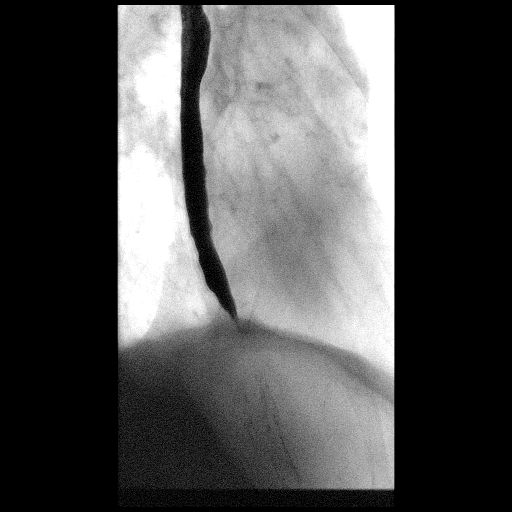

[Series 7: cp_standard · 0.34mm/px · 1 of 92 frames shown (5 of 14)]
[frame 47/92]
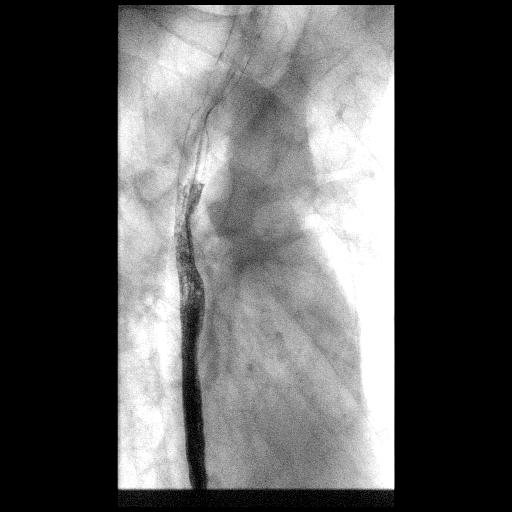

[Series 8: cp_standard · 0.34mm/px · 1 of 114 frames shown (6 of 14)]
[frame 18/114]
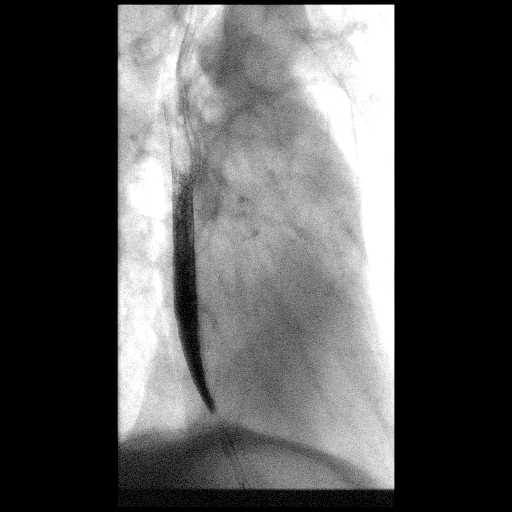

[Series 9: cp_standard · 0.36mm/px · 1 of 177 frames shown (7 of 14)]
[frame 89/177]
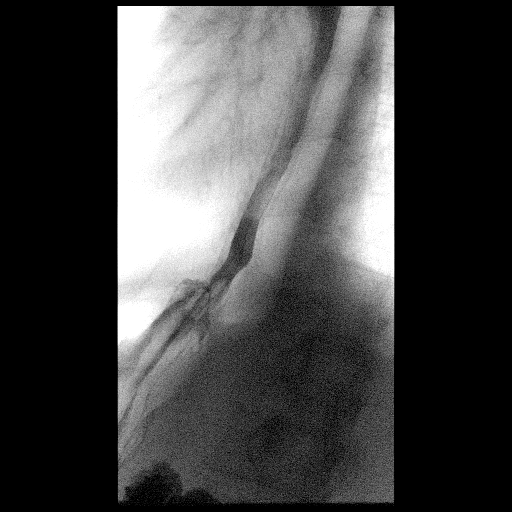

[Series 10: cp_standard · 0.36mm/px · 1 of 151 frames shown (8 of 14)]
[frame 129/151]
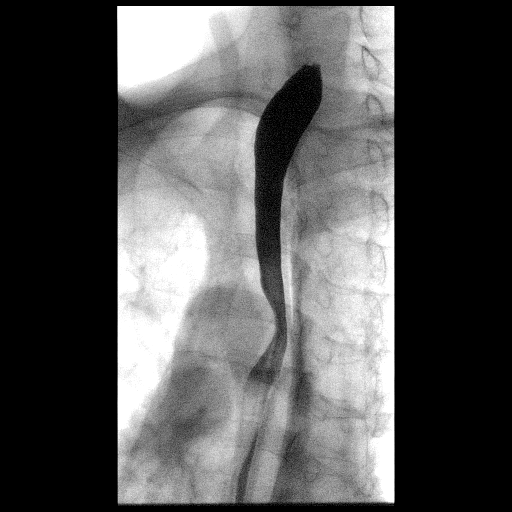

[Series 12: cp_standard · 0.36mm/px · 2 of 240 frames shown (9 of 14)]
[frame 37/240]
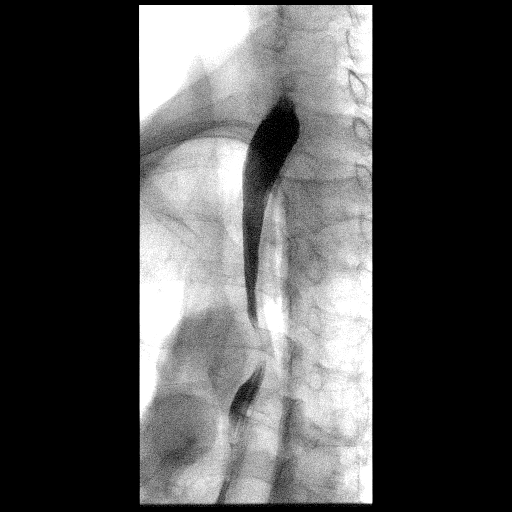
[frame 240/240]
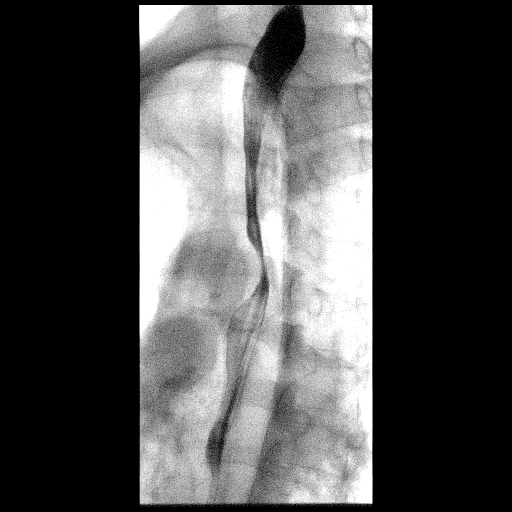

[Series 14: cp_standard · 0.37mm/px · 1 of 241 frames shown (10 of 14)]
[frame 37/241]
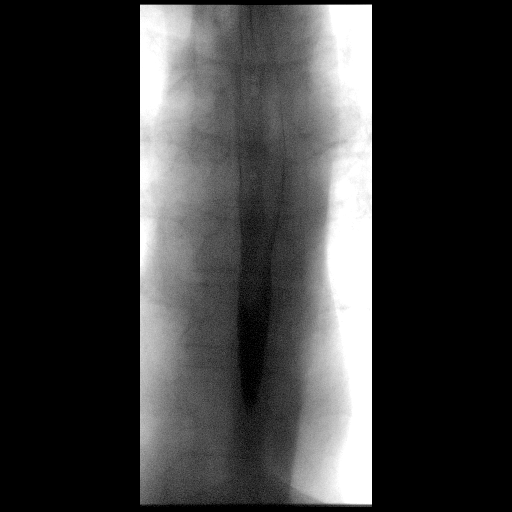

[Series 15: cp_standard · 0.37mm/px · 1 of 99 frames shown (11 of 14)]
[frame 15/99]
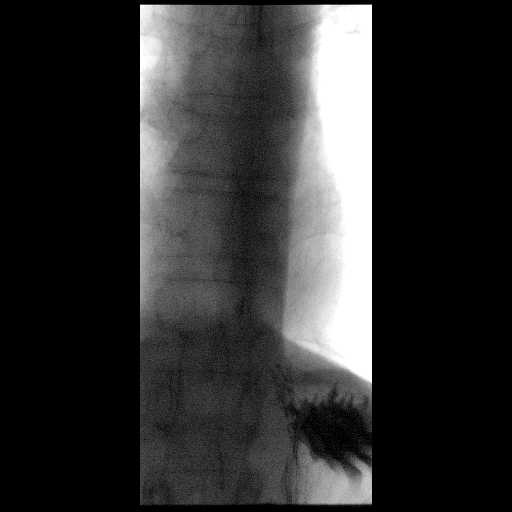

[Series 16: cp_standard · 0.37mm/px · 1 of 194 frames shown (12 of 14)]
[frame 191/194]
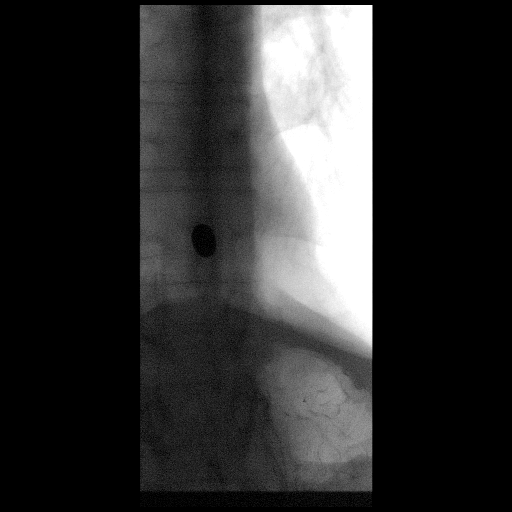

[Series 17: cp_standard · 0.37mm/px · 1 of 146 frames shown (13 of 14)]
[frame 125/146]
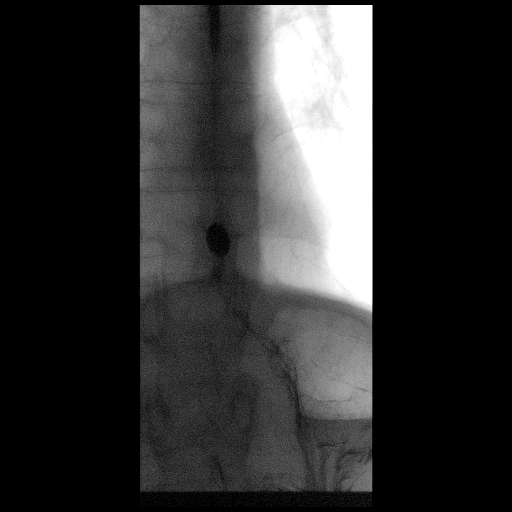

[Series 19: cp_standard · 0.18mm/px · 1 of 1 slices shown (14 of 14)]
[im 1/1]
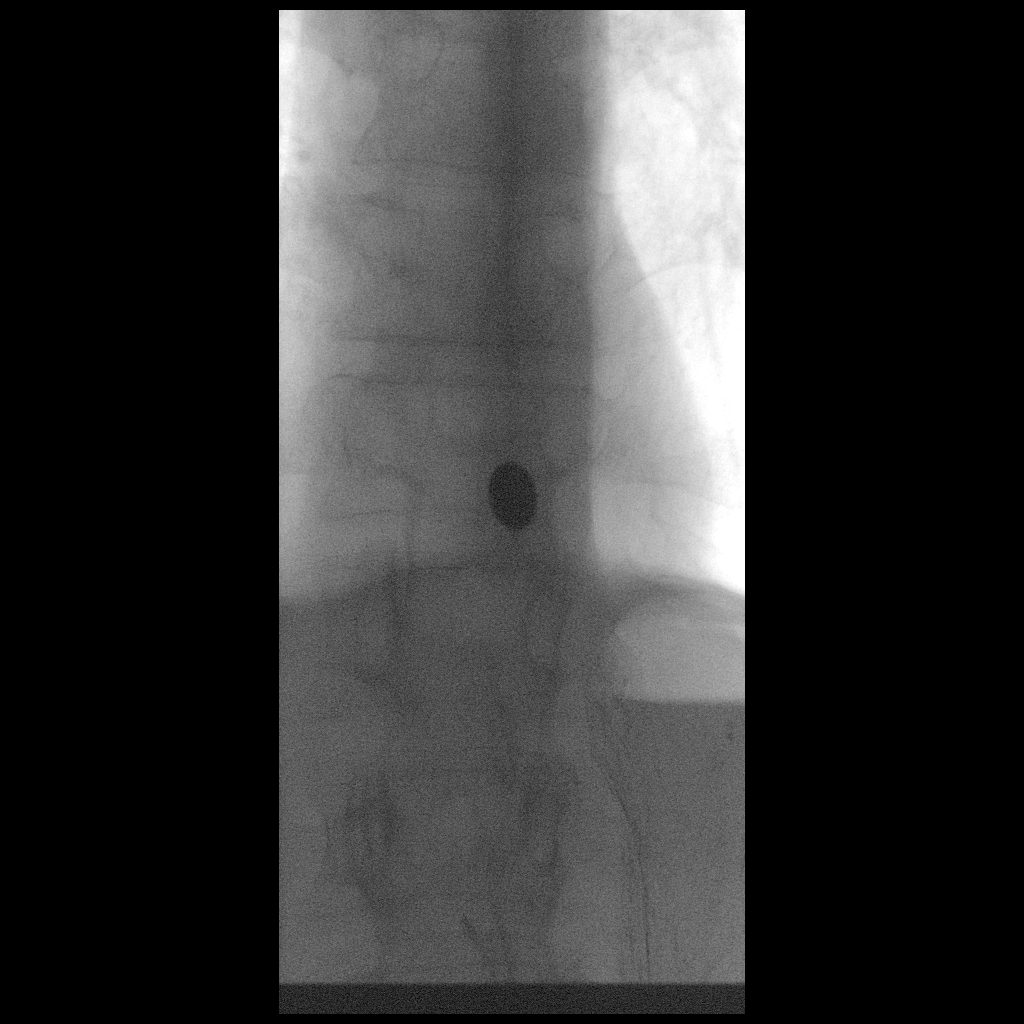

[16 of 24 positions shown; findings below may reference images not displayed]

FINDINGS: Swallowing: Prominent cricopharyngeus, otherwise unremarkable.

Esophagus: Due to dysmotility we were unable to substantially
distend the esophagus, especially the distal esophagus. Because of
this, excluding the possibility of smooth stricturing or narrowing
in the esophagus is difficult in today's case. No esophageal luminal
irregularity is observed. No obvious focal narrowing seen.

Esophageal motility: Disruption of primary peristalsis on almost all
swallows, with multiple episodes of to and fro motion of barium in
the esophagus.

Hiatal Hernia: None.

Gastroesophageal reflux: None visualized.

Ingested 13mm barium tablet: Extended delay of transit in the distal
esophagus, with uncertainty as to whether this is due to an actual
narrowing of the esophagus or simply due to the substantial
dysmotility.

Other: None.
IMPRESSION: 1. Disruption of primary peristaltic waves on almost all swallows,
with to and fro motion of barium in the esophagus, compatible with
nonspecific esophageal dysmotility disorder.
2. The degree of dysmotility matted difficult to distend the
esophagus, especially distally, and accordingly smooth stricturing
in the distal half of the esophagus cannot be totally excluded.
There is no luminal irregularity in the esophagus. Extended delay of
transit of the 13 mm barium tablet in the distal esophagus may be
from dysmotility although again, smooth narrowing cannot be
completely excluded as a possibility.

## 2024-07-20 DEATH — deceased
